# Patient Record
Sex: Female | Born: 1935 | Race: White
Health system: Southern US, Community
[De-identification: ages and names within clinical notes are randomized; demographics above are authoritative.]

## PROBLEM LIST (undated history)

## (undated) DIAGNOSIS — E039 Hypothyroidism, unspecified: Secondary | ICD-10-CM

## (undated) DIAGNOSIS — M1990 Unspecified osteoarthritis, unspecified site: Secondary | ICD-10-CM

## (undated) DIAGNOSIS — M5136 Other intervertebral disc degeneration, lumbar region: Secondary | ICD-10-CM

## (undated) DIAGNOSIS — M19041 Primary osteoarthritis, right hand: Secondary | ICD-10-CM

## (undated) DIAGNOSIS — M19042 Primary osteoarthritis, left hand: Secondary | ICD-10-CM

## (undated) DIAGNOSIS — H409 Unspecified glaucoma: Secondary | ICD-10-CM

## (undated) DIAGNOSIS — Z889 Allergy status to unspecified drugs, medicaments and biological substances status: Secondary | ICD-10-CM

## (undated) DIAGNOSIS — M170 Bilateral primary osteoarthritis of knee: Secondary | ICD-10-CM

## (undated) DIAGNOSIS — M069 Rheumatoid arthritis, unspecified: Secondary | ICD-10-CM

## (undated) DIAGNOSIS — M19072 Primary osteoarthritis, left ankle and foot: Secondary | ICD-10-CM

## (undated) DIAGNOSIS — M353 Polymyalgia rheumatica: Secondary | ICD-10-CM

## (undated) DIAGNOSIS — M19071 Primary osteoarthritis, right ankle and foot: Secondary | ICD-10-CM

## (undated) DIAGNOSIS — I639 Cerebral infarction, unspecified: Secondary | ICD-10-CM

## (undated) DIAGNOSIS — C44301 Unspecified malignant neoplasm of skin of nose: Secondary | ICD-10-CM

## (undated) DIAGNOSIS — D649 Anemia, unspecified: Secondary | ICD-10-CM

## (undated) DIAGNOSIS — E785 Hyperlipidemia, unspecified: Secondary | ICD-10-CM

## (undated) DIAGNOSIS — G459 Transient cerebral ischemic attack, unspecified: Secondary | ICD-10-CM

## (undated) DIAGNOSIS — M797 Fibromyalgia: Secondary | ICD-10-CM

## (undated) DIAGNOSIS — M810 Age-related osteoporosis without current pathological fracture: Secondary | ICD-10-CM

## (undated) DIAGNOSIS — E079 Disorder of thyroid, unspecified: Secondary | ICD-10-CM

## (undated) HISTORY — PX: CHOLECYSTECTOMY: SHX55

## (undated) HISTORY — PX: COLONOSCOPY: SHX174

## (undated) HISTORY — DX: Primary osteoarthritis, right hand: M19.041

## (undated) HISTORY — DX: Primary osteoarthritis, left ankle and foot: M19.072

## (undated) HISTORY — DX: Rheumatoid arthritis, unspecified: M06.9

## (undated) HISTORY — DX: Unspecified osteoarthritis, unspecified site: M19.90

## (undated) HISTORY — PX: APPENDECTOMY: SHX54

## (undated) HISTORY — DX: Bilateral primary osteoarthritis of knee: M17.0

## (undated) HISTORY — DX: Cerebral infarction, unspecified: I63.9

## (undated) HISTORY — DX: Primary osteoarthritis, right ankle and foot: M19.071

## (undated) HISTORY — DX: Other intervertebral disc degeneration, lumbar region: M51.36

## (undated) HISTORY — PX: OTHER SURGICAL HISTORY: SHX169

## (undated) HISTORY — DX: Primary osteoarthritis, left hand: M19.042

## (undated) HISTORY — DX: Unspecified glaucoma: H40.9

---

## 2004-05-03 ENCOUNTER — Ambulatory Visit (HOSPITAL_COMMUNITY): Admission: RE | Admit: 2004-05-03 | Discharge: 2004-05-03 | Payer: Self-pay | Admitting: Rheumatology

## 2004-11-14 ENCOUNTER — Encounter: Admission: RE | Admit: 2004-11-14 | Discharge: 2004-11-14 | Payer: Self-pay | Admitting: Rheumatology

## 2007-02-26 ENCOUNTER — Emergency Department: Admit: 2007-02-26 | Payer: Self-pay | Source: Emergency Department | Admitting: Emergency Medical Services

## 2011-06-18 ENCOUNTER — Other Ambulatory Visit (HOSPITAL_COMMUNITY): Payer: Self-pay | Admitting: *Deleted

## 2011-06-19 ENCOUNTER — Ambulatory Visit (HOSPITAL_COMMUNITY)
Admission: RE | Admit: 2011-06-19 | Discharge: 2011-06-19 | Disposition: A | Discharge status: Home / Self Care | Payer: Medicare Other | Source: Ambulatory Visit | Attending: Rheumatology | Admitting: Rheumatology

## 2011-06-19 DIAGNOSIS — M810 Age-related osteoporosis without current pathological fracture: Secondary | ICD-10-CM | POA: Insufficient documentation

## 2011-06-19 MED ORDER — ZOLEDRONIC ACID 5 MG/100ML IV SOLN
5.0000 mg | Freq: Once | INTRAVENOUS | Status: AC
  Administered 2011-06-19: 5 mg via INTRAVENOUS
  Filled 2011-06-19 (×2): qty 100

## 2012-08-25 ENCOUNTER — Other Ambulatory Visit (HOSPITAL_COMMUNITY): Payer: Self-pay

## 2012-08-26 ENCOUNTER — Encounter (HOSPITAL_COMMUNITY)
Admission: RE | Admit: 2012-08-26 | Discharge: 2012-08-26 | Disposition: A | Discharge status: Home / Self Care | Payer: Medicare Other | Source: Ambulatory Visit | Attending: Rheumatology | Admitting: Rheumatology

## 2012-08-26 DIAGNOSIS — M810 Age-related osteoporosis without current pathological fracture: Secondary | ICD-10-CM | POA: Insufficient documentation

## 2012-08-26 MED ORDER — ZOLEDRONIC ACID 5 MG/100ML IV SOLN
5.0000 mg | Freq: Once | INTRAVENOUS | Status: AC
Start: 2012-08-26 — End: 2012-08-26
  Administered 2012-08-26: 5 mg via INTRAVENOUS

## 2012-08-26 MED ORDER — ZOLEDRONIC ACID 5 MG/100ML IV SOLN
INTRAVENOUS | Status: AC
  Administered 2012-08-26: 5 mg via INTRAVENOUS
  Filled 2012-08-26: qty 100

## 2014-04-15 DIAGNOSIS — Z79899 Other long term (current) drug therapy: Secondary | ICD-10-CM | POA: Diagnosis not present

## 2014-05-18 DIAGNOSIS — M069 Rheumatoid arthritis, unspecified: Secondary | ICD-10-CM | POA: Diagnosis not present

## 2014-05-18 DIAGNOSIS — M1711 Unilateral primary osteoarthritis, right knee: Secondary | ICD-10-CM | POA: Diagnosis not present

## 2014-05-18 DIAGNOSIS — M353 Polymyalgia rheumatica: Secondary | ICD-10-CM | POA: Diagnosis not present

## 2014-05-18 DIAGNOSIS — M7071 Other bursitis of hip, right hip: Secondary | ICD-10-CM | POA: Diagnosis not present

## 2014-05-23 DIAGNOSIS — R002 Palpitations: Secondary | ICD-10-CM | POA: Diagnosis not present

## 2014-06-10 DIAGNOSIS — M069 Rheumatoid arthritis, unspecified: Secondary | ICD-10-CM | POA: Diagnosis not present

## 2014-06-10 DIAGNOSIS — M7071 Other bursitis of hip, right hip: Secondary | ICD-10-CM | POA: Diagnosis not present

## 2014-06-10 DIAGNOSIS — M353 Polymyalgia rheumatica: Secondary | ICD-10-CM | POA: Diagnosis not present

## 2014-06-10 DIAGNOSIS — R2689 Other abnormalities of gait and mobility: Secondary | ICD-10-CM | POA: Diagnosis not present

## 2014-06-10 DIAGNOSIS — M25461 Effusion, right knee: Secondary | ICD-10-CM | POA: Diagnosis not present

## 2014-06-10 DIAGNOSIS — M6281 Muscle weakness (generalized): Secondary | ICD-10-CM | POA: Diagnosis not present

## 2014-06-13 DIAGNOSIS — R2689 Other abnormalities of gait and mobility: Secondary | ICD-10-CM | POA: Diagnosis not present

## 2014-06-13 DIAGNOSIS — M6281 Muscle weakness (generalized): Secondary | ICD-10-CM | POA: Diagnosis not present

## 2014-06-13 DIAGNOSIS — M353 Polymyalgia rheumatica: Secondary | ICD-10-CM | POA: Diagnosis not present

## 2014-06-13 DIAGNOSIS — M25461 Effusion, right knee: Secondary | ICD-10-CM | POA: Diagnosis not present

## 2014-06-13 DIAGNOSIS — M7071 Other bursitis of hip, right hip: Secondary | ICD-10-CM | POA: Diagnosis not present

## 2014-06-13 DIAGNOSIS — M069 Rheumatoid arthritis, unspecified: Secondary | ICD-10-CM | POA: Diagnosis not present

## 2014-06-15 DIAGNOSIS — M6281 Muscle weakness (generalized): Secondary | ICD-10-CM | POA: Diagnosis not present

## 2014-06-15 DIAGNOSIS — M353 Polymyalgia rheumatica: Secondary | ICD-10-CM | POA: Diagnosis not present

## 2014-06-15 DIAGNOSIS — M069 Rheumatoid arthritis, unspecified: Secondary | ICD-10-CM | POA: Diagnosis not present

## 2014-06-15 DIAGNOSIS — M7071 Other bursitis of hip, right hip: Secondary | ICD-10-CM | POA: Diagnosis not present

## 2014-06-15 DIAGNOSIS — R2689 Other abnormalities of gait and mobility: Secondary | ICD-10-CM | POA: Diagnosis not present

## 2014-06-15 DIAGNOSIS — M25461 Effusion, right knee: Secondary | ICD-10-CM | POA: Diagnosis not present

## 2014-06-20 DIAGNOSIS — M6281 Muscle weakness (generalized): Secondary | ICD-10-CM | POA: Diagnosis not present

## 2014-06-20 DIAGNOSIS — R2689 Other abnormalities of gait and mobility: Secondary | ICD-10-CM | POA: Diagnosis not present

## 2014-06-20 DIAGNOSIS — M25461 Effusion, right knee: Secondary | ICD-10-CM | POA: Diagnosis not present

## 2014-06-20 DIAGNOSIS — M353 Polymyalgia rheumatica: Secondary | ICD-10-CM | POA: Diagnosis not present

## 2014-06-20 DIAGNOSIS — M069 Rheumatoid arthritis, unspecified: Secondary | ICD-10-CM | POA: Diagnosis not present

## 2014-06-20 DIAGNOSIS — M7071 Other bursitis of hip, right hip: Secondary | ICD-10-CM | POA: Diagnosis not present

## 2014-06-22 DIAGNOSIS — M353 Polymyalgia rheumatica: Secondary | ICD-10-CM | POA: Diagnosis not present

## 2014-06-22 DIAGNOSIS — R2689 Other abnormalities of gait and mobility: Secondary | ICD-10-CM | POA: Diagnosis not present

## 2014-06-22 DIAGNOSIS — M7071 Other bursitis of hip, right hip: Secondary | ICD-10-CM | POA: Diagnosis not present

## 2014-06-22 DIAGNOSIS — M25461 Effusion, right knee: Secondary | ICD-10-CM | POA: Diagnosis not present

## 2014-06-22 DIAGNOSIS — M069 Rheumatoid arthritis, unspecified: Secondary | ICD-10-CM | POA: Diagnosis not present

## 2014-06-22 DIAGNOSIS — M6281 Muscle weakness (generalized): Secondary | ICD-10-CM | POA: Diagnosis not present

## 2014-06-27 DIAGNOSIS — M6281 Muscle weakness (generalized): Secondary | ICD-10-CM | POA: Diagnosis not present

## 2014-06-27 DIAGNOSIS — M7071 Other bursitis of hip, right hip: Secondary | ICD-10-CM | POA: Diagnosis not present

## 2014-06-27 DIAGNOSIS — M25461 Effusion, right knee: Secondary | ICD-10-CM | POA: Diagnosis not present

## 2014-06-27 DIAGNOSIS — M069 Rheumatoid arthritis, unspecified: Secondary | ICD-10-CM | POA: Diagnosis not present

## 2014-06-27 DIAGNOSIS — M353 Polymyalgia rheumatica: Secondary | ICD-10-CM | POA: Diagnosis not present

## 2014-06-27 DIAGNOSIS — R2689 Other abnormalities of gait and mobility: Secondary | ICD-10-CM | POA: Diagnosis not present

## 2014-06-29 DIAGNOSIS — M069 Rheumatoid arthritis, unspecified: Secondary | ICD-10-CM | POA: Diagnosis not present

## 2014-06-29 DIAGNOSIS — R2689 Other abnormalities of gait and mobility: Secondary | ICD-10-CM | POA: Diagnosis not present

## 2014-06-29 DIAGNOSIS — M6281 Muscle weakness (generalized): Secondary | ICD-10-CM | POA: Diagnosis not present

## 2014-06-29 DIAGNOSIS — M7071 Other bursitis of hip, right hip: Secondary | ICD-10-CM | POA: Diagnosis not present

## 2014-06-29 DIAGNOSIS — M353 Polymyalgia rheumatica: Secondary | ICD-10-CM | POA: Diagnosis not present

## 2014-06-29 DIAGNOSIS — M25461 Effusion, right knee: Secondary | ICD-10-CM | POA: Diagnosis not present

## 2014-07-04 DIAGNOSIS — M069 Rheumatoid arthritis, unspecified: Secondary | ICD-10-CM | POA: Diagnosis not present

## 2014-07-04 DIAGNOSIS — M2550 Pain in unspecified joint: Secondary | ICD-10-CM | POA: Diagnosis not present

## 2014-07-04 DIAGNOSIS — M353 Polymyalgia rheumatica: Secondary | ICD-10-CM | POA: Diagnosis not present

## 2014-07-04 DIAGNOSIS — R2689 Other abnormalities of gait and mobility: Secondary | ICD-10-CM | POA: Diagnosis not present

## 2014-07-04 DIAGNOSIS — Z79899 Other long term (current) drug therapy: Secondary | ICD-10-CM | POA: Diagnosis not present

## 2014-07-04 DIAGNOSIS — M7071 Other bursitis of hip, right hip: Secondary | ICD-10-CM | POA: Diagnosis not present

## 2014-07-04 DIAGNOSIS — M25461 Effusion, right knee: Secondary | ICD-10-CM | POA: Diagnosis not present

## 2014-07-04 DIAGNOSIS — M6281 Muscle weakness (generalized): Secondary | ICD-10-CM | POA: Diagnosis not present

## 2014-07-06 DIAGNOSIS — M353 Polymyalgia rheumatica: Secondary | ICD-10-CM | POA: Diagnosis not present

## 2014-07-06 DIAGNOSIS — M6281 Muscle weakness (generalized): Secondary | ICD-10-CM | POA: Diagnosis not present

## 2014-07-06 DIAGNOSIS — M069 Rheumatoid arthritis, unspecified: Secondary | ICD-10-CM | POA: Diagnosis not present

## 2014-07-06 DIAGNOSIS — M25461 Effusion, right knee: Secondary | ICD-10-CM | POA: Diagnosis not present

## 2014-07-06 DIAGNOSIS — M7071 Other bursitis of hip, right hip: Secondary | ICD-10-CM | POA: Diagnosis not present

## 2014-07-06 DIAGNOSIS — R2689 Other abnormalities of gait and mobility: Secondary | ICD-10-CM | POA: Diagnosis not present

## 2014-07-07 DIAGNOSIS — M069 Rheumatoid arthritis, unspecified: Secondary | ICD-10-CM | POA: Diagnosis not present

## 2014-07-07 DIAGNOSIS — M7071 Other bursitis of hip, right hip: Secondary | ICD-10-CM | POA: Diagnosis not present

## 2014-07-07 DIAGNOSIS — M353 Polymyalgia rheumatica: Secondary | ICD-10-CM | POA: Diagnosis not present

## 2014-07-07 DIAGNOSIS — M6281 Muscle weakness (generalized): Secondary | ICD-10-CM | POA: Diagnosis not present

## 2014-07-07 DIAGNOSIS — R2689 Other abnormalities of gait and mobility: Secondary | ICD-10-CM | POA: Diagnosis not present

## 2014-07-07 DIAGNOSIS — M25461 Effusion, right knee: Secondary | ICD-10-CM | POA: Diagnosis not present

## 2014-07-13 DIAGNOSIS — M069 Rheumatoid arthritis, unspecified: Secondary | ICD-10-CM | POA: Diagnosis not present

## 2014-07-13 DIAGNOSIS — M6281 Muscle weakness (generalized): Secondary | ICD-10-CM | POA: Diagnosis not present

## 2014-07-13 DIAGNOSIS — R2689 Other abnormalities of gait and mobility: Secondary | ICD-10-CM | POA: Diagnosis not present

## 2014-07-13 DIAGNOSIS — M353 Polymyalgia rheumatica: Secondary | ICD-10-CM | POA: Diagnosis not present

## 2014-07-13 DIAGNOSIS — M7071 Other bursitis of hip, right hip: Secondary | ICD-10-CM | POA: Diagnosis not present

## 2014-07-13 DIAGNOSIS — M25461 Effusion, right knee: Secondary | ICD-10-CM | POA: Diagnosis not present

## 2014-07-15 DIAGNOSIS — M069 Rheumatoid arthritis, unspecified: Secondary | ICD-10-CM | POA: Diagnosis not present

## 2014-07-15 DIAGNOSIS — M7071 Other bursitis of hip, right hip: Secondary | ICD-10-CM | POA: Diagnosis not present

## 2014-07-15 DIAGNOSIS — M25461 Effusion, right knee: Secondary | ICD-10-CM | POA: Diagnosis not present

## 2014-07-15 DIAGNOSIS — R2689 Other abnormalities of gait and mobility: Secondary | ICD-10-CM | POA: Diagnosis not present

## 2014-07-15 DIAGNOSIS — M6281 Muscle weakness (generalized): Secondary | ICD-10-CM | POA: Diagnosis not present

## 2014-07-15 DIAGNOSIS — M353 Polymyalgia rheumatica: Secondary | ICD-10-CM | POA: Diagnosis not present

## 2014-07-20 DIAGNOSIS — Z79899 Other long term (current) drug therapy: Secondary | ICD-10-CM | POA: Diagnosis not present

## 2014-07-20 DIAGNOSIS — M069 Rheumatoid arthritis, unspecified: Secondary | ICD-10-CM | POA: Diagnosis not present

## 2014-08-11 ENCOUNTER — Ambulatory Visit: Payer: Medicare Other | Admitting: Podiatrist

## 2014-08-12 ENCOUNTER — Encounter: Payer: Self-pay | Admitting: Podiatrist

## 2014-08-12 ENCOUNTER — Ambulatory Visit (INDEPENDENT_AMBULATORY_CARE_PROVIDER_SITE_OTHER): Payer: Medicare Other | Admitting: Podiatrist

## 2014-08-12 DIAGNOSIS — M216X9 Other acquired deformities of unspecified foot: Secondary | ICD-10-CM

## 2014-08-12 DIAGNOSIS — M2040 Other hammer toe(s) (acquired), unspecified foot: Secondary | ICD-10-CM

## 2014-08-12 DIAGNOSIS — L603 Nail dystrophy: Secondary | ICD-10-CM | POA: Diagnosis not present

## 2014-08-12 NOTE — Patient Instructions (Signed)
Continue to wear a supportive and comfortable shoe with lots of cushioning in the forefoot for the fat pad atrophy that has occurred.  Hammertoes are from a stronger tendon taking over the strength of a weaker tendon and it appears this is happening with your toes.  They can be corrected surgically if they become very uncomfortable.

## 2014-08-12 NOTE — Progress Notes (Signed)
   Subjective:    Patient ID: Beverly Estrada, female    DOB: 09-09-1929, 81 y.o.   MRN: [id number]  HPI I AM HERE TO HAVE MY FEET CHECKED AND I HAVE PRESSURE POINTS AND MY FEET ARE NUMB AND I HAVE A PLACE ON THE BALL OF MY RIGHT FOOT AND THEY ARE SORE AND THERE IS NO SWELLING OR COLDNESS    Review of Systems  Constitutional: Positive for fatigue.  Gastrointestinal:       TROUBLE HOLDING WATER IF I DON'T HURRY  Hematological: Bruises/bleeds easily.  All other systems reviewed and are negative.      Objective:   Physical Exam   Objective:     Vascular status reveals pedal pulses noted at  2 out of 4 dp and pt bilateral .  Neurological sensation is Decreased to [employer] monofilament bilateral at 3/5 sites bilateral.  Dermatological exam reveals  presence of  A very small discreet lesion is present submet 4 of the right foot. No depth to the lesion is seen. It appears to be a superficial callus.  Hammertoe contracture is also present bilateral.  Fat pad atrophy noted bilateral as well.            Assessment & Plan:  Diabetes with neuropathy, fat pad atrophy, hammertoe deformity, hyperkeratotic lesion right foot.   Plan:  Discussed diabetes and foot care.  Recommended more supportive shoes,  Debridement of lesion accomplished.

## 2014-08-24 DIAGNOSIS — M069 Rheumatoid arthritis, unspecified: Secondary | ICD-10-CM | POA: Diagnosis not present

## 2014-08-24 DIAGNOSIS — M353 Polymyalgia rheumatica: Secondary | ICD-10-CM | POA: Diagnosis not present

## 2014-08-24 DIAGNOSIS — Z79899 Other long term (current) drug therapy: Secondary | ICD-10-CM | POA: Diagnosis not present

## 2014-08-24 DIAGNOSIS — R5383 Other fatigue: Secondary | ICD-10-CM | POA: Diagnosis not present

## 2014-09-01 DIAGNOSIS — R5381 Other malaise: Secondary | ICD-10-CM | POA: Diagnosis not present

## 2014-09-01 DIAGNOSIS — Z79899 Other long term (current) drug therapy: Secondary | ICD-10-CM | POA: Diagnosis not present

## 2014-09-01 DIAGNOSIS — M2550 Pain in unspecified joint: Secondary | ICD-10-CM | POA: Diagnosis not present

## 2014-09-01 DIAGNOSIS — E559 Vitamin D deficiency, unspecified: Secondary | ICD-10-CM | POA: Diagnosis not present

## 2014-09-13 DIAGNOSIS — F339 Major depressive disorder, recurrent, unspecified: Secondary | ICD-10-CM | POA: Diagnosis not present

## 2014-09-13 DIAGNOSIS — F5104 Psychophysiologic insomnia: Secondary | ICD-10-CM | POA: Diagnosis not present

## 2014-09-13 DIAGNOSIS — E785 Hyperlipidemia, unspecified: Secondary | ICD-10-CM | POA: Diagnosis not present

## 2014-09-13 DIAGNOSIS — R32 Unspecified urinary incontinence: Secondary | ICD-10-CM | POA: Diagnosis not present

## 2014-09-13 DIAGNOSIS — M0600 Rheumatoid arthritis without rheumatoid factor, unspecified site: Secondary | ICD-10-CM | POA: Diagnosis not present

## 2014-09-13 DIAGNOSIS — K635 Polyp of colon: Secondary | ICD-10-CM | POA: Diagnosis not present

## 2014-09-13 DIAGNOSIS — E119 Type 2 diabetes mellitus without complications: Secondary | ICD-10-CM | POA: Diagnosis not present

## 2014-09-13 DIAGNOSIS — N183 Chronic kidney disease, stage 3 (moderate): Secondary | ICD-10-CM | POA: Diagnosis not present

## 2014-09-13 DIAGNOSIS — Z23 Encounter for immunization: Secondary | ICD-10-CM | POA: Diagnosis not present

## 2014-09-13 DIAGNOSIS — E039 Hypothyroidism, unspecified: Secondary | ICD-10-CM | POA: Diagnosis not present

## 2014-09-13 DIAGNOSIS — M353 Polymyalgia rheumatica: Secondary | ICD-10-CM | POA: Diagnosis not present

## 2014-09-13 DIAGNOSIS — Z79899 Other long term (current) drug therapy: Secondary | ICD-10-CM | POA: Diagnosis not present

## 2014-09-13 DIAGNOSIS — L900 Lichen sclerosus et atrophicus: Secondary | ICD-10-CM | POA: Diagnosis not present

## 2014-10-11 DIAGNOSIS — M2550 Pain in unspecified joint: Secondary | ICD-10-CM | POA: Diagnosis not present

## 2014-10-11 DIAGNOSIS — Z9225 Personal history of immunosupression therapy: Secondary | ICD-10-CM | POA: Diagnosis not present

## 2014-11-23 DIAGNOSIS — M069 Rheumatoid arthritis, unspecified: Secondary | ICD-10-CM | POA: Diagnosis not present

## 2014-11-23 DIAGNOSIS — Z09 Encounter for follow-up examination after completed treatment for conditions other than malignant neoplasm: Secondary | ICD-10-CM | POA: Diagnosis not present

## 2014-11-23 DIAGNOSIS — M25511 Pain in right shoulder: Secondary | ICD-10-CM | POA: Diagnosis not present

## 2014-11-23 DIAGNOSIS — M353 Polymyalgia rheumatica: Secondary | ICD-10-CM | POA: Diagnosis not present

## 2014-11-23 DIAGNOSIS — M1711 Unilateral primary osteoarthritis, right knee: Secondary | ICD-10-CM | POA: Diagnosis not present

## 2014-12-28 DIAGNOSIS — Z79899 Other long term (current) drug therapy: Secondary | ICD-10-CM | POA: Diagnosis not present

## 2015-01-05 DIAGNOSIS — Z1231 Encounter for screening mammogram for malignant neoplasm of breast: Secondary | ICD-10-CM | POA: Diagnosis not present

## 2015-01-18 DIAGNOSIS — Z79899 Other long term (current) drug therapy: Secondary | ICD-10-CM | POA: Diagnosis not present

## 2015-01-18 DIAGNOSIS — M069 Rheumatoid arthritis, unspecified: Secondary | ICD-10-CM | POA: Diagnosis not present

## 2015-02-08 DIAGNOSIS — H903 Sensorineural hearing loss, bilateral: Secondary | ICD-10-CM | POA: Diagnosis not present

## 2015-02-13 DIAGNOSIS — E119 Type 2 diabetes mellitus without complications: Secondary | ICD-10-CM | POA: Diagnosis not present

## 2015-02-13 DIAGNOSIS — N183 Chronic kidney disease, stage 3 (moderate): Secondary | ICD-10-CM | POA: Diagnosis not present

## 2015-02-13 DIAGNOSIS — M353 Polymyalgia rheumatica: Secondary | ICD-10-CM | POA: Diagnosis not present

## 2015-02-13 DIAGNOSIS — F5104 Psychophysiologic insomnia: Secondary | ICD-10-CM | POA: Diagnosis not present

## 2015-02-13 DIAGNOSIS — E785 Hyperlipidemia, unspecified: Secondary | ICD-10-CM | POA: Diagnosis not present

## 2015-02-13 DIAGNOSIS — E039 Hypothyroidism, unspecified: Secondary | ICD-10-CM | POA: Diagnosis not present

## 2015-02-13 DIAGNOSIS — F339 Major depressive disorder, recurrent, unspecified: Secondary | ICD-10-CM | POA: Diagnosis not present

## 2015-02-13 DIAGNOSIS — M0600 Rheumatoid arthritis without rheumatoid factor, unspecified site: Secondary | ICD-10-CM | POA: Diagnosis not present

## 2015-03-12 DIAGNOSIS — R1010 Upper abdominal pain, unspecified: Secondary | ICD-10-CM | POA: Diagnosis not present

## 2015-03-12 DIAGNOSIS — R109 Unspecified abdominal pain: Secondary | ICD-10-CM | POA: Diagnosis not present

## 2015-03-12 DIAGNOSIS — K8041 Calculus of bile duct with cholecystitis, unspecified, with obstruction: Secondary | ICD-10-CM | POA: Diagnosis not present

## 2015-03-12 DIAGNOSIS — R1013 Epigastric pain: Secondary | ICD-10-CM | POA: Diagnosis not present

## 2015-03-13 ENCOUNTER — Encounter (HOSPITAL_COMMUNITY): Payer: Self-pay | Admitting: Internal Medicine

## 2015-03-13 ENCOUNTER — Inpatient Hospital Stay (HOSPITAL_COMMUNITY)
Admission: AD | Admit: 2015-03-13 | Discharge: 2015-03-15 | DRG: 446 | Disposition: A | Discharge status: Home / Self Care | Payer: Medicare Other | Source: Other Acute Inpatient Hospital | Attending: Internal Medicine | Admitting: Internal Medicine

## 2015-03-13 DIAGNOSIS — E039 Hypothyroidism, unspecified: Secondary | ICD-10-CM | POA: Diagnosis present

## 2015-03-13 DIAGNOSIS — R109 Unspecified abdominal pain: Secondary | ICD-10-CM | POA: Diagnosis not present

## 2015-03-13 DIAGNOSIS — Z7952 Long term (current) use of systemic steroids: Secondary | ICD-10-CM

## 2015-03-13 DIAGNOSIS — K8041 Calculus of bile duct with cholecystitis, unspecified, with obstruction: Secondary | ICD-10-CM | POA: Diagnosis not present

## 2015-03-13 DIAGNOSIS — R1011 Right upper quadrant pain: Secondary | ICD-10-CM | POA: Diagnosis not present

## 2015-03-13 DIAGNOSIS — E119 Type 2 diabetes mellitus without complications: Secondary | ICD-10-CM | POA: Diagnosis present

## 2015-03-13 DIAGNOSIS — K8050 Calculus of bile duct without cholangitis or cholecystitis without obstruction: Secondary | ICD-10-CM | POA: Diagnosis not present

## 2015-03-13 DIAGNOSIS — Z86010 Personal history of colonic polyps: Secondary | ICD-10-CM | POA: Diagnosis not present

## 2015-03-13 DIAGNOSIS — R948 Abnormal results of function studies of other organs and systems: Secondary | ICD-10-CM | POA: Diagnosis not present

## 2015-03-13 DIAGNOSIS — Z79899 Other long term (current) drug therapy: Secondary | ICD-10-CM | POA: Diagnosis not present

## 2015-03-13 DIAGNOSIS — M25561 Pain in right knee: Secondary | ICD-10-CM | POA: Diagnosis present

## 2015-03-13 DIAGNOSIS — M25562 Pain in left knee: Secondary | ICD-10-CM | POA: Diagnosis present

## 2015-03-13 DIAGNOSIS — M353 Polymyalgia rheumatica: Secondary | ICD-10-CM | POA: Diagnosis present

## 2015-03-13 DIAGNOSIS — K838 Other specified diseases of biliary tract: Secondary | ICD-10-CM | POA: Diagnosis not present

## 2015-03-13 DIAGNOSIS — Z880 Allergy status to penicillin: Secondary | ICD-10-CM | POA: Diagnosis not present

## 2015-03-13 DIAGNOSIS — Z833 Family history of diabetes mellitus: Secondary | ICD-10-CM

## 2015-03-13 DIAGNOSIS — D649 Anemia, unspecified: Secondary | ICD-10-CM | POA: Diagnosis present

## 2015-03-13 DIAGNOSIS — Z823 Family history of stroke: Secondary | ICD-10-CM

## 2015-03-13 DIAGNOSIS — Z7982 Long term (current) use of aspirin: Secondary | ICD-10-CM | POA: Diagnosis not present

## 2015-03-13 DIAGNOSIS — R932 Abnormal findings on diagnostic imaging of liver and biliary tract: Secondary | ICD-10-CM

## 2015-03-13 DIAGNOSIS — M1990 Unspecified osteoarthritis, unspecified site: Secondary | ICD-10-CM | POA: Diagnosis not present

## 2015-03-13 DIAGNOSIS — Z881 Allergy status to other antibiotic agents status: Secondary | ICD-10-CM | POA: Diagnosis not present

## 2015-03-13 DIAGNOSIS — R1013 Epigastric pain: Secondary | ICD-10-CM | POA: Diagnosis not present

## 2015-03-13 DIAGNOSIS — Z888 Allergy status to other drugs, medicaments and biological substances status: Secondary | ICD-10-CM | POA: Diagnosis not present

## 2015-03-13 HISTORY — DX: Unspecified malignant neoplasm of skin of nose: C44.301

## 2015-03-13 HISTORY — DX: Polymyalgia rheumatica: M35.3

## 2015-03-13 HISTORY — DX: Hypothyroidism, unspecified: E03.9

## 2015-03-13 HISTORY — DX: Allergy status to unspecified drugs, medicaments and biological substances: Z88.9

## 2015-03-13 LAB — BASIC METABOLIC PANEL
Anion gap: 5 (ref 5–15)
BUN: 11 mg/dL (ref 6–20)
CO2: 26 mmol/L (ref 22–32)
Calcium: 8.9 mg/dL (ref 8.9–10.3)
Chloride: 108 mmol/L (ref 101–111)
Creatinine, Ser: 0.85 mg/dL (ref 0.44–1.00)
GFR calc Af Amer: >60 mL/min (ref >60)
GFR calc non Af Amer: >60 mL/min (ref >60)
Glucose, Bld: 99 mg/dL (ref 65–99)
Potassium: 3.7 mmol/L (ref 3.5–5.1)
Sodium: 139 mmol/L (ref 135–145)

## 2015-03-13 LAB — CBC WITH DIFFERENTIAL/PLATELET
Basophils Absolute: 0 10*3/uL (ref 0.0–0.1)
Basophils Relative: 0 %
Eosinophils Absolute: 0 10*3/uL (ref 0.0–0.7)
Eosinophils Relative: 0 %
HCT: 31.1 % — ABNORMAL LOW (ref 36.0–46.0)
Hemoglobin: 10.4 g/dL — ABNORMAL LOW (ref 12.0–15.0)
Lymphocytes Relative: 27 %
Lymphs Abs: 2.2 10*3/uL (ref 0.7–4.0)
MCH: 31.5 pg (ref 26.0–34.0)
MCHC: 33.4 g/dL (ref 30.0–36.0)
MCV: 94.2 fL (ref 78.0–100.0)
Monocytes Absolute: 1.5 10*3/uL — ABNORMAL HIGH (ref 0.1–1.0)
Monocytes Relative: 19 %
Neutro Abs: 4.4 10*3/uL (ref 1.7–7.7)
Neutrophils Relative %: 54 %
Platelets: 158 10*3/uL (ref 150–400)
RBC: 3.3 MIL/uL — ABNORMAL LOW (ref 3.87–5.11)
RDW: 14.8 % (ref 11.5–15.5)
WBC: 8.2 10*3/uL (ref 4.0–10.5)

## 2015-03-13 LAB — TROPONIN I: Troponin I: <0.03 ng/mL (ref <0.031)

## 2015-03-13 LAB — HEPATIC FUNCTION PANEL
ALT: 109 U/L — ABNORMAL HIGH (ref 14–54)
AST: 114 U/L — ABNORMAL HIGH (ref 15–41)
Albumin: 3.3 g/dL — ABNORMAL LOW (ref 3.5–5.0)
Alkaline Phosphatase: 52 U/L (ref 38–126)
Bilirubin, Direct: 0.2 mg/dL (ref 0.1–0.5)
Indirect Bilirubin: 0.9 mg/dL (ref 0.3–0.9)
Total Bilirubin: 1.1 mg/dL (ref 0.3–1.2)
Total Protein: 6 g/dL — ABNORMAL LOW (ref 6.5–8.1)

## 2015-03-13 LAB — LIPASE, BLOOD: Lipase: 26 U/L (ref 11–51)

## 2015-03-13 MED ORDER — ACETAMINOPHEN 650 MG RE SUPP: 650.0000 mg | Freq: Four times a day (QID) | RECTAL | Status: DC | PRN

## 2015-03-13 MED ORDER — ONDANSETRON HCL 4 MG PO TABS: 4.0000 mg | ORAL_TABLET | Freq: Four times a day (QID) | ORAL | Status: DC | PRN

## 2015-03-13 MED ORDER — SODIUM CHLORIDE 0.9 % IV SOLN
500.0000 mg | Freq: Three times a day (TID) | INTRAVENOUS | Status: DC
  Administered 2015-03-13 – 2015-03-14 (×3): 500 mg via INTRAVENOUS
  Filled 2015-03-13 (×5): qty 500

## 2015-03-13 MED ORDER — ACETAMINOPHEN 325 MG PO TABS
650.0000 mg | ORAL_TABLET | Freq: Four times a day (QID) | ORAL | Status: DC | PRN
Start: 2015-03-13 — End: 2015-03-15

## 2015-03-13 MED ORDER — LEVOTHYROXINE SODIUM 100 MCG IV SOLR
66.0000 ug | Freq: Every day | INTRAVENOUS | Status: DC
  Administered 2015-03-13 – 2015-03-15 (×3): 66 ug via INTRAVENOUS
  Filled 2015-03-13 (×3): qty 5

## 2015-03-13 MED ORDER — MORPHINE SULFATE (PF) 2 MG/ML IV SOLN: 1.0000 mg | INTRAVENOUS | Status: DC | PRN

## 2015-03-13 MED ORDER — HYDROCORTISONE NA SUCCINATE PF 100 MG IJ SOLR
50.0000 mg | Freq: Two times a day (BID) | INTRAMUSCULAR | Status: DC
  Administered 2015-03-13 – 2015-03-15 (×4): 50 mg via INTRAVENOUS
  Filled 2015-03-13 (×4): qty 2

## 2015-03-13 MED ORDER — SODIUM CHLORIDE 0.9 % IV SOLN
INTRAVENOUS | Status: AC
  Administered 2015-03-13: 22:00:00 via INTRAVENOUS
  Administered 2015-03-14: 1000 mL via INTRAVENOUS

## 2015-03-13 MED ORDER — ONDANSETRON HCL 4 MG/2ML IJ SOLN
4.0000 mg | Freq: Four times a day (QID) | INTRAMUSCULAR | Status: DC | PRN
Start: 2015-03-13 — End: 2015-03-15

## 2015-03-13 MED ORDER — IMIPENEM-CILASTATIN 500 MG IV SOLR
500.0000 mg | Freq: Once | INTRAVENOUS | Status: DC
  Filled 2015-03-13: qty 500

## 2015-03-13 NOTE — Progress Notes (Signed)
Sign-out  Patient is an 81 y/o female, present to [facility] with complains of abdominal pain. Further work up revealed CBD stone, with labs showing Bili of 1.9. Their GI recommended ERCP which could not be done that that facility for which they have requested transfer to [facility]. They spoke with Dr Carlean Purl of GI who agreed to see patient in consultation. Patient accepted to medsurg.

## 2015-03-13 NOTE — H&P (Addendum)
[facility] History and Physical  Beverly Estrada [id number] DOB: December 28, 1929 DOA: 03/13/2015  Referring physician: Patient was transferred from [facility]. PCP: Beverly Rile, MD  Specialists: None.  Chief Complaint: Abdominal pain.  HPI: Beverly Estrada is a 81 y.o. female with history of hypothyroidism and polymyalgia rheumatica presented to the ER at [facility] because of abdominal pain. Patient presented to the ER yesterday. Patient's pain started 2 days ago. It worsened yesterday. Pain is mostly in the epigastric area and right upper quadrant which became severe and diffuse. Had nausea and fever and chills. CT abdomen and pelvis done in the hospital showed distal common bile duct stone obstructing. LFTs were mildly elevated. CBC showed elevated WBC count. Since patient may require ERCP patient was transferred to [facility] after discussing with gastroenterologist Dr. Carlean Purl. On exam patient states her abdominal pain has largely improved at this time. On exam patient has mild tenderness in the right upper quadrant and epigastric area. Patient has been admitted for further management of choledocholithiasis. CT scan done at [facility] shows 6 mm obstructing stone in the distal CBD with mild pericholecystic fluid. Denies any chest pain or shortness of breath.   Review of Systems: As presented in the history of presenting illness, rest negative.  Past Medical History  Diagnosis Date  . Glaucoma   . Arthritis   . Thyroid disease   . Cancer ([place])   . Polymyalgia rheumatica (HCC)    Past Surgical History  Procedure Laterality Date  . Tonsil surgery    . Appendectomy     Social History:  reports that she has never smoked. She has never used smokeless tobacco. She reports that she does not drink alcohol or use illicit drugs. Where does patient live home. Can patient participate in ADLs? Yes.  Allergies  Allergen Reactions  . Biaxin  [Clarithromycin]   . Clarithromycin Other (See Comments)    Tasted so bad could sleep from swallowing  . Amoxicillin-Pot Clavulanate Rash  . Clindamycin/Lincomycin Rash    Family History:  Family History  Problem Relation Age of Onset  . Diabetes Mellitus II Mother   . Stroke Mother   . Diabetes Mellitus II Brother   . Stroke Brother       Prior to Admission medications   Medication Sig Start Date End Date Taking? Authorizing Provider  BAYER CONTOUR TEST test strip daily. for testing 07/19/14   Historical Provider, MD  hydroxychloroquine (PLAQUENIL) 200 MG tablet  08/01/14   Historical Provider, MD  levothyroxine (SYNTHROID, LEVOTHROID) 112 MCG tablet  08/04/14   Historical Provider, MD  methotrexate (RHEUMATREX) 2.5 MG tablet  07/20/14   Historical Provider, MD  Multiple Vitamin (MULTIVITAMIN) tablet Take 1 tablet by mouth daily.    Historical Provider, MD  predniSONE (DELTASONE) 1 MG tablet  08/01/14   Historical Provider, MD    Physical Exam: Filed Vitals:   03/13/15 1900  BP: 136/48  Pulse: 71  Temp: 99 F (37.2 C)  TempSrc: Oral  Resp: 18  Height: 5\' 5"  (1.651 m)  Weight: 70.398 kg (155 lb 3.2 oz)     General:  Moderately built and nourished.  Eyes: Anicteric no pallor.  ENT: No discharge from ears eyes nose and mouth.  Neck: No mass felt.  Cardiovascular: S1-S2 heard.  Respiratory: No rhonchi or crepitations.  Abdomen: Soft mild tenderness in the right upper quadrant and epigastric area. No guarding or rigidity.  Skin: No rash.  Musculoskeletal: No edema.  Psychiatric: Appears  normal.  Neurologic: Alert awake oriented to time place and person. Moves all extremities.  Labs on Admission:  Basic Metabolic Panel: No results for input(s): NA, K, CL, CO2, GLUCOSE, BUN, CREATININE, CALCIUM, MG, PHOS in the last 168 hours. Liver Function Tests: No results for input(s): AST, ALT, ALKPHOS, BILITOT, PROT, ALBUMIN in the last 168 hours. No results for input(s):  LIPASE, AMYLASE in the last 168 hours. No results for input(s): AMMONIA in the last 168 hours. CBC: No results for input(s): WBC, NEUTROABS, HGB, HCT, MCV, PLT in the last 168 hours. Cardiac Enzymes: No results for input(s): CKTOTAL, CKMB, CKMBINDEX, TROPONINI in the last 168 hours.  BNP (last 3 results) No results for input(s): BNP in the last 8760 hours.  ProBNP (last 3 results) No results for input(s): PROBNP in the last 8760 hours.  CBG: No results for input(s): GLUCAP in the last 168 hours.  Radiological Exams on Admission: No results found.   Assessment/Plan Principal Problem:   Choledocholithiasis Active Problems:   Hypothyroidism   Polymyalgia rheumatica ([place])   1. Choledocholithiasis - CT abdomen and pelvis shows dilated common bile duct at 1.1 cm with 6 mm stone distally at the duodenal ampulla with mild apparent pericholecystic fluid. No definite evidence of acute cholecystitis. At this time since patient has significant leukocytosis but the labs done at [facility] showing WBC of 24,000 and patient stating patient had fever and chills I have placed patient on Primaxin. Dr. Carlean Purl was consulted prior to transfer. Patient will be kept nothing by mouth and IV fluids and pain relieving medications. Further recommendations per gastroenterologist. Since there is pericholecystic fluid I will also order HIDA scan to rule out acute cholecystitis. 2. Polymyalgia rheumatica - since patient has been on long-term steroids have placed patient on IV hydrocortisone for now. 3. Hypothyroidism - IV Synthroid until patient can take orally. 4. History of diabetes mellitus on diet - closely follow CBGs. 5. Normocytic normochromic anemia - follow CBC. 6. Nonspecific scattered hypodensities in the liver which will need further workup as outpatient.  Labs done at [facility] showed WBC of 24.3 hemoglobin of 11.8 platelets of 176 blood chewable 143. Bilirubin 1.3 AST 174 ALT 71  troponin less than 0.01 and alkaline phosphatase 78 creatinine 0.7 urine was negative for nitrites and leukocyte esterase.   DVT Prophylaxis SCDs.  Code Status: Full code.  Family Communication: Discussed with patient.  Disposition Plan: Admit to inpatient.    Kemisha Bonnette N. [facility] Pager [phone].  If 7PM-7AM, please contact night-coverage [email or web address] Password [facility] 03/13/2015, 8:58 PM

## 2015-03-13 NOTE — Progress Notes (Addendum)
ANTIBIOTIC CONSULT NOTE - INITIAL  Pharmacy Consult for imipenem Indication: intra-abdominal infection  Allergies  Allergen Reactions  . Biaxin [Clarithromycin]   . Clarithromycin Other (See Comments)    Tasted Kieran Nachtigal bad could sleep from swallowing  . Amoxicillin-Pot Clavulanate Rash  . Clindamycin/Lincomycin Rash    Patient Measurements: Height: 5\' 5"  (165.1 cm) Weight: 155 lb 3.2 oz (70.398 kg) IBW/kg (Calculated) : 57 Adjusted Body Weight:   Vital Signs: Temp: 99 F (37.2 C) (12/12 1900) Temp Source: Oral (12/12 1900) BP: 136/48 mmHg (12/12 1900) Pulse Rate: 71 (12/12 1900) Intake/Output from previous day:   Intake/Output from this shift:    Labs: No results for input(s): WBC, HGB, PLT, LABCREA, CREATININE in the last 72 hours. CrCl cannot be calculated (Patient has no serum creatinine result on file.). No results for input(s): VANCOTROUGH, VANCOPEAK, VANCORANDOM, GENTTROUGH, GENTPEAK, GENTRANDOM, TOBRATROUGH, TOBRAPEAK, TOBRARND, AMIKACINPEAK, AMIKACINTROU, AMIKACIN in the last 72 hours.   Microbiology: No results found for this or any previous visit (from the past 720 hour(s)).  Medical History: Past Medical History  Diagnosis Date  . Glaucoma   . Arthritis   . Thyroid disease   . Cancer ([place])   . Polymyalgia rheumatica (HCC)     Medications:  Scheduled:  . hydrocortisone sod succinate (SOLU-CORTEF) inj  50 mg Intravenous Q12H  . levothyroxine  66 mcg Intravenous Daily   Infusions:  . sodium chloride     Assessment: 81 yo female with intra-abdominal infection will be started on imipenem.  SCr 0.85 (CrCl ~49)  Goal of Therapy:  Resolution of infection   Plan:  - imipenem 500 mg iv q8h - follow up renal function to decide further dosing  Abbagale Goguen, Tsz-Yin 03/13/2015,9:00 PM

## 2015-03-13 NOTE — Progress Notes (Addendum)
Patient trasfered from [facility] via [place] to room [phone] ; alert and oriented x 4; patient verbalized having sore on right side of abdomen (1/10) ; IV in RAC running fluids at 75cc/hr. Orient patient to room and unit; instructed how to use the call bell and  fall risk precautions. MD notified that patient arrived on the floor. Will continue to monitor the patient.

## 2015-03-14 ENCOUNTER — Inpatient Hospital Stay (HOSPITAL_COMMUNITY): Payer: Medicare Other

## 2015-03-14 ENCOUNTER — Encounter (HOSPITAL_COMMUNITY): Payer: Self-pay | Admitting: Physician Assistant

## 2015-03-14 DIAGNOSIS — K838 Other specified diseases of biliary tract: Secondary | ICD-10-CM

## 2015-03-14 DIAGNOSIS — R932 Abnormal findings on diagnostic imaging of liver and biliary tract: Secondary | ICD-10-CM

## 2015-03-14 LAB — GLUCOSE, CAPILLARY
Glucose-Capillary: 107 mg/dL — ABNORMAL HIGH (ref 65–99)
Glucose-Capillary: 128 mg/dL — ABNORMAL HIGH (ref 65–99)
Glucose-Capillary: 76 mg/dL (ref 65–99)
Glucose-Capillary: 78 mg/dL (ref 65–99)
Glucose-Capillary: 92 mg/dL (ref 65–99)
Glucose-Capillary: 99 mg/dL (ref 65–99)

## 2015-03-14 LAB — HEPATIC FUNCTION PANEL
ALT: 91 U/L — ABNORMAL HIGH (ref 14–54)
AST: 88 U/L — ABNORMAL HIGH (ref 15–41)
Albumin: 3.2 g/dL — ABNORMAL LOW (ref 3.5–5.0)
Alkaline Phosphatase: 49 U/L (ref 38–126)
Bilirubin, Direct: 0.2 mg/dL (ref 0.1–0.5)
Indirect Bilirubin: 0.9 mg/dL (ref 0.3–0.9)
Total Bilirubin: 1.1 mg/dL (ref 0.3–1.2)
Total Protein: 6 g/dL — ABNORMAL LOW (ref 6.5–8.1)

## 2015-03-14 MED ORDER — TECHNETIUM TC 99M MEBROFENIN IV KIT
5.2000 | PACK | Freq: Once | INTRAVENOUS | Status: AC | PRN
  Administered 2015-03-14: 5 via INTRAVENOUS

## 2015-03-14 MED ORDER — GADOBENATE DIMEGLUMINE 529 MG/ML IV SOLN
14.0000 mL | Freq: Once | INTRAVENOUS | Status: AC | PRN
  Administered 2015-03-14: 14 mL via INTRAVENOUS

## 2015-03-14 MED ORDER — ENOXAPARIN SODIUM 40 MG/0.4ML ~~LOC~~ SOLN
40.0000 mg | SUBCUTANEOUS | Status: DC
  Administered 2015-03-14: 40 mg via SUBCUTANEOUS
  Filled 2015-03-14: qty 0.4

## 2015-03-14 NOTE — Progress Notes (Signed)
[facility] PROGRESS NOTE  Beverly Estrada [id number] DOB: [birth year] DOA: 03/13/2015 PCP: Gilford Rile, MD  Length of stay: 1   Assessment/Plan: Principal Problem:   Choledocholithiasis Active Problems:   Hypothyroidism   Polymyalgia rheumatica ([place])    81 y.o. female with history of hypothyroidism and polymyalgia rheumatica presented to the ER at [facility] because of abdominal pain. Patient presented to the ER yesterday. Patient's pain started 2 days ago. It worsened yesterday. Pain is mostly in the epigastric area and right upper quadrant which became severe and diffuse. Had nausea and fever and chills. CT abdomen and pelvis done in the hospital showed distal common bile duct stone obstructing. LFTs were mildly elevated. CBC showed elevated WBC count. Since patient may require ERCP patient was transferred to [facility] after discussing with gastroenterologist Dr. Carlean Purl. On exam patient states her abdominal pain has largely improved at this time. On exam patient has mild tenderness in the right upper quadrant and epigastric area. Patient has been admitted for further management of choledocholithiasis. CT scan done at [facility] shows 6 mm obstructing stone in the distal CBD with mild pericholecystic fluid. Denies any chest pain or shortness of breath.    Assessment and plan  1. Choledocholithiasis - CT abdomen and pelvis shows dilated common bile duct at 1.1 cm with 6 mm stone distally at the duodenal ampulla with mild apparent pericholecystic fluid. No definite evidence of acute cholecystitis. At this time since patient has significant leukocytosis but the labs done at [facility] showing WBC of 24,000 and patient stating patient had fever and chills ,on Primaxin. Dr. Carlean Purl was consulted prior to transfer, they want [facility] to consult . NPO and on  IV fluids and pain relieving medications. Further recommendations per gastroenterologist. Since  there is pericholecystic fluid , now has HIDA scan pending  to rule out acute cholecystitis. Liver function improving 2. Polymyalgia rheumatica - since patient has been on long-term steroids have placed patient on IV hydrocortisone for now. 3. Hypothyroidism - IV Synthroid until patient can take orally. 4. History of diabetes mellitus on diet - closely follow CBGs. 5. Normocytic normochromic anemia - follow CBC. 6. Nonspecific scattered hypodensities in the liver which will need further workup as outpatient.  Labs done at [facility] showed WBC of 24.3 hemoglobin of 11.8 platelets of 176 blood chewable 143. Bilirubin 1.3 AST 174 ALT 71 troponin less than 0.01 and alkaline phosphatase 78 creatinine 0.7 urine was negative for nitrites and leukocyte esterase   DVT prophylaxsis lovenox   Code Status:      Code Status Orders        Start     Ordered   03/13/15 2057  Full code   Continuous     03/13/15 2057      Family Communication: Discussed in detail with the patient, all imaging results, lab results explained to the patient   Disposition Plan:  As above      Consultants:  GI   Procedures:  None   Antibiotics: Anti-infectives    Start     Dose/Rate Route Frequency Ordered Stop   03/13/15 2230  imipenem-cilastatin (PRIMAXIN) 500 mg in sodium chloride 0.9 % 100 mL IVPB     500 mg 200 mL/hr over 30 Minutes Intravenous Every 8 hours 03/13/15 2211     03/13/15 2200  imipenem-cilastatin (PRIMAXIN) 500 mg in sodium chloride 0.9 % 100 mL IVPB  Status:  Discontinued  500 mg 200 mL/hr over 30 Minutes Intravenous  Once 03/13/15 2102 03/13/15 2211         HPI/Subjective: " i feel fine". No AP,N,V  Objective: Filed Vitals:   03/13/15 1900 03/13/15 2218 03/14/15 0426 03/14/15 0516  BP: 136/48 138/41  155/53  Pulse: 71 69  66  Temp: 99 F (37.2 C) 98.2 F (36.8 C)  97.9 F (36.6 C)  TempSrc: Oral Oral  Oral  Resp: 18 18  18   Height: 5\' 5"  (1.651 m)      Weight: 70.398 kg (155 lb 3.2 oz)  68.811 kg (151 lb 11.2 oz)   SpO2:  98%  100%    Intake/Output Summary (Last 24 hours) at 03/14/15 1328 Last data filed at 03/14/15 0900  Gross per 24 hour  Intake 853.75 ml  Output      0 ml  Net 853.75 ml    Exam:  General: No acute respiratory distress Lungs: Clear to auscultation bilaterally without wheezes or crackles Cardiovascular: Regular rate and rhythm without murmur gallop or rub normal S1 and S2 Abdomen: Nontender, nondistended, soft, bowel sounds positive, no rebound, no ascites, no appreciable mass Extremities: No significant cyanosis, clubbing, or edema bilateral lower extremities     Data Review   Micro Results No results found for this or any previous visit (from the past 240 hour(s)).  Radiology Reports No results found.   CBC  Recent Labs Lab 03/13/15 2124  WBC 8.2  HGB 10.4*  HCT 31.1*  PLT 158  MCV 94.2  MCH 31.5  MCHC 33.4  RDW 14.8  LYMPHSABS 2.2  MONOABS 1.5*  EOSABS 0.0  BASOSABS 0.0    Chemistries   Recent Labs Lab 03/13/15 2124 03/14/15 0707  NA 139  --   K 3.7  --   CL 108  --   CO2 26  --   GLUCOSE 99  --   BUN 11  --   CREATININE 0.85  --   CALCIUM 8.9  --   AST 114* 88*  ALT 109* 91*  ALKPHOS 52 49  BILITOT 1.1 1.1   ------------------------------------------------------------------------------------------------------------------ estimated creatinine clearance is 48.8 mL/min (by C-G formula based on Cr of 0.85). ------------------------------------------------------------------------------------------------------------------ No results for input(s): HGBA1C in the last 72 hours. ------------------------------------------------------------------------------------------------------------------ No results for input(s): CHOL, HDL, LDLCALC, TRIG, CHOLHDL, LDLDIRECT in the last 72  hours. ------------------------------------------------------------------------------------------------------------------ No results for input(s): TSH, T4TOTAL, T3FREE, THYROIDAB in the last 72 hours.  Invalid input(s): FREET3 ------------------------------------------------------------------------------------------------------------------ No results for input(s): VITAMINB12, FOLATE, FERRITIN, TIBC, IRON, RETICCTPCT in the last 72 hours.  Coagulation profile No results for input(s): INR, PROTIME in the last 168 hours.  No results for input(s): DDIMER in the last 72 hours.  Cardiac Enzymes  Recent Labs Lab 03/13/15 2124  TROPONINI <0.03   ------------------------------------------------------------------------------------------------------------------ Invalid input(s): POCBNP   CBG:  Recent Labs Lab 03/14/15 0014 03/14/15 0425 03/14/15 0817 03/14/15 1140  GLUCAP 92 128* 99 78       Studies: No results found.    No results found for: HGBA1C Lab Results  Component Value Date   CREATININE 0.85 03/13/2015       Scheduled Meds: . hydrocortisone sod succinate (SOLU-CORTEF) inj  50 mg Intravenous Q12H  . imipenem-cilastatin  500 mg Intravenous Q8H  . levothyroxine  66 mcg Intravenous Daily   Continuous Infusions: . sodium chloride 1,000 mL (03/14/15 1210)    Principal Problem:   Choledocholithiasis Active Problems:   Hypothyroidism   Polymyalgia rheumatica (HCC)  Time spent: 45 minutes   [facility] Pager [phone]. If 7PM-7AM, please contact night-coverage at [email or web address], password [facility] 03/14/2015, 1:28 PM  LOS: 1 day

## 2015-03-14 NOTE — Consult Note (Signed)
[facility] Consult: 3:00 PM 03/14/2015  LOS: 1 day    Referring Provider: Dr Allyson Sabal  Primary Care Physician:  Gilford Rile, MD Primary Gastroenterologist:  Althia Forts.  Dr Odie Sera   Reason for Consultation:  Choledocholithiasis.    HPI: Beverly Estrada is a 81 y.o. female.  Generally healthy and active.   Hypothyroidism.  PMR, on Methotrexate and low dose prednisone.  For bilateral knee pain, she takes either 2 Aleve or 400 mg ibuprofen prn daily. She does not take NSAIDs on a daily basis Previous colonoscopy ~ 2013 by Dr Odie Sera in [place]:  Polyps.  Due to age no plans for re-screening. Previous remote EGD.    Transferred from [facility] overnight.  A few episodes of vague epigastric discomfort in past few weeks, she treated with antacids.  Appetite unphased and good.  Late Sunday PM onset of sudden, acute epigastric and RUQ abd pain. Eventually diffuse pain.  + nausea, fever, chills.  Intense pain lasted ~30 minutes but subsided by the time EMT arrived and transport to [place].  CT showed 1.1 cm CBD, suggestion of obstructing CBD stone, mild pericholecystic fluid but no overt cholcystitis, incidental sigmoid diverticulosis.   T bili 1.3.  Alk phos 78.  AST/ALT 174/71.  Lipase 76.    Pt sat all day at [facility] awaiting transfer to an ERCP capable facility. She felt well during the admission, able to toleerate solid food and sxs did not recur.  Arrived at [facility] last night.  LFTs improved this AM.  Hospitalist ordered HIDA scan which is in progress at this time.    Past Medical History  Diagnosis Date  . Glaucoma   . Arthritis   . Hypothyroidism   . Skin cancer of nose   . Polymyalgia rheumatica ([place])   . Multiple drug allergies     to multiple antibiotics.     Past Surgical History    Procedure Laterality Date  . Tonsil surgery    . Appendectomy    . Colonoscopy  ~2013    pooyps removed, one of them large per pt. Polyp pathology unclear. no rescreening study planned due to age    Prior to Admission medications   Medication Sig Start Date End Date Taking? Authorizing Provider  aspirin 81 MG tablet Take 81 mg by mouth daily.   Yes Historical Provider, MD  BAYER CONTOUR TEST test strip daily. for testing 07/19/14  Yes Historical Provider, MD  calcium-vitamin D 250-100 MG-UNIT tablet Take 1 tablet by mouth 2 (two) times daily.   Yes Historical Provider, MD  folic acid (FOLVITE) 1 MG tablet Take 1 mg by mouth daily.   Yes Historical Provider, MD  levothyroxine (SYNTHROID, LEVOTHROID) 112 MCG tablet Take 112 mcg by mouth daily before breakfast.  08/04/14  Yes Historical Provider, MD  methotrexate (RHEUMATREX) 2.5 MG tablet Take 7.5 mg by mouth once a week. Friday 07/20/14  Yes Historical Provider, MD  Multiple Vitamin (MULTIVITAMIN) tablet Take 1 tablet by mouth daily.   Yes Historical Provider, MD  Omega-3 Fatty Acids (FISH OIL) 1000 MG CAPS  Take 1,000 mg by mouth daily.   Yes Historical Provider, MD  predniSONE (DELTASONE) 1 MG tablet Take 2 mg by mouth daily with breakfast.  08/01/14  Yes Historical Provider, MD  vitamin E 100 UNIT capsule Take 100 Units by mouth daily.   Yes Historical Provider, MD    Scheduled Meds: . enoxaparin (LOVENOX) injection  40 mg Subcutaneous Q24H  . hydrocortisone sod succinate (SOLU-CORTEF) inj  50 mg Intravenous Q12H  . imipenem-cilastatin  500 mg Intravenous Q8H  . levothyroxine  66 mcg Intravenous Daily   Infusions: . sodium chloride 1,000 mL (03/14/15 1210)   PRN Meds: acetaminophen **OR** acetaminophen, morphine injection, ondansetron **OR** ondansetron (ZOFRAN) IV   Allergies as of 03/13/2015 - Review Complete 03/13/2015  Allergen Reaction Noted  . Biaxin [clarithromycin]  08/12/2014  . Clarithromycin Other (See Comments)  06/19/2011  . Amoxicillin-pot clavulanate Rash 06/19/2011  . Clindamycin/lincomycin Rash 06/19/2011    Family History  Problem Relation Age of Onset  . Diabetes Mellitus II Mother   . Stroke Mother   . Diabetes Mellitus II Brother   . Stroke Brother     Social History   Social History  . Marital Status: Widowed    Spouse Name: N/A  . Number of Children: N/A  . Years of Education: N/A   Occupational History  . Not on file.   Social History Main Topics  . Smoking status: Never Smoker   . Smokeless tobacco: Never Used  . Alcohol Use: No  . Drug Use: No  . Sexual Activity: Not on file   Other Topics Concern  . Not on file   Social History Narrative    REVIEW OF SYSTEMS: Constitutional:  Active, still drives long distances (to [place] on occasion).  Cleans her house,  ENT:  No nose bleeds Pulm:  No SOB or cough CV:  No palpitations, no LE edema.  GU:  No hematuria, no frequency.  Urine very yellow but not tea colored.  GI:  Per HPI Heme:  No unusual bleeding or bruising.  She is not aware of any prior history of anemia or of being prescribed iron, B12, folate in the past.  Transfusions:  none Neuro:  No headaches, no peripheral tingling or numbness Derm:  No itching, no rash or sores.  Endocrine:  No sweats or chills.  No polyuria or dysuria Immunization:  Not queried.  Travel:  None beyond local counties in last few months.    PHYSICAL EXAM: Vital signs in last 24 hours: Filed Vitals:   03/13/15 2218 03/14/15 0516  BP: 138/41 155/53  Pulse: 69 66  Temp: 98.2 F (36.8 C) 97.9 F (36.6 C)  Resp: 18 18   Wt Readings from Last 3 Encounters:  03/14/15 68.811 kg (151 lb 11.2 oz)  08/26/12 70.308 kg (155 lb)  06/19/11 77.111 kg (170 lb)   General: Pleasant, somewhat pale WF. She appears younger than her stated age Head:  No facial swelling or asymmetry. No signs of head trauma.  Eyes:  No scleral icterus, no conjunctival pallor. EOMI. Ears:  No  obvious hearing deficit  Nose:  No discharge or congestion Mouth:  Oral mucosa is pink, moist and clear. Dentition in good repair. Neck:  No JVD, no masses, no bruits. No thyromegaly Lungs:  Breathing quietly. No cough. Lungs clear to auscultation bilaterally. Heart: RRR. No MRG. S1/S2 audible. Abdomen:  Soft, not distended.  Mild to moderate tenderness in the epigastric and right upper quadrant. No guarding or rebound.  Active bowel sounds. Rectal: Deferred   Musc/Skeltl: As patient was laying on nuclear medicine gantry, did not perform a full joint survey. Extremities:  No pedal edema. Feet are warm.  Neurologic:  Patient is alert. Oriented 3. No tremor. Did not assess limb movement or strength. No gross deficits. Skin:  Not jaundiced. No obvious sores or rashes on incomplete derm survey Tattoos:  None Nodes:  No cervical adenopathy.   Psych:  Pleasant, calm, cooperative. Affect normal   LAB RESULTS:  Recent Labs  03/13/15 2124  WBC 8.2  HGB 10.4*  HCT 31.1*  PLT 158   BMET Lab Results  Component Value Date   NA 139 03/13/2015   K 3.7 03/13/2015   CL 108 03/13/2015   CO2 26 03/13/2015   GLUCOSE 99 03/13/2015   BUN 11 03/13/2015   CREATININE 0.85 03/13/2015   CALCIUM 8.9 03/13/2015   LFT  Recent Labs  03/13/15 2124 03/14/15 0707  PROT 6.0* 6.0*  ALBUMIN 3.3* 3.2*  AST 114* 88*  ALT 109* 91*  ALKPHOS 52 49  BILITOT 1.1 1.1  BILIDIR 0.2 0.2  IBILI 0.9 0.9       Component Value Date/Time   LIPASE 26 03/13/2015 2124     ENDOSCOPIC STUDIES: Per HPI  IMPRESSION:   *  Dilated CBD with possible distal choledocholithiasis.   Transaminases improved, pain resolved.  HIDA scan in process.  Suspect she may have passed the stone.   *  Normocytic anemia.  Colonoscopy by Dr Odie Sera in recent years: polyps.   *  PMR.  On MTX and low dose prednisone.    PLAN:     *  Per Dr. Carlean Purl. He plans to review images with radiologist.  If needed there is a  disc with imaging from [place] in pt chart.  ? Need of ERCP vs MRCP.      Azucena Freed  03/14/2015, 3:00 PM Pager: [place] Attending   I have taken an interval history, reviewed the chart and examined the patient. I agree with the Advanced Practitioner's note, impression and recommendations.    I looked at images of CT scan and there is an 11 mm CBD with some ? Of distal stone though could be edema in ampulla. i think MRCP is next step and have ordered. Her HIDA is negative so can dc Abx and I will.  Will f/u tomorrow. If MRCP shows a stone in duct(s) will need ERCP  Gatha Mayer, MD, [facility] [phone] (pager) [phone] after 5 PM, weekends and holidays  03/14/2015 3:39 PM

## 2015-03-14 NOTE — Care Management Note (Signed)
Case Management Note  Patient Details  Name: Beverly Estrada MRN: [id number] Date of Birth: 05-Mar-1930  Subjective/Objective:                  Date-03-14-15 Initial Assessment Spoke with patient at the bedside Introduced self as case manager and explained role in discharge planning and how to be reached.  Verified patient lives at home alone in [place]. Has daughter in [place] who is on her way. Verified patient anticipates to go home alone at time of discharge.  Patient has DME cane. Expressed potential need for no other DME.  Patient denied  needing help with their medication.  Patient drives  to MD appointments.  Verified patient has PCP Bea Graff. Patient states they currently receive [place] services through no one.    Plan: CM will continue to follow for discharge planning and [facility] resources.   Carles Collet RN BSN CM [phone]   Action/Plan:  Will continue to monitor for [facility] resources.  Expected Discharge Date:                  Expected Discharge Plan:  Home/Self Care  In-House Referral:     Discharge planning Services  CM Consult  Post Acute Care Choice:    Choice offered to:     DME Arranged:    DME Agency:     HH Arranged:    HH Agency:     Status of Service:  In process, will continue to follow  Medicare Important Message Given:    Date Medicare IM Given:    Medicare IM give by:    Date Additional Medicare IM Given:    Additional Medicare Important Message give by:     If discussed at [place] of Stay Meetings, dates discussed:    Additional Comments:  Carles Collet, RN 03/14/2015, 11:43 AM

## 2015-03-15 DIAGNOSIS — K8050 Calculus of bile duct without cholangitis or cholecystitis without obstruction: Principal | ICD-10-CM

## 2015-03-15 LAB — CBC
HCT: 31.6 % — ABNORMAL LOW (ref 36.0–46.0)
Hemoglobin: 10.2 g/dL — ABNORMAL LOW (ref 12.0–15.0)
MCH: 30.4 pg (ref 26.0–34.0)
MCHC: 32.3 g/dL (ref 30.0–36.0)
MCV: 94.3 fL (ref 78.0–100.0)
Platelets: 159 10*3/uL (ref 150–400)
RBC: 3.35 MIL/uL — ABNORMAL LOW (ref 3.87–5.11)
RDW: 14.3 % (ref 11.5–15.5)
WBC: 5.9 10*3/uL (ref 4.0–10.5)

## 2015-03-15 LAB — COMPREHENSIVE METABOLIC PANEL
ALT: 63 U/L — ABNORMAL HIGH (ref 14–54)
AST: 46 U/L — ABNORMAL HIGH (ref 15–41)
Albumin: 3.1 g/dL — ABNORMAL LOW (ref 3.5–5.0)
Alkaline Phosphatase: 44 U/L (ref 38–126)
Anion gap: 9 (ref 5–15)
BUN: 15 mg/dL (ref 6–20)
CO2: 23 mmol/L (ref 22–32)
Calcium: 8.6 mg/dL — ABNORMAL LOW (ref 8.9–10.3)
Chloride: 110 mmol/L (ref 101–111)
Creatinine, Ser: 0.67 mg/dL (ref 0.44–1.00)
GFR calc Af Amer: >60 mL/min (ref >60)
GFR calc non Af Amer: >60 mL/min (ref >60)
Glucose, Bld: 140 mg/dL — ABNORMAL HIGH (ref 65–99)
Potassium: 3.3 mmol/L — ABNORMAL LOW (ref 3.5–5.1)
Sodium: 142 mmol/L (ref 135–145)
Total Bilirubin: 0.8 mg/dL (ref 0.3–1.2)
Total Protein: 6 g/dL — ABNORMAL LOW (ref 6.5–8.1)

## 2015-03-15 LAB — GLUCOSE, CAPILLARY
Glucose-Capillary: 106 mg/dL — ABNORMAL HIGH (ref 65–99)
Glucose-Capillary: 115 mg/dL — ABNORMAL HIGH (ref 65–99)
Glucose-Capillary: 119 mg/dL — ABNORMAL HIGH (ref 65–99)
Glucose-Capillary: 94 mg/dL (ref 65–99)

## 2015-03-15 MED ORDER — LEVOTHYROXINE SODIUM 112 MCG PO TABS: 112.0000 ug | ORAL_TABLET | Freq: Every day | ORAL | Status: DC

## 2015-03-15 NOTE — Progress Notes (Addendum)
   Patient Name: Beverly Estrada Date of Encounter: 03/15/2015, 10:04 AM    Subjective  Feels well, no pain. Hungry   Objective  BP 145/53 mmHg  Pulse 67  Temp(Src) 98.1 F (36.7 C) (Oral)  Resp 18  Ht 5\' 5"  (1.651 m)  Wt 151 lb 0.2 oz (68.5 kg)  BMI 25.13 kg/m2  SpO2 100%  Abd NT   MRCP no gallstones or CBD stones and CBD less dilated  Lab Results  Component Value Date   ALT 63* 03/15/2015   AST 46* 03/15/2015   ALKPHOS 44 03/15/2015   BILITOT 0.8 03/15/2015        Assessment and Plan   Presumed passed CBD stone Doing well Feed and dc today is my recommendation No gallstones so no need for cholecystectomy   PCP can f/u LFT's next month  Thanks    Gatha Mayer, MD, [facility] [phone] (pager) [phone] after 5 PM, weekends and holidays  03/15/2015 10:04 AM

## 2015-03-15 NOTE — Progress Notes (Signed)
Nsg Discharge Note  Admit Date:  03/13/2015 Discharge date: 03/15/2015   Beverly Estrada to be D/C'd Home per MD order.  AVS completed.  Copy for chart, and copy for patient signed, and dated. Patient/caregiver able to verbalize understanding.  Discharge Medication:   Medication List    TAKE these medications        aspirin 81 MG tablet  Take 81 mg by mouth daily.     BAYER CONTOUR TEST test strip  Generic drug:  glucose blood  daily. for testing     calcium-vitamin D 250-100 MG-UNIT tablet  Take 1 tablet by mouth 2 (two) times daily.     Fish Oil 1000 MG Caps  Take 1,000 mg by mouth daily.     folic acid 1 MG tablet  Commonly known as:  FOLVITE  Take 1 mg by mouth daily.     levothyroxine 112 MCG tablet  Commonly known as:  SYNTHROID, LEVOTHROID  Take 112 mcg by mouth daily before breakfast.     methotrexate 2.5 MG tablet  Commonly known as:  RHEUMATREX  Take 7.5 mg by mouth once a week. Friday     multivitamin tablet  Take 1 tablet by mouth daily.     predniSONE 1 MG tablet  Commonly known as:  DELTASONE  Take 2 mg by mouth daily with breakfast.     vitamin E 100 UNIT capsule  Take 100 Units by mouth daily.        Discharge Assessment: Filed Vitals:   03/15/15 0618 03/15/15 1421  BP: 145/53 189/58  Pulse: 67 73  Temp: 98.1 F (36.7 C) 97.7 F (36.5 C)  Resp: 18 20   Skin clean, dry and intact without evidence of skin break down, no evidence of skin tears noted. IV catheter discontinued intact. Site without signs and symptoms of complications - no redness or edema noted at insertion site, patient denies c/o pain - only slight tenderness at site.  Dressing with slight pressure applied.  D/c Instructions-Education: Discharge instructions given to patient/family with verbalized understanding. D/c education completed with patient/family including follow up instructions, medication list, d/c activities limitations if indicated, with other d/c  instructions as indicated by MD - patient able to verbalize understanding, all questions fully answered. Patient instructed to return to ED, call 911, or call MD for any changes in condition.  Patient escorted via [place], and D/C home via private auto.  Dayle Points, RN 03/15/2015 3:24 PM

## 2015-03-15 NOTE — Discharge Summary (Signed)
Physician Discharge Summary  Beverly Estrada MRN: [id number] DOB/AGE: 04/10/1929 81 y.o.  PCP: Gilford Rile, MD   Admit date: 03/13/2015 Discharge date: 03/15/2015  Discharge Diagnoses:     Principal Problem:   Choledocholithiasis Active Problems:   Hypothyroidism   Polymyalgia rheumatica ([place])    Follow-up recommendations Follow-up with PCP in 3-5 days , including all  additional recommended appointments as below Follow-up CBC,cmp in 7-10 days       Medication List    TAKE these medications        aspirin 81 MG tablet  Take 81 mg by mouth daily.     BAYER CONTOUR TEST test strip  Generic drug:  glucose blood  daily. for testing     calcium-vitamin D 250-100 MG-UNIT tablet  Take 1 tablet by mouth 2 (two) times daily.     Fish Oil 1000 MG Caps  Take 1,000 mg by mouth daily.     folic acid 1 MG tablet  Commonly known as:  FOLVITE  Take 1 mg by mouth daily.     levothyroxine 112 MCG tablet  Commonly known as:  SYNTHROID, LEVOTHROID  Take 112 mcg by mouth daily before breakfast.     methotrexate 2.5 MG tablet  Commonly known as:  RHEUMATREX  Take 7.5 mg by mouth once a week. Friday     multivitamin tablet  Take 1 tablet by mouth daily.     predniSONE 1 MG tablet  Commonly known as:  DELTASONE  Take 2 mg by mouth daily with breakfast.     vitamin E 100 UNIT capsule  Take 100 Units by mouth daily.         Discharge Condition: *  Discharge Instructions       Discharge Instructions    Diet - low sodium heart healthy    Complete by:  As directed      Increase activity slowly    Complete by:  As directed            Allergies  Allergen Reactions  . Biaxin [Clarithromycin]   . Clarithromycin Other (See Comments)    Tasted so bad could sleep from swallowing  . Amoxicillin-Pot Clavulanate Rash  . Clindamycin/Lincomycin Rash      Disposition: Final discharge disposition not confirmed   Consults:  Gatha Mayer, MD, [facility]  Gastroenterology    Significant Diagnostic Studies:  Nm Hepatobiliary Liver Func  03/14/2015  CLINICAL DATA:  Pericholecystic fluid, possible cholecystitis EXAM: NUCLEAR MEDICINE HEPATOBILIARY IMAGING TECHNIQUE: Sequential images of the abdomen were obtained out to 60 minutes following intravenous administration of radiopharmaceutical. RADIOPHARMACEUTICALS:  5 mCi Tc-69m Choletec IV COMPARISON:  03/12/2015 FINDINGS: There is adequate uptake of radioactive tracer throughout the liver immediately following injection. Visualization the biliary tree is noted at 15 minutes with visualization of the gallbladder at 25 minutes. Progressive filling of the gallbladder is seen. Small bowel activity is noted at 35 minutes. IMPRESSION: Normal uptake and excretion of biliary tracer. No evidence of cystic or common bile duct obstruction Electronically Signed   By: MInez CatalinaM.D.   On: 03/14/2015 14:59   Mr 3d Recon At Scanner  03/14/2015  CLINICAL DATA:  Abdominal/right upper quadrant pain. Nausea with fever and chills. CT demonstrating probable distal bile duct obstructing stone. EXAM: MRI ABDOMEN WITHOUT AND WITH CONTRAST (INCLUDING MRCP) TECHNIQUE: Multiplanar multisequence MR imaging of the abdomen was performed both before and after the administration of intravenous contrast. Heavily T2-weighted images of the biliary and pancreatic  ducts were obtained, and three-dimensional MRCP images were rendered by post processing. CONTRAST:  20m MULTIHANCE GADOBENATE DIMEGLUMINE 529 MG/ML IV SOLN COMPARISON:  CT of 03/12/2015 and nuclear medicine hepatobiliary study of earlier today. FINDINGS: Lower chest: Mild cardiomegaly, without pericardial or pleural effusion. Hepatobiliary: Scattered hepatic cysts. No intrahepatic ductal dilatation. Normal appearance of the gallbladder. Common duct dilatation is decreased. For example, measures maximally 10 mm just above the pancreatic head on coronal image 20/series 3. Compare 11  mm at the same level on the prior CT. Tapers to 5 mm in the region of the pancreatic head on image 20/series 3. Compare 9 mm at the same level on the prior. No evidence of obstructive choledocholithiasis. No definite correlate for the CT abnormality. There is a periampullary duodenal diverticulum which may have distorted anatomy in this region. Pancreas: No dominant pancreatic mass or pancreatic duct dilatation. There is tiny focus of side branch duct ectasia within the body on image 73/series 6, of doubtful clinical significance. No acute pancreatitis. Spleen: Normal in size, without focal abnormality. Adrenals/Urinary Tract: Normal adrenal glands. Normal kidneys, without hydronephrosis. Stomach/Bowel: Proximal gastric underdistention. Otherwise normal stomach and abdominal bowel loops. Vascular/Lymphatic: Advanced aortic atherosclerosis. Prominent gonadal veins, especially on the left. No retroperitoneal or retrocrural adenopathy. Other: No ascites. Musculoskeletal: Mild S-shaped lumbar spine curvature. IMPRESSION: 1. Decrease in mild common duct dilatation for age. No evidence of obstructive stone or mass. The questioned stone in the region of the ampulla on CT may have been artifactual, secondary to ampullary enhancement, or could have represented a stone which has passed in the interval. 2. No evidence of cholecystitis or other acute process in the abdomen. 3. Prominent gonadal veins, as can be seen with pelvic congestion syndrome. Electronically Signed   By: KAbigail MiyamotoM.D.   On: 03/14/2015 20:49   Mr Abd W/wo Cm/mrcp  03/14/2015  CLINICAL DATA:  Abdominal/right upper quadrant pain. Nausea with fever and chills. CT demonstrating probable distal bile duct obstructing stone. EXAM: MRI ABDOMEN WITHOUT AND WITH CONTRAST (INCLUDING MRCP) TECHNIQUE: Multiplanar multisequence MR imaging of the abdomen was performed both before and after the administration of intravenous contrast. Heavily T2-weighted images of  the biliary and pancreatic ducts were obtained, and three-dimensional MRCP images were rendered by post processing. CONTRAST:  154mMULTIHANCE GADOBENATE DIMEGLUMINE 529 MG/ML IV SOLN COMPARISON:  CT of 03/12/2015 and nuclear medicine hepatobiliary study of earlier today. FINDINGS: Lower chest: Mild cardiomegaly, without pericardial or pleural effusion. Hepatobiliary: Scattered hepatic cysts. No intrahepatic ductal dilatation. Normal appearance of the gallbladder. Common duct dilatation is decreased. For example, measures maximally 10 mm just above the pancreatic head on coronal image 20/series 3. Compare 11 mm at the same level on the prior CT. Tapers to 5 mm in the region of the pancreatic head on image 20/series 3. Compare 9 mm at the same level on the prior. No evidence of obstructive choledocholithiasis. No definite correlate for the CT abnormality. There is a periampullary duodenal diverticulum which may have distorted anatomy in this region. Pancreas: No dominant pancreatic mass or pancreatic duct dilatation. There is tiny focus of side branch duct ectasia within the body on image 73/series 6, of doubtful clinical significance. No acute pancreatitis. Spleen: Normal in size, without focal abnormality. Adrenals/Urinary Tract: Normal adrenal glands. Normal kidneys, without hydronephrosis. Stomach/Bowel: Proximal gastric underdistention. Otherwise normal stomach and abdominal bowel loops. Vascular/Lymphatic: Advanced aortic atherosclerosis. Prominent gonadal veins, especially on the left. No retroperitoneal or retrocrural adenopathy. Other: No ascites. Musculoskeletal: Mild S-shaped  lumbar spine curvature. IMPRESSION: 1. Decrease in mild common duct dilatation for age. No evidence of obstructive stone or mass. The questioned stone in the region of the ampulla on CT may have been artifactual, secondary to ampullary enhancement, or could have represented a stone which has passed in the interval. 2. No evidence of  cholecystitis or other acute process in the abdomen. 3. Prominent gonadal veins, as can be seen with pelvic congestion syndrome. Electronically Signed   By: Abigail Miyamoto M.D.   On: 03/14/2015 20:49        Filed Weights   03/13/15 1900 03/14/15 0426 03/15/15 0618  Weight: 70.398 kg (155 lb 3.2 oz) 68.811 kg (151 lb 11.2 oz) 68.5 kg (151 lb 0.2 oz)     Microbiology: No results found for this or any previous visit (from the past 240 hour(s)).     Blood Culture No results found for: SDES, [place], CULT, REPTSTATUS    Labs: Results for orders placed or performed during the hospital encounter of 03/13/15 (from the past 48 hour(s))  Hepatic function panel     Status: Abnormal   Collection Time: 03/13/15  9:24 PM  Result Value Ref Range   Total Protein 6.0 (L) 6.5 - 8.1 g/dL   Albumin 3.3 (L) 3.5 - 5.0 g/dL   AST 114 (H) 15 - 41 U/L   ALT 109 (H) 14 - 54 U/L   Alkaline Phosphatase 52 38 - 126 U/L   Total Bilirubin 1.1 0.3 - 1.2 mg/dL   Bilirubin, Direct 0.2 0.1 - 0.5 mg/dL   Indirect Bilirubin 0.9 0.3 - 0.9 mg/dL  Lipase, blood     Status: None   Collection Time: 03/13/15  9:24 PM  Result Value Ref Range   Lipase 26 11 - 51 U/L  Troponin I     Status: None   Collection Time: 03/13/15  9:24 PM  Result Value Ref Range   Troponin I <0.03 <0.031 ng/mL    Comment:        NO INDICATION OF MYOCARDIAL INJURY.   Basic metabolic panel     Status: None   Collection Time: 03/13/15  9:24 PM  Result Value Ref Range   Sodium 139 135 - 145 mmol/L   Potassium 3.7 3.5 - 5.1 mmol/L   Chloride 108 101 - 111 mmol/L   CO2 26 22 - 32 mmol/L   Glucose, Bld 99 65 - 99 mg/dL   BUN 11 6 - 20 mg/dL   Creatinine, Ser 0.85 0.44 - 1.00 mg/dL   Calcium 8.9 8.9 - 10.3 mg/dL   GFR calc non Af Amer >60 >60 mL/min   GFR calc Af Amer >60 >60 mL/min    Comment: (NOTE) The eGFR has been calculated using the CKD EPI equation. This calculation has not been validated in all clinical  situations. eGFR's persistently <60 mL/min signify possible Chronic Kidney Disease.    Anion gap 5 5 - 15  CBC with Differential/Platelet     Status: Abnormal   Collection Time: 03/13/15  9:24 PM  Result Value Ref Range   WBC 8.2 4.0 - 10.5 K/uL   RBC 3.30 (L) 3.87 - 5.11 MIL/uL   Hemoglobin 10.4 (L) 12.0 - 15.0 g/dL   HCT 31.1 (L) 36.0 - 46.0 %   MCV 94.2 78.0 - 100.0 fL   MCH 31.5 26.0 - 34.0 pg   MCHC 33.4 30.0 - 36.0 g/dL   RDW 14.8 11.5 - 15.5 %   Platelets 158 150 -  400 K/uL   Neutrophils Relative % 54 %   Neutro Abs 4.4 1.7 - 7.7 K/uL   Lymphocytes Relative 27 %   Lymphs Abs 2.2 0.7 - 4.0 K/uL   Monocytes Relative 19 %   Monocytes Absolute 1.5 (H) 0.1 - 1.0 K/uL   Eosinophils Relative 0 %   Eosinophils Absolute 0.0 0.0 - 0.7 K/uL   Basophils Relative 0 %   Basophils Absolute 0.0 0.0 - 0.1 K/uL  Glucose, capillary     Status: None   Collection Time: 03/14/15 12:14 AM  Result Value Ref Range   Glucose-Capillary 92 65 - 99 mg/dL   Comment 1 Notify RN    Comment 2 Document in Chart   Glucose, capillary     Status: Abnormal   Collection Time: 03/14/15  4:25 AM  Result Value Ref Range   Glucose-Capillary 128 (H) 65 - 99 mg/dL   Comment 1 Notify RN    Comment 2 Document in Chart   Hepatic function panel     Status: Abnormal   Collection Time: 03/14/15  7:07 AM  Result Value Ref Range   Total Protein 6.0 (L) 6.5 - 8.1 g/dL   Albumin 3.2 (L) 3.5 - 5.0 g/dL   AST 88 (H) 15 - 41 U/L   ALT 91 (H) 14 - 54 U/L   Alkaline Phosphatase 49 38 - 126 U/L   Total Bilirubin 1.1 0.3 - 1.2 mg/dL   Bilirubin, Direct 0.2 0.1 - 0.5 mg/dL   Indirect Bilirubin 0.9 0.3 - 0.9 mg/dL  Glucose, capillary     Status: None   Collection Time: 03/14/15  8:17 AM  Result Value Ref Range   Glucose-Capillary 99 65 - 99 mg/dL  Glucose, capillary     Status: None   Collection Time: 03/14/15 11:40 AM  Result Value Ref Range   Glucose-Capillary 78 65 - 99 mg/dL  Glucose, capillary     Status:  Abnormal   Collection Time: 03/14/15  5:11 PM  Result Value Ref Range   Glucose-Capillary 107 (H) 65 - 99 mg/dL  Glucose, capillary     Status: None   Collection Time: 03/14/15  9:21 PM  Result Value Ref Range   Glucose-Capillary 76 65 - 99 mg/dL   Comment 1 Notify RN    Comment 2 Document in Chart   Glucose, capillary     Status: None   Collection Time: 03/15/15 12:03 AM  Result Value Ref Range   Glucose-Capillary 94 65 - 99 mg/dL   Comment 1 Notify RN    Comment 2 Document in Chart   Glucose, capillary     Status: Abnormal   Collection Time: 03/15/15  3:56 AM  Result Value Ref Range   Glucose-Capillary 119 (H) 65 - 99 mg/dL   Comment 1 Notify RN    Comment 2 Document in Chart   CBC     Status: Abnormal   Collection Time: 03/15/15  5:47 AM  Result Value Ref Range   WBC 5.9 4.0 - 10.5 K/uL   RBC 3.35 (L) 3.87 - 5.11 MIL/uL   Hemoglobin 10.2 (L) 12.0 - 15.0 g/dL   HCT 31.6 (L) 36.0 - 46.0 %   MCV 94.3 78.0 - 100.0 fL   MCH 30.4 26.0 - 34.0 pg   MCHC 32.3 30.0 - 36.0 g/dL   RDW 14.3 11.5 - 15.5 %   Platelets 159 150 - 400 K/uL  Comprehensive metabolic panel     Status: Abnormal   Collection  Time: 03/15/15  5:47 AM  Result Value Ref Range   Sodium 142 135 - 145 mmol/L   Potassium 3.3 (L) 3.5 - 5.1 mmol/L   Chloride 110 101 - 111 mmol/L   CO2 23 22 - 32 mmol/L   Glucose, Bld 140 (H) 65 - 99 mg/dL   BUN 15 6 - 20 mg/dL   Creatinine, Ser 0.67 0.44 - 1.00 mg/dL   Calcium 8.6 (L) 8.9 - 10.3 mg/dL   Total Protein 6.0 (L) 6.5 - 8.1 g/dL   Albumin 3.1 (L) 3.5 - 5.0 g/dL   AST 46 (H) 15 - 41 U/L   ALT 63 (H) 14 - 54 U/L   Alkaline Phosphatase 44 38 - 126 U/L   Total Bilirubin 0.8 0.3 - 1.2 mg/dL   GFR calc non Af Amer >60 >60 mL/min   GFR calc Af Amer >60 >60 mL/min    Comment: (NOTE) The eGFR has been calculated using the CKD EPI equation. This calculation has not been validated in all clinical situations. eGFR's persistently <60 mL/min signify possible Chronic  Kidney Disease.    Anion gap 9 5 - 15  Glucose, capillary     Status: Abnormal   Collection Time: 03/15/15  8:05 AM  Result Value Ref Range   Glucose-Capillary 115 (H) 65 - 99 mg/dL  Glucose, capillary     Status: Abnormal   Collection Time: 03/15/15 11:51 AM  Result Value Ref Range   Glucose-Capillary 106 (H) 65 - 99 mg/dL     Lipid Panel  No results found for: CHOL, TRIG, HDL, CHOLHDL, VLDL, LDLCALC, LDLDIRECT   No results found for: HGBA1C   Lab Results  Component Value Date   CREATININE 0.67 03/15/2015     81 y.o. female. Generally healthy and active. Hypothyroidism. PMR, on Methotrexate and low dose prednisone. For bilateral knee pain, she takes either 2 Aleve or 400 mg ibuprofen prn daily. She does not take NSAIDs on a daily basis.Previous colonoscopy ~ 2013 by Dr Odie Sera in [place]: Polyps. Due to age no plans for re-screening. Previous remote EGD. Transferred from [place] overnight. A few episodes of vague epigastric discomfort in past few weeks, she treated with antacids. Appetite unphased and good.  Late Sunday PM onset of sudden, acute epigastric and RUQ abd pain. Eventually diffuse pain. + nausea, fever, chills. Intense pain lasted ~30 minutes but subsided by the time EMT arrived and transport to [place]. CT showed 1.1 cm CBD, suggestion of obstructing CBD stone, mild pericholecystic fluid but no overt cholcystitis, incidental sigmoid diverticulosis.  T bili 1.3. Alk phos 78. AST/ALT 174/71. Lipase 76.   Pt sat all day at [facility] awaiting transfer to an ERCP capable facility. She felt well during the admission, able to toleerate solid food and sxs did not recur. Arrived at [facility] last night. LFTs improved this AM.  ordered HIDA scan which was wnl    DisAssessment and plan  1. probale Choledocholithiasis - CT abdomen and pelvis shows dilated common bile duct at 1.1 cm with 6 mm stone distally at the duodenal ampulla with mild apparent  pericholecystic fluid. No definite evidence of acute cholecystitis.   patient had significant leukocytosis  at [facility] showing WBC of 24,000 and patient   had fever and chills ,started empirically on Primaxin. Dr. Carlean Purl was consulted prior to transfer,started on  IV fluids and pain relieving medications. Further recommendations per gastroenterologist. Gi ordered HIDA scan and MRCP results as above, may have passed a stone, diet advance ,  ok to dc from GI standpoint without intervention, repeat LFT's outpt  2. Polymyalgia rheumatica - since patient has been on long-term steroids she was  placed patient on IV hydrocortisone for now. Resume prednisone at DC  3. Hypothyroidism -  Cont Synthroid  . 4. History of diabetes mellitus on diet -  Stable CBGs. 5. Normocytic normochromic anemia -stable  CBC.    Blood pressure 145/53, pulse 67, temperature 98.1 F (36.7 C), temperature source Oral, resp. rate 18, height '5\' 5"'$  (1.651 m), weight 68.5 kg (151 lb 0.2 oz), SpO2 100 %.   General: Pleasant, somewhat pale WF. She appears younger than her stated age Head: No facial swelling or asymmetry. No signs of head trauma.  Eyes: No scleral icterus, no conjunctival pallor. EOMI. Ears: No obvious hearing deficit  Nose: No discharge or congestion Mouth: Oral mucosa is pink, moist and clear. Dentition in good repair. Neck: No JVD, no masses, no bruits. No thyromegaly Lungs: Breathing quietly. No cough. Lungs clear to auscultation bilaterally. Heart: RRR. No MRG. S1/S2 audible. Abdomen: Soft, not distended. Mild to moderate tenderness in the epigastric and right upper quadrant. No guarding or rebound. Active bowel sounds. Rectal: Deferred  Musc/Skeltl: As patient was laying on nuclear medicine gantry, did not perform a full joint survey. Extremities: No pedal edema. Feet are warm.  Neurologic: Patient is alert. Oriented 3. No tremor. Did not assess limb movement or strength. No  gross deficits.   Follow-up Information    Follow up with Gilford Rile, MD. Schedule an appointment as soon as possible for a visit in 3 days.   Specialty:  Internal Medicine   Contact information:   [place] [address] [phone]       Signed: Reyne Dumas 03/15/2015, 1:10 PM        Time spent >45 mins

## 2015-03-20 DIAGNOSIS — N183 Chronic kidney disease, stage 3 (moderate): Secondary | ICD-10-CM | POA: Diagnosis not present

## 2015-03-20 DIAGNOSIS — Z79899 Other long term (current) drug therapy: Secondary | ICD-10-CM | POA: Diagnosis not present

## 2015-03-20 DIAGNOSIS — E119 Type 2 diabetes mellitus without complications: Secondary | ICD-10-CM | POA: Diagnosis not present

## 2015-03-20 DIAGNOSIS — K8020 Calculus of gallbladder without cholecystitis without obstruction: Secondary | ICD-10-CM | POA: Diagnosis not present

## 2015-03-20 DIAGNOSIS — M353 Polymyalgia rheumatica: Secondary | ICD-10-CM | POA: Diagnosis not present

## 2015-04-04 DIAGNOSIS — Z79899 Other long term (current) drug therapy: Secondary | ICD-10-CM | POA: Diagnosis not present

## 2015-04-05 DIAGNOSIS — M25561 Pain in right knee: Secondary | ICD-10-CM | POA: Diagnosis not present

## 2015-04-05 DIAGNOSIS — M79671 Pain in right foot: Secondary | ICD-10-CM | POA: Diagnosis not present

## 2015-04-05 DIAGNOSIS — M0579 Rheumatoid arthritis with rheumatoid factor of multiple sites without organ or systems involvement: Secondary | ICD-10-CM | POA: Diagnosis not present

## 2015-04-05 DIAGNOSIS — Z79899 Other long term (current) drug therapy: Secondary | ICD-10-CM | POA: Diagnosis not present

## 2015-04-05 DIAGNOSIS — M79641 Pain in right hand: Secondary | ICD-10-CM | POA: Diagnosis not present

## 2015-04-05 DIAGNOSIS — M79672 Pain in left foot: Secondary | ICD-10-CM | POA: Diagnosis not present

## 2015-04-05 DIAGNOSIS — M25562 Pain in left knee: Secondary | ICD-10-CM | POA: Diagnosis not present

## 2015-04-05 DIAGNOSIS — M353 Polymyalgia rheumatica: Secondary | ICD-10-CM | POA: Diagnosis not present

## 2015-04-05 DIAGNOSIS — M545 Low back pain: Secondary | ICD-10-CM | POA: Diagnosis not present

## 2015-04-05 DIAGNOSIS — M79642 Pain in left hand: Secondary | ICD-10-CM | POA: Diagnosis not present

## 2015-05-30 DIAGNOSIS — K8020 Calculus of gallbladder without cholecystitis without obstruction: Secondary | ICD-10-CM | POA: Diagnosis not present

## 2015-05-30 DIAGNOSIS — R1011 Right upper quadrant pain: Secondary | ICD-10-CM | POA: Diagnosis not present

## 2015-05-30 DIAGNOSIS — Z79899 Other long term (current) drug therapy: Secondary | ICD-10-CM | POA: Diagnosis not present

## 2015-08-16 DIAGNOSIS — H26493 Other secondary cataract, bilateral: Secondary | ICD-10-CM | POA: Diagnosis not present

## 2015-08-16 DIAGNOSIS — H04123 Dry eye syndrome of bilateral lacrimal glands: Secondary | ICD-10-CM | POA: Diagnosis not present

## 2015-08-16 DIAGNOSIS — H35372 Puckering of macula, left eye: Secondary | ICD-10-CM | POA: Diagnosis not present

## 2015-08-29 DIAGNOSIS — Z79899 Other long term (current) drug therapy: Secondary | ICD-10-CM | POA: Diagnosis not present

## 2015-09-02 DIAGNOSIS — R112 Nausea with vomiting, unspecified: Secondary | ICD-10-CM | POA: Diagnosis not present

## 2015-09-02 DIAGNOSIS — R1011 Right upper quadrant pain: Secondary | ICD-10-CM | POA: Diagnosis not present

## 2015-09-02 DIAGNOSIS — R740 Nonspecific elevation of levels of transaminase and lactic acid dehydrogenase [LDH]: Secondary | ICD-10-CM | POA: Diagnosis not present

## 2015-09-03 DIAGNOSIS — K810 Acute cholecystitis: Secondary | ICD-10-CM | POA: Diagnosis not present

## 2015-09-03 DIAGNOSIS — E039 Hypothyroidism, unspecified: Secondary | ICD-10-CM | POA: Diagnosis not present

## 2015-09-03 DIAGNOSIS — Z9849 Cataract extraction status, unspecified eye: Secondary | ICD-10-CM | POA: Diagnosis not present

## 2015-09-03 DIAGNOSIS — G8929 Other chronic pain: Secondary | ICD-10-CM | POA: Diagnosis present

## 2015-09-03 DIAGNOSIS — K7589 Other specified inflammatory liver diseases: Secondary | ICD-10-CM | POA: Diagnosis not present

## 2015-09-03 DIAGNOSIS — R109 Unspecified abdominal pain: Secondary | ICD-10-CM | POA: Diagnosis not present

## 2015-09-03 DIAGNOSIS — K812 Acute cholecystitis with chronic cholecystitis: Secondary | ICD-10-CM | POA: Diagnosis not present

## 2015-09-03 DIAGNOSIS — K861 Other chronic pancreatitis: Secondary | ICD-10-CM | POA: Diagnosis not present

## 2015-09-03 DIAGNOSIS — Z9089 Acquired absence of other organs: Secondary | ICD-10-CM | POA: Diagnosis not present

## 2015-09-03 DIAGNOSIS — J918 Pleural effusion in other conditions classified elsewhere: Secondary | ICD-10-CM | POA: Diagnosis not present

## 2015-09-03 DIAGNOSIS — Z880 Allergy status to penicillin: Secondary | ICD-10-CM | POA: Diagnosis not present

## 2015-09-03 DIAGNOSIS — M069 Rheumatoid arthritis, unspecified: Secondary | ICD-10-CM | POA: Diagnosis not present

## 2015-09-03 DIAGNOSIS — E119 Type 2 diabetes mellitus without complications: Secondary | ICD-10-CM | POA: Diagnosis not present

## 2015-09-03 DIAGNOSIS — R17 Unspecified jaundice: Secondary | ICD-10-CM | POA: Diagnosis not present

## 2015-09-03 DIAGNOSIS — R945 Abnormal results of liver function studies: Secondary | ICD-10-CM | POA: Diagnosis present

## 2015-09-03 DIAGNOSIS — Z833 Family history of diabetes mellitus: Secondary | ICD-10-CM | POA: Diagnosis not present

## 2015-09-03 DIAGNOSIS — K8020 Calculus of gallbladder without cholecystitis without obstruction: Secondary | ICD-10-CM | POA: Diagnosis not present

## 2015-09-03 DIAGNOSIS — K5900 Constipation, unspecified: Secondary | ICD-10-CM | POA: Diagnosis present

## 2015-09-03 DIAGNOSIS — H9193 Unspecified hearing loss, bilateral: Secondary | ICD-10-CM | POA: Diagnosis present

## 2015-09-03 DIAGNOSIS — D1779 Benign lipomatous neoplasm of other sites: Secondary | ICD-10-CM | POA: Diagnosis not present

## 2015-09-03 DIAGNOSIS — D175 Benign lipomatous neoplasm of intra-abdominal organs: Secondary | ICD-10-CM | POA: Diagnosis not present

## 2015-09-03 DIAGNOSIS — Z881 Allergy status to other antibiotic agents status: Secondary | ICD-10-CM | POA: Diagnosis not present

## 2015-09-03 DIAGNOSIS — Z87891 Personal history of nicotine dependence: Secondary | ICD-10-CM | POA: Diagnosis not present

## 2015-09-03 DIAGNOSIS — R7989 Other specified abnormal findings of blood chemistry: Secondary | ICD-10-CM | POA: Diagnosis not present

## 2015-09-03 DIAGNOSIS — Z85820 Personal history of malignant melanoma of skin: Secondary | ICD-10-CM | POA: Diagnosis not present

## 2015-09-03 DIAGNOSIS — R112 Nausea with vomiting, unspecified: Secondary | ICD-10-CM | POA: Diagnosis not present

## 2015-09-03 DIAGNOSIS — R1084 Generalized abdominal pain: Secondary | ICD-10-CM | POA: Diagnosis not present

## 2015-09-03 DIAGNOSIS — E876 Hypokalemia: Secondary | ICD-10-CM | POA: Diagnosis not present

## 2015-09-03 DIAGNOSIS — R110 Nausea: Secondary | ICD-10-CM | POA: Diagnosis not present

## 2015-09-03 DIAGNOSIS — K8689 Other specified diseases of pancreas: Secondary | ICD-10-CM | POA: Diagnosis not present

## 2015-09-03 DIAGNOSIS — Z9889 Other specified postprocedural states: Secondary | ICD-10-CM | POA: Diagnosis not present

## 2015-09-03 DIAGNOSIS — R740 Nonspecific elevation of levels of transaminase and lactic acid dehydrogenase [LDH]: Secondary | ICD-10-CM | POA: Diagnosis not present

## 2015-09-03 DIAGNOSIS — K5730 Diverticulosis of large intestine without perforation or abscess without bleeding: Secondary | ICD-10-CM | POA: Diagnosis not present

## 2015-09-03 DIAGNOSIS — M353 Polymyalgia rheumatica: Secondary | ICD-10-CM | POA: Diagnosis not present

## 2015-09-03 DIAGNOSIS — R1011 Right upper quadrant pain: Secondary | ICD-10-CM | POA: Diagnosis not present

## 2015-09-14 DIAGNOSIS — R1011 Right upper quadrant pain: Secondary | ICD-10-CM | POA: Diagnosis not present

## 2015-09-14 DIAGNOSIS — Z4802 Encounter for removal of sutures: Secondary | ICD-10-CM | POA: Diagnosis not present

## 2015-09-14 DIAGNOSIS — K811 Chronic cholecystitis: Secondary | ICD-10-CM | POA: Diagnosis not present

## 2015-09-18 DIAGNOSIS — R5381 Other malaise: Secondary | ICD-10-CM | POA: Diagnosis not present

## 2015-09-18 DIAGNOSIS — F330 Major depressive disorder, recurrent, mild: Secondary | ICD-10-CM | POA: Diagnosis not present

## 2015-09-18 DIAGNOSIS — E1122 Type 2 diabetes mellitus with diabetic chronic kidney disease: Secondary | ICD-10-CM | POA: Diagnosis not present

## 2015-09-18 DIAGNOSIS — F5104 Psychophysiologic insomnia: Secondary | ICD-10-CM | POA: Diagnosis not present

## 2015-09-18 DIAGNOSIS — E039 Hypothyroidism, unspecified: Secondary | ICD-10-CM | POA: Diagnosis not present

## 2015-09-18 DIAGNOSIS — N183 Chronic kidney disease, stage 3 (moderate): Secondary | ICD-10-CM | POA: Diagnosis not present

## 2015-09-18 DIAGNOSIS — M0600 Rheumatoid arthritis without rheumatoid factor, unspecified site: Secondary | ICD-10-CM | POA: Diagnosis not present

## 2015-09-18 DIAGNOSIS — N189 Chronic kidney disease, unspecified: Secondary | ICD-10-CM | POA: Diagnosis not present

## 2015-09-18 DIAGNOSIS — Z79899 Other long term (current) drug therapy: Secondary | ICD-10-CM | POA: Diagnosis not present

## 2015-09-18 DIAGNOSIS — R5383 Other fatigue: Secondary | ICD-10-CM | POA: Diagnosis not present

## 2015-09-19 DIAGNOSIS — Z09 Encounter for follow-up examination after completed treatment for conditions other than malignant neoplasm: Secondary | ICD-10-CM | POA: Diagnosis not present

## 2015-09-19 DIAGNOSIS — M174 Other bilateral secondary osteoarthritis of knee: Secondary | ICD-10-CM | POA: Diagnosis not present

## 2015-09-19 DIAGNOSIS — M0579 Rheumatoid arthritis with rheumatoid factor of multiple sites without organ or systems involvement: Secondary | ICD-10-CM | POA: Diagnosis not present

## 2015-09-19 DIAGNOSIS — M25561 Pain in right knee: Secondary | ICD-10-CM | POA: Diagnosis not present

## 2015-09-28 DIAGNOSIS — K644 Residual hemorrhoidal skin tags: Secondary | ICD-10-CM | POA: Diagnosis not present

## 2015-09-28 DIAGNOSIS — K5730 Diverticulosis of large intestine without perforation or abscess without bleeding: Secondary | ICD-10-CM | POA: Diagnosis not present

## 2015-09-28 DIAGNOSIS — K5900 Constipation, unspecified: Secondary | ICD-10-CM | POA: Diagnosis not present

## 2015-11-23 DIAGNOSIS — K5900 Constipation, unspecified: Secondary | ICD-10-CM | POA: Diagnosis not present

## 2015-11-23 DIAGNOSIS — K5730 Diverticulosis of large intestine without perforation or abscess without bleeding: Secondary | ICD-10-CM | POA: Diagnosis not present

## 2015-11-23 DIAGNOSIS — K644 Residual hemorrhoidal skin tags: Secondary | ICD-10-CM | POA: Diagnosis not present

## 2015-11-29 DIAGNOSIS — H3581 Retinal edema: Secondary | ICD-10-CM | POA: Diagnosis not present

## 2015-11-29 DIAGNOSIS — H348192 Central retinal vein occlusion, unspecified eye, stable: Secondary | ICD-10-CM | POA: Diagnosis not present

## 2015-11-30 DIAGNOSIS — H34811 Central retinal vein occlusion, right eye, with macular edema: Secondary | ICD-10-CM | POA: Diagnosis not present

## 2015-12-01 DIAGNOSIS — M25462 Effusion, left knee: Secondary | ICD-10-CM | POA: Diagnosis not present

## 2015-12-01 DIAGNOSIS — R2242 Localized swelling, mass and lump, left lower limb: Secondary | ICD-10-CM | POA: Diagnosis not present

## 2015-12-07 DIAGNOSIS — H35033 Hypertensive retinopathy, bilateral: Secondary | ICD-10-CM | POA: Diagnosis not present

## 2015-12-07 DIAGNOSIS — H35372 Puckering of macula, left eye: Secondary | ICD-10-CM | POA: Diagnosis not present

## 2015-12-07 DIAGNOSIS — H34811 Central retinal vein occlusion, right eye, with macular edema: Secondary | ICD-10-CM | POA: Diagnosis not present

## 2015-12-14 DIAGNOSIS — H34811 Central retinal vein occlusion, right eye, with macular edema: Secondary | ICD-10-CM | POA: Diagnosis not present

## 2015-12-26 DIAGNOSIS — M79662 Pain in left lower leg: Secondary | ICD-10-CM | POA: Diagnosis not present

## 2015-12-26 DIAGNOSIS — R2242 Localized swelling, mass and lump, left lower limb: Secondary | ICD-10-CM | POA: Diagnosis not present

## 2015-12-26 LAB — BASIC METABOLIC PANEL
BUN: 16 mg/dL (ref 4–21)
Creatinine: 0.7 mg/dL (ref 0.5–1.1)
Glucose: 108 mg/dL
Potassium: 4.7 mmol/L (ref 3.4–5.3)
Sodium: 141 mmol/L (ref 137–147)

## 2015-12-26 LAB — CBC AND DIFFERENTIAL
HCT: 37 % (ref 36–46)
Hemoglobin: 12.8 g/dL (ref 12.0–16.0)
Platelets: 204 10*3/uL (ref 150–399)
WBC: 5.4 10^3/mL

## 2015-12-26 LAB — HEPATIC FUNCTION PANEL
ALT: 29 U/L (ref 7–35)
AST: 32 U/L (ref 13–35)
Alkaline Phosphatase: 64 U/L (ref 25–125)
Bilirubin, Total: 0.8 mg/dL

## 2016-01-11 DIAGNOSIS — E1122 Type 2 diabetes mellitus with diabetic chronic kidney disease: Secondary | ICD-10-CM | POA: Diagnosis not present

## 2016-01-11 DIAGNOSIS — H348192 Central retinal vein occlusion, unspecified eye, stable: Secondary | ICD-10-CM | POA: Diagnosis not present

## 2016-01-11 DIAGNOSIS — M353 Polymyalgia rheumatica: Secondary | ICD-10-CM | POA: Diagnosis not present

## 2016-01-11 DIAGNOSIS — F330 Major depressive disorder, recurrent, mild: Secondary | ICD-10-CM | POA: Diagnosis not present

## 2016-01-11 DIAGNOSIS — N183 Chronic kidney disease, stage 3 (moderate): Secondary | ICD-10-CM | POA: Diagnosis not present

## 2016-01-11 DIAGNOSIS — Z79899 Other long term (current) drug therapy: Secondary | ICD-10-CM | POA: Diagnosis not present

## 2016-01-17 DIAGNOSIS — Z1231 Encounter for screening mammogram for malignant neoplasm of breast: Secondary | ICD-10-CM | POA: Diagnosis not present

## 2016-01-24 ENCOUNTER — Ambulatory Visit: Payer: Medicare Other | Admitting: Rheumatology

## 2016-01-25 DIAGNOSIS — H34811 Central retinal vein occlusion, right eye, with macular edema: Secondary | ICD-10-CM | POA: Diagnosis not present

## 2016-01-30 ENCOUNTER — Encounter: Payer: Self-pay | Admitting: *Deleted

## 2016-01-30 DIAGNOSIS — M810 Age-related osteoporosis without current pathological fracture: Secondary | ICD-10-CM | POA: Insufficient documentation

## 2016-01-30 DIAGNOSIS — F32A Depression, unspecified: Secondary | ICD-10-CM | POA: Insufficient documentation

## 2016-01-30 DIAGNOSIS — M51369 Other intervertebral disc degeneration, lumbar region without mention of lumbar back pain or lower extremity pain: Secondary | ICD-10-CM

## 2016-01-30 DIAGNOSIS — M5136 Other intervertebral disc degeneration, lumbar region: Secondary | ICD-10-CM

## 2016-01-30 DIAGNOSIS — M19041 Primary osteoarthritis, right hand: Secondary | ICD-10-CM

## 2016-01-30 DIAGNOSIS — M19071 Primary osteoarthritis, right ankle and foot: Secondary | ICD-10-CM

## 2016-01-30 DIAGNOSIS — M069 Rheumatoid arthritis, unspecified: Secondary | ICD-10-CM

## 2016-01-30 DIAGNOSIS — F329 Major depressive disorder, single episode, unspecified: Secondary | ICD-10-CM | POA: Insufficient documentation

## 2016-01-30 DIAGNOSIS — M19072 Primary osteoarthritis, left ankle and foot: Secondary | ICD-10-CM

## 2016-01-30 DIAGNOSIS — M170 Bilateral primary osteoarthritis of knee: Secondary | ICD-10-CM

## 2016-01-30 DIAGNOSIS — M1611 Unilateral primary osteoarthritis, right hip: Secondary | ICD-10-CM | POA: Insufficient documentation

## 2016-01-30 HISTORY — DX: Other intervertebral disc degeneration, lumbar region: M51.36

## 2016-01-30 HISTORY — DX: Primary osteoarthritis, right hand: M19.041

## 2016-01-30 HISTORY — DX: Other intervertebral disc degeneration, lumbar region without mention of lumbar back pain or lower extremity pain: M51.369

## 2016-01-30 HISTORY — DX: Rheumatoid arthritis, unspecified: M06.9

## 2016-01-30 HISTORY — DX: Primary osteoarthritis, left ankle and foot: M19.071

## 2016-01-30 HISTORY — DX: Bilateral primary osteoarthritis of knee: M17.0

## 2016-01-30 NOTE — Progress Notes (Signed)
*IMAGE* Office Visit Note  Patient: Beverly Estrada             Date of Birth: December 09, 1929           MRN: [id number]             PCP: Gilford Rile, MD Referring: Raina Mina., MD Visit Date: 02/01/2016 Occupation: Retired  Subjective:  Lower back pain.   History of Present Illness: Beverly Estrada is a 82 y.o. female with history of seronegative rheumatoid arthritis and polymyalgia rheumatica she states that she never did stop methotrexate after the last visit she continues to take methotrexate him 3 tablets per week which she is tolerating well along with folic acid. She's on prednisone 2 mg a day which she is tolerating well she denies any muscle weakness or tenderness she does have some discomfort in her right knee joint off and on her main concern is lower back pain currently she states she has some discomfort in the lower back and right SI joint area. She also complains of bilateral trochanteric bursa pain. She has some stiffness in her hands feet and knee joints from osteoarthritis but no joint swelling.  Activities of Daily Living:  Patient reports morning stiffness for minutes.   Patient Reports nocturnal pain.  Difficulty dressing/grooming: Reports Difficulty climbing stairs: Reports Difficulty getting out of chair: Reports Difficulty using hands for taps, buttons, cutlery, and/or writing: Denies   Review of Systems  Constitutional: Positive for fatigue and weakness. Negative for night sweats, weight gain and weight loss.  HENT: Positive for mouth sores. Negative for trouble swallowing, trouble swallowing, mouth dryness and nose dryness.   Eyes: Negative for pain, redness, visual disturbance and dryness.  Respiratory: Negative for cough, shortness of breath and difficulty breathing.   Cardiovascular: Positive for hypertension. Negative for chest pain, palpitations, irregular heartbeat and swelling in legs/feet.  Gastrointestinal: Positive for constipation. Negative for  blood in stool and diarrhea.  Endocrine: Negative for increased urination.  Genitourinary: Negative for vaginal dryness.  Musculoskeletal: Positive for arthralgias, joint pain and morning stiffness. Negative for joint swelling, myalgias, muscle weakness, muscle tenderness and myalgias.  Skin: Negative for color change, rash, hair loss, skin tightness, ulcers and sensitivity to sunlight.  Allergic/Immunologic: Negative for susceptible to infections.  Neurological: Positive for memory loss. Negative for dizziness and night sweats.  Hematological: Negative for swollen glands.  Psychiatric/Behavioral: Positive for sleep disturbance. Negative for depressed mood. The patient is not nervous/anxious.   All other systems reviewed and are negative.   PMFS History:  Patient Active Problem List   Diagnosis Date Noted  . Osteopenia 02/01/2016  . RA (rheumatoid arthritis) ([place]) 01/30/2016  . Osteoarthritis of both knees 01/30/2016  . Osteoarthritis of both feet 01/30/2016  . DDD (degenerative disc disease), lumbar 01/30/2016  . Primary osteoarthritis of right hip 01/30/2016  . Depression 01/30/2016  . Choledocholithiasis 03/13/2015  . Hypothyroidism 03/13/2015  . Polymyalgia rheumatica ([place]) 03/13/2015    Past Medical History:  Diagnosis Date  . Arthritis   . DDD (degenerative disc disease), lumbar 01/30/2016  . Glaucoma   . Hypothyroidism   . Multiple drug allergies    to multiple antibiotics.   . Osteoarthritis of both feet 01/30/2016  . Osteoarthritis of both hands 01/30/2016  . Osteoarthritis of both knees 01/30/2016  . Osteoarthritis of right hand 01/30/2016   moderate  . Polymyalgia rheumatica ([place])   . RA (rheumatoid arthritis) ([place]) 01/30/2016   Sero Negative  . Skin cancer of nose   .  Stroke [facility])     Family History  Problem Relation Age of Onset  . Diabetes Mellitus II Mother   . Stroke Mother   . Diabetes Mellitus II Brother   . Stroke Brother   . Diabetes Sister     Past Surgical History:  Procedure Laterality Date  . APPENDECTOMY    . CHOLECYSTECTOMY    . COLONOSCOPY  ~2013   pooyps removed, one of them large per pt. Polyp pathology unclear. no rescreening study planned due to age  . TONSIL SURGERY     Social History   Social History Narrative  . No narrative on file     Objective: Vital Signs: BP (!) 142/54 (BP Location: Left Arm, Patient Position: Sitting, Cuff Size: Large)   Pulse 67   Resp 14   Ht 5' 5.5" (1.664 m)   Wt 146 lb (66.2 kg)   BMI 23.93 kg/m    Physical Exam  Constitutional: She is oriented to person, place, and time. She appears well-developed and well-nourished.  HENT:  Head: Normocephalic and atraumatic.  Eyes: Conjunctivae and EOM are normal.  Neck: Normal range of motion.  Cardiovascular: Normal rate, regular rhythm, normal heart sounds and intact distal pulses.   Pulmonary/Chest: Effort normal and breath sounds normal.  Abdominal: Soft. Bowel sounds are normal.  Musculoskeletal: Normal range of motion.  Neurological: She is alert and oriented to person, place, and time.  Skin: Skin is warm and dry. Capillary refill takes 2 to 3 seconds.  Psychiatric: She has a normal mood and affect. Her behavior is normal.  Nursing note and vitals reviewed.    Musculoskeletal Exam: C-spine good range of motion she has some thoracic kyphosis, she is some discomfort with range of motion of her lumbar spine. She is some tenderness over right SI joint. And tenderness over bilateral trochanteric bursa. Shoulder joints, elbow joints, wrist joints are good range of motion. She is no MCP joint swelling or tenderness. She has thickening of bilateral PIP/DIP joints. She has good range of motion of bilateral hip joints knee joints, ankle joints, MTPs PIPs DIPs with no synovitis.  CDAI Exam: No CDAI exam completed.    Investigation: Findings:  12/26/2015 CBC normal CMP normal, February 2015 TB: Negative, hepatitis negative, DEXA  November 2016 T score -2.5    Imaging: No results found.  Speciality Comments: No specialty comments available.    Procedures:  No procedures performed Allergies: Biaxin [clarithromycin]; Clarithromycin; Augmentin [amoxicillin-pot clavulanate]; Clindamycin; and Clindamycin/lincomycin   Assessment / Plan: Visit Diagnoses: Rheumatoid arthritis of multiple sites with negative rheumatoid factor She has no synovitis on examination. She will continue methotrexate. High risk medication: Her labs are stable and she will continue to get labs every 3 months and [place] area   Polymyalgia rheumatica : She is on prednisone 2 mg a day. Janeal Holmes advised her to taper to 1 mg if tolerated.  Primary osteoarthritis of both knees: Some discomfort  Primary osteoarthritis of both feet: Proper fitting shoes were discussed. Bilateral trochanteric bursitis: Will refer her to physical therapy  DDD (degenerative disc disease), lumbar: Chronic pain she's been having increased pain recently. I will refer to physical therapy.  Osteopenia: She has history of osteopenia. She is also on long-term prednisone use. Advised her to get bone density with her PCP and treatment can be advised accordingly.  Patient reports recent right eye vision loss due to retinal artery occlusion.      Face-to-face time spent with patient was 30 minutes. 50% of  time was spent in counseling and coordination of care.  Follow-Up Instructions: Return in about 5 months (around 07/01/2016) for Rheumatoid arthritis.     Bo Merino, MD

## 2016-02-01 ENCOUNTER — Encounter: Payer: Self-pay | Admitting: Rheumatology

## 2016-02-01 ENCOUNTER — Ambulatory Visit (INDEPENDENT_AMBULATORY_CARE_PROVIDER_SITE_OTHER): Payer: Medicare Other | Admitting: Rheumatology

## 2016-02-01 VITALS — BP 142/54 | HR 67 | Resp 14 | Ht 65.5 in | Wt 146.0 lb

## 2016-02-01 DIAGNOSIS — Z5181 Encounter for therapeutic drug level monitoring: Secondary | ICD-10-CM

## 2016-02-01 DIAGNOSIS — M19072 Primary osteoarthritis, left ankle and foot: Secondary | ICD-10-CM

## 2016-02-01 DIAGNOSIS — M0609 Rheumatoid arthritis without rheumatoid factor, multiple sites: Secondary | ICD-10-CM | POA: Diagnosis not present

## 2016-02-01 DIAGNOSIS — M810 Age-related osteoporosis without current pathological fracture: Secondary | ICD-10-CM

## 2016-02-01 DIAGNOSIS — M8580 Other specified disorders of bone density and structure, unspecified site: Secondary | ICD-10-CM | POA: Insufficient documentation

## 2016-02-01 DIAGNOSIS — M170 Bilateral primary osteoarthritis of knee: Secondary | ICD-10-CM

## 2016-02-01 DIAGNOSIS — M19071 Primary osteoarthritis, right ankle and foot: Secondary | ICD-10-CM

## 2016-02-01 DIAGNOSIS — F329 Major depressive disorder, single episode, unspecified: Secondary | ICD-10-CM

## 2016-02-01 DIAGNOSIS — M353 Polymyalgia rheumatica: Secondary | ICD-10-CM

## 2016-02-01 DIAGNOSIS — M5136 Other intervertebral disc degeneration, lumbar region: Secondary | ICD-10-CM | POA: Diagnosis not present

## 2016-02-01 DIAGNOSIS — F32A Depression, unspecified: Secondary | ICD-10-CM

## 2016-02-01 DIAGNOSIS — M1611 Unilateral primary osteoarthritis, right hip: Secondary | ICD-10-CM | POA: Diagnosis not present

## 2016-02-01 NOTE — Patient Instructions (Signed)
Standing Labs We placed an order today for your standing lab work.    Please come back and get your standing labs in December and every 3 months  We have open lab Monday through Friday from 8:30-11:30 AM and 1-4 PM at the office of Dr. Tresa Moore, PA.   The office is located at [address] [place], [place], [place] [address] No appointment is necessary.   Labs are drawn by [facility].  You may receive a bill from [facility] for your lab work.     I advised patient to get bone density with her PCP. She will go to physical therapy and [place] no order was given.

## 2016-02-06 ENCOUNTER — Telehealth: Payer: Self-pay | Admitting: Rheumatology

## 2016-02-06 NOTE — Telephone Encounter (Signed)
Beverly Estrada needs a signed physical therapy order for the patient. The patient is being seen tomorrow. They have an order but it is not signed. Please advise.  Fax#[phone]

## 2016-02-06 NOTE — Telephone Encounter (Signed)
It was an Brewing technologist, have faxed with hardcopy signature, thanks

## 2016-02-07 DIAGNOSIS — M545 Low back pain: Secondary | ICD-10-CM | POA: Diagnosis not present

## 2016-02-07 DIAGNOSIS — M25551 Pain in right hip: Secondary | ICD-10-CM | POA: Diagnosis not present

## 2016-02-07 DIAGNOSIS — M25552 Pain in left hip: Secondary | ICD-10-CM | POA: Diagnosis not present

## 2016-02-09 DIAGNOSIS — G453 Amaurosis fugax: Secondary | ICD-10-CM | POA: Diagnosis not present

## 2016-02-12 DIAGNOSIS — M25552 Pain in left hip: Secondary | ICD-10-CM | POA: Diagnosis not present

## 2016-02-12 DIAGNOSIS — M25551 Pain in right hip: Secondary | ICD-10-CM | POA: Diagnosis not present

## 2016-02-12 DIAGNOSIS — M545 Low back pain: Secondary | ICD-10-CM | POA: Diagnosis not present

## 2016-02-15 DIAGNOSIS — M25551 Pain in right hip: Secondary | ICD-10-CM | POA: Diagnosis not present

## 2016-02-15 DIAGNOSIS — M545 Low back pain: Secondary | ICD-10-CM | POA: Diagnosis not present

## 2016-02-15 DIAGNOSIS — M25552 Pain in left hip: Secondary | ICD-10-CM | POA: Diagnosis not present

## 2016-02-19 DIAGNOSIS — E1122 Type 2 diabetes mellitus with diabetic chronic kidney disease: Secondary | ICD-10-CM | POA: Diagnosis not present

## 2016-02-19 DIAGNOSIS — Z79899 Other long term (current) drug therapy: Secondary | ICD-10-CM | POA: Diagnosis not present

## 2016-02-19 DIAGNOSIS — E039 Hypothyroidism, unspecified: Secondary | ICD-10-CM | POA: Diagnosis not present

## 2016-02-19 DIAGNOSIS — M353 Polymyalgia rheumatica: Secondary | ICD-10-CM | POA: Diagnosis not present

## 2016-02-19 DIAGNOSIS — G453 Amaurosis fugax: Secondary | ICD-10-CM | POA: Diagnosis not present

## 2016-02-19 DIAGNOSIS — N183 Chronic kidney disease, stage 3 (moderate): Secondary | ICD-10-CM | POA: Diagnosis not present

## 2016-02-19 DIAGNOSIS — Z780 Asymptomatic menopausal state: Secondary | ICD-10-CM | POA: Diagnosis not present

## 2016-02-21 DIAGNOSIS — G453 Amaurosis fugax: Secondary | ICD-10-CM | POA: Diagnosis not present

## 2016-02-21 DIAGNOSIS — H353132 Nonexudative age-related macular degeneration, bilateral, intermediate dry stage: Secondary | ICD-10-CM | POA: Diagnosis not present

## 2016-02-23 DIAGNOSIS — M545 Low back pain: Secondary | ICD-10-CM | POA: Diagnosis not present

## 2016-02-23 DIAGNOSIS — M25551 Pain in right hip: Secondary | ICD-10-CM | POA: Diagnosis not present

## 2016-02-23 DIAGNOSIS — M25552 Pain in left hip: Secondary | ICD-10-CM | POA: Diagnosis not present

## 2016-02-28 DIAGNOSIS — M25551 Pain in right hip: Secondary | ICD-10-CM | POA: Diagnosis not present

## 2016-02-28 DIAGNOSIS — M25552 Pain in left hip: Secondary | ICD-10-CM | POA: Diagnosis not present

## 2016-02-28 DIAGNOSIS — M545 Low back pain: Secondary | ICD-10-CM | POA: Diagnosis not present

## 2016-02-29 DIAGNOSIS — H34811 Central retinal vein occlusion, right eye, with macular edema: Secondary | ICD-10-CM | POA: Diagnosis not present

## 2016-03-01 DIAGNOSIS — M25552 Pain in left hip: Secondary | ICD-10-CM | POA: Diagnosis not present

## 2016-03-01 DIAGNOSIS — M25551 Pain in right hip: Secondary | ICD-10-CM | POA: Diagnosis not present

## 2016-03-01 DIAGNOSIS — M545 Low back pain: Secondary | ICD-10-CM | POA: Diagnosis not present

## 2016-03-05 DIAGNOSIS — M25551 Pain in right hip: Secondary | ICD-10-CM | POA: Diagnosis not present

## 2016-03-05 DIAGNOSIS — M25552 Pain in left hip: Secondary | ICD-10-CM | POA: Diagnosis not present

## 2016-03-05 DIAGNOSIS — M545 Low back pain: Secondary | ICD-10-CM | POA: Diagnosis not present

## 2016-03-08 DIAGNOSIS — M545 Low back pain: Secondary | ICD-10-CM | POA: Diagnosis not present

## 2016-03-08 DIAGNOSIS — M25551 Pain in right hip: Secondary | ICD-10-CM | POA: Diagnosis not present

## 2016-03-08 DIAGNOSIS — M25552 Pain in left hip: Secondary | ICD-10-CM | POA: Diagnosis not present

## 2016-03-13 DIAGNOSIS — G453 Amaurosis fugax: Secondary | ICD-10-CM | POA: Diagnosis not present

## 2016-03-13 DIAGNOSIS — R609 Edema, unspecified: Secondary | ICD-10-CM | POA: Diagnosis not present

## 2016-03-13 DIAGNOSIS — R5383 Other fatigue: Secondary | ICD-10-CM | POA: Diagnosis not present

## 2016-03-14 DIAGNOSIS — M25552 Pain in left hip: Secondary | ICD-10-CM | POA: Diagnosis not present

## 2016-03-14 DIAGNOSIS — M25551 Pain in right hip: Secondary | ICD-10-CM | POA: Diagnosis not present

## 2016-03-14 DIAGNOSIS — M545 Low back pain: Secondary | ICD-10-CM | POA: Diagnosis not present

## 2016-03-19 DIAGNOSIS — E1122 Type 2 diabetes mellitus with diabetic chronic kidney disease: Secondary | ICD-10-CM | POA: Diagnosis not present

## 2016-03-19 DIAGNOSIS — M353 Polymyalgia rheumatica: Secondary | ICD-10-CM | POA: Diagnosis not present

## 2016-03-19 DIAGNOSIS — Z79899 Other long term (current) drug therapy: Secondary | ICD-10-CM | POA: Diagnosis not present

## 2016-03-19 DIAGNOSIS — E039 Hypothyroidism, unspecified: Secondary | ICD-10-CM | POA: Diagnosis not present

## 2016-03-19 DIAGNOSIS — F330 Major depressive disorder, recurrent, mild: Secondary | ICD-10-CM | POA: Diagnosis not present

## 2016-03-19 DIAGNOSIS — N183 Chronic kidney disease, stage 3 (moderate): Secondary | ICD-10-CM | POA: Diagnosis not present

## 2016-03-19 DIAGNOSIS — E782 Mixed hyperlipidemia: Secondary | ICD-10-CM | POA: Diagnosis not present

## 2016-03-19 DIAGNOSIS — N750 Cyst of Bartholin's gland: Secondary | ICD-10-CM | POA: Diagnosis not present

## 2016-03-20 DIAGNOSIS — M25552 Pain in left hip: Secondary | ICD-10-CM | POA: Diagnosis not present

## 2016-03-20 DIAGNOSIS — M545 Low back pain: Secondary | ICD-10-CM | POA: Diagnosis not present

## 2016-03-20 DIAGNOSIS — M25551 Pain in right hip: Secondary | ICD-10-CM | POA: Diagnosis not present

## 2016-03-27 ENCOUNTER — Other Ambulatory Visit: Payer: Self-pay | Admitting: Rheumatology

## 2016-03-28 ENCOUNTER — Other Ambulatory Visit: Payer: Self-pay | Admitting: *Deleted

## 2016-03-28 DIAGNOSIS — Z79899 Other long term (current) drug therapy: Secondary | ICD-10-CM

## 2016-03-28 NOTE — Telephone Encounter (Signed)
Last Visit: 02/01/16 Next Visit: 07/04/16 Labs: 12/26/15 WNL Patient updating the labs today.  Okay to refill MTX?

## 2016-04-11 ENCOUNTER — Telehealth (INDEPENDENT_AMBULATORY_CARE_PROVIDER_SITE_OTHER): Payer: Self-pay | Admitting: Rheumatology

## 2016-04-11 NOTE — Telephone Encounter (Signed)
Patient is requesting to speak with Beverly Estrada to verify her blood work was okay from her office visit 2 weeks ago.  Cb#: [phone]

## 2016-04-12 DIAGNOSIS — H34811 Central retinal vein occlusion, right eye, with macular edema: Secondary | ICD-10-CM | POA: Diagnosis not present

## 2016-04-12 NOTE — Telephone Encounter (Signed)
Left message for patient to return call to the office. Advised we have not received lab results.

## 2016-04-19 ENCOUNTER — Encounter: Payer: Self-pay | Admitting: Rheumatology

## 2016-04-19 DIAGNOSIS — Z79899 Other long term (current) drug therapy: Secondary | ICD-10-CM | POA: Diagnosis not present

## 2016-04-22 ENCOUNTER — Telehealth: Payer: Self-pay | Admitting: Radiology

## 2016-04-22 ENCOUNTER — Telehealth: Payer: Self-pay | Admitting: Rheumatology

## 2016-04-22 NOTE — Telephone Encounter (Signed)
Patient would like to know lab results from last week.

## 2016-04-22 NOTE — Telephone Encounter (Signed)
I have received the information and called patient to advise labs WNL except for elevated Glucose. She voiced understanding. Labs were sent for scanning

## 2016-04-22 NOTE — Telephone Encounter (Signed)
They may be in a fax pile there are several  Have called patient to ask her to have them resent to my attention. I have asked her to resend to my attention.

## 2016-04-22 NOTE — Telephone Encounter (Signed)
I have finally received the labs/ from [facility] I have advised patient CBC from Dec normal and CMP from Jan was normal except for elevated Glucose. She is very upset she did not get a call sooner. I have apologized, will you call her also ?

## 2016-04-25 ENCOUNTER — Other Ambulatory Visit: Payer: Self-pay | Admitting: Rheumatology

## 2016-04-25 NOTE — Telephone Encounter (Signed)
ok 

## 2016-04-25 NOTE — Telephone Encounter (Signed)
Last Visit: 02/01/16 Next Visit: 07/04/16  Okay to refill prednisone?

## 2016-05-29 DIAGNOSIS — L820 Inflamed seborrheic keratosis: Secondary | ICD-10-CM | POA: Diagnosis not present

## 2016-05-29 DIAGNOSIS — L601 Onycholysis: Secondary | ICD-10-CM | POA: Diagnosis not present

## 2016-05-30 DIAGNOSIS — H34811 Central retinal vein occlusion, right eye, with macular edema: Secondary | ICD-10-CM | POA: Diagnosis not present

## 2016-06-04 ENCOUNTER — Other Ambulatory Visit (INDEPENDENT_AMBULATORY_CARE_PROVIDER_SITE_OTHER): Payer: Self-pay | Admitting: Rheumatology

## 2016-06-04 MED ORDER — FOLIC ACID 1 MG PO TABS: 1.0000 mg | ORAL_TABLET | Freq: Every day | ORAL | 4 refills | Status: DC

## 2016-06-04 NOTE — Telephone Encounter (Signed)
Last Visit: 02/01/16 Next Visit: 07/04/16  Okay to refill Folic Acid?

## 2016-06-04 NOTE — Telephone Encounter (Signed)
[facility] [address] in [place] Requesting refill on folic acid. Cb# [phone]

## 2016-06-13 DIAGNOSIS — E039 Hypothyroidism, unspecified: Secondary | ICD-10-CM | POA: Diagnosis not present

## 2016-06-13 DIAGNOSIS — Z7689 Persons encountering health services in other specified circumstances: Secondary | ICD-10-CM | POA: Diagnosis not present

## 2016-06-20 DIAGNOSIS — H9193 Unspecified hearing loss, bilateral: Secondary | ICD-10-CM | POA: Diagnosis not present

## 2016-06-20 DIAGNOSIS — Z974 Presence of external hearing-aid: Secondary | ICD-10-CM | POA: Diagnosis not present

## 2016-07-01 ENCOUNTER — Other Ambulatory Visit: Payer: Self-pay | Admitting: Rheumatology

## 2016-07-01 DIAGNOSIS — R410 Disorientation, unspecified: Secondary | ICD-10-CM | POA: Diagnosis not present

## 2016-07-01 DIAGNOSIS — N3000 Acute cystitis without hematuria: Secondary | ICD-10-CM | POA: Diagnosis not present

## 2016-07-02 ENCOUNTER — Other Ambulatory Visit: Payer: Self-pay | Admitting: Rheumatology

## 2016-07-02 DIAGNOSIS — Z79899 Other long term (current) drug therapy: Secondary | ICD-10-CM | POA: Insufficient documentation

## 2016-07-02 DIAGNOSIS — Z5181 Encounter for therapeutic drug level monitoring: Secondary | ICD-10-CM | POA: Insufficient documentation

## 2016-07-02 DIAGNOSIS — M7062 Trochanteric bursitis, left hip: Secondary | ICD-10-CM

## 2016-07-02 DIAGNOSIS — M7061 Trochanteric bursitis, right hip: Secondary | ICD-10-CM | POA: Insufficient documentation

## 2016-07-02 MED ORDER — METHOTREXATE 2.5 MG PO TABS: ORAL_TABLET | ORAL | 0 refills | Status: DC

## 2016-07-02 NOTE — Telephone Encounter (Signed)
Labs due now. Ok to give 30 d

## 2016-07-02 NOTE — Telephone Encounter (Signed)
Patient advised we would fill a 30 day supply okay per Dr. Estanislado Pandy. Patient is due for labs. Patient advised and states she will go today to update labs.

## 2016-07-02 NOTE — Progress Notes (Signed)
Office Visit Note  Patient: Beverly Estrada             Date of Birth: 1929-09-07           MRN: [id number]             PCP: Gilford Rile, MD Referring: Raina Mina., MD Visit Date: 07/04/2016 Occupation: @GUAROCC @    Subjective:  Follow-up (states not feeling well today )   History of Present Illness: Beverly Estrada is a 83 y.o. female  Last seen 02/01/2016 for rheumatoid arthritis & PMR   on MTX 3/week ; folic acid 1mg  qd ; prednisone 1 mg daily. (Was on 2 mg and was able to taper downward)  Recently was diagnosed with a UTI. Patient did not have any UTI symptoms but did have fogginess.  Beverly Estrada, FRIEND, ACCOMPANYING PATIENT. Patient states that her daughter asked Altha Harm to calm and accompany her today.      Activities of Daily Living:  Patient reports morning stiffness for 15 minute.   Patient Denies nocturnal pain.  Difficulty dressing/grooming: Denies Difficulty climbing stairs: Denies Difficulty getting out of chair: Denies Difficulty using hands for taps, buttons, cutlery, and/or writing: Denies   Review of Systems  Constitutional: Negative for fatigue.  HENT: Negative for mouth sores and mouth dryness.   Eyes: Negative for dryness.  Respiratory: Negative for shortness of breath.   Gastrointestinal: Negative for constipation and diarrhea.  Musculoskeletal: Negative for myalgias and myalgias.  Skin: Negative for sensitivity to sunlight.  Psychiatric/Behavioral: Negative for decreased concentration and sleep disturbance.    PMFS History:  Patient Active Problem List   Diagnosis Date Noted  . Medication monitoring encounter 07/02/2016  . Trochanteric bursitis of both hips 07/02/2016  . High risk medication use 07/02/2016  . Osteopenia 02/01/2016  . RA (rheumatoid arthritis) ([place]) 01/30/2016  . Osteoarthritis of both knees 01/30/2016  . Osteoarthritis of both feet 01/30/2016  . DDD (degenerative disc disease), lumbar 01/30/2016  . Primary  osteoarthritis of right hip 01/30/2016  . Depression 01/30/2016  . Choledocholithiasis 03/13/2015  . Hypothyroidism 03/13/2015  . Polymyalgia rheumatica ([place]) 03/13/2015    Past Medical History:  Diagnosis Date  . Arthritis   . DDD (degenerative disc disease), lumbar 01/30/2016  . Glaucoma   . Hypothyroidism   . Multiple drug allergies    to multiple antibiotics.   . Osteoarthritis of both feet 01/30/2016  . Osteoarthritis of both hands 01/30/2016  . Osteoarthritis of both knees 01/30/2016  . Osteoarthritis of right hand 01/30/2016   moderate  . Polymyalgia rheumatica ([place])   . RA (rheumatoid arthritis) ([place]) 01/30/2016   Sero Negative  . Skin cancer of nose   . Stroke [facility])     Family History  Problem Relation Age of Onset  . Diabetes Mellitus II Mother   . Stroke Mother   . Diabetes Mellitus II Brother   . Stroke Brother   . Diabetes Sister    Past Surgical History:  Procedure Laterality Date  . APPENDECTOMY    . CHOLECYSTECTOMY    . COLONOSCOPY  ~2013   pooyps removed, one of them large per pt. Polyp pathology unclear. no rescreening study planned due to age  . TONSIL SURGERY     Social History   Social History Narrative  . No narrative on file     Objective: Vital Signs: BP (!) 146/70   Pulse 78   Resp 14   Wt 148 lb (67.1 kg)   BMI 24.25 kg/m  Physical Exam  Constitutional: She is oriented to person, place, and time. She appears well-developed and well-nourished.  HENT:  Head: Normocephalic and atraumatic.  Eyes: EOM are normal. Pupils are equal, round, and reactive to light.  Cardiovascular: Normal rate, regular rhythm and normal heart sounds.  Exam reveals no gallop and no friction rub.   No murmur heard. Pulmonary/Chest: Effort normal and breath sounds normal. She has no wheezes. She has no rales.  Abdominal: Soft. Bowel sounds are normal. She exhibits no distension. There is no tenderness. There is no guarding. No hernia.  Musculoskeletal:  Normal range of motion. She exhibits no edema, tenderness or deformity.  Lymphadenopathy:    She has no cervical adenopathy.  Neurological: She is alert and oriented to person, place, and time. Coordination normal.  Skin: Skin is warm and dry. Capillary refill takes less than 2 seconds. No rash noted.  Psychiatric: She has a normal mood and affect. Her behavior is normal.  Nursing note and vitals reviewed.    Musculoskeletal Exam:  Full range of motion of all joints Grip strength is equal strong bilaterally For myalgia tender points are all absent  CDAI Exam: CDAI Homunculus Exam:   Tenderness:  Right hand: 2nd MCP and 3rd MCP  Swelling:  Right hand: 2nd MCP and 3rd MCP  Joint Counts:  CDAI Tender Joint count: 2 CDAI Swollen Joint count: 2  Global Assessments:  Patient Global Assessment: 1 Provider Global Assessment: 1  CDAI Calculated Score: 6 Right second and third MCP joint with mild synovial thickening with mild tenderness to palpation. Patient rates her discomfort as a 1 on a scale of 0-10. I would like to follow with patient in 3 months to verify that this all resolves   Investigation: Findings:  12/26/2015 CBC normal CMP normal, February 2015 TB: Negative, hepatitis negative, DEXA November 2016 T score -2.5  Advised her to get bone density with her PCP and treatment can be advised accordingly.  Patient reports recent right eye vision loss due to retinal artery occlusion.  Abstract on 01/30/2016  Component Date Value Ref Range Status  . Hemoglobin 12/26/2015 12.8  12.0 - 16.0 g/dL Final  . HCT 12/26/2015 37  36 - 46 % Final  . Platelets 12/26/2015 204  150 - 399 K/L Final  . WBC 12/26/2015 5.4  10^3/mL Final  . Glucose 12/26/2015 108  mg/dL Final  . BUN 12/26/2015 16  4 - 21 mg/dL Final  . Creatinine 12/26/2015 0.7  0.5 - 1.1 mg/dL Final  . Potassium 12/26/2015 4.7  3.4 - 5.3 mmol/L Final  . Sodium 12/26/2015 141  137 - 147 mmol/L Final  . Alkaline  Phosphatase 12/26/2015 64  25 - 125 U/L Final  . ALT 12/26/2015 29  7 - 35 U/L Final  . AST 12/26/2015 32  13 - 35 U/L Final  . Bilirubin, Total 12/26/2015 0.8  mg/dL Final      Imaging: No results found.  Speciality Comments: No specialty comments available.    Procedures:  No procedures performed Allergies: Biaxin [clarithromycin]; Clarithromycin; Augmentin [amoxicillin-pot clavulanate]; Clindamycin; and Clindamycin/lincomycin   Assessment / Plan:     Visit Diagnoses: Rheumatoid arthritis of multiple sites with negative rheumatoid factor (HCC)  High risk medication use - MTX- 2.5mg  tablet  TAKE 3 TABLETS BY MOUTH EVERY WEEKprednisone 2 mg a day - Plan: CBC with Differential/Platelet, COMPLETE METABOLIC PANEL WITH GFR, Urinalysis, Routine w reflex microscopic, Urine Culture  Polymyalgia rheumatica (HCC)  Primary osteoarthritis of both knees  Primary osteoarthritis of both feet  Primary osteoarthritis of right hip  DDD (degenerative disc disease), lumbar  Other depression  Age related osteoporosis, unspecified pathological fracture presence  Medication monitoring encounter  Osteopenia of multiple sites  Trochanteric bursitis of both hips  Acute cystitis without hematuria - Plan: Urinalysis, Routine w reflex microscopic, Urine Culture    Plan: #1: Rheumatoid arthritis. Doing well currently. Taking methotrexate 3 pills per week and folic acid 1 mg daily. Patient is ON prednisone but has tapered down to 1 mg and is doing well.  Currently on examination her right second and third MCP joint are tender with mild synovitis. We will monitor this. Patient rates her discomfort as a 1 on a scale of 0-10 and we do not need to increase her prednisone or her methotrexate at this time.  #2: High risk prescription. Methotrexate 3 per week and folic acid 1 per day and prednisone 1 mg daily.  #3: CBC with differential and CMP with GFR were done 04/19/2016 and we do have  scanned lab report from [facility]. There were within normal limits.  #4: Patient is due for labs once again and due to her current UTI symptoms, I will do a CBC with differential and CMP with GFR for our office and we will do a urinary analysis with culture and sensitivity since patient just recently finished treatment for UTI. She did not have any urinary tract infection symptoms other than fogginess. We want to make sure the patient recovers from this without any consequences.  #5: Refill methotrexate 3 per week ninety-day supply with no refills; refill folic acid 1 mg daily 90 day supply with 4 refills; refill prednisone (patient is on a new dose and has tapered down from 2 mg daily to 1 mg daily; 90 pills of 1 mg prednisone with 1 refill)  #6: Return to clinic in 3 months  Orders: Orders Placed This Encounter  Procedures  . Urine Culture  . CBC with Differential/Platelet  . COMPLETE METABOLIC PANEL WITH GFR  . Urinalysis, Routine w reflex microscopic   Meds ordered this encounter  Medications  . methotrexate (RHEUMATREX) 2.5 MG tablet    Sig: TAKE 3 TABLETS BY MOUTH EVERY WEEK    Dispense:  12 tablet    Refill:  0    Order Specific Question:   Supervising Provider    Answer:   Bo Merino [[id number]]  . folic acid (FOLVITE) 1 MG tablet    Sig: Take 1 tablet (1 mg total) by mouth daily.    Dispense:  90 tablet    Refill:  4    Order Specific Question:   Supervising Provider    Answer:   Bo Merino [[id number]]  . predniSONE (DELTASONE) 1 MG tablet    Sig: Take 1 tablet (1 mg total) by mouth daily with breakfast.    Dispense:  90 tablet    Refill:  1    Order Specific Question:   Supervising Provider    Answer:   Lyda Perone    Face-to-face time spent with patient was 30 minutes. 50% of time was spent in counseling and coordination of care.  Follow-Up Instructions: Return in about 3 months (around 10/03/2016) for RA,pmr, MTX 3/wk, folic 1/d, prednisone  1mg  qAM // recent UTI - almost resolved.Eliezer Lofts, PA-C  Note - This record has been created using Editor, commissioning.  Chart creation errors have been sought, but may not always  have been located. Such  creation errors do not reflect on  the standard of medical care.

## 2016-07-02 NOTE — Telephone Encounter (Signed)
Last Visit: 02/01/16 Next Visit: 07/04/16 Labs: 04/19/16 WNL  Okay to refill MTX?

## 2016-07-02 NOTE — Telephone Encounter (Signed)
Patient's daughter called inquiring about her mother's MTX.  She wants to know if it has been refilled.  CB#[phone].  [facility] [place]

## 2016-07-03 ENCOUNTER — Other Ambulatory Visit: Payer: Self-pay | Admitting: Rheumatology

## 2016-07-03 ENCOUNTER — Other Ambulatory Visit: Payer: Self-pay | Admitting: *Deleted

## 2016-07-03 MED ORDER — METHOTREXATE 2.5 MG PO TABS: ORAL_TABLET | ORAL | 0 refills | Status: DC

## 2016-07-03 NOTE — Telephone Encounter (Signed)
[facility] requesting refills on the patient's MTX 2.5mg  prescription.  CB#[phone].  Thank you.

## 2016-07-03 NOTE — Telephone Encounter (Signed)
Prescription resent to the correct pharmacy.  

## 2016-07-03 NOTE — Telephone Encounter (Signed)
Please res[facility] [address] [facility] [place]. It was sent to [facility]

## 2016-07-03 NOTE — Telephone Encounter (Signed)
Prescription sent to the pharmacy on 07/02/16

## 2016-07-04 ENCOUNTER — Telehealth: Payer: Self-pay | Admitting: Rheumatology

## 2016-07-04 ENCOUNTER — Encounter: Payer: Self-pay | Admitting: Rheumatology

## 2016-07-04 ENCOUNTER — Ambulatory Visit (INDEPENDENT_AMBULATORY_CARE_PROVIDER_SITE_OTHER): Payer: Medicare Other | Admitting: Rheumatology

## 2016-07-04 VITALS — BP 146/70 | HR 78 | Resp 14 | Wt 148.0 lb

## 2016-07-04 DIAGNOSIS — N3000 Acute cystitis without hematuria: Secondary | ICD-10-CM

## 2016-07-04 DIAGNOSIS — Z5181 Encounter for therapeutic drug level monitoring: Secondary | ICD-10-CM

## 2016-07-04 DIAGNOSIS — M810 Age-related osteoporosis without current pathological fracture: Secondary | ICD-10-CM | POA: Diagnosis not present

## 2016-07-04 DIAGNOSIS — M1611 Unilateral primary osteoarthritis, right hip: Secondary | ICD-10-CM

## 2016-07-04 DIAGNOSIS — M8589 Other specified disorders of bone density and structure, multiple sites: Secondary | ICD-10-CM | POA: Diagnosis not present

## 2016-07-04 DIAGNOSIS — M7061 Trochanteric bursitis, right hip: Secondary | ICD-10-CM | POA: Diagnosis not present

## 2016-07-04 DIAGNOSIS — M5136 Other intervertebral disc degeneration, lumbar region: Secondary | ICD-10-CM

## 2016-07-04 DIAGNOSIS — F3289 Other specified depressive episodes: Secondary | ICD-10-CM | POA: Diagnosis not present

## 2016-07-04 DIAGNOSIS — M19072 Primary osteoarthritis, left ankle and foot: Secondary | ICD-10-CM

## 2016-07-04 DIAGNOSIS — M170 Bilateral primary osteoarthritis of knee: Secondary | ICD-10-CM | POA: Diagnosis not present

## 2016-07-04 DIAGNOSIS — Z79899 Other long term (current) drug therapy: Secondary | ICD-10-CM | POA: Diagnosis not present

## 2016-07-04 DIAGNOSIS — M7062 Trochanteric bursitis, left hip: Secondary | ICD-10-CM

## 2016-07-04 DIAGNOSIS — M19071 Primary osteoarthritis, right ankle and foot: Secondary | ICD-10-CM

## 2016-07-04 DIAGNOSIS — M353 Polymyalgia rheumatica: Secondary | ICD-10-CM | POA: Diagnosis not present

## 2016-07-04 DIAGNOSIS — M0609 Rheumatoid arthritis without rheumatoid factor, multiple sites: Secondary | ICD-10-CM

## 2016-07-04 LAB — COMPLETE METABOLIC PANEL WITH GFR
ALT: 8 U/L (ref 6–29)
AST: 18 U/L (ref 10–35)
Albumin: 4.6 g/dL (ref 3.6–5.1)
Alkaline Phosphatase: 60 U/L (ref 33–130)
BUN: 15 mg/dL (ref 7–25)
CO2: 26 mmol/L (ref 20–31)
Calcium: 10.1 mg/dL (ref 8.6–10.4)
Chloride: 102 mmol/L (ref 98–110)
Creat: 1.02 mg/dL — ABNORMAL HIGH (ref 0.60–0.88)
GFR, Est African American: 58 mL/min — ABNORMAL LOW (ref >=60)
GFR, Est Non African American: 50 mL/min — ABNORMAL LOW (ref >=60)
Glucose, Bld: 100 mg/dL — ABNORMAL HIGH (ref 65–99)
Potassium: 4.7 mmol/L (ref 3.5–5.3)
Sodium: 139 mmol/L (ref 135–146)
Total Bilirubin: 0.6 mg/dL (ref 0.2–1.2)
Total Protein: 7.7 g/dL (ref 6.1–8.1)

## 2016-07-04 LAB — CBC WITH DIFFERENTIAL/PLATELET
Basophils Absolute: 0 cells/uL (ref 0–200)
Basophils Relative: 0 %
Eosinophils Absolute: 0 cells/uL — ABNORMAL LOW (ref 15–500)
Eosinophils Relative: 0 %
HCT: 39.2 % (ref 35.0–45.0)
Hemoglobin: 13.1 g/dL (ref 11.7–15.5)
Lymphocytes Relative: 21 %
Lymphs Abs: 1344 cells/uL (ref 850–3900)
MCH: 30.7 pg (ref 27.0–33.0)
MCHC: 33.4 g/dL (ref 32.0–36.0)
MCV: 91.8 fL (ref 80.0–100.0)
MPV: 10 fL (ref 7.5–12.5)
Monocytes Absolute: 1152 cells/uL — ABNORMAL HIGH (ref 200–950)
Monocytes Relative: 18 %
Neutro Abs: 3904 cells/uL (ref 1500–7800)
Neutrophils Relative %: 61 %
Platelets: 288 10*3/uL (ref 140–400)
RBC: 4.27 MIL/uL (ref 3.80–5.10)
RDW: 14.6 % (ref 11.0–15.0)
WBC: 6.4 10*3/uL (ref 3.8–10.8)

## 2016-07-04 MED ORDER — PREDNISONE 1 MG PO TABS: 1.0000 mg | ORAL_TABLET | Freq: Every day | ORAL | 1 refills | Status: AC

## 2016-07-04 MED ORDER — FOLIC ACID 1 MG PO TABS: 1.0000 mg | ORAL_TABLET | Freq: Every day | ORAL | 4 refills | Status: DC

## 2016-07-04 MED ORDER — METHOTREXATE 2.5 MG PO TABS: ORAL_TABLET | ORAL | 0 refills | Status: DC

## 2016-07-04 NOTE — Telephone Encounter (Signed)
Patient's daughter called and wanted to know how soon the results would be on her mother's urine culture.  Please call her daughter at [phone]

## 2016-07-04 NOTE — Telephone Encounter (Signed)
Patient's daughter advised that the urine culture takes 48 hours to result. patient advised the blood work and the UA that was ordered should results tomorrow. Patient daughter request we call her with lab results and she would relay results to her mother.

## 2016-07-05 ENCOUNTER — Telehealth (INDEPENDENT_AMBULATORY_CARE_PROVIDER_SITE_OTHER): Payer: Self-pay | Admitting: Rheumatology

## 2016-07-05 LAB — URINALYSIS, ROUTINE W REFLEX MICROSCOPIC
Bilirubin Urine: NEGATIVE
Glucose, UA: NEGATIVE
Hgb urine dipstick: NEGATIVE
Ketones, ur: NEGATIVE
Leukocytes, UA: NEGATIVE
Nitrite: NEGATIVE
Protein, ur: NEGATIVE
Specific Gravity, Urine: 1.01 (ref 1.001–1.035)
pH: 7 (ref 5.0–8.0)

## 2016-07-05 LAB — URINE CULTURE: Organism ID, Bacteria: NO GROWTH

## 2016-07-05 NOTE — Telephone Encounter (Signed)
Pt called wanting to verify rx before she starts taking tonight. Also stated she only got 12 pills not typical 3 months worth.

## 2016-07-05 NOTE — Telephone Encounter (Signed)
Spoke with patient's daughter Jackelyn Poling. Advised her that the prescription was sent in for a one month supply due to the patient needing labs, which were updated 07/04/16. Michaell Cowing of patient's lab results and she will discuss with her mother. Patient is having some confusion and doesn't feel as if she can be alone at home. Debbie asked what she should do about this. I advised Debbie to have patient evaluated in the emergency room.

## 2016-07-06 DIAGNOSIS — R4182 Altered mental status, unspecified: Secondary | ICD-10-CM | POA: Diagnosis not present

## 2016-07-06 DIAGNOSIS — R410 Disorientation, unspecified: Secondary | ICD-10-CM | POA: Diagnosis not present

## 2016-07-06 DIAGNOSIS — R413 Other amnesia: Secondary | ICD-10-CM | POA: Diagnosis not present

## 2016-07-06 DIAGNOSIS — A419 Sepsis, unspecified organism: Secondary | ICD-10-CM | POA: Diagnosis not present

## 2016-07-09 ENCOUNTER — Telehealth: Payer: Self-pay | Admitting: Rheumatology

## 2016-07-09 NOTE — Telephone Encounter (Signed)
Noted. Since urine is normal and culture is negative, the confusion may be from another source best addressed by pcp and her other medical team.

## 2016-07-09 NOTE — Telephone Encounter (Signed)
Patient's daughter advised urine culture is negative. Patient was seen in emergency on Saturday for the confusion she was having and states that they did not find anything wrong with her and she has a follow up with her PCP.

## 2016-07-09 NOTE — Telephone Encounter (Signed)
Patient's daughter called wanting to get the results of her mother's urine culture.  CB#[phone].  Thank you.

## 2016-07-11 DIAGNOSIS — R410 Disorientation, unspecified: Secondary | ICD-10-CM | POA: Diagnosis not present

## 2016-07-11 DIAGNOSIS — R413 Other amnesia: Secondary | ICD-10-CM | POA: Diagnosis not present

## 2016-07-11 DIAGNOSIS — E039 Hypothyroidism, unspecified: Secondary | ICD-10-CM | POA: Diagnosis not present

## 2016-07-11 DIAGNOSIS — R2681 Unsteadiness on feet: Secondary | ICD-10-CM | POA: Diagnosis not present

## 2016-07-11 DIAGNOSIS — M353 Polymyalgia rheumatica: Secondary | ICD-10-CM | POA: Diagnosis not present

## 2016-07-11 DIAGNOSIS — R5383 Other fatigue: Secondary | ICD-10-CM | POA: Diagnosis not present

## 2016-07-16 DIAGNOSIS — R413 Other amnesia: Secondary | ICD-10-CM | POA: Diagnosis not present

## 2016-07-16 DIAGNOSIS — G319 Degenerative disease of nervous system, unspecified: Secondary | ICD-10-CM | POA: Diagnosis not present

## 2016-07-19 DIAGNOSIS — R413 Other amnesia: Secondary | ICD-10-CM | POA: Diagnosis not present

## 2016-07-19 DIAGNOSIS — Z7982 Long term (current) use of aspirin: Secondary | ICD-10-CM | POA: Diagnosis not present

## 2016-07-19 DIAGNOSIS — Z7902 Long term (current) use of antithrombotics/antiplatelets: Secondary | ICD-10-CM | POA: Diagnosis not present

## 2016-07-19 DIAGNOSIS — M353 Polymyalgia rheumatica: Secondary | ICD-10-CM | POA: Diagnosis not present

## 2016-07-19 DIAGNOSIS — Z87891 Personal history of nicotine dependence: Secondary | ICD-10-CM | POA: Diagnosis not present

## 2016-07-19 DIAGNOSIS — Z7952 Long term (current) use of systemic steroids: Secondary | ICD-10-CM | POA: Diagnosis not present

## 2016-07-19 DIAGNOSIS — Z85828 Personal history of other malignant neoplasm of skin: Secondary | ICD-10-CM | POA: Diagnosis not present

## 2016-07-19 DIAGNOSIS — R2681 Unsteadiness on feet: Secondary | ICD-10-CM | POA: Diagnosis not present

## 2016-07-19 DIAGNOSIS — R5383 Other fatigue: Secondary | ICD-10-CM | POA: Diagnosis not present

## 2016-07-19 DIAGNOSIS — Z9181 History of falling: Secondary | ICD-10-CM | POA: Diagnosis not present

## 2016-07-20 DIAGNOSIS — T161XXA Foreign body in right ear, initial encounter: Secondary | ICD-10-CM | POA: Diagnosis not present

## 2016-07-23 DIAGNOSIS — Z7902 Long term (current) use of antithrombotics/antiplatelets: Secondary | ICD-10-CM | POA: Diagnosis not present

## 2016-07-23 DIAGNOSIS — M353 Polymyalgia rheumatica: Secondary | ICD-10-CM | POA: Diagnosis not present

## 2016-07-23 DIAGNOSIS — R5383 Other fatigue: Secondary | ICD-10-CM | POA: Diagnosis not present

## 2016-07-23 DIAGNOSIS — R2681 Unsteadiness on feet: Secondary | ICD-10-CM | POA: Diagnosis not present

## 2016-07-23 DIAGNOSIS — Z9181 History of falling: Secondary | ICD-10-CM | POA: Diagnosis not present

## 2016-07-23 DIAGNOSIS — R413 Other amnesia: Secondary | ICD-10-CM | POA: Diagnosis not present

## 2016-07-24 ENCOUNTER — Telehealth: Payer: Self-pay | Admitting: Radiology

## 2016-07-24 DIAGNOSIS — I517 Cardiomegaly: Secondary | ICD-10-CM | POA: Diagnosis not present

## 2016-07-24 DIAGNOSIS — I639 Cerebral infarction, unspecified: Secondary | ICD-10-CM | POA: Diagnosis not present

## 2016-07-24 DIAGNOSIS — I63233 Cerebral infarction due to unspecified occlusion or stenosis of bilateral carotid arteries: Secondary | ICD-10-CM | POA: Diagnosis not present

## 2016-07-24 NOTE — Telephone Encounter (Signed)
I have called patient to advise. She is very confused regarding this. I have explained to her she is to use 1 mg prednisone, she is very confused about her meds  To you FYI

## 2016-07-24 NOTE — Telephone Encounter (Signed)
[facility], she should have plenty of 1mg  prednisone They want to make sure she is aware she is only now using 1mg  per day, I told them I will call her

## 2016-07-25 ENCOUNTER — Ambulatory Visit (INDEPENDENT_AMBULATORY_CARE_PROVIDER_SITE_OTHER): Payer: Medicare Other | Admitting: Neurology

## 2016-07-25 ENCOUNTER — Encounter: Payer: Self-pay | Admitting: Neurology

## 2016-07-25 ENCOUNTER — Telehealth: Payer: Self-pay

## 2016-07-25 VITALS — BP 131/63 | HR 69 | Ht 65.5 in | Wt 146.7 lb

## 2016-07-25 DIAGNOSIS — R413 Other amnesia: Secondary | ICD-10-CM | POA: Diagnosis not present

## 2016-07-25 DIAGNOSIS — I639 Cerebral infarction, unspecified: Secondary | ICD-10-CM | POA: Diagnosis not present

## 2016-07-25 NOTE — Telephone Encounter (Signed)
RN call Dr. Marylin Crosby with [facility] at [phone]. Rn stated Dr.Sethi wanted eye exam from office. Rn stated pt sign release from at [facility]. Fax number of [phone]. Release of information fax.

## 2016-07-25 NOTE — Telephone Encounter (Signed)
RN call patients daughter Sharee Pimple about providing eye doctor phone number to get eye exam report. Pts mom sign a release form at visit today. Rn will fax to get report.

## 2016-07-25 NOTE — Telephone Encounter (Signed)
Daughter Sharee Pimple called to give information for  eye Dr.  Joylene Igo number Dr. Marylin Crosby ([place]) phone number [phone]

## 2016-07-25 NOTE — Progress Notes (Signed)
[facility] [address] [place]. [place] [address] [phone]       OFFICE CONSULT NOTE  Ms. ARIELLE EBER Date of Birth:  04-Mar-1930 Medical Record Number:  [id number]   Referring MD:  Ernestene Kiel  Reason for Referral:  strokes  HPI: Ms Vankirk is a 83 year pleasant Caucasian lady seen today for initial consultation visit. She is accompanied by her daughter and niece. History is up 10 from the patient, family and review of medical records provided by primary physician. I have personally reviewed her MRI images. She states she noticed sudden onset of memory difficulties 3 weeks ago. She has to stop in mid sentence and search for words. She's also had trouble remembering recent information having to write things down and she's always had a great memory. She denied any headache, slurred speech, gait or balance difficulties. She has had diminished vision in the right eye following what she calls a stroke affecting her INR year ago. She was seen by ophthalmologist at that time but did not have the records. She does not remember having a stroke workup at that time. She saw her primary physician ordered an MRI scan of the brain which have personally reviewed done on 07/16/16 shows a tiny diffusion positive infarct in the left midbrain centrally 12 peduncle as well as a subacute infarct in the left lentiform nucleus which shows postcontrast enhancement as well. Is mild age-related generalized cerebral atrophy and changes of small vessel disease. Patient states that her memory loss has persisted. Primary care physician also ordered a carotid ultrasound which I have reviewed which does not show significant extracranial stenosis. No vascular imaging study of the brain was obtained. Transthoracic echo has been ordered but not yet been completed. The patient denies any history suggestive of atrial fibrillation, palpitations, fainting episodes of syncope. She has no known cardiac  problems. She was previously on aspirin and Plavix has been added. She is tolerating these with minor bruising but no bleeding. She denies known vascular risk factors in the form of hypertension, hyperlipidemia or diabetes and did not have any recent lab work to see if lipid profile and hemoglobin A1c have been checked recently.  ROS:   14 system review of systems is positive for hearing loss, blurred vision, loss of vision, runny nose, memory loss, confusion, disinterest in activities and all other systems negative  PMH:  Past Medical History:  Diagnosis Date  . Arthritis   . DDD (degenerative disc disease), lumbar 01/30/2016  . Glaucoma   . Hypothyroidism   . Multiple drug allergies    to multiple antibiotics.   . Osteoarthritis of both feet 01/30/2016  . Osteoarthritis of both hands 01/30/2016  . Osteoarthritis of both knees 01/30/2016  . Osteoarthritis of right hand 01/30/2016   moderate  . Polymyalgia rheumatica ([place])   . RA (rheumatoid arthritis) ([place]) 01/30/2016   Sero Negative  . Skin cancer of nose   . Stroke [facility])     Social History:  Social History   Social History  . Marital status: Widowed    Spouse name: N/A  . Number of children: N/A  . Years of education: N/A   Occupational History  . Not on file.   Social History Main Topics  . Smoking status: Former Smoker    Years: 10.00    Types: Cigarettes    Quit date: 01/31/1970  . Smokeless tobacco: Never Used  . Alcohol use No  . Drug use: No  . Sexual  activity: Not on file   Other Topics Concern  . Not on file   Social History Narrative  . No narrative on file    Medications:   Current Outpatient Prescriptions on File Prior to Visit  Medication Sig Dispense Refill  . BAYER CONTOUR TEST test strip daily. for testing  5  . calcium-vitamin D 250-100 MG-UNIT tablet Take 1 tablet by mouth 2 (two) times daily.    . cholecalciferol (VITAMIN D) 1000 units tablet Take 1,000 Units by mouth daily.    . folic  acid (FOLVITE) 1 MG tablet Take 1 tablet (1 mg total) by mouth daily. 90 tablet 4  . levothyroxine (SYNTHROID, LEVOTHROID) 112 MCG tablet Take 88 mcg by mouth daily before breakfast.   0  . methotrexate (RHEUMATREX) 2.5 MG tablet TAKE 3 TABLETS BY MOUTH EVERY WEEK 12 tablet 0  . Multiple Vitamin (MULTIVITAMIN) tablet Take 1 tablet by mouth daily.    . Omega-3 Fatty Acids (FISH OIL) 1000 MG CAPS Take 1,000 mg by mouth 2 (two) times daily.     . predniSONE (DELTASONE) 1 MG tablet Take 1 tablet (1 mg total) by mouth daily with breakfast. 90 tablet 1  . Tetrahydrozoline HCl (EYE DROPS OP) Apply to eye.     No current facility-administered medications on file prior to visit.     Allergies:   Allergies  Allergen Reactions  . Biaxin [Clarithromycin] Other (See Comments)    insomnia  . Clarithromycin Other (See Comments)    Tasted so bad could sleep from swallowing  . Augmentin [Amoxicillin-Pot Clavulanate] Rash  . Clindamycin Rash  . Clindamycin/Lincomycin Rash   Blood pressure 131/63, pulse 69, height 5' 5.5" (1.664 m), weight 146 lb 11.2 oz (66.5 kg).  Physical Exam General: well developed, well nourished, seated, in no evident distress Head: head normocephalic and atraumatic.   Neck: supple with no carotid or supraclavicular bruits Cardiovascular: regular rate and rhythm, no murmurs Musculoskeletal: no deformity Skin:  no rash/petichiae Vascular:  Normal pulses all extremities  Neurologic Exam Mental Status: Awake and fully alert. Oriented to place and time. Recent and remote memory intact.Diminished recall 0/3. Able to name only 7 animals having 4 legs. Attention span, concentration and fund of knowledge appropriate. Mood and affect appropriate.  Cranial Nerves: Fundoscopic exam reveals sharp disc margins. Pupils equal, briskly reactive to light. Extraocular movements full without nystagmus. Visual fields full to confrontation. Diminished visual acuity in the right eye. Hearing  diminished bilaterally. Facial sensation intact. Face, tongue, palate moves normally and symmetrically.  Motor: Normal bulk and tone. Normal strength in all tested extremity muscles. Sensory.: intact to touch , pinprick , position and vibratory sensation.  Coordination: Rapid alternating movements normal in all extremities. Finger-to-nose and heel-to-shin performed accurately bilaterally. Gait and Station: Arises from chair without difficulty. Stance is normal. Gait demonstrates normal stride length and balance . Not able to heel, toe and tandem walk without difficulty.  Reflexes: 1+ and symmetric. Toes downgoing.   NIHSS  1 Modified Rankin 2   ASSESSMENT: 83 year old lady with subacute memory loss possibly related to recent basal ganglia infarct as well as an acute clinically silent midbrain infarct as well. Remote history of right eye vision loss due to suspected retinal  artery occlusion. Absence of traditional risk factors apart from her age would likely favor cryptogenic etiology requiring further workup    PLAN: I had a long d/w patient, daughter and neice about her recent strokes, memory loss, risk for recurrent stroke/TIAs, personally independently  reviewed imaging studies and stroke evaluation results and answered questions.Continue aspirin and Plavix for 3 months followed by Plavix alone for secondary stroke prevention and maintain strict control of hypertension with blood pressure goal below 130/90, diabetes with hemoglobin A1c goal below 6.5% and lipids with LDL cholesterol goal below 70 mg/dL. I also advised the patient to eat a healthy diet with plenty of whole grains, cereals, fruits and vegetables, exercise regularly and maintain ideal body weight. Check CT angiogram of the brain and neck, lipid profile, HBA1c  followed by transesophageal echocardiogram and then loop recorder insertion for paroxysmal A. fib. We also discussed memory compensation strategies. Greater than 50% time  during this 45 minute consultation visit was spent on counseling and coordination of care about her strokes, discussion of evaluation and treatment plan and answering questions Followup in the future with me in 2 months or call earlier if necessary Antony Contras, MD  [facility] [address] [place], [place] [address]  Phone [phone] Fax [phone] Note: This document was prepared with digital dictation and possible smart phrase technology. Any transcriptional errors that result from this process are unintentional.

## 2016-07-25 NOTE — Patient Instructions (Signed)
I had a long d/w patient, daughter and neice about her recent strokes, memory loss, risk for recurrent stroke/TIAs, personally independently reviewed imaging studies and stroke evaluation results and answered questions.Continue aspirin and Plavix for 3 months followed by Plavix alone for secondary stroke prevention and maintain strict control of hypertension with blood pressure goal below 130/90, diabetes with hemoglobin A1c goal below 6.5% and lipids with LDL cholesterol goal below 70 mg/dL. I also advised the patient to eat a healthy diet with plenty of whole grains, cereals, fruits and vegetables, exercise regularly and maintain ideal body weight. Check CT angiogram of the brain and neck followed by transesophageal echocardiogram and then loop recorder insertion for paroxysmal A. fib. We also discussed memory compensation strategies. Followup in the future with me in 2 months or call earlier if necessary Stroke Prevention Some medical conditions and behaviors are associated with an increased chance of having a stroke. You may prevent a stroke by making healthy choices and managing medical conditions. How can I reduce my risk of having a stroke?  Stay physically active. Get at least 30 minutes of activity on most or all days.  Do not smoke. It may also be helpful to avoid exposure to secondhand smoke.  Limit alcohol use. Moderate alcohol use is considered to be:  No more than 2 drinks per day for men.  No more than 1 drink per day for nonpregnant women.  Eat healthy foods. This involves:  Eating 5 or more servings of fruits and vegetables a day.  Making dietary changes that address high blood pressure (hypertension), high cholesterol, diabetes, or obesity.  Manage your cholesterol levels.  Making food choices that are high in fiber and low in saturated fat, trans fat, and cholesterol may control cholesterol levels.  Take any prescribed medicines to control cholesterol as directed by your  health care provider.  Manage your diabetes.  Controlling your carbohydrate and sugar intake is recommended to manage diabetes.  Take any prescribed medicines to control diabetes as directed by your health care provider.  Control your hypertension.  Making food choices that are low in salt (sodium), saturated fat, trans fat, and cholesterol is recommended to manage hypertension.  Ask your health care provider if you need treatment to lower your blood pressure. Take any prescribed medicines to control hypertension as directed by your health care provider.  If you are 24-48 years of age, have your blood pressure checked every 3-5 years. If you are 63 years of age or older, have your blood pressure checked every year.  Maintain a healthy weight.  Reducing calorie intake and making food choices that are low in sodium, saturated fat, trans fat, and cholesterol are recommended to manage weight.  Stop drug abuse.  Avoid taking birth control pills.  Talk to your health care provider about the risks of taking birth control pills if you are over 44 years old, smoke, get migraines, or have ever had a blood clot.  Get evaluated for sleep disorders (sleep apnea).  Talk to your health care provider about getting a sleep evaluation if you snore a lot or have excessive sleepiness.  Take medicines only as directed by your health care provider.  For some people, aspirin or blood thinners (anticoagulants) are helpful in reducing the risk of forming abnormal blood clots that can lead to stroke. If you have the irregular heart rhythm of atrial fibrillation, you should be on a blood thinner unless there is a good reason you cannot take them.  Understand all your medicine instructions.  Make sure that other conditions (such as anemia or atherosclerosis) are addressed. Get help right away if:  You have sudden weakness or numbness of the face, arm, or leg, especially on one side of the body.  Your  face or eyelid droops to one side.  You have sudden confusion.  You have trouble speaking (aphasia) or understanding.  You have sudden trouble seeing in one or both eyes.  You have sudden trouble walking.  You have dizziness.  You have a loss of balance or coordination.  You have a sudden, severe headache with no known cause.  You have new chest pain or an irregular heartbeat. Any of these symptoms may represent a serious problem that is an emergency. Do not wait to see if the symptoms will go away. Get medical help at once. Call your local emergency services (911 in [place] Do not drive yourself to the hospital. This information is not intended to replace advice given to you by your health care provider. Make sure you discuss any questions you have with your health care provider. Document Released: 04/25/2004 Document Revised: 08/24/2015 Document Reviewed: 09/18/2012 Elsevier Interactive Patient Education  2017 [place] Compensation Strategies  1. Use "WARM" strategy.  W= write it down  A= associate it  R= repeat it  M= make a mental note  2.   You can keep a Social worker.  Use a 3-ring notebook with sections for the following: calendar, important names and phone numbers,  medications, doctors' names/phone numbers, lists/reminders, and a section to journal what you did  each day.   3.    Use a calendar to write appointments down.  4.    Write yourself a schedule for the day.  This can be placed on the calendar or in a separate section of the Memory Notebook.  Keeping a  regular schedule can help memory.  5.    Use medication organizer with sections for each day or morning/evening pills.  You may need help loading it  6.    Keep a basket, or pegboard by the door.  Place items that you need to take out with you in the basket or on the pegboard.  You may also want to  include a message board for reminders.  7.    Use sticky notes.  Place sticky notes with  reminders in a place where the task is performed.  For example: " turn off the  stove" placed by the stove, "lock the door" placed on the door at eye level, " take your medications" on  the bathroom mirror or by the place where you normally take your medications.  8.    Use alarms/timers.  Use while cooking to remind yourself to check on food or as a reminder to take your medicine, or as a  reminder to make a call, or as a reminder to perform another task, etc.

## 2016-07-26 NOTE — Telephone Encounter (Signed)
Rn call Dr. Gardiner Ramus that the eye report for patient was not receive. The staff stated they fax the eye report to [phone]. Rn stated the fax should be sent to [phone]. They stated it will be fax again today.

## 2016-07-30 ENCOUNTER — Ambulatory Visit
Admission: RE | Admit: 2016-07-30 | Discharge: 2016-07-30 | Disposition: A | Discharge status: Home / Self Care | Payer: Medicare Other | Source: Ambulatory Visit | Attending: Neurology | Admitting: Neurology

## 2016-07-30 DIAGNOSIS — I639 Cerebral infarction, unspecified: Secondary | ICD-10-CM

## 2016-07-30 DIAGNOSIS — R413 Other amnesia: Secondary | ICD-10-CM

## 2016-07-30 MED ORDER — IOPAMIDOL (ISOVUE-370) INJECTION 76%
100.0000 mL | Freq: Once | INTRAVENOUS | Status: AC | PRN
Start: 2016-07-30 — End: 2016-07-30
  Administered 2016-07-30: 100 mL via INTRAVENOUS

## 2016-08-01 ENCOUNTER — Other Ambulatory Visit: Payer: Self-pay | Admitting: Rheumatology

## 2016-08-01 DIAGNOSIS — Z9181 History of falling: Secondary | ICD-10-CM | POA: Diagnosis not present

## 2016-08-01 DIAGNOSIS — M353 Polymyalgia rheumatica: Secondary | ICD-10-CM | POA: Diagnosis not present

## 2016-08-01 DIAGNOSIS — R2681 Unsteadiness on feet: Secondary | ICD-10-CM | POA: Diagnosis not present

## 2016-08-01 DIAGNOSIS — Z7902 Long term (current) use of antithrombotics/antiplatelets: Secondary | ICD-10-CM | POA: Diagnosis not present

## 2016-08-01 DIAGNOSIS — R413 Other amnesia: Secondary | ICD-10-CM | POA: Diagnosis not present

## 2016-08-01 DIAGNOSIS — R5383 Other fatigue: Secondary | ICD-10-CM | POA: Diagnosis not present

## 2016-08-01 NOTE — Telephone Encounter (Signed)
Last Visit: 07/04/16 Next Visit: 10/08/16 Labs: 07/04/16 CMP with GFR is within normal limits except for mild elevation of creatinine 1.02 and mild decrease in GFR at 50.  Okay to refill MTX?

## 2016-08-05 ENCOUNTER — Encounter: Payer: Self-pay | Admitting: *Deleted

## 2016-08-06 DIAGNOSIS — R413 Other amnesia: Secondary | ICD-10-CM | POA: Diagnosis not present

## 2016-08-06 DIAGNOSIS — R5383 Other fatigue: Secondary | ICD-10-CM | POA: Diagnosis not present

## 2016-08-06 DIAGNOSIS — M353 Polymyalgia rheumatica: Secondary | ICD-10-CM | POA: Diagnosis not present

## 2016-08-06 DIAGNOSIS — R2681 Unsteadiness on feet: Secondary | ICD-10-CM | POA: Diagnosis not present

## 2016-08-06 DIAGNOSIS — Z9181 History of falling: Secondary | ICD-10-CM | POA: Diagnosis not present

## 2016-08-06 DIAGNOSIS — Z7902 Long term (current) use of antithrombotics/antiplatelets: Secondary | ICD-10-CM | POA: Diagnosis not present

## 2016-08-08 DIAGNOSIS — R5383 Other fatigue: Secondary | ICD-10-CM | POA: Diagnosis not present

## 2016-08-08 DIAGNOSIS — M353 Polymyalgia rheumatica: Secondary | ICD-10-CM | POA: Diagnosis not present

## 2016-08-08 DIAGNOSIS — R2681 Unsteadiness on feet: Secondary | ICD-10-CM | POA: Diagnosis not present

## 2016-08-08 DIAGNOSIS — Z7902 Long term (current) use of antithrombotics/antiplatelets: Secondary | ICD-10-CM | POA: Diagnosis not present

## 2016-08-08 DIAGNOSIS — R413 Other amnesia: Secondary | ICD-10-CM | POA: Diagnosis not present

## 2016-08-08 DIAGNOSIS — Z9181 History of falling: Secondary | ICD-10-CM | POA: Diagnosis not present

## 2016-08-13 ENCOUNTER — Telehealth: Payer: Self-pay

## 2016-08-13 DIAGNOSIS — R413 Other amnesia: Secondary | ICD-10-CM | POA: Diagnosis not present

## 2016-08-13 DIAGNOSIS — M353 Polymyalgia rheumatica: Secondary | ICD-10-CM | POA: Diagnosis not present

## 2016-08-13 DIAGNOSIS — Z9181 History of falling: Secondary | ICD-10-CM | POA: Diagnosis not present

## 2016-08-13 DIAGNOSIS — Z7902 Long term (current) use of antithrombotics/antiplatelets: Secondary | ICD-10-CM | POA: Diagnosis not present

## 2016-08-13 DIAGNOSIS — R5383 Other fatigue: Secondary | ICD-10-CM | POA: Diagnosis not present

## 2016-08-13 DIAGNOSIS — R2681 Unsteadiness on feet: Secondary | ICD-10-CM | POA: Diagnosis not present

## 2016-08-13 NOTE — Telephone Encounter (Signed)
-----   Message from Garvin Fila, MD sent at 08/12/2016  6:40 PM EDT ----- Mitchell Heir inform the patient adds CT angiogram of the brain showed only minor hardening of the arteries in the neck and in the brain on the right side in the back with no major or worrisome findings

## 2016-08-13 NOTE — Telephone Encounter (Signed)
RN call Sharee Pimple pts daughter on dpr. Rn stated the ct angiogram showed only minor hardening of the arteries in the neck and in the brain, on right side in the back, no major or worrisome findings. PTs daughter verbalized understanding.

## 2016-08-15 DIAGNOSIS — I083 Combined rheumatic disorders of mitral, aortic and tricuspid valves: Secondary | ICD-10-CM | POA: Diagnosis not present

## 2016-08-15 DIAGNOSIS — I517 Cardiomegaly: Secondary | ICD-10-CM | POA: Diagnosis not present

## 2016-08-16 DIAGNOSIS — M353 Polymyalgia rheumatica: Secondary | ICD-10-CM | POA: Diagnosis not present

## 2016-08-16 DIAGNOSIS — R2681 Unsteadiness on feet: Secondary | ICD-10-CM | POA: Diagnosis not present

## 2016-08-16 DIAGNOSIS — Z7902 Long term (current) use of antithrombotics/antiplatelets: Secondary | ICD-10-CM | POA: Diagnosis not present

## 2016-08-16 DIAGNOSIS — R413 Other amnesia: Secondary | ICD-10-CM | POA: Diagnosis not present

## 2016-08-16 DIAGNOSIS — Z9181 History of falling: Secondary | ICD-10-CM | POA: Diagnosis not present

## 2016-08-16 DIAGNOSIS — R5383 Other fatigue: Secondary | ICD-10-CM | POA: Diagnosis not present

## 2016-08-21 ENCOUNTER — Encounter: Payer: Self-pay | Admitting: Internal Medicine

## 2016-08-21 ENCOUNTER — Ambulatory Visit (INDEPENDENT_AMBULATORY_CARE_PROVIDER_SITE_OTHER): Payer: Medicare Other | Admitting: Internal Medicine

## 2016-08-21 VITALS — BP 130/50 | HR 70 | Ht 64.5 in | Wt 143.4 lb

## 2016-08-21 DIAGNOSIS — I639 Cerebral infarction, unspecified: Secondary | ICD-10-CM

## 2016-08-21 DIAGNOSIS — I4891 Unspecified atrial fibrillation: Secondary | ICD-10-CM

## 2016-08-21 NOTE — Progress Notes (Addendum)
Electrophysiology Office Note  Date:  08/21/2016   ID:  Beverly Estrada, DOB 05/07/1929, MRN [id number]  PCP:  Ernestene Kiel, MD   Chief Complaint  Patient presents with  . New Patient (Initial Visit)    Cryptogenic stroke     History of Present Illness: Beverly Estrada is a 83 y.o. female who presents today for electrophysiology evaluation.   She is referred by Dr Leonie Man for further cardiology assessment s/p prior stroke.  She has recently been evaluated by neurology and felt to have a recent basal ganglia infarct as well as an acute clinically silent midbrain infarct.  There is also a remote history of retinal artery occlusion.  Given concerns for cardiac source, she has been referred to cardiology. She remains active. Today, she denies symptoms of palpitations, chest pain, shortness of breath, orthopnea, PND, lower extremity edema, claudication, dizziness, presyncope, syncope, bleeding, or neurologic sequela. The patient is tolerating medications without difficulties and is otherwise without complaint today.    Past Medical History:  Diagnosis Date  . Arthritis   . DDD (degenerative disc disease), lumbar 01/30/2016  . Glaucoma   . Hypothyroidism   . Multiple drug allergies    to multiple antibiotics.   . Osteoarthritis of both feet 01/30/2016  . Osteoarthritis of both hands 01/30/2016  . Osteoarthritis of both knees 01/30/2016  . Osteoarthritis of right hand 01/30/2016   moderate  . Polymyalgia rheumatica ([place])   . RA (rheumatoid arthritis) ([place]) 01/30/2016   Sero Negative  . Skin cancer of nose   . Stroke [facility])    Past Surgical History:  Procedure Laterality Date  . APPENDECTOMY    . CHOLECYSTECTOMY    . COLONOSCOPY  ~2013   pooyps removed, one of them large per pt. Polyp pathology unclear. no rescreening study planned due to age  . TONSIL SURGERY       Current Outpatient Prescriptions  Medication Sig Dispense Refill  . atorvastatin (LIPITOR) 40 MG  tablet Take 40 mg by mouth daily.  5  . BAYER CONTOUR TEST test strip daily. for testing  5  . calcium-vitamin D 250-100 MG-UNIT tablet Take 1 tablet by mouth 2 (two) times daily.    . cholecalciferol (VITAMIN D) 1000 units tablet Take 1,000 Units by mouth daily.    . clopidogrel (PLAVIX) 75 MG tablet Take 75 mg by mouth daily.  5  . [employer] ASPIRIN EC 325 MG EC tablet Take 325 mg by mouth daily.  0  . folic acid (FOLVITE) 1 MG tablet Take 1 tablet (1 mg total) by mouth daily. 90 tablet 4  . levothyroxine (SYNTHROID, LEVOTHROID) 88 MCG tablet TAKE 1 TABLET BY MOUTH ON AN EMPTY STOMACH IN THE MORNING  2  . methotrexate (RHEUMATREX) 2.5 MG tablet TAKE 3 TABLETS BY MOUTH EVERY WEEK 12 tablet 0  . Multiple Vitamin (MULTIVITAMIN) tablet Take 1 tablet by mouth daily.    . Omega-3 Fatty Acids (FISH OIL) 1000 MG CAPS Take 1,000 mg by mouth 2 (two) times daily.     . predniSONE (DELTASONE) 1 MG tablet Take 1 tablet (1 mg total) by mouth daily with breakfast. 90 tablet 1  . Tetrahydrozoline HCl (EYE DROPS OP) Apply to eye as directed.      No current facility-administered medications for this visit.     Allergies:   Biaxin [clarithromycin]; Clarithromycin; Augmentin [amoxicillin-pot clavulanate]; Clindamycin; and Clindamycin/lincomycin   Social History:  The patient  reports that she quit smoking about 46 years ago.  Her smoking use included Cigarettes. She quit after 10.00 years of use. She has never used smokeless tobacco. She reports that she does not drink alcohol or use drugs.   Family History:  The patient's family history includes Diabetes in her sister; Diabetes Mellitus II in her brother and mother; Stroke in her brother and mother.    ROS:  Please see the history of present illness.   All other systems are personally reviewed and negative.    PHYSICAL EXAM: VS:  BP (!) 130/50   Pulse 70   Ht 5' 4.5" (1.638 m)   Wt 143 lb 6.4 oz (65 kg)   SpO2 96%   BMI 24.23 kg/m  , BMI Body mass index  is 24.23 kg/m. GEN: Well nourished, well developed, in no acute distress  HEENT: normal  Neck: no JVD, carotid bruits, or masses Cardiac: RRR; no murmurs, rubs, or gallops,no edema  Respiratory:  clear to auscultation bilaterally, normal work of breathing GI: soft, nontender, nondistended, + BS MS: no deformity or atrophy  Skin: warm and dry  Neuro:  Strength and sensation are intact Psych: euthymic mood, full affect  EKG:  EKG is ordered today. The ekg ordered today is personally reviewed and shows sinus rhythm 70 bpm, nonspecific St/T changes   Recent Labs: 07/04/2016: ALT 8; BUN 15; Creat 1.02; Hemoglobin 13.1; Platelets 288; Potassium 4.7; Sodium 139  personally reviewed   Lipid Panel  No results found for: CHOL, TRIG, HDL, CHOLHDL, VLDL, LDLCALC, LDLDIRECT personally reviewed   Wt Readings from Last 3 Encounters:  08/21/16 143 lb 6.4 oz (65 kg)  07/25/16 146 lb 11.2 oz (66.5 kg)  07/04/16 148 lb (67.1 kg)      Other studies personally reviewed: Additional studies/ records that were reviewed today include: Dr Clydene Fake notes  Review of the above records today demonstrates: as above   ASSESSMENT AND PLAN:  1.  Cryptogenic stroke The patient has had several embolic events previously.  There is concern for cardiac source.  She states that she has previously worn a monitor "over the weekend" which did not reveal any arrhythmias.  She reports recent echo in [place] (results currently not available). I have discussed cardiac evaluation including TEE and ILR as options according to our cryptogenic stroke protocol.  Risks and benefits of both TEE and ILR were discussed at length with patient and daughter.  They understand risks and wish to proceed.  We will schedule the procedures at the next available time.   Current medicines are reviewed at length with the patient today.   The patient does not have concerns regarding her medicines.  The following changes were made today:   none  Signed, Thompson Grayer, MD  08/21/2016 11:43 AM     [facility] [address] [place] [address] [phone] (office) [phone] (fax)   Addendum: Echo received from [facility] from 08/15/16 EF 60-65%, mild AI, mild MR, mild TR, normal LA size Scanned into media in [place] MD, [facility] 08/21/2016 5:44 PM

## 2016-08-21 NOTE — Patient Instructions (Addendum)
   Medication Instructions:  Your physician recommends that you continue on your current medications as directed. Please refer to the Current Medication list given to you today.   Labwork: None ordered   Testing/Procedures: Your physician has requested that you have a TEE. During a TEE, sound waves are used to create images of your heart. It provides your doctor with information about the size and shape of your heart and how well your heart's chambers and valves are working. In this test, a transducer is attached to the end of a flexible tube that's guided down your throat and into your esophagus (the tube leading from you mouth to your stomach) to get a more detailed image of your heart. You are not awake for the procedure. Please see the instruction sheet given to you today. For further information please [email or web address]  08/23/16   Please arrive at [facility] at 7:30am Do not eat or drink after midnight the night prior to the procedure Do not take any medications the morning of the test Will need someone to drive you home at discharge    LINQ implant for Fri 08/23/16 after the TEE  Follow-Up:  Your physician recommends that you schedule a follow-up appointment in: 10-14 days in device clinic for wound check   Any Other Special Instructions Will Be Listed Below (If Applicable).     If you need a refill on your cardiac medications before your next appointment, please call your pharmacy.

## 2016-08-22 DIAGNOSIS — E119 Type 2 diabetes mellitus without complications: Secondary | ICD-10-CM | POA: Diagnosis not present

## 2016-08-22 DIAGNOSIS — E039 Hypothyroidism, unspecified: Secondary | ICD-10-CM | POA: Diagnosis not present

## 2016-08-22 DIAGNOSIS — M353 Polymyalgia rheumatica: Secondary | ICD-10-CM | POA: Diagnosis not present

## 2016-08-22 DIAGNOSIS — I639 Cerebral infarction, unspecified: Secondary | ICD-10-CM | POA: Diagnosis not present

## 2016-08-23 ENCOUNTER — Ambulatory Visit (HOSPITAL_COMMUNITY)
Admission: RE | Admit: 2016-08-23 | Discharge: 2016-08-23 | Disposition: A | Discharge status: Home / Self Care | Payer: Medicare Other | Source: Ambulatory Visit | Attending: Cardiology | Admitting: Cardiology

## 2016-08-23 ENCOUNTER — Encounter (HOSPITAL_COMMUNITY): Payer: Self-pay | Admitting: Internal Medicine

## 2016-08-23 ENCOUNTER — Ambulatory Visit (HOSPITAL_COMMUNITY): Payer: Medicare Other

## 2016-08-23 ENCOUNTER — Encounter (HOSPITAL_COMMUNITY)
Admission: RE | Disposition: A | Discharge status: Home / Self Care | Payer: Self-pay | Source: Ambulatory Visit | Attending: Cardiology

## 2016-08-23 DIAGNOSIS — H409 Unspecified glaucoma: Secondary | ICD-10-CM | POA: Insufficient documentation

## 2016-08-23 DIAGNOSIS — Z87891 Personal history of nicotine dependence: Secondary | ICD-10-CM | POA: Diagnosis not present

## 2016-08-23 DIAGNOSIS — M353 Polymyalgia rheumatica: Secondary | ICD-10-CM | POA: Diagnosis not present

## 2016-08-23 DIAGNOSIS — Z880 Allergy status to penicillin: Secondary | ICD-10-CM | POA: Insufficient documentation

## 2016-08-23 DIAGNOSIS — I639 Cerebral infarction, unspecified: Secondary | ICD-10-CM | POA: Diagnosis not present

## 2016-08-23 DIAGNOSIS — E039 Hypothyroidism, unspecified: Secondary | ICD-10-CM | POA: Insufficient documentation

## 2016-08-23 DIAGNOSIS — Z7902 Long term (current) use of antithrombotics/antiplatelets: Secondary | ICD-10-CM | POA: Diagnosis not present

## 2016-08-23 DIAGNOSIS — I4891 Unspecified atrial fibrillation: Secondary | ICD-10-CM | POA: Diagnosis not present

## 2016-08-23 DIAGNOSIS — M19041 Primary osteoarthritis, right hand: Secondary | ICD-10-CM | POA: Diagnosis not present

## 2016-08-23 DIAGNOSIS — M19072 Primary osteoarthritis, left ankle and foot: Secondary | ICD-10-CM | POA: Diagnosis not present

## 2016-08-23 DIAGNOSIS — M19042 Primary osteoarthritis, left hand: Secondary | ICD-10-CM | POA: Insufficient documentation

## 2016-08-23 DIAGNOSIS — M170 Bilateral primary osteoarthritis of knee: Secondary | ICD-10-CM | POA: Insufficient documentation

## 2016-08-23 DIAGNOSIS — M069 Rheumatoid arthritis, unspecified: Secondary | ICD-10-CM | POA: Diagnosis not present

## 2016-08-23 DIAGNOSIS — Z7952 Long term (current) use of systemic steroids: Secondary | ICD-10-CM | POA: Diagnosis not present

## 2016-08-23 DIAGNOSIS — M5136 Other intervertebral disc degeneration, lumbar region: Secondary | ICD-10-CM | POA: Diagnosis not present

## 2016-08-23 DIAGNOSIS — Z7982 Long term (current) use of aspirin: Secondary | ICD-10-CM | POA: Diagnosis not present

## 2016-08-23 DIAGNOSIS — M19071 Primary osteoarthritis, right ankle and foot: Secondary | ICD-10-CM | POA: Insufficient documentation

## 2016-08-23 HISTORY — PX: TEE WITHOUT CARDIOVERSION: SHX5443

## 2016-08-23 HISTORY — PX: LOOP RECORDER INSERTION: EP1214

## 2016-08-23 SURGERY — ECHOCARDIOGRAM, TRANSESOPHAGEAL
Anesthesia: Moderate Sedation

## 2016-08-23 SURGERY — LOOP RECORDER INSERTION

## 2016-08-23 MED ORDER — LIDOCAINE-EPINEPHRINE 1 %-1:100000 IJ SOLN
INTRAMUSCULAR | Status: AC
  Filled 2016-08-23: qty 1

## 2016-08-23 MED ORDER — BUTAMBEN-TETRACAINE-BENZOCAINE 2-2-14 % EX AERO
INHALATION_SPRAY | CUTANEOUS | Status: DC | PRN
  Administered 2016-08-23: 2 via TOPICAL

## 2016-08-23 MED ORDER — LIDOCAINE-EPINEPHRINE 1 %-1:100000 IJ SOLN
INTRAMUSCULAR | Status: DC | PRN
  Administered 2016-08-23: 20 mL

## 2016-08-23 MED ORDER — SODIUM CHLORIDE 0.9 % IV SOLN
INTRAVENOUS | Status: DC
  Administered 2016-08-23: 09:00:00 via INTRAVENOUS

## 2016-08-23 MED ORDER — MIDAZOLAM HCL 5 MG/ML IJ SOLN
INTRAMUSCULAR | Status: AC
  Filled 2016-08-23: qty 2

## 2016-08-23 MED ORDER — FENTANYL CITRATE (PF) 100 MCG/2ML IJ SOLN
INTRAMUSCULAR | Status: DC | PRN
  Administered 2016-08-23: 25 ug via INTRAVENOUS

## 2016-08-23 MED ORDER — MIDAZOLAM HCL 10 MG/2ML IJ SOLN
INTRAMUSCULAR | Status: DC | PRN
Start: 2016-08-23 — End: 2016-08-23
  Administered 2016-08-23: 1 mg via INTRAVENOUS

## 2016-08-23 MED ORDER — FENTANYL CITRATE (PF) 100 MCG/2ML IJ SOLN
INTRAMUSCULAR | Status: AC
  Filled 2016-08-23: qty 2

## 2016-08-23 SURGICAL SUPPLY — 2 items
LOOP REVEAL LINQSYS (Prosthesis & Implant Heart) ×2 IMPLANT
PACK LOOP INSERTION (CUSTOM PROCEDURE TRAY) ×2 IMPLANT

## 2016-08-23 NOTE — Interval H&P Note (Signed)
History and Physical Interval Note:  08/23/2016 10:31 AM  Beverly Estrada  has presented today for surgery, with the diagnosis of afib  The various methods of treatment have been discussed with the patient and family. After consideration of risks, benefits and other options for treatment, the patient has consented to  Procedure(s): Loop Recorder Insertion (N/A) as a surgical intervention .  The patient's history has been reviewed, patient examined, no change in status, stable for surgery.  I have reviewed the patient's chart and labs.  Questions were answered to the patient's satisfaction.     Thompson Grayer

## 2016-08-23 NOTE — Progress Notes (Signed)
  Echocardiogram Echocardiogram Transesophageal has been performed.  Beverly Estrada 08/23/2016, 9:45 AM

## 2016-08-23 NOTE — Discharge Instructions (Signed)

## 2016-08-23 NOTE — CV Procedure (Signed)
Procedure: TEE  Indication: CVA   Sedation: Versed 1 mg IV, Fentanyl 25 mcg IV  Findings: Please see echo section for full report.  Normal LV size and systolic function, EF 61-44%.  Normal wall thickness and wall motion.  Normal right ventricular size and systolic function.  Moderate left atrial enlargement.  No LA appendage thrombus.  Normal RA size.  No significant TR.  Trivial PI.  Mild to moderate MR.  Trileaflet aortic valve with no stenosis, mild regurgitation.   No PFO or ASD noted (negative bubble study).  The thoracic aorta was normal caliber.  There was grade IV plaque that appeared to be in the upper descending thoracic aorta.   No definite source of embolus.   Beverly Estrada 08/23/2016 9:40 AM

## 2016-08-23 NOTE — Interval H&P Note (Signed)
History and Physical Interval Note:  08/23/2016 9:24 AM  Beverly Estrada  has presented today for surgery, with the diagnosis of afib  The various methods of treatment have been discussed with the patient and family. After consideration of risks, benefits and other options for treatment, the patient has consented to  Procedure(s): TRANSESOPHAGEAL ECHOCARDIOGRAM (TEE) (N/A) as a surgical intervention .  The patient's history has been reviewed, patient examined, no change in status, stable for surgery.  I have reviewed the patient's chart and labs.  Questions were answered to the patient's satisfaction.     Beverly Estrada [employer]

## 2016-08-23 NOTE — H&P (View-Only) (Signed)
Electrophysiology Office Note  Date:  08/21/2016   ID:  TASHEENA WAMBOLT, DOB 06-02-1929, MRN [id number]  PCP:  Ernestene Kiel, MD   Chief Complaint  Patient presents with  . New Patient (Initial Visit)    Cryptogenic stroke     History of Present Illness: Beverly Estrada is a 83 y.o. female who presents today for electrophysiology evaluation.   She is referred by Dr Leonie Man for further cardiology assessment s/p prior stroke.  She has recently been evaluated by neurology and felt to have a recent basal ganglia infarct as well as an acute clinically silent midbrain infarct.  There is also a remote history of retinal artery occlusion.  Given concerns for cardiac source, she has been referred to cardiology. She remains active. Today, she denies symptoms of palpitations, chest pain, shortness of breath, orthopnea, PND, lower extremity edema, claudication, dizziness, presyncope, syncope, bleeding, or neurologic sequela. The patient is tolerating medications without difficulties and is otherwise without complaint today.    Past Medical History:  Diagnosis Date  . Arthritis   . DDD (degenerative disc disease), lumbar 01/30/2016  . Glaucoma   . Hypothyroidism   . Multiple drug allergies    to multiple antibiotics.   . Osteoarthritis of both feet 01/30/2016  . Osteoarthritis of both hands 01/30/2016  . Osteoarthritis of both knees 01/30/2016  . Osteoarthritis of right hand 01/30/2016   moderate  . Polymyalgia rheumatica ([place])   . RA (rheumatoid arthritis) ([place]) 01/30/2016   Sero Negative  . Skin cancer of nose   . Stroke [facility])    Past Surgical History:  Procedure Laterality Date  . APPENDECTOMY    . CHOLECYSTECTOMY    . COLONOSCOPY  ~2013   pooyps removed, one of them large per pt. Polyp pathology unclear. no rescreening study planned due to age  . TONSIL SURGERY       Current Outpatient Prescriptions  Medication Sig Dispense Refill  . atorvastatin (LIPITOR) 40 MG  tablet Take 40 mg by mouth daily.  5  . BAYER CONTOUR TEST test strip daily. for testing  5  . calcium-vitamin D 250-100 MG-UNIT tablet Take 1 tablet by mouth 2 (two) times daily.    . cholecalciferol (VITAMIN D) 1000 units tablet Take 1,000 Units by mouth daily.    . clopidogrel (PLAVIX) 75 MG tablet Take 75 mg by mouth daily.  5  . CVS ASPIRIN EC 325 MG EC tablet Take 325 mg by mouth daily.  0  . folic acid (FOLVITE) 1 MG tablet Take 1 tablet (1 mg total) by mouth daily. 90 tablet 4  . levothyroxine (SYNTHROID, LEVOTHROID) 88 MCG tablet TAKE 1 TABLET BY MOUTH ON AN EMPTY STOMACH IN THE MORNING  2  . methotrexate (RHEUMATREX) 2.5 MG tablet TAKE 3 TABLETS BY MOUTH EVERY WEEK 12 tablet 0  . Multiple Vitamin (MULTIVITAMIN) tablet Take 1 tablet by mouth daily.    . Omega-3 Fatty Acids (FISH OIL) 1000 MG CAPS Take 1,000 mg by mouth 2 (two) times daily.     . predniSONE (DELTASONE) 1 MG tablet Take 1 tablet (1 mg total) by mouth daily with breakfast. 90 tablet 1  . Tetrahydrozoline HCl (EYE DROPS OP) Apply to eye as directed.      No current facility-administered medications for this visit.     Allergies:   Biaxin [clarithromycin]; Clarithromycin; Augmentin [amoxicillin-pot clavulanate]; Clindamycin; and Clindamycin/lincomycin   Social History:  The patient  reports that she quit smoking about 46 years ago.  Her smoking use included Cigarettes. She quit after 10.00 years of use. She has never used smokeless tobacco. She reports that she does not drink alcohol or use drugs.   Family History:  The patient's family history includes Diabetes in her sister; Diabetes Mellitus II in her brother and mother; Stroke in her brother and mother.    ROS:  Please see the history of present illness.   All other systems are personally reviewed and negative.    PHYSICAL EXAM: VS:  BP (!) 130/50   Pulse 70   Ht 5' 4.5" (1.638 m)   Wt 143 lb 6.4 oz (65 kg)   SpO2 96%   BMI 24.23 kg/m  , BMI Body mass index  is 24.23 kg/m. GEN: Well nourished, well developed, in no acute distress  HEENT: normal  Neck: no JVD, carotid bruits, or masses Cardiac: RRR; no murmurs, rubs, or gallops,no edema  Respiratory:  clear to auscultation bilaterally, normal work of breathing GI: soft, nontender, nondistended, + BS MS: no deformity or atrophy  Skin: warm and dry  Neuro:  Strength and sensation are intact Psych: euthymic mood, full affect  EKG:  EKG is ordered today. The ekg ordered today is personally reviewed and shows sinus rhythm 70 bpm, nonspecific St/T changes   Recent Labs: 07/04/2016: ALT 8; BUN 15; Creat 1.02; Hemoglobin 13.1; Platelets 288; Potassium 4.7; Sodium 139  personally reviewed   Lipid Panel  No results found for: CHOL, TRIG, HDL, CHOLHDL, VLDL, LDLCALC, LDLDIRECT personally reviewed   Wt Readings from Last 3 Encounters:  08/21/16 143 lb 6.4 oz (65 kg)  07/25/16 146 lb 11.2 oz (66.5 kg)  07/04/16 148 lb (67.1 kg)      Other studies personally reviewed: Additional studies/ records that were reviewed today include: Dr Clydene Fake notes  Review of the above records today demonstrates: as above   ASSESSMENT AND PLAN:  1.  Cryptogenic stroke The patient has had several embolic events previously.  There is concern for cardiac source.  She states that she has previously worn a monitor "over the weekend" which did not reveal any arrhythmias.  She reports recent echo in [place] (results currently not available). I have discussed cardiac evaluation including TEE and ILR as options according to our cryptogenic stroke protocol.  Risks and benefits of both TEE and ILR were discussed at length with patient and daughter.  They understand risks and wish to proceed.  We will schedule the procedures at the next available time.   Current medicines are reviewed at length with the patient today.   The patient does not have concerns regarding her medicines.  The following changes were made today:   none  Signed, Thompson Grayer, MD  08/21/2016 11:43 AM     [facility] [address] [place] [address] [phone] (office) [phone] (fax)   Addendum: Echo received from [facility] from 08/15/16 EF 60-65%, mild AI, mild MR, mild TR, normal LA size Scanned into media in [place] MD, [facility] 08/21/2016 5:44 PM

## 2016-08-24 ENCOUNTER — Encounter (HOSPITAL_COMMUNITY): Payer: Self-pay | Admitting: Cardiology

## 2016-08-28 ENCOUNTER — Telehealth: Payer: Self-pay | Admitting: Internal Medicine

## 2016-08-28 NOTE — Telephone Encounter (Signed)
New Message   pt daughter Jackelyn Poling verbalized that she is calling for rn   She wants a print out regularly of her mothers device checks

## 2016-08-29 DIAGNOSIS — H34811 Central retinal vein occlusion, right eye, with macular edema: Secondary | ICD-10-CM | POA: Diagnosis not present

## 2016-08-29 NOTE — Telephone Encounter (Signed)
LVM with Beverly Estrada stating that she would need to go through medical records and fill out a release of information to receive copies of her mothers device checks. Direct number to device clinic given for any questions.

## 2016-09-05 ENCOUNTER — Ambulatory Visit (INDEPENDENT_AMBULATORY_CARE_PROVIDER_SITE_OTHER): Payer: Self-pay | Admitting: *Deleted

## 2016-09-05 DIAGNOSIS — I639 Cerebral infarction, unspecified: Secondary | ICD-10-CM

## 2016-09-05 LAB — CUP PACEART INCLINIC DEVICE CHECK
Date Time Interrogation Session: 20180607172503
Implantable Pulse Generator Implant Date: 20180525

## 2016-09-05 NOTE — Patient Instructions (Addendum)
MyChart activated 09/05/16  MyChart ID [facility] Temporary password [id number]  Call [phone] if need assistance with MyChart

## 2016-09-05 NOTE — Progress Notes (Signed)
Wound check appointment. Steri-strips removed. Wound without redness or edema. Incision edges approximated, wound well healed. Battery status: good. R-waves 0.30mV. 0 symptom episodes, 0 tachy episodes, 0 pause episodes, 0 brady episodes. 0 AF episodes (0% burden). Patient educated about wound care, including signs and symptoms of infection. Monthly summary reports and ROV with JA PRN.

## 2016-09-23 ENCOUNTER — Ambulatory Visit (INDEPENDENT_AMBULATORY_CARE_PROVIDER_SITE_OTHER): Payer: Medicare Other | Admitting: *Deleted

## 2016-09-23 DIAGNOSIS — I639 Cerebral infarction, unspecified: Secondary | ICD-10-CM

## 2016-09-23 NOTE — Progress Notes (Signed)
Carelink Summary Report / Loop Recorder 

## 2016-09-26 DIAGNOSIS — H348112 Central retinal vein occlusion, right eye, stable: Secondary | ICD-10-CM | POA: Diagnosis not present

## 2016-09-29 LAB — CUP PACEART REMOTE DEVICE CHECK
Date Time Interrogation Session: 20180624174432
Implantable Pulse Generator Implant Date: 20180525

## 2016-10-03 ENCOUNTER — Encounter: Payer: Self-pay | Admitting: Neurology

## 2016-10-03 ENCOUNTER — Ambulatory Visit (INDEPENDENT_AMBULATORY_CARE_PROVIDER_SITE_OTHER): Payer: Medicare Other | Admitting: Neurology

## 2016-10-03 VITALS — BP 136/66 | HR 61 | Wt 147.6 lb

## 2016-10-03 DIAGNOSIS — I639 Cerebral infarction, unspecified: Secondary | ICD-10-CM

## 2016-10-03 NOTE — Patient Instructions (Signed)
I had a long d/w patient about her recent cryptogenic stroke, risk for recurrent stroke/TIAs, personally independently reviewed imaging studies and stroke evaluation results and answered questions.Continue Plavix 75 mg daily   for secondary stroke prevention but discontinue aspirin and maintain strict control of hypertension with blood pressure goal below 130/90, diabetes with hemoglobin A1c goal below 6.5% and lipids with LDL cholesterol goal below 70 mg/dL. I also advised the patient to eat a healthy diet with plenty of whole grains, cereals, fruits and vegetables, exercise regularly and maintain ideal body weight I advised the patient to continue fish oil and add Epogen for her mild memory loss and increase participation in cognitively challenging activities like solving crossword puzzles, sudoku and playing bridge. We also discussed memory compensation strategies. Followup in the future with my nurse practitioner In 6 months or call earlier if necessary. Memory Compensation Strategies  1. Use "WARM" strategy.  W= write it down  A= associate it  R= repeat it  M= make a mental note  2.   You can keep a Social worker.  Use a 3-ring notebook with sections for the following: calendar, important names and phone numbers,  medications, doctors' names/phone numbers, lists/reminders, and a section to journal what you did  each day.   3.    Use a calendar to write appointments down.  4.    Write yourself a schedule for the day.  This can be placed on the calendar or in a separate section of the Memory Notebook.  Keeping a  regular schedule can help memory.  5.    Use medication organizer with sections for each day or morning/evening pills.  You may need help loading it  6.    Keep a basket, or pegboard by the door.  Place items that you need to take out with you in the basket or on the pegboard.  You may also want to  include a message board for reminders.  7.    Use sticky notes.  Place sticky  notes with reminders in a place where the task is performed.  For example: " turn off the  stove" placed by the stove, "lock the door" placed on the door at eye level, " take your medications" on  the bathroom mirror or by the place where you normally take your medications.  8.    Use alarms/timers.  Use while cooking to remind yourself to check on food or as a reminder to take your medicine, or as a  reminder to make a call, or as a reminder to perform another task, etc.

## 2016-10-03 NOTE — Progress Notes (Signed)
Office Visit Note  Patient: Beverly Estrada             Date of Birth: 1929/11/05           MRN: [id number]             PCP: Ernestene Kiel, MD Referring: Raina Mina., MD Visit Date: 10/08/2016 Occupation: @GUAROCC @    Subjective:  Right knee pain   History of Present Illness: Beverly Estrada is a 83 y.o. female with history of rheumatoid arthritis and polymyalgia rheumatica. She states that she's been having pain and discomfort in her right knee joint. She believes her right knee joint swelling. Her trochanteric bursitis is improved. She is trying to taper her prednisone and is down to 1 mg by mouth daily now.  Activities of Daily Living:  Patient reports morning stiffness for 5  minutes.   Patient Reports nocturnal pain.  Difficulty dressing/grooming: Denies Difficulty climbing stairs: Reports Difficulty getting out of chair: Reports Difficulty using hands for taps, buttons, cutlery, and/or writing: Reports   Review of Systems  Constitutional: Negative.  Negative for fatigue.  HENT: Positive for mouth dryness. Negative for mouth sores.   Eyes: Positive for dryness.  Respiratory: Negative.  Negative for cough, shortness of breath and difficulty breathing.   Cardiovascular: Negative.  Negative for palpitations, hypertension and irregular heartbeat.  Gastrointestinal: Positive for constipation. Negative for blood in stool and diarrhea.  Endocrine: Negative.   Genitourinary: Negative.  Negative for nocturia.  Musculoskeletal: Positive for arthralgias, joint pain, joint swelling, myalgias and myalgias. Negative for muscle weakness and morning stiffness.  Skin: Positive for hair loss. Negative for color change, rash, ulcers and sensitivity to sunlight.  Neurological: Negative.  Negative for headaches.  Hematological: Negative.  Negative for swollen glands.  Psychiatric/Behavioral: Negative.  Negative for depressed mood and sleep disturbance. The patient is not  nervous/anxious.     PMFS History:  Patient Active Problem List   Diagnosis Date Noted  . Vision loss of right eye 10/07/2016  . Medication monitoring encounter 07/02/2016  . Trochanteric bursitis of both hips 07/02/2016  . High risk medication use 07/02/2016  . Osteopenia 02/01/2016  . RA (rheumatoid arthritis) ([place]) 01/30/2016  . Osteoarthritis of both knees 01/30/2016  . Osteoarthritis of both feet 01/30/2016  . DDD (degenerative disc disease), lumbar 01/30/2016  . Primary osteoarthritis of right hip 01/30/2016  . Depression 01/30/2016  . Choledocholithiasis 03/13/2015  . Hypothyroidism 03/13/2015  . Polymyalgia rheumatica ([place]) 03/13/2015    Past Medical History:  Diagnosis Date  . Arthritis   . DDD (degenerative disc disease), lumbar 01/30/2016  . Glaucoma   . Hypothyroidism   . Multiple drug allergies    to multiple antibiotics.   . Osteoarthritis of both feet 01/30/2016  . Osteoarthritis of both hands 01/30/2016  . Osteoarthritis of both knees 01/30/2016  . Osteoarthritis of right hand 01/30/2016   moderate  . Polymyalgia rheumatica ([place])   . RA (rheumatoid arthritis) ([place]) 01/30/2016   Sero Negative  . Skin cancer of nose   . Stroke [facility])     Family History  Problem Relation Age of Onset  . Diabetes Mellitus II Mother   . Stroke Mother   . Diabetes Mellitus II Brother   . Stroke Brother   . Diabetes Sister    Past Surgical History:  Procedure Laterality Date  . APPENDECTOMY    . CHOLECYSTECTOMY    . COLONOSCOPY  ~2013   pooyps removed, one of them large  per pt. Polyp pathology unclear. no rescreening study planned due to age  . LOOP RECORDER INSERTION N/A 08/23/2016   Procedure: Loop Recorder Insertion;  Surgeon: Thompson Grayer, MD;  Location: [facility];  Service: Cardiovascular;  Laterality: N/A;  . TEE WITHOUT CARDIOVERSION N/A 08/23/2016   Procedure: TRANSESOPHAGEAL ECHOCARDIOGRAM (TEE);  Surgeon: Larey Dresser, MD;  Location: [facility] ENDOSCOPY;   Service: Cardiovascular;  Laterality: N/A;  . TONSIL SURGERY     Social History   Social History Narrative  . No narrative on file     Objective: Vital Signs: BP 126/71   Pulse 66   Resp 14   Ht 5\' 4"  (1.626 m)   Wt 148 lb (67.1 kg)   BMI 25.40 kg/m    Physical Exam  Constitutional: She is oriented to person, place, and time. She appears well-developed and well-nourished.  HENT:  Head: Normocephalic and atraumatic.  Eyes: Conjunctivae and EOM are normal.  Neck: Normal range of motion.  Cardiovascular: Normal rate, regular rhythm, normal heart sounds and intact distal pulses.   Pulmonary/Chest: Effort normal and breath sounds normal.  Abdominal: Soft. Bowel sounds are normal.  Lymphadenopathy:    She has no cervical adenopathy.  Neurological: She is alert and oriented to person, place, and time.  Skin: Skin is warm and dry. Capillary refill takes less than 2 seconds.  Psychiatric: She has a normal mood and affect. Her behavior is normal.  Nursing note and vitals reviewed.    Musculoskeletal Exam: C-spine and thoracic lumbar spine good range of motion. Shoulder joints elbow joints wrist joint MCPs PIPs DIPs with good range of motion with no synovitis. Hip joints knee joints ankles MTPs PIPs DIPs are good range of motion. She is some discomfort range of motion of her right knee joint.  CDAI Exam: CDAI Homunculus Exam:   Tenderness:  RLE: tibiofemoral  Joint Counts:  CDAI Tender Joint count: 1 CDAI Swollen Joint count: 0  Global Assessments:  Patient Global Assessment: 2 Provider Global Assessment: 2  CDAI Calculated Score: 5    Investigation: No additional findings. CBC Latest Ref Rng & Units 07/04/2016 12/26/2015 03/15/2015  WBC 3.8 - 10.8 K/uL 6.4 5.4 5.9  Hemoglobin 11.7 - 15.5 g/dL 13.1 12.8 10.2(L)  Hematocrit 35.0 - 45.0 % 39.2 37 31.6(L)  Platelets 140 - 400 K/uL 288 204 159   CMP Latest Ref Rng & Units 07/04/2016 12/26/2015 03/15/2015  Glucose 65 - 99  mg/dL 100(H) - 140(H)  BUN 7 - 25 mg/dL 15 16 15   Creatinine 0.60 - 0.88 mg/dL 1.02(H) 0.7 0.67  Sodium 135 - 146 mmol/L 139 141 142  Potassium 3.5 - 5.3 mmol/L 4.7 4.7 3.3(L)  Chloride 98 - 110 mmol/L 102 - 110  CO2 20 - 31 mmol/L 26 - 23  Calcium 8.6 - 10.4 mg/dL 10.1 - 8.6(L)  Total Protein 6.1 - 8.1 g/dL 7.7 - 6.0(L)  Total Bilirubin 0.2 - 1.2 mg/dL 0.6 - 0.8  Alkaline Phos 33 - 130 U/L 60 64 44  AST 10 - 35 U/L 18 32 46(H)  ALT 6 - 29 U/L 8 29 63(H)    Imaging: No results found.  Speciality Comments: No specialty comments available.    Procedures:  No procedures performed Allergies: Biaxin [clarithromycin]; Augmentin [amoxicillin-pot clavulanate]; and Clindamycin/lincomycin   Assessment / Plan:     Visit Diagnoses: Rheumatoid arthritis of multiple sites with negative rheumatoid factor [facility]): Patient has no synovitis on examination. She's been tolerating methotrexate well.  High  risk medication use - Methotrexate 3 tablets by mouth every week, folic acid 1 mg by mouth daily, long term prednisone. Labs are due, we will check CBC and CMP today.  Polymyalgia rheumatica ([place]): She is supposed to taper off prednisone over the next couple of months.  Primary osteoarthritis of right hip: Minimal discomfort  Primary osteoarthritis of both knees: She has ongoing pain and discomfort in her bilateral knee joints  Primary osteoarthritis of both feet: Proper fitting shoes were discussed.  Osteopenia of multiple sites - DXA is  done by her PCP.  Vision loss of right eye - History of retinal artery occlusion  History of stroke - With subacute memory loss followed up by Dr. Leonie Man.  Medication monitoring encounter - Plan: CBC with Differential/Platelet, COMPLETE METABOLIC PANEL WITH GFR    Orders: No orders of the defined types were placed in this encounter.  No orders of the defined types were placed in this encounter.   Face-to-face time spent with patient was 30 minutes.  50% of time was spent in counseling and coordination of care.  Follow-Up Instructions: Return in about 5 months (around 03/10/2017) for Rheumatoid arthritis PMR.   Bo Merino, MD  Note - This record has been created using Editor, commissioning.  Chart creation errors have been sought, but may not always  have been located. Such creation errors do not reflect on  the standard of medical care.

## 2016-10-03 NOTE — Progress Notes (Signed)
[facility] [address] [place]. [place] [address] [phone]       OFFICE FOLLOW UP VISIT NOTE  Beverly. Beverly Estrada Date of Birth:  04/06/1929 Medical Record Number:  [id number]   Referring MD:  Ernestene Kiel  Reason for Referral:  strokes  HPI: Initial Consult  07/25/16 ; Beverly Estrada is an 83 year pleasant Caucasian lady seen today for initial consultation visit. She is accompanied by her daughter and niece. History is up 10 from the patient, family and review of medical records provided by primary physician. I have personally reviewed her MRI images. She states she noticed sudden onset of memory difficulties 3 weeks ago. She has to stop in mid sentence and search for words. She's also had trouble remembering recent information having to write things down and she's always had a great memory. She denied any headache, slurred speech, gait or balance difficulties. She has had diminished vision in the right eye following what she calls a stroke affecting her INR year ago. She was seen by ophthalmologist at that time but did not have the records. She does not remember having a stroke workup at that time. She saw her primary physician ordered an MRI scan of the brain which have personally reviewed done on 07/16/16 shows a tiny diffusion positive infarct in the left midbrain centrally 12 peduncle as well as a subacute infarct in the left lentiform nucleus which shows postcontrast enhancement as well. Is mild 83-related generalized cerebral atrophy and changes of small vessel disease. Patient states that her memory loss has persisted. Primary care physician also ordered a carotid ultrasound which I have reviewed which does not show significant extracranial stenosis. No vascular imaging study of the brain was obtained. Transthoracic echo has been ordered but not yet been completed. The patient denies any history suggestive of atrial fibrillation, palpitations, fainting episodes of  syncope. She has no known cardiac problems. She was previously on aspirin and Plavix has been added. She is tolerating these with minor bruising but no bleeding. She denies known vascular risk factors in the form of hypertension, hyperlipidemia or diabetes and did not have any recent lab work to see if lipid profile and hemoglobin A1c have been checked recently. Update 10/03/16 ; She returns for follow-up after initial consultation 2 months ago. She is accompanied by her daughter. Patient is starting aspirin and Plavix but does complain of easy bruising. She had cineangiogram of the brain and neck done which I personally reviewed showed only mild at this cord changes without significant stenosis. She did undergo loop recorder insertion by Dr. Rayann Heman on 08/23/16  and so for A. fib has not been found. She remains on Lipitor which is tolerating well without muscle aches and pains. She convinced her mild short-term memory difficulties which appear to be unchanged. She is not participating in any regular    cognitively challenging activities. She does take fish oil every day. She has no new complaints today. ROS:   14 system review of systems is positive for hearing loss, blurred vision, loss of vision, runny nose, memory loss, confusion, disinterest in activities and all other systems negative  PMH:  Past Medical History:  Diagnosis Date  . Arthritis   . DDD (degenerative disc disease), lumbar 01/30/2016  . Glaucoma   . Hypothyroidism   . Multiple drug allergies    to multiple antibiotics.   . Osteoarthritis of both feet 01/30/2016  . Osteoarthritis of both hands 01/30/2016  . Osteoarthritis of both  knees 01/30/2016  . Osteoarthritis of right hand 01/30/2016   moderate  . Polymyalgia rheumatica ([place])   . RA (rheumatoid arthritis) ([place]) 01/30/2016   Sero Negative  . Skin cancer of nose   . Stroke [facility])     Social History:  Social History   Social History  . Marital status: Widowed    Spouse  name: N/A  . Number of children: N/A  . Years of education: N/A   Occupational History  . Not on file.   Social History Main Topics  . Smoking status: Former Smoker    Years: 10.00    Types: Cigarettes    Quit date: 01/31/1970  . Smokeless tobacco: Never Used  . Alcohol use No  . Drug use: No  . Sexual activity: Not on file   Other Topics Concern  . Not on file   Social History Narrative  . No narrative on file    Medications:   Current Outpatient Prescriptions on File Prior to Visit  Medication Sig Dispense Refill  . aspirin EC 325 MG tablet Take 325 mg by mouth daily.    Marland Kitchen atorvastatin (LIPITOR) 40 MG tablet Take 40 mg by mouth daily.  5  . calcium-vitamin D 250-100 MG-UNIT tablet Take 1 tablet by mouth daily.     . clopidogrel (PLAVIX) 75 MG tablet Take 75 mg by mouth daily.  5  . folic acid (FOLVITE) 1 MG tablet Take 1 tablet (1 mg total) by mouth daily. 90 tablet 4  . levothyroxine (SYNTHROID, LEVOTHROID) 88 MCG tablet TAKE 1 TABLET BY MOUTH ON AN EMPTY STOMACH IN THE MORNING  2  . methotrexate (RHEUMATREX) 2.5 MG tablet TAKE 3 TABLETS BY MOUTH EVERY WEEK 12 tablet 0  . Multiple Vitamin (MULTIVITAMIN) tablet Take 1 tablet by mouth daily.    . Omega-3 Fatty Acids (FISH OIL) 1000 MG CAPS Take 1,000 mg by mouth daily.     . predniSONE (DELTASONE) 1 MG tablet Take 1 tablet (1 mg total) by mouth daily with breakfast. 90 tablet 1  . Tetrahydrozoline HCl (EYE DROPS OP) Place 1 drop into both eyes 3 (three) times daily as needed (dry eyes).      No current facility-administered medications on file prior to visit.     Allergies:   Allergies  Allergen Reactions  . Biaxin [Clarithromycin] Other (See Comments)    Insomnia, couldn't sleep because med tasted so bad   . Augmentin [Amoxicillin-Pot Clavulanate] Rash  . Clindamycin/Lincomycin Rash   Blood pressure 131/63, pulse 69, height 5' 5.5" (1.664 m), weight 146 lb 11.2 oz (66.5 kg).  Physical Exam General: well  developed, well nourished elderly Caucasian lady, seated, in no evident distress Head: head normocephalic and atraumatic.   Neck: supple with no carotid or supraclavicular bruits Cardiovascular: regular rate and rhythm, no murmurs Musculoskeletal: no deformity mild kyphoscloliosis Skin:  no rash/petichiae Vascular:  Normal pulses all extremities  Neurologic Exam Mental Status: Awake and fully alert. Oriented to place and time. Recent and remote memory intact.Diminished recall 0/3. Able to name only 7 animals having 4 legs. Attention span, concentration and fund of knowledge appropriate. Mood and affect appropriate.  Cranial Nerves: Fundoscopic exam not done. Pupils equal, briskly reactive to light. Extraocular movements full without nystagmus. Visual fields full to confrontation. Diminished visual acuity in the right eye. Hearing diminished bilaterally. Facial sensation intact. Face, tongue, palate moves normally and symmetrically.  Motor: Normal bulk and tone. Normal strength in all tested extremity muscles. Sensory.: intact to  touch , pinprick , position and vibratory sensation.  Coordination: Rapid alternating movements normal in all extremities. Finger-to-nose and heel-to-shin performed accurately bilaterally. Gait and Station: Arises from chair without difficulty. Stance is normal. Gait demonstrates normal stride length and balance . Not able to heel, toe and tandem walk without difficulty.  Reflexes: 1+ and symmetric. Toes downgoing.       ASSESSMENT: 83 year old lady with subacute memory loss possibly related to recent basal ganglia infarct as well as an acute clinically silent midbrain infarct as well. Remote history of right eye vision loss due to suspected retinal  artery occlusion.     PLAN: I had a long d/w patient and her daughter about her recent cryptogenic stroke, risk for recurrent stroke/TIAs, personally independently reviewed imaging studies and stroke evaluation results  and answered questions.Continue Plavix 75 mg daily   for secondary stroke prevention but discontinue aspirin and maintain strict control of hypertension with blood pressure goal below 130/90, diabetes with hemoglobin A1c goal below 6.5% and lipids with LDL cholesterol goal below 70 mg/dL. I also advised the patient to eat a healthy diet with plenty of whole grains, cereals, fruits and vegetables, exercise regularly and maintain ideal body weight I advised the patient to continue fish oil and add Epogen for her mild memory loss and increase participation in cognitively challenging activities like solving crossword puzzles, sudoku and playing bridge. We also discussed memory compensation strategies. Followup in the future with my nurse practitioner In 6 months or call earlier if necessary. Greater than 50% time during this 30 minute  visit was spent on counseling and coordination of care about her strokes, discussion of evaluation and treatment plan and answering questions   [facility] [address] [place], [place] [address]  Phone [phone] Fax [phone] Note: This document was prepared with digital dictation and possible smart phrase technology. Any transcriptional errors that result from this process are unintentional.

## 2016-10-07 DIAGNOSIS — E2839 Other primary ovarian failure: Secondary | ICD-10-CM | POA: Diagnosis not present

## 2016-10-07 DIAGNOSIS — I639 Cerebral infarction, unspecified: Secondary | ICD-10-CM | POA: Diagnosis not present

## 2016-10-07 DIAGNOSIS — M353 Polymyalgia rheumatica: Secondary | ICD-10-CM | POA: Diagnosis not present

## 2016-10-07 DIAGNOSIS — L989 Disorder of the skin and subcutaneous tissue, unspecified: Secondary | ICD-10-CM | POA: Diagnosis not present

## 2016-10-07 DIAGNOSIS — H5461 Unqualified visual loss, right eye, normal vision left eye: Secondary | ICD-10-CM | POA: Insufficient documentation

## 2016-10-07 DIAGNOSIS — E039 Hypothyroidism, unspecified: Secondary | ICD-10-CM | POA: Diagnosis not present

## 2016-10-08 ENCOUNTER — Ambulatory Visit (INDEPENDENT_AMBULATORY_CARE_PROVIDER_SITE_OTHER): Payer: Medicare Other | Admitting: Rheumatology

## 2016-10-08 ENCOUNTER — Other Ambulatory Visit: Payer: Self-pay | Admitting: *Deleted

## 2016-10-08 ENCOUNTER — Encounter: Payer: Self-pay | Admitting: Rheumatology

## 2016-10-08 VITALS — BP 126/71 | HR 66 | Resp 14 | Ht 64.0 in | Wt 148.0 lb

## 2016-10-08 DIAGNOSIS — H5461 Unqualified visual loss, right eye, normal vision left eye: Secondary | ICD-10-CM | POA: Diagnosis not present

## 2016-10-08 DIAGNOSIS — M19071 Primary osteoarthritis, right ankle and foot: Secondary | ICD-10-CM | POA: Diagnosis not present

## 2016-10-08 DIAGNOSIS — Z5181 Encounter for therapeutic drug level monitoring: Secondary | ICD-10-CM | POA: Diagnosis not present

## 2016-10-08 DIAGNOSIS — Z79899 Other long term (current) drug therapy: Secondary | ICD-10-CM

## 2016-10-08 DIAGNOSIS — M1611 Unilateral primary osteoarthritis, right hip: Secondary | ICD-10-CM

## 2016-10-08 DIAGNOSIS — I639 Cerebral infarction, unspecified: Secondary | ICD-10-CM

## 2016-10-08 DIAGNOSIS — Z8673 Personal history of transient ischemic attack (TIA), and cerebral infarction without residual deficits: Secondary | ICD-10-CM | POA: Diagnosis not present

## 2016-10-08 DIAGNOSIS — M170 Bilateral primary osteoarthritis of knee: Secondary | ICD-10-CM | POA: Diagnosis not present

## 2016-10-08 DIAGNOSIS — M8589 Other specified disorders of bone density and structure, multiple sites: Secondary | ICD-10-CM | POA: Diagnosis not present

## 2016-10-08 DIAGNOSIS — M19072 Primary osteoarthritis, left ankle and foot: Secondary | ICD-10-CM | POA: Diagnosis not present

## 2016-10-08 DIAGNOSIS — M0609 Rheumatoid arthritis without rheumatoid factor, multiple sites: Secondary | ICD-10-CM

## 2016-10-08 DIAGNOSIS — M353 Polymyalgia rheumatica: Secondary | ICD-10-CM | POA: Diagnosis not present

## 2016-10-08 NOTE — Patient Instructions (Signed)
Standing Labs We placed an order today for your standing lab work.    Please come back and get your standing labs in October and every 3 months  We have open lab Monday through Friday from 8:30-11:30 AM and 1:30-4 PM at the office of Dr. Coalton Arch.   The office is located at [address], [address], [place], [place] [address] No appointment is necessary.   Labs are drawn by [facility].  You may receive a bill from [facility] for your lab work. If you have any questions regarding directions or hours of operation,  please call [phone].    

## 2016-10-08 NOTE — Addendum Note (Signed)
Addended by: Carole Binning on: 10/08/2016 03:29 PM   Modules accepted: Orders

## 2016-10-09 ENCOUNTER — Ambulatory Visit: Payer: Medicare Other | Admitting: Rheumatology

## 2016-10-09 LAB — COMPLETE METABOLIC PANEL WITH GFR
ALT: 13 U/L (ref 6–29)
AST: 23 U/L (ref 10–35)
Albumin: 4.2 g/dL (ref 3.6–5.1)
Alkaline Phosphatase: 56 U/L (ref 33–130)
BUN: 16 mg/dL (ref 7–25)
CO2: 22 mmol/L (ref 20–31)
Calcium: 9.4 mg/dL (ref 8.6–10.4)
Chloride: 105 mmol/L (ref 98–110)
Creat: 0.64 mg/dL (ref 0.60–0.88)
GFR, Est African American: >89 mL/min (ref >=60)
GFR, Est Non African American: 82 mL/min (ref >=60)
Glucose, Bld: 139 mg/dL — ABNORMAL HIGH (ref 65–99)
Potassium: 4.4 mmol/L (ref 3.5–5.3)
Sodium: 139 mmol/L (ref 135–146)
Total Bilirubin: 0.8 mg/dL (ref 0.2–1.2)
Total Protein: 6.9 g/dL (ref 6.1–8.1)

## 2016-10-09 LAB — CBC WITH DIFFERENTIAL/PLATELET
Basophils Absolute: 0 cells/uL (ref 0–200)
Basophils Relative: 0 %
Eosinophils Absolute: 0 cells/uL — ABNORMAL LOW (ref 15–500)
Eosinophils Relative: 0 %
HCT: 36.6 % (ref 35.0–45.0)
Hemoglobin: 11.7 g/dL (ref 11.7–15.5)
Lymphocytes Relative: 16 %
Lymphs Abs: 1120 cells/uL (ref 850–3900)
MCH: 29.9 pg (ref 27.0–33.0)
MCHC: 32 g/dL (ref 32.0–36.0)
MCV: 93.6 fL (ref 80.0–100.0)
MPV: 9.3 fL (ref 7.5–12.5)
Monocytes Absolute: 1960 cells/uL — ABNORMAL HIGH (ref 200–950)
Monocytes Relative: 28 %
Neutro Abs: 3920 cells/uL (ref 1500–7800)
Neutrophils Relative %: 56 %
Platelets: 200 10*3/uL (ref 140–400)
RBC: 3.91 MIL/uL (ref 3.80–5.10)
RDW: 14.7 % (ref 11.0–15.0)
WBC: 7 10*3/uL (ref 3.8–10.8)

## 2016-10-09 NOTE — Progress Notes (Signed)
Mild increase in glucose. Probably not fasting

## 2016-10-10 DIAGNOSIS — L720 Epidermal cyst: Secondary | ICD-10-CM | POA: Diagnosis not present

## 2016-10-22 ENCOUNTER — Ambulatory Visit (INDEPENDENT_AMBULATORY_CARE_PROVIDER_SITE_OTHER): Payer: Medicare Other | Admitting: *Deleted

## 2016-10-22 DIAGNOSIS — I639 Cerebral infarction, unspecified: Secondary | ICD-10-CM | POA: Diagnosis not present

## 2016-10-23 NOTE — Progress Notes (Signed)
Carelink Summary Report / Loop Recorder 

## 2016-10-31 DIAGNOSIS — H348112 Central retinal vein occlusion, right eye, stable: Secondary | ICD-10-CM | POA: Diagnosis not present

## 2016-11-07 LAB — CUP PACEART REMOTE DEVICE CHECK
Date Time Interrogation Session: 20180724200808
Implantable Pulse Generator Implant Date: 20180525

## 2016-11-15 ENCOUNTER — Other Ambulatory Visit: Payer: Self-pay | Admitting: Rheumatology

## 2016-11-18 NOTE — Telephone Encounter (Signed)
10/08/16 last visit  03/10/17 next visit   CBC Latest Ref Rng & Units 10/08/2016 07/04/2016 12/26/2015  WBC 3.8 - 10.8 K/uL 7.0 6.4 5.4  Hemoglobin 11.7 - 15.5 g/dL 11.7 13.1 12.8  Hematocrit 35.0 - 45.0 % 36.6 39.2 37  Platelets 140 - 400 K/uL 200 288 204   CMP Latest Ref Rng & Units 10/08/2016 07/04/2016 12/26/2015  Glucose 65 - 99 mg/dL 139(H) 100(H) -  BUN 7 - 25 mg/dL 16 15 16   Creatinine 0.60 - 0.88 mg/dL 0.64 1.02(H) 0.7  Sodium 135 - 146 mmol/L 139 139 141  Potassium 3.5 - 5.3 mmol/L 4.4 4.7 4.7  Chloride 98 - 110 mmol/L 105 102 -  CO2 20 - 31 mmol/L 22 26 -  Calcium 8.6 - 10.4 mg/dL 9.4 10.1 -  Total Protein 6.1 - 8.1 g/dL 6.9 7.7 -  Total Bilirubin 0.2 - 1.2 mg/dL 0.8 0.6 -  Alkaline Phos 33 - 130 U/L 56 60 64  AST 10 - 35 U/L 23 18 32  ALT 6 - 29 U/L 13 8 29

## 2016-11-21 ENCOUNTER — Ambulatory Visit (INDEPENDENT_AMBULATORY_CARE_PROVIDER_SITE_OTHER): Payer: Medicare Other | Admitting: *Deleted

## 2016-11-21 DIAGNOSIS — I639 Cerebral infarction, unspecified: Secondary | ICD-10-CM | POA: Diagnosis not present

## 2016-11-22 NOTE — Progress Notes (Signed)
Carelink Summary Report / Loop Recorder 

## 2016-11-26 LAB — CUP PACEART REMOTE DEVICE CHECK
Date Time Interrogation Session: 20180823200833
Implantable Pulse Generator Implant Date: 20180525

## 2016-11-26 NOTE — Progress Notes (Signed)
Carelink summary report received. Battery status OK. Normal device function. No new symptom episodes, tachy episodes, brady, or pause episodes. No new AF episodes. Monthly summary reports and ROV/PRN 

## 2016-12-23 ENCOUNTER — Ambulatory Visit (INDEPENDENT_AMBULATORY_CARE_PROVIDER_SITE_OTHER): Payer: Medicare Other | Admitting: *Deleted

## 2016-12-23 DIAGNOSIS — I639 Cerebral infarction, unspecified: Secondary | ICD-10-CM

## 2016-12-23 NOTE — Progress Notes (Signed)
Carelink Summary Report / Loop Recorder 

## 2016-12-24 LAB — CUP PACEART REMOTE DEVICE CHECK
Date Time Interrogation Session: 20180922204337
Implantable Pulse Generator Implant Date: 20180525

## 2016-12-27 ENCOUNTER — Telehealth: Payer: Self-pay | Admitting: Rheumatology

## 2016-12-27 DIAGNOSIS — Z79899 Other long term (current) drug therapy: Secondary | ICD-10-CM

## 2016-12-27 NOTE — Telephone Encounter (Signed)
Patient going to lab at [facility] on October 10th for labs. Please send order for patient.

## 2016-12-30 NOTE — Telephone Encounter (Signed)
Lab Order faxed [phone]

## 2017-01-07 DIAGNOSIS — E039 Hypothyroidism, unspecified: Secondary | ICD-10-CM | POA: Diagnosis not present

## 2017-01-07 DIAGNOSIS — Z79899 Other long term (current) drug therapy: Secondary | ICD-10-CM | POA: Diagnosis not present

## 2017-01-07 DIAGNOSIS — K5900 Constipation, unspecified: Secondary | ICD-10-CM | POA: Diagnosis not present

## 2017-01-07 DIAGNOSIS — E119 Type 2 diabetes mellitus without complications: Secondary | ICD-10-CM | POA: Diagnosis not present

## 2017-01-07 DIAGNOSIS — R413 Other amnesia: Secondary | ICD-10-CM | POA: Diagnosis not present

## 2017-01-07 DIAGNOSIS — I639 Cerebral infarction, unspecified: Secondary | ICD-10-CM | POA: Diagnosis not present

## 2017-01-08 ENCOUNTER — Telehealth: Payer: Self-pay | Admitting: Rheumatology

## 2017-01-08 DIAGNOSIS — Z79899 Other long term (current) drug therapy: Secondary | ICD-10-CM | POA: Diagnosis not present

## 2017-01-08 NOTE — Telephone Encounter (Signed)
Patient there now, please fax orders to outpt center fax# [phone], or [phone]

## 2017-01-08 NOTE — Telephone Encounter (Signed)
Lab Orders refaxed

## 2017-01-16 DIAGNOSIS — E2839 Other primary ovarian failure: Secondary | ICD-10-CM | POA: Diagnosis not present

## 2017-01-16 DIAGNOSIS — M810 Age-related osteoporosis without current pathological fracture: Secondary | ICD-10-CM | POA: Diagnosis not present

## 2017-01-20 ENCOUNTER — Ambulatory Visit (INDEPENDENT_AMBULATORY_CARE_PROVIDER_SITE_OTHER): Payer: Medicare Other | Admitting: *Deleted

## 2017-01-20 DIAGNOSIS — I639 Cerebral infarction, unspecified: Secondary | ICD-10-CM

## 2017-01-21 DIAGNOSIS — Z1231 Encounter for screening mammogram for malignant neoplasm of breast: Secondary | ICD-10-CM | POA: Diagnosis not present

## 2017-01-22 LAB — CUP PACEART REMOTE DEVICE CHECK
Date Time Interrogation Session: 20181022213952
Implantable Pulse Generator Implant Date: 20180525

## 2017-01-22 NOTE — Progress Notes (Signed)
Carelink Summary Report / Loop Recorder 

## 2017-01-23 DIAGNOSIS — Z23 Encounter for immunization: Secondary | ICD-10-CM | POA: Diagnosis not present

## 2017-02-03 ENCOUNTER — Other Ambulatory Visit: Payer: Self-pay | Admitting: Rheumatology

## 2017-02-03 NOTE — Telephone Encounter (Signed)
10/08/16 last visit  03/10/17 next visit Labs: 01/08/17 WNL  Okay to refill per Dr. Estanislado Pandy

## 2017-02-13 DIAGNOSIS — H348112 Central retinal vein occlusion, right eye, stable: Secondary | ICD-10-CM | POA: Diagnosis not present

## 2017-02-18 DIAGNOSIS — M810 Age-related osteoporosis without current pathological fracture: Secondary | ICD-10-CM | POA: Diagnosis not present

## 2017-02-19 ENCOUNTER — Ambulatory Visit (INDEPENDENT_AMBULATORY_CARE_PROVIDER_SITE_OTHER): Payer: Medicare Other | Admitting: *Deleted

## 2017-02-19 DIAGNOSIS — I639 Cerebral infarction, unspecified: Secondary | ICD-10-CM | POA: Diagnosis not present

## 2017-02-24 NOTE — Progress Notes (Signed)
Carelink Summary Report / Loop Recorder 

## 2017-02-25 NOTE — Progress Notes (Deleted)
Office Visit Note  Patient: Beverly Estrada             Date of Birth: [birth year]           MRN: [id number]             PCP: Ernestene Kiel, MD Referring: Ernestene Kiel, MD Visit Date: 03/10/2017 Occupation: @GUAROCC @    Subjective:  No chief complaint on file.   History of Present Illness: Beverly Estrada is a 83 y.o. female ***   Activities of Daily Living:  Patient reports morning stiffness for *** {minute/hour:19697}.   Patient {ACTIONS;DENIES/REPORTS:21021675::"Denies"} nocturnal pain.  Difficulty dressing/grooming: {ACTIONS;DENIES/REPORTS:21021675::"Denies"} Difficulty climbing stairs: {ACTIONS;DENIES/REPORTS:21021675::"Denies"} Difficulty getting out of chair: {ACTIONS;DENIES/REPORTS:21021675::"Denies"} Difficulty using hands for taps, buttons, cutlery, and/or writing: {ACTIONS;DENIES/REPORTS:21021675::"Denies"}   No Rheumatology ROS completed.   PMFS History:  Patient Active Problem List   Diagnosis Date Noted  . Vision loss of right eye 10/07/2016  . Medication monitoring encounter 07/02/2016  . Trochanteric bursitis of both hips 07/02/2016  . High risk medication use 07/02/2016  . Osteopenia 02/01/2016  . RA (rheumatoid arthritis) ([place]) 01/30/2016  . Osteoarthritis of both knees 01/30/2016  . Osteoarthritis of both feet 01/30/2016  . DDD (degenerative disc disease), lumbar 01/30/2016  . Primary osteoarthritis of right hip 01/30/2016  . Depression 01/30/2016  . Choledocholithiasis 03/13/2015  . Hypothyroidism 03/13/2015  . Polymyalgia rheumatica ([place]) 03/13/2015    Past Medical History:  Diagnosis Date  . Arthritis   . DDD (degenerative disc disease), lumbar 01/30/2016  . Glaucoma   . Hypothyroidism   . Multiple drug allergies    to multiple antibiotics.   . Osteoarthritis of both feet 01/30/2016  . Osteoarthritis of both hands 01/30/2016  . Osteoarthritis of both knees 01/30/2016  . Osteoarthritis of right hand 01/30/2016   moderate    . Polymyalgia rheumatica ([place])   . RA (rheumatoid arthritis) ([place]) 01/30/2016   Sero Negative  . Skin cancer of nose   . Stroke [facility])     Family History  Problem Relation Age of Onset  . Diabetes Mellitus II Mother   . Stroke Mother   . Diabetes Mellitus II Brother   . Stroke Brother   . Diabetes Sister    Past Surgical History:  Procedure Laterality Date  . APPENDECTOMY    . CHOLECYSTECTOMY    . COLONOSCOPY  ~2013   pooyps removed, one of them large per pt. Polyp pathology unclear. no rescreening study planned due to age  . LOOP RECORDER INSERTION N/A 08/23/2016   Procedure: Loop Recorder Insertion;  Surgeon: Thompson Grayer, MD;  Location: [facility] LAB;  Service: Cardiovascular;  Laterality: N/A;  . TEE WITHOUT CARDIOVERSION N/A 08/23/2016   Procedure: TRANSESOPHAGEAL ECHOCARDIOGRAM (TEE);  Surgeon: Larey Dresser, MD;  Location: [facility] ENDOSCOPY;  Service: Cardiovascular;  Laterality: N/A;  . TONSIL SURGERY     Social History   Social History Narrative  . Not on file     Objective: Vital Signs: There were no vitals taken for this visit.   Physical Exam   Musculoskeletal Exam: ***  CDAI Exam: No CDAI exam completed.    Investigation: No additional findings. CBC Latest Ref Rng & Units 10/08/2016 07/04/2016 12/26/2015  WBC 3.8 - 10.8 K/uL 7.0 6.4 5.4  Hemoglobin 11.7 - 15.5 g/dL 11.7 13.1 12.8  Hematocrit 35.0 - 45.0 % 36.6 39.2 37  Platelets 140 - 400 K/uL 200 288 204   CMP Latest Ref Rng & Units 10/08/2016 07/04/2016 12/26/2015  Glucose 65 - 99 mg/dL 139(H) 100(H) -  BUN 7 - 25 mg/dL 16 15 16   Creatinine 0.60 - 0.88 mg/dL 0.64 1.02(H) 0.7  Sodium 135 - 146 mmol/L 139 139 141  Potassium 3.5 - 5.3 mmol/L 4.4 4.7 4.7  Chloride 98 - 110 mmol/L 105 102 -  CO2 20 - 31 mmol/L 22 26 -  Calcium 8.6 - 10.4 mg/dL 9.4 10.1 -  Total Protein 6.1 - 8.1 g/dL 6.9 7.7 -  Total Bilirubin 0.2 - 1.2 mg/dL 0.8 0.6 -  Alkaline Phos 33 - 130 U/L 56 60 64  AST 10 - 35 U/L 23 18 32   ALT 6 - 29 U/L 13 8 29     Imaging: No results found.  Speciality Comments: No specialty comments available.    Procedures:  No procedures performed Allergies: Biaxin [clarithromycin]; Augmentin [amoxicillin-pot clavulanate]; and Clindamycin/lincomycin   Assessment / Plan:     Visit Diagnoses: No diagnosis found.    Orders: No orders of the defined types were placed in this encounter.  No orders of the defined types were placed in this encounter.   Face-to-face time spent with patient was *** minutes. 50% of time was spent in counseling and coordination of care.  Follow-Up Instructions: No Follow-up on file.   Earnestine Mealing, CMA  Note - This record has been created using Editor, commissioning.  Chart creation errors have been sought, but may not always  have been located. Such creation errors do not reflect on  the standard of medical care.

## 2017-03-05 LAB — CUP PACEART REMOTE DEVICE CHECK
Date Time Interrogation Session: 20181121213840
Implantable Pulse Generator Implant Date: 20180525

## 2017-03-10 ENCOUNTER — Ambulatory Visit: Payer: Medicare Other | Admitting: Rheumatology

## 2017-03-20 NOTE — Progress Notes (Signed)
Office Visit Note  Patient: Beverly Estrada             Date of Birth: 08/07/1929           MRN: [id number]             PCP: Ernestene Kiel, MD Referring: Ernestene Kiel, MD Visit Date: 04/03/2017 Occupation: @GUAROCC @    Subjective:  Lower back pain   History of Present Illness: Beverly Estrada is a 83 y.o. female with history of seronegative arthritis, PMR, DDD of lumbar, and osteoarthritis.  Patient states her MTX seems to be controlling her rheumatoid arthritis.  She continues to take MTX 3 tablets weekly and folic acid 1 mg daily.  She states she has occasional hand swelling and pain in her toes.  She states her right knee is painful and has minimal swelling occasionally.  She states her trochanteric bursitis has improved significantly.   She states her main concern is her lower back pain.  She denies nocturnal pain or radiation of pain.  She states her lower back is very stiff.  She denies using heat or ice therapy.  She was walking 3 times weekly a few weeks ago but has been unable to due to lower back pain.     Activities of Daily Living:  Patient reports morning stiffness for 1 hour.   Patient Denies nocturnal pain.  Difficulty dressing/grooming: Denies Difficulty climbing stairs: Denies Difficulty getting out of chair: Reports Difficulty using hands for taps, buttons, cutlery, and/or writing: Reports   Review of Systems  Constitutional: Positive for fatigue. Negative for weakness.  HENT: Positive for mouth dryness. Negative for mouth sores and nose dryness.   Eyes: Positive for dryness. Negative for redness.  Respiratory: Negative for cough, hemoptysis, shortness of breath and difficulty breathing.   Cardiovascular: Negative for chest pain, palpitations, hypertension, irregular heartbeat and swelling in legs/feet.  Gastrointestinal: Positive for constipation. Negative for blood in stool and diarrhea.  Endocrine: Negative for increased urination.    Genitourinary: Negative for painful urination.  Musculoskeletal: Positive for arthralgias, joint pain and morning stiffness. Negative for joint swelling, myalgias, muscle weakness, muscle tenderness and myalgias.  Skin: Negative for color change, pallor, rash, hair loss, nodules/bumps, redness, skin tightness, ulcers and sensitivity to sunlight.  Neurological: Negative for dizziness, numbness and headaches.  Hematological: Negative for swollen glands.  Psychiatric/Behavioral: Positive for sleep disturbance. Negative for depressed mood. The patient is not nervous/anxious.     PMFS History:  Patient Active Problem List   Diagnosis Date Noted  . Vision loss of right eye 10/07/2016  . Medication monitoring encounter 07/02/2016  . Trochanteric bursitis of both hips 07/02/2016  . High risk medication use 07/02/2016  . Osteopenia 02/01/2016  . RA (rheumatoid arthritis) ([place]) 01/30/2016  . Osteoarthritis of both knees 01/30/2016  . Osteoarthritis of both feet 01/30/2016  . DDD (degenerative disc disease), lumbar 01/30/2016  . Primary osteoarthritis of right hip 01/30/2016  . Depression 01/30/2016  . Choledocholithiasis 03/13/2015  . Hypothyroidism 03/13/2015  . Polymyalgia rheumatica ([place]) 03/13/2015    Past Medical History:  Diagnosis Date  . Arthritis   . DDD (degenerative disc disease), lumbar 01/30/2016  . Glaucoma   . Hypothyroidism   . Multiple drug allergies    to multiple antibiotics.   . Osteoarthritis of both feet 01/30/2016  . Osteoarthritis of both hands 01/30/2016  . Osteoarthritis of both knees 01/30/2016  . Osteoarthritis of right hand 01/30/2016   moderate  . Polymyalgia rheumatica ([place])   .  RA (rheumatoid arthritis) ([place]) 01/30/2016   Sero Negative  . Skin cancer of nose   . Stroke [facility])    right eye     Family History  Problem Relation Age of Onset  . Diabetes Mellitus II Mother   . Stroke Mother   . Diabetes Mellitus II Brother   . Stroke Brother   .  Diabetes Brother   . Diabetes Sister   . Diabetes Sister   . Diabetes Sister   . Diabetes Sister   . Diabetes Brother   . Diabetes Brother   . Diabetes Brother   . Diabetes Brother   . Diabetes Brother    Past Surgical History:  Procedure Laterality Date  . APPENDECTOMY    . CHOLECYSTECTOMY    . COLONOSCOPY  ~2013   pooyps removed, one of them large per pt. Polyp pathology unclear. no rescreening study planned due to age  . LOOP RECORDER INSERTION N/A 08/23/2016   Procedure: Loop Recorder Insertion;  Surgeon: Thompson Grayer, MD;  Location: [facility];  Service: Cardiovascular;  Laterality: N/A;  . TEE WITHOUT CARDIOVERSION N/A 08/23/2016   Procedure: TRANSESOPHAGEAL ECHOCARDIOGRAM (TEE);  Surgeon: Larey Dresser, MD;  Location: [facility] ENDOSCOPY;  Service: Cardiovascular;  Laterality: N/A;  . TONSIL SURGERY     Social History   Social History Narrative  . Not on file     Objective: Vital Signs: BP (!) 102/58 (BP Location: Left Arm, Patient Position: Sitting, Cuff Size: Normal)   Pulse 61   Resp 17   Ht 5\' 4"  (1.626 m)   Wt 150 lb (68 kg)   BMI 25.75 kg/m    Physical Exam   Musculoskeletal Exam: C-spine good ROM.  Thoracic kyphosis noted.  Lumbar limited ROM with discomfort. No midline spinal tenderness, no SI joint tenderness. Shoulder joints, elbow joints, wrists good ROM.  Bilateral 2nd MCP limited ROM.  All other MCPs, PIPs, and DIPs good ROM with no synovitis.  Hip joints very limited external rotation with discomfort. No tenderness of trochanteric bursa.  Right knee discomfort with ROM.  Knee crepitus bilaterally.  Ankles good ROM with no synovitis.  DIP and PIP discomfort.  MTPs rubbing on top of shoes.      CDAI Exam: CDAI Homunculus Exam:   Joint Counts:  CDAI Tender Joint count: 0 CDAI Swollen Joint count: 0  Global Assessments:  Patient Global Assessment: 4 Provider Global Assessment: 3  CDAI Calculated Score: 7    Investigation: No additional  findings. CBC Latest Ref Rng & Units 10/08/2016 07/04/2016 12/26/2015  WBC 3.8 - 10.8 K/uL 7.0 6.4 5.4  Hemoglobin 11.7 - 15.5 g/dL 11.7 13.1 12.8  Hematocrit 35.0 - 45.0 % 36.6 39.2 37  Platelets 140 - 400 K/uL 200 288 204   CMP Latest Ref Rng & Units 10/08/2016 07/04/2016 12/26/2015  Glucose 65 - 99 mg/dL 139(H) 100(H) -  BUN 7 - 25 mg/dL 16 15 16   Creatinine 0.60 - 0.88 mg/dL 0.64 1.02(H) 0.7  Sodium 135 - 146 mmol/L 139 139 141  Potassium 3.5 - 5.3 mmol/L 4.4 4.7 4.7  Chloride 98 - 110 mmol/L 105 102 -  CO2 20 - 31 mmol/L 22 26 -  Calcium 8.6 - 10.4 mg/dL 9.4 10.1 -  Total Protein 6.1 - 8.1 g/dL 6.9 7.7 -  Total Bilirubin 0.2 - 1.2 mg/dL 0.8 0.6 -  Alkaline Phos 33 - 130 U/L 56 60 64  AST 10 - 35 U/L 23 18 32  ALT 6 -  29 U/L 13 8 29     Imaging: Xr Foot 2 Views Left  Result Date: 04/03/2017 First MTP narrowing and subluxation was noted. All PIP/DIP narrowing was noted. All MTP joint space narrowing was noted. Cystic changes were noted in all metatarsal joints. No intertarsal joint space narrowing was noted. A small calcaneal spur was noted. Juxta articular osteopenia was noted. Impression: These signs and consistent with osteoarthritis and rheumatoid arthritis overlap.  Xr Foot 2 Views Right  Result Date: 04/03/2017 First MTP PIP, all DIP narrowing was noted. Second, third and fourth MTP narrowing was noted. Some cystic changes were noted in the MTP joints. Juxta articular osteopenia was noted. Calcaneal spur was noted. Impression: These findings are consistent with osteoarthritis and rheumatoid arthritis overlap.  Xr Hand 2 View Left  Result Date: 04/03/2017 Right first second and third MCP narrowing. All PIP/DIP narrowing was noted. No significant intercarpal joint space narrowing or radial carpal joint space narrowing was noted. No erosive changes were noted. Juxta articular osteopenia was noted. Impression: These findings are consistent with osteoarthritis and rheumatoid arthritis  overlap.  Xr Hand 2 View Right  Result Date: 04/03/2017 Right first second and third MCP narrowing. All PIP/DIP narrowing was noted. No significant intercarpal joint space narrowing or radial carpal joint space narrowing was noted. No erosive changes were noted. Juxta articular osteopenia was noted. Impression: These findings are consistent with osteoarthritis and rheumatoid arthritis overlap.   Speciality Comments: No specialty comments available.    Procedures:  No procedures performed Allergies: Biaxin [clarithromycin]; Augmentin [amoxicillin-pot clavulanate]; and Clindamycin/lincomycin   Assessment / Plan:     Visit Diagnoses: Rheumatoid arthritis of multiple sites with negative rheumatoid factor ([place]) - She has no synovitis on exam.  She has no hand pain.  She has some discomfort in her bilateral feet. She will continue on MTX 3 tablets weekly and folic acid 1 mg daily.  X-rays of her hands and feet were obtained today to monitor progression of her RA. Plan: XR Hand 2 View Right, XR Hand 2 View Left. X-rays today did not show any progression of the disease processes. She does have a lot of osteoarthritis which is causing discomfort as well.  High risk medication use - Methotrexate 3 tablets by mouth every week, folic acid 1 mg by mouth daily -CBC and CMP were drawn today.  Standing orders were placed.  We printed out her lab orders that she can take with her.   Plan: CBC with Differential/Platelet, COMPLETE METABOLIC PANEL WITH GFR, CBC with Differential/Platelet, COMPLETE METABOLIC PANEL WITH GFR, COMPLETE METABOLIC PANEL WITH GFR, CBC with Differential/Platelet  Polymyalgia rheumatica ([place]): She tapered off her Prednisone.  She does not recall when her last dose was but she believes it was a few months ago.  She has not had any flares of PMR.    Primary osteoarthritis of right hip: She continues to have discomfort and stiffness.  She has very limited ROM on exam.  She has seen Dr. Ernestina Patches  in the past for injections of her left hip.  A handout of hip exercises was provided to the patient.    Primary osteoarthritis of both knees: Her right knee causes her occasional discomfort.  Knee crepitus bilaterally.    Primary osteoarthritis of both feet: She is having discomfort in her toes.  Her MTPs are rubbing on her shoes.  We discussed trying metatarsal pads or going to [facility] to be fitted for orthotics.    Pain in both feet - X-rays  of her feet were obtained due to her discomfort.  Plan: XR Foot 2 Views Left, XR Foot 2 Views Right   Trochanteric bursitis of both hips: Improved.  No tenderness on exam.    DDD (degenerative disc disease), lumbar: She is having significant discomfort in her lumbar spine.  She has seen Dr. Ernestina Patches in the past for injections.      Osteopenia of multiple sites - DXA is  done by her PCP.  Other medical conditions are listed as follows:   Vision loss of right eye -  History of retinal artery occlusion  History of stroke - With subacute memory loss followed up by Dr. Leonie Man.  History of depression  History of hypothyroidism     Orders: Orders Placed This Encounter  Procedures  . XR Hand 2 View Right  . XR Hand 2 View Left  . XR Foot 2 Views Left  . XR Foot 2 Views Right  . CBC with Differential/Platelet  . COMPLETE METABOLIC PANEL WITH GFR  . CBC with Differential/Platelet  . COMPLETE METABOLIC PANEL WITH GFR   No orders of the defined types were placed in this encounter.   Face-to-face time spent with patient was 30 minutes. Greater than 50% of time was spent in counseling and coordination of care.  Follow-Up Instructions: Return in about 5 months (around 09/01/2017) for Rheumatoid arthritis. Bo Merino, MD  Note - This record has been created using Editor, commissioning.  Chart creation errors have been sought, but may not always  have been located. Such creation errors do not reflect on  the standard of medical care.

## 2017-03-21 ENCOUNTER — Ambulatory Visit (INDEPENDENT_AMBULATORY_CARE_PROVIDER_SITE_OTHER): Payer: Medicare Other | Admitting: *Deleted

## 2017-03-21 DIAGNOSIS — I639 Cerebral infarction, unspecified: Secondary | ICD-10-CM | POA: Diagnosis not present

## 2017-03-26 NOTE — Progress Notes (Signed)
Carelink Summary Report / Loop Recorder 

## 2017-04-03 ENCOUNTER — Ambulatory Visit (INDEPENDENT_AMBULATORY_CARE_PROVIDER_SITE_OTHER): Payer: Self-pay

## 2017-04-03 ENCOUNTER — Encounter: Payer: Self-pay | Admitting: Rheumatology

## 2017-04-03 ENCOUNTER — Ambulatory Visit (INDEPENDENT_AMBULATORY_CARE_PROVIDER_SITE_OTHER): Payer: Medicare Other | Admitting: Rheumatology

## 2017-04-03 VITALS — BP 102/58 | HR 61 | Resp 17 | Ht 64.0 in | Wt 150.0 lb

## 2017-04-03 DIAGNOSIS — M5136 Other intervertebral disc degeneration, lumbar region: Secondary | ICD-10-CM | POA: Diagnosis not present

## 2017-04-03 DIAGNOSIS — M8589 Other specified disorders of bone density and structure, multiple sites: Secondary | ICD-10-CM

## 2017-04-03 DIAGNOSIS — Z79899 Other long term (current) drug therapy: Secondary | ICD-10-CM | POA: Diagnosis not present

## 2017-04-03 DIAGNOSIS — M0609 Rheumatoid arthritis without rheumatoid factor, multiple sites: Secondary | ICD-10-CM | POA: Diagnosis not present

## 2017-04-03 DIAGNOSIS — M7062 Trochanteric bursitis, left hip: Secondary | ICD-10-CM

## 2017-04-03 DIAGNOSIS — M19072 Primary osteoarthritis, left ankle and foot: Secondary | ICD-10-CM

## 2017-04-03 DIAGNOSIS — M170 Bilateral primary osteoarthritis of knee: Secondary | ICD-10-CM

## 2017-04-03 DIAGNOSIS — M19071 Primary osteoarthritis, right ankle and foot: Secondary | ICD-10-CM

## 2017-04-03 DIAGNOSIS — Z8639 Personal history of other endocrine, nutritional and metabolic disease: Secondary | ICD-10-CM

## 2017-04-03 DIAGNOSIS — M1611 Unilateral primary osteoarthritis, right hip: Secondary | ICD-10-CM

## 2017-04-03 DIAGNOSIS — Z8673 Personal history of transient ischemic attack (TIA), and cerebral infarction without residual deficits: Secondary | ICD-10-CM | POA: Diagnosis not present

## 2017-04-03 DIAGNOSIS — Z5181 Encounter for therapeutic drug level monitoring: Secondary | ICD-10-CM | POA: Diagnosis not present

## 2017-04-03 DIAGNOSIS — M79672 Pain in left foot: Secondary | ICD-10-CM

## 2017-04-03 DIAGNOSIS — M79671 Pain in right foot: Secondary | ICD-10-CM | POA: Diagnosis not present

## 2017-04-03 DIAGNOSIS — M353 Polymyalgia rheumatica: Secondary | ICD-10-CM

## 2017-04-03 DIAGNOSIS — H5461 Unqualified visual loss, right eye, normal vision left eye: Secondary | ICD-10-CM | POA: Diagnosis not present

## 2017-04-03 DIAGNOSIS — M7061 Trochanteric bursitis, right hip: Secondary | ICD-10-CM

## 2017-04-03 DIAGNOSIS — Z8659 Personal history of other mental and behavioral disorders: Secondary | ICD-10-CM

## 2017-04-03 LAB — CUP PACEART REMOTE DEVICE CHECK
Date Time Interrogation Session: 20181221213817
Implantable Pulse Generator Implant Date: 20180525

## 2017-04-03 NOTE — Patient Instructions (Addendum)
Metatarsal pads in shoes  Another option is going to [facility] and get fitted for orthotics, which can be filled through insurance and medicare will cover these.   Hip Exercises Ask your health care provider which exercises are safe for you. Do exercises exactly as told by your health care provider and adjust them as directed. It is normal to feel mild stretching, pulling, tightness, or discomfort as you do these exercises, but you should stop right away if you feel sudden pain or your pain gets worse.Do not begin these exercises until told by your health care provider. STRETCHING AND RANGE OF MOTION EXERCISES These exercises warm up your muscles and joints and improve the movement and flexibility of your hip. These exercises also help to relieve pain, numbness, and tingling. Exercise A: Hamstrings, Supine  1. Lie on your back. 2. Loop a belt or towel over the ball of your left / rightfoot. The ball of your foot is on the walking surface, right under your toes. 3. Straighten your left / rightknee and slowly pull on the belt to raise your leg. ? Do not let your left / right knee bend while you do this. ? Keep your other leg flat on the floor. ? Raise the left / right leg until you feel a gentle stretch behind your left / right knee or thigh. 4. Hold this position for __________ seconds. 5. Slowly return your leg to the starting position. Repeat __________ times. Complete this stretch __________ times a day. Exercise B: Hip Rotators  1. Lie on your back on a firm surface. 2. Hold your left / right knee with your left / right hand. Hold your ankle with your other hand. 3. Gently pull your left / right knee and rotate your lower leg toward your other shoulder. ? Pull until you feel a stretch in your buttocks. ? Keep your hips and shoulders firmly planted while you do this stretch. 4. Hold this position for __________ seconds. Repeat __________ times. Complete this stretch __________ times a  day. Exercise C: V-Sit (Hamstrings and Adductors)  1. Sit on the floor with your legs extended in a large "V" shape. Keep your knees straight during this exercise. 2. Start with your head and chest upright, then bend at your waist to reach for your left foot (position A). You should feel a stretch in your right inner thigh. 3. Hold this position for __________ seconds. Then slowly return to the upright position. 4. Bend at your waist to reach forward (position B). You should feel a stretch behind both of your thighs and knees. 5. Hold this position for __________ seconds. Then slowly return to the upright position. 6. Bend at your waist to reach for your right foot (position C). You should feel a stretch in your left inner thigh. 7. Hold this position for __________ seconds. Then slowly return to the upright position. Repeat __________ times. Complete this stretch __________ times a day. Exercise D: Lunge (Hip Flexors)  1. Place your left / right knee on the floor and bend your other knee so that is directly over your ankle. You should be half-kneeling. 2. Keep good posture with your head over your shoulders. 3. Tighten your buttocks to point your tailbone downward. This helps your back to keep from arching too much. 4. You should feel a gentle stretch in the front of your left / right thigh and hip. If you do not feel any resistance, slightly slide your other foot forward and then slowly  lunge forward so your knee once again lines up over your ankle. 5. Make sure your tailbone continues to point downward. 6. Hold this position for __________ seconds. Repeat __________ times. Complete this stretch __________ times a day. STRENGTHENING EXERCISES These exercises build strength and endurance in your hip. Endurance is the ability to use your muscles for a long time, even after they get tired. Exercise E: Bridge (Hip Extensors)  1. Lie on your back on a firm surface with your knees bent and your  feet flat on the floor. 2. Tighten your buttocks muscles and lift your bottom off the floor until the trunk of your body is level with your thighs. ? Do not arch your back. ? You should feel the muscles working in your buttocks and the back of your thighs. If you do not feel these muscles, slide your feet 1-2 inches (2.5-5 cm) farther away from your buttocks. 3. Hold this position for __________ seconds. 4. Slowly lower your hips to the starting position. 5. Let your muscles relax completely between repetitions. 6. If this exercise is too easy, try doing it with your arms crossed over your chest. Repeat __________ times. Complete this exercise __________ times a day. Exercise F: Straight Leg Raises - Hip Abductors  1. Lie on your side with your left / right leg in the top position. Lie so your head, shoulder, knee, and hip line up with each other. You may bend your bottom knee to help you balance. 2. Roll your hips slightly forward, so your hips are stacked directly over each other and your left / right knee is facing forward. 3. Leading with your heel, lift your top leg 4-6 inches (10-15 cm). You should feel the muscles in your outer hip lifting. ? Do not let your foot drift forward. ? Do not let your knee roll toward the ceiling. 4. Hold this position for __________ seconds. 5. Slowly return to the starting position. 6. Let your muscles relax completely between repetitions. Repeat __________ times. Complete this exercise __________ times a day. Exercise G: Straight Leg Raises - Hip Adductors  1. Lie on your side with your left / right leg in the bottom position. Lie so your head, shoulder, knee, and hip line up. You may place your upper foot in front to help you balance. 2. Roll your hips slightly forward, so your hips are stacked directly over each other and your left / right knee is facing forward. 3. Tense the muscles in your inner thigh and lift your bottom leg 4-6 inches (10-15  cm). 4. Hold this position for __________ seconds. 5. Slowly return to the starting position. 6. Let your muscles relax completely between repetitions. Repeat __________ times. Complete this exercise __________ times a day. Exercise H: Straight Leg Raises - Quadriceps  1. Lie on your back with your left / right leg extended and your other knee bent. 2. Tense the muscles in the front of your left / right thigh. When you do this, you should see your kneecap slide up or see increased dimpling just above your knee. 3. Tighten these muscles even more and raise your leg 4-6 inches (10-15 cm) off the floor. 4. Hold this position for __________ seconds. 5. Keep these muscles tense as you lower your leg. 6. Relax the muscles slowly and completely between repetitions. Repeat __________ times. Complete this exercise __________ times a day. Exercise I: Hip Abductors, Standing 1. Tie one end of a rubber exercise band or tubing to a  secure surface, such as a table or pole. 2. Loop the other end of the band or tubing around your left / right ankle. 3. Keeping your ankle with the band or tubing directly opposite of the secured end, step away until there is tension in the tubing or band. Hold onto a chair as needed for balance. 4. Lift your left / right leg out to your side. While you do this: ? Keep your back upright. ? Keep your shoulders over your hips. ? Keep your toes pointing forward. ? Make sure to use your hip muscles to lift your leg. Do not "throw" your leg or tip your body to lift your leg. 5. Hold this position for __________ seconds. 6. Slowly return to the starting position. Repeat __________ times. Complete this exercise __________ times a day. Exercise J: Squats (Quadriceps) 1. Stand in a door frame so your feet and knees are in line with the frame. You may place your hands on the frame for balance. 2. Slowly bend your knees and lower your hips like you are going to sit in a chair. ? Keep  your lower legs in a straight-up-and-down position. ? Do not let your hips go lower than your knees. ? Do not bend your knees lower than told by your health care provider. ? If your hip pain increases, do not bend as low. 3. Hold this position for ___________ seconds. 4. Slowly push with your legs to return to standing. Do not use your hands to pull yourself to standing. Repeat __________ times. Complete this exercise __________ times a day. This information is not intended to replace advice given to you by your health care provider. Make sure you discuss any questions you have with your health care provider. Document Released: 04/05/2005 Document Revised: 12/11/2015 Document Reviewed: 03/13/2015 Elsevier Interactive Patient Education  [employer].

## 2017-04-04 LAB — CBC WITH DIFFERENTIAL/PLATELET
Basophils Absolute: 39 cells/uL (ref 0–200)
Basophils Relative: 0.5 %
Eosinophils Absolute: 23 cells/uL (ref 15–500)
Eosinophils Relative: 0.3 %
HCT: 35 % (ref 35.0–45.0)
Hemoglobin: 11.5 g/dL — ABNORMAL LOW (ref 11.7–15.5)
Lymphs Abs: 1279 cells/uL (ref 850–3900)
MCH: 30.3 pg (ref 27.0–33.0)
MCHC: 32.9 g/dL (ref 32.0–36.0)
MCV: 92.3 fL (ref 80.0–100.0)
MPV: 10.3 fL (ref 7.5–12.5)
Monocytes Relative: 24.5 %
Neutro Abs: 4547 cells/uL (ref 1500–7800)
Neutrophils Relative %: 58.3 %
Platelets: 235 10*3/uL (ref 140–400)
RBC: 3.79 10*6/uL — ABNORMAL LOW (ref 3.80–5.10)
RDW: 14 % (ref 11.0–15.0)
Total Lymphocyte: 16.4 %
WBC mixed population: 1911 cells/uL — ABNORMAL HIGH (ref 200–950)
WBC: 7.8 10*3/uL (ref 3.8–10.8)

## 2017-04-04 LAB — COMPLETE METABOLIC PANEL WITH GFR
AG Ratio: 1.5 (calc) (ref 1.0–2.5)
ALT: 10 U/L (ref 6–29)
AST: 17 U/L (ref 10–35)
Albumin: 4.2 g/dL (ref 3.6–5.1)
Alkaline phosphatase (APISO): 58 U/L (ref 33–130)
BUN: 13 mg/dL (ref 7–25)
CO2: 30 mmol/L (ref 20–32)
Calcium: 9.8 mg/dL (ref 8.6–10.4)
Chloride: 106 mmol/L (ref 98–110)
Creat: 0.74 mg/dL (ref 0.60–0.88)
GFR, Est African American: 86 mL/min/{1.73_m2} (ref >=60)
GFR, Est Non African American: 74 mL/min/{1.73_m2} (ref >=60)
Globulin: 2.8 g/dL (calc) (ref 1.9–3.7)
Glucose, Bld: 250 mg/dL — ABNORMAL HIGH (ref 65–99)
Potassium: 5.3 mmol/L (ref 3.5–5.3)
Sodium: 142 mmol/L (ref 135–146)
Total Bilirubin: 0.8 mg/dL (ref 0.2–1.2)
Total Protein: 7 g/dL (ref 6.1–8.1)

## 2017-04-07 ENCOUNTER — Telehealth: Payer: Self-pay | Admitting: Physician Assistant

## 2017-04-07 NOTE — Telephone Encounter (Signed)
I tried to call the patient to notify her of her lab results.  There was no answer.  I forwarded her lab results to her PCP.  We will try calling her again tomorrow.

## 2017-04-07 NOTE — Progress Notes (Signed)
She has mild anemia.  We will continue to monitor. Her glucose is very elevated.  She does not have a history of diabetes.  Please advise her to follow up with her PCP.

## 2017-04-21 ENCOUNTER — Ambulatory Visit (INDEPENDENT_AMBULATORY_CARE_PROVIDER_SITE_OTHER): Payer: Medicare Other | Admitting: *Deleted

## 2017-04-21 DIAGNOSIS — I639 Cerebral infarction, unspecified: Secondary | ICD-10-CM

## 2017-04-21 NOTE — Progress Notes (Signed)
Carelink Summary Report / Loop Recorder 

## 2017-04-25 DIAGNOSIS — E039 Hypothyroidism, unspecified: Secondary | ICD-10-CM | POA: Diagnosis not present

## 2017-04-25 DIAGNOSIS — Z79899 Other long term (current) drug therapy: Secondary | ICD-10-CM | POA: Diagnosis not present

## 2017-04-25 DIAGNOSIS — E119 Type 2 diabetes mellitus without complications: Secondary | ICD-10-CM | POA: Diagnosis not present

## 2017-04-28 ENCOUNTER — Telehealth: Payer: Self-pay | Admitting: Neurology

## 2017-04-28 LAB — CUP PACEART REMOTE DEVICE CHECK
Date Time Interrogation Session: 20190120223821
Implantable Pulse Generator Implant Date: 20180525

## 2017-04-28 NOTE — Telephone Encounter (Signed)
Message sent to Dr. Sethi. 

## 2017-04-28 NOTE — Telephone Encounter (Signed)
Ok .whom is she seeing that day let them know to get me

## 2017-04-28 NOTE — Telephone Encounter (Addendum)
Pt's daughter called pt has appt on 1/31, it is the pt's birthday and she is wanting to know if Dr Leonie Man would come in and speak with her for just a moment. She said the pt really likes Dr Leonie Man and if he could just "pop in" for a moment she would really appreciate it. She is aware Dr Leonie Man has pt's that day also

## 2017-04-29 NOTE — Telephone Encounter (Signed)
Noted  

## 2017-04-30 NOTE — Progress Notes (Signed)
[facility]  PATIENT: Beverly Estrada DOB: [birth year]   REASON FOR VISIT: Follow-up for stroke HISTORY FROM: Patient and daughter    HISTORY OF PRESENT ILLNESS:Initial Consult  4/26/18PS ; Beverly Estrada is a 84 year pleasant Caucasian lady seen today for initial consultation visit. She is accompanied by her daughter and niece. History is up 10 from the patient, family and review of medical records provided by primary physician. I have personally reviewed her MRI images. She states she noticed sudden onset of memory difficulties 3 weeks ago. She has to stop in mid sentence and search for words. She's also had trouble remembering recent information having to write things down and she's always had a great memory. She denied any headache, slurred speech, gait or balance difficulties. She has had diminished vision in the right eye following what she calls a stroke affecting her INR year ago. She was seen by ophthalmologist at that time but did not have the records. She does not remember having a stroke workup at that time. She saw her primary physician ordered an MRI scan of the brain which have personally reviewed done on 07/16/16 shows a tiny diffusion positive infarct in the left midbrain centrally 12 peduncle as well as a subacute infarct in the left lentiform nucleus which shows postcontrast enhancement as well. Is mild age-related generalized cerebral atrophy and changes of small vessel disease. Patient states that her memory loss has persisted. Primary care physician also ordered a carotid ultrasound which I have reviewed which does not show significant extracranial stenosis. No vascular imaging study of the brain was obtained. Transthoracic echo has been ordered but not yet been completed. The patient denies any history suggestive of atrial fibrillation, palpitations, fainting episodes of syncope. She has no known cardiac problems. She was previously on aspirin and Plavix has been  added. She is tolerating these with minor bruising but no bleeding. She denies known vascular risk factors in the form of hypertension, hyperlipidemia or diabetes and did not have any recent lab work to see if lipid profile and hemoglobin A1c have been checked recently. Update 7/5/18PS ; She returns for follow-up after initial consultation 2 months ago. She is accompanied by her daughter. Patient is starting aspirin and Plavix but does complain of easy bruising. She had CTangiogram of the brain and neck done which I personally reviewed showed only mild at this cord changes without significant stenosis. She did undergo loop recorder insertion by Dr. Rayann Heman on 08/23/16  and so for A. fib has not been found. She remains on Lipitor which is tolerating well without muscle aches and pains. She convinced her mild short-term memory difficulties which appear to be unchanged. She is not participating in any regular    cognitively challenging activities. She does take fish oil every day. She has no new complaints today. UPDATE 1/31/2019CM Beverly Estrada, 84 year old female returns for follow-up with history of tiny diffusion positive infarct in the left midbrain centrally 12 peduncle as well as a subacute infarct in the left lentiform nucleus which shows postcontrast enhancement as well.  In April 2018 .  She is currently on Plavix for secondary stroke prevention without further stroke or TIA symptoms.  She has minimal bruising and no bleeding loop recorder was placed and so far no episodes of atrial fib.  She remains on Lipitor without myalgias She continues to complain of short-term memory problems, due to her retinal hemorrhage on the right and cataract on the left she has trouble with  cognitively challenging activities.  She says she continues to drive.  She continues to live alone but has friends to check on her frequently.  She returns for reevaluation without new complaints REVIEW OF SYSTEMS: Full 14 system review of  systems performed and notable only for those listed, all others are neg:  Constitutional: neg  Cardiovascular: neg Ear/Nose/Throat: neg  Skin: neg Eyes: Blurred vision retinal occlusion on the right Respiratory: neg Gastroitestinal: neg  Hematology/Lymphatic: Easy bruising Endocrine: neg Musculoskeletal: Joint pain Allergy/Immunology: neg Neurological: neg Psychiatric: Decreased concentration Sleep : neg   ALLERGIES: Allergies  Allergen Reactions  . Biaxin [Clarithromycin] Other (See Comments)    Insomnia, couldn't sleep because med tasted so bad   . Augmentin [Amoxicillin-Pot Clavulanate] Rash  . Clindamycin/Lincomycin Rash    HOME MEDICATIONS: Outpatient Medications Prior to Visit  Medication Sig Dispense Refill  . atorvastatin (LIPITOR) 40 MG tablet Take 40 mg by mouth daily.  5  . calcium-vitamin D 250-100 MG-UNIT tablet Take 1 tablet by mouth daily.     . clopidogrel (PLAVIX) 75 MG tablet Take 75 mg by mouth daily.  5  . folic acid (FOLVITE) 1 MG tablet Take 1 tablet (1 mg total) by mouth daily. 90 tablet 4  . levothyroxine (SYNTHROID, LEVOTHROID) 75 MCG tablet daily.  2  . methotrexate (RHEUMATREX) 2.5 MG tablet TAKE 3 TABLETS BY MOUTH EVERY WEEK 12 tablet 0  . Multiple Vitamin (MULTIVITAMIN) tablet Take 1 tablet by mouth daily.    . Omega-3 Fatty Acids (FISH OIL) 1000 MG CAPS Take 1,000 mg by mouth daily.     Marland Kitchen aspirin EC 325 MG tablet Take 325 mg by mouth daily.    Marland Kitchen levothyroxine (SYNTHROID, LEVOTHROID) 88 MCG tablet TAKE 1 TABLET BY MOUTH ON AN EMPTY STOMACH IN THE MORNING  2  . methotrexate (RHEUMATREX) 2.5 MG tablet TAKE 3 TABLETS BY MOUTH EVERY WEEK 36 tablet 0  . Tetrahydrozoline HCl (EYE DROPS OP) Place 1 drop into both eyes 3 (three) times daily as needed (dry eyes).      No facility-administered medications prior to visit.     PAST MEDICAL HISTORY: Past Medical History:  Diagnosis Date  . Arthritis   . DDD (degenerative disc disease), lumbar  01/30/2016  . Glaucoma   . Hypothyroidism   . Multiple drug allergies    to multiple antibiotics.   . Osteoarthritis of both feet 01/30/2016  . Osteoarthritis of both hands 01/30/2016  . Osteoarthritis of both knees 01/30/2016  . Osteoarthritis of right hand 01/30/2016   moderate  . Polymyalgia rheumatica ([place])   . RA (rheumatoid arthritis) ([place]) 01/30/2016   Sero Negative  . Skin cancer of nose   . Stroke [facility])    right eye     PAST SURGICAL HISTORY: Past Surgical History:  Procedure Laterality Date  . APPENDECTOMY    . CHOLECYSTECTOMY    . COLONOSCOPY  ~2013   pooyps removed, one of them large per pt. Polyp pathology unclear. no rescreening study planned due to age  . LOOP RECORDER INSERTION N/A 08/23/2016   Procedure: Loop Recorder Insertion;  Surgeon: Thompson Grayer, MD;  Location: [facility];  Service: Cardiovascular;  Laterality: N/A;  . TEE WITHOUT CARDIOVERSION N/A 08/23/2016   Procedure: TRANSESOPHAGEAL ECHOCARDIOGRAM (TEE);  Surgeon: Larey Dresser, MD;  Location: [facility]COPY;  Service: Cardiovascular;  Laterality: N/A;  . TONSIL SURGERY      FAMILY HISTORY: Family History  Problem Relation Age of Onset  . Diabetes Mellitus  II Mother   . Stroke Mother   . Diabetes Mellitus II Brother   . Stroke Brother   . Diabetes Brother   . Diabetes Sister   . Diabetes Sister   . Diabetes Sister   . Diabetes Sister   . Diabetes Brother   . Diabetes Brother   . Diabetes Brother   . Diabetes Brother   . Diabetes Brother     SOCIAL HISTORY: Social History   Socioeconomic History  . Marital status: Widowed    Spouse name: Not on file  . Number of children: Not on file  . Years of education: Not on file  . Highest education level: Not on file  Social Needs  . Financial resource strain: Not on file  . Food insecurity - worry: Not on file  . Food insecurity - inability: Not on file  . Transportation needs - medical: Not on file  . Transportation needs -  non-medical: Not on file  Occupational History  . Not on file  Tobacco Use  . Smoking status: Former Smoker    Years: 10.00    Types: Cigarettes    Last attempt to quit: 01/31/1970    Years since quitting: 47.2  . Smokeless tobacco: Never Used  Substance and Sexual Activity  . Alcohol use: No    Alcohol/week: 0.0 oz  . Drug use: No  . Sexual activity: Not on file  Other Topics Concern  . Not on file  Social History Narrative  . Not on file     PHYSICAL EXAM  Vitals:   05/01/17 1117  BP: 116/66  Pulse: 67  Weight: 148 lb 6.4 oz (67.3 kg)  Height: 5' (1.524 m)   Body mass index is 28.98 kg/m.  Generalized: Well developed, in no acute distress  Head: normocephalic and atraumatic,. Oropharynx benign  Neck: Supple, no carotid bruits  Cardiac: Regular rate rhythm, no murmur  Musculoskeletal: No deformity , mild kyphoscoliosis  Neurological examination   Mentation: Alert Clock drawing 4/4. AFT 5 MMSE - Mini Mental State Exam 05/01/2017  Orientation to time 4  Orientation to Place 3  Registration 3  Attention/ Calculation 5  Recall 1  Language- name 2 objects 2  Language- repeat 1  Language- follow 3 step command 3  Language- read & follow direction 1  Write a sentence 1  Copy design 1  Total score 25  .  Follows all commands speech and language fluent.   Cranial nerve II-XII: Pupils were equal round reactive to light extraocular movements were full, visual field were full on confrontational test.  Diminished visual acuity in the right eye . Facial sensation and strength were normal. Hard of hearing Uvula tongue midline. head turning and shoulder shrug were normal and symmetric.Tongue protrusion into cheek strength was normal. Motor: normal bulk and tone, full strength in the BUE, BLE, fine finger movements normal, no pronator drift. No focal weakness Sensory: normal and symmetric to light touch, pinprick, and  Vibration, in the upper and lower  extremities Coordination: finger-nose-finger, heel-to-shin bilaterally, no dysmetria, no tremor Reflexes: 1+ upper lower and symmetric plantar responses were flexor bilaterally. Gait and Station: Rising up from seated position without assistance, normal stance,  moderate stride, good arm swing, smooth turning, Unable to perform tiptoe, and heel walking without difficulty or tandem gait .  No assistive device  DIAGNOSTIC DATA (LABS, IMAGING, TESTING) - I reviewed patient records, labs, notes, testing and imaging myself where available.  Lab Results  Component Value Date  WBC 7.8 04/03/2017   HGB 11.5 (L) 04/03/2017   HCT 35.0 04/03/2017   MCV 92.3 04/03/2017   PLT 235 04/03/2017      Component Value Date/Time   NA 142 04/03/2017 1534   NA 141 12/26/2015   K 5.3 04/03/2017 1534   CL 106 04/03/2017 1534   CO2 30 04/03/2017 1534   GLUCOSE 250 (H) 04/03/2017 1534   BUN 13 04/03/2017 1534   BUN 16 12/26/2015   CREATININE 0.74 04/03/2017 1534   CALCIUM 9.8 04/03/2017 1534   PROT 7.0 04/03/2017 1534   ALBUMIN 4.2 10/08/2016 1537   AST 17 04/03/2017 1534   ALT 10 04/03/2017 1534   ALKPHOS 56 10/08/2016 1537   BILITOT 0.8 04/03/2017 1534   GFRNONAA 74 04/03/2017 1534   GFRAA 86 04/03/2017 1534     ASSESSMENT AND PLAN 84 year old lady with subacute memory loss possibly related to recent basal ganglia infarct as well as an acute clinically silent midbrain infarct as well. Remote history of right eye vision loss due to suspected retinal  artery occlusion.     PLAN:Stressed the importance of management of risk factors to prevent further stroke Continue Plavix for secondary stroke prevention Maintain strict control of hypertension with blood pressure goal below 130/90, today's reading 116/66 Control of diabetes with hemoglobin A1c below 6.5 followed by primary care  Cholesterol with LDL cholesterol less than 70, followed by primary care, continue Lipitor Exercise by walking,  ,  eat healthy diet with whole grains,  fresh fruits and vegetables Take fish oil for memory loss MMSE 25/30 She is unable to participate in cognitively challenging activities like solving crossword puzzles, sudoku and playing bridge due to her decreased vision Recommend due to visual symptoms she not drive until cleared by ophthalmologist Follow-up in 6 months and if remains stable discharge .I spent 25 minutes in total face to face time with the patient more than 50% of which was spent counseling and coordination of care, reviewing test results reviewing medications and discussing and reviewing the diagnosis of stroke and management of risk factors.  Patient was strongly encouraged not to drive due to her visual problems Dennie Bible, [facility], [facility], APRN  [facility] [address] [place], [place] [address] [phone]

## 2017-05-01 ENCOUNTER — Encounter: Payer: Self-pay | Admitting: Nurse Practitioner

## 2017-05-01 ENCOUNTER — Ambulatory Visit (INDEPENDENT_AMBULATORY_CARE_PROVIDER_SITE_OTHER): Payer: Medicare Other | Admitting: Nurse Practitioner

## 2017-05-01 ENCOUNTER — Other Ambulatory Visit: Payer: Self-pay | Admitting: *Deleted

## 2017-05-01 VITALS — BP 116/66 | HR 67 | Ht 60.0 in | Wt 148.4 lb

## 2017-05-01 DIAGNOSIS — Z8673 Personal history of transient ischemic attack (TIA), and cerebral infarction without residual deficits: Secondary | ICD-10-CM

## 2017-05-01 DIAGNOSIS — H5461 Unqualified visual loss, right eye, normal vision left eye: Secondary | ICD-10-CM

## 2017-05-01 DIAGNOSIS — E785 Hyperlipidemia, unspecified: Secondary | ICD-10-CM | POA: Diagnosis not present

## 2017-05-01 DIAGNOSIS — I639 Cerebral infarction, unspecified: Secondary | ICD-10-CM | POA: Diagnosis not present

## 2017-05-01 DIAGNOSIS — Z79899 Other long term (current) drug therapy: Secondary | ICD-10-CM

## 2017-05-01 MED ORDER — METHOTREXATE 2.5 MG PO TABS: ORAL_TABLET | ORAL | 0 refills | Status: DC

## 2017-05-01 NOTE — Patient Instructions (Addendum)
Stressed the importance of management of risk factors to prevent further stroke Continue Plavix for secondary stroke prevention Maintain strict control of hypertension with blood pressure goal below 130/90, today's reading 116/66 Control of diabetes with hemoglobin A1c below 6.5 followed by primary care  Cholesterol with LDL cholesterol less than 70, followed by primary care, continue Lipitor Exercise by walking,  , eat healthy diet with whole grains,  fresh fruits and vegetables Take fish oil for memory loss MMSE 25/30 Recommend due to visual symptoms she not drive until cleared by ophthalmologist Follow-up in 6 months and if remains stable discharge

## 2017-05-01 NOTE — Progress Notes (Signed)
I agree with the above plan 

## 2017-05-01 NOTE — Telephone Encounter (Signed)
Refill request received via fax  Last Visit: 04/03/17 Next visit: 09/09/17 Labs: 04/03/17 She has mild anemia. We will continue to monitor. Her glucose is very elevated  Okay to refill per Dr. Estanislado Pandy

## 2017-05-19 DIAGNOSIS — H34811 Central retinal vein occlusion, right eye, with macular edema: Secondary | ICD-10-CM | POA: Diagnosis not present

## 2017-05-19 DIAGNOSIS — H353132 Nonexudative age-related macular degeneration, bilateral, intermediate dry stage: Secondary | ICD-10-CM | POA: Diagnosis not present

## 2017-05-20 ENCOUNTER — Ambulatory Visit (INDEPENDENT_AMBULATORY_CARE_PROVIDER_SITE_OTHER): Payer: Medicare Other | Admitting: *Deleted

## 2017-05-20 DIAGNOSIS — I639 Cerebral infarction, unspecified: Secondary | ICD-10-CM

## 2017-05-22 NOTE — Progress Notes (Signed)
Carelink Summary Report / Loop Recorder 

## 2017-05-23 ENCOUNTER — Telehealth: Payer: Self-pay | Admitting: Rheumatology

## 2017-05-23 ENCOUNTER — Other Ambulatory Visit: Payer: Self-pay | Admitting: Rheumatology

## 2017-05-23 MED ORDER — METHOTREXATE 2.5 MG PO TABS: ORAL_TABLET | ORAL | 0 refills | Status: DC

## 2017-05-23 NOTE — Telephone Encounter (Signed)
Last Visit: 04/03/17 Next Visit: 09/09/17 Labs: 04/03/17 She has mild anemia. We will continue to monitor. Her glucose is very elevated  Okay to refill per Dr. Estanislado Pandy

## 2017-05-23 NOTE — Telephone Encounter (Signed)
Patient called requesting prescription refill of Methotrexate.  Patient states that she is out of pills.  [facility]'s pharmacy is [facility] [address] in [place].

## 2017-06-23 ENCOUNTER — Ambulatory Visit (INDEPENDENT_AMBULATORY_CARE_PROVIDER_SITE_OTHER): Payer: Medicare Other | Admitting: *Deleted

## 2017-06-23 DIAGNOSIS — I639 Cerebral infarction, unspecified: Secondary | ICD-10-CM | POA: Diagnosis not present

## 2017-06-23 LAB — CUP PACEART REMOTE DEVICE CHECK
Date Time Interrogation Session: 20190220234111
Implantable Pulse Generator Implant Date: 20180525

## 2017-06-24 NOTE — Progress Notes (Signed)
Carelink Summary Report / Loop Recorder 

## 2017-07-28 ENCOUNTER — Ambulatory Visit (INDEPENDENT_AMBULATORY_CARE_PROVIDER_SITE_OTHER): Payer: Medicare Other | Admitting: *Deleted

## 2017-07-28 DIAGNOSIS — I639 Cerebral infarction, unspecified: Secondary | ICD-10-CM | POA: Diagnosis not present

## 2017-07-28 NOTE — Progress Notes (Signed)
Carelink Summary Report / Loop Recorder 

## 2017-08-02 LAB — CUP PACEART REMOTE DEVICE CHECK
Date Time Interrogation Session: 20190326000647
Implantable Pulse Generator Implant Date: 20180525

## 2017-08-07 ENCOUNTER — Other Ambulatory Visit: Payer: Self-pay | Admitting: *Deleted

## 2017-08-07 MED ORDER — FOLIC ACID 1 MG PO TABS: 1.0000 mg | ORAL_TABLET | Freq: Every day | ORAL | 4 refills | Status: DC

## 2017-08-07 NOTE — Telephone Encounter (Signed)
Refill request received via fax  Last Visit: 04/03/17 Next visit: 09/09/17  Okay to refill per Dr. Estanislado Pandy

## 2017-08-10 ENCOUNTER — Other Ambulatory Visit: Payer: Self-pay | Admitting: Rheumatology

## 2017-08-11 ENCOUNTER — Telehealth: Payer: Self-pay | Admitting: Rheumatology

## 2017-08-11 DIAGNOSIS — Z79899 Other long term (current) drug therapy: Secondary | ICD-10-CM

## 2017-08-11 NOTE — Telephone Encounter (Addendum)
Last Visit: 04/03/17 Next visit: 09/09/17 Labs: 04/03/17 mild anemia glucose is very elevated  Patient's daughter advised patient is due for labs. Patient's daughter will have her update this week.   Okay to refill 30 day supply this week.

## 2017-08-11 NOTE — Telephone Encounter (Signed)
Lab orders faxed.

## 2017-08-11 NOTE — Telephone Encounter (Signed)
Patient wants to go to [place] office. Fax# [phone]. Please fax over lab orders today. Patient going to have labs done tomorrow, 08/12/17.

## 2017-08-12 DIAGNOSIS — M353 Polymyalgia rheumatica: Secondary | ICD-10-CM | POA: Diagnosis not present

## 2017-08-12 DIAGNOSIS — F321 Major depressive disorder, single episode, moderate: Secondary | ICD-10-CM | POA: Diagnosis not present

## 2017-08-12 DIAGNOSIS — E039 Hypothyroidism, unspecified: Secondary | ICD-10-CM | POA: Diagnosis not present

## 2017-08-12 DIAGNOSIS — E119 Type 2 diabetes mellitus without complications: Secondary | ICD-10-CM | POA: Diagnosis not present

## 2017-08-12 NOTE — Telephone Encounter (Signed)
Please refax lab orders to GP office, and add note patient coming for lab work to your office. Patient went, but did not know why she was there for appt. Patient was confused, and did not know what to tell them . Daughter very frustrated with GP office right now.

## 2017-08-13 NOTE — Telephone Encounter (Signed)
Labs orders re-faxed

## 2017-08-14 DIAGNOSIS — Z79899 Other long term (current) drug therapy: Secondary | ICD-10-CM | POA: Diagnosis not present

## 2017-08-18 DIAGNOSIS — H353132 Nonexudative age-related macular degeneration, bilateral, intermediate dry stage: Secondary | ICD-10-CM | POA: Diagnosis not present

## 2017-08-20 LAB — CUP PACEART REMOTE DEVICE CHECK
Date Time Interrogation Session: 20190428001150
Implantable Pulse Generator Implant Date: 20180525

## 2017-08-28 ENCOUNTER — Ambulatory Visit (INDEPENDENT_AMBULATORY_CARE_PROVIDER_SITE_OTHER): Payer: Medicare Other | Admitting: *Deleted

## 2017-08-28 DIAGNOSIS — I639 Cerebral infarction, unspecified: Secondary | ICD-10-CM | POA: Diagnosis not present

## 2017-08-28 NOTE — Progress Notes (Signed)
Office Visit Note  Patient: Beverly Estrada             Date of Birth: 1930-02-17           MRN: [id number]             PCP: Ernestene Kiel, MD Referring: Ernestene Kiel, MD Visit Date: 09/09/2017 Occupation: @GUAROCC @    Subjective:  Lower back pain.   History of Present Illness: Beverly Estrada is a 84 y.o. female with history of rheumatoid arthritis, osteoarthritis, DDD and PMR.  According to patient she is not having any joint swelling.  She has been tolerating methotrexate well.  There has been no relapse of PMR.  She has been having lower back discomfort for several months.  She states the pain does not radiate anywhere and is localized to her lower back.  She has difficulty standing.  On sitting her symptoms do get better.  Activities of Daily Living:  Patient reports morning stiffness for 10 minutes.   Patient Denies nocturnal pain.  Difficulty dressing/grooming: Denies Difficulty climbing stairs: Reports Difficulty getting out of chair: Reports Difficulty using hands for taps, buttons, cutlery, and/or writing: Denies   Review of Systems  Constitutional: Positive for fatigue. Negative for night sweats, weight gain and weight loss.  HENT: Negative for mouth sores, trouble swallowing, trouble swallowing, mouth dryness and nose dryness.   Eyes: Positive for dryness. Negative for pain, redness and visual disturbance.  Respiratory: Negative for cough, shortness of breath and difficulty breathing.   Cardiovascular: Negative for chest pain, palpitations, hypertension, irregular heartbeat and swelling in legs/feet.  Gastrointestinal: Negative for blood in stool, constipation and diarrhea.  Endocrine: Negative for increased urination.  Genitourinary: Negative for vaginal dryness.  Musculoskeletal: Positive for arthralgias, joint pain and morning stiffness. Negative for joint swelling, myalgias, muscle weakness, muscle tenderness and myalgias.  Skin: Negative for color  change, rash, hair loss, skin tightness, ulcers and sensitivity to sunlight.  Allergic/Immunologic: Negative for susceptible to infections.  Neurological: Negative for dizziness, memory loss, night sweats and weakness.  Hematological: Negative for swollen glands.  Psychiatric/Behavioral: Negative for depressed mood and sleep disturbance. The patient is not nervous/anxious.     PMFS History:  Patient Active Problem List   Diagnosis Date Noted  . History of stroke 05/01/2017  . Hyperlipidemia 05/01/2017  . Vision loss of right eye 10/07/2016  . Medication monitoring encounter 07/02/2016  . Trochanteric bursitis of both hips 07/02/2016  . High risk medication use 07/02/2016  . Osteopenia 02/01/2016  . RA (rheumatoid arthritis) ([place]) 01/30/2016  . Osteoarthritis of both knees 01/30/2016  . Osteoarthritis of both feet 01/30/2016  . DDD (degenerative disc disease), lumbar 01/30/2016  . Primary osteoarthritis of right hip 01/30/2016  . Depression 01/30/2016  . Choledocholithiasis 03/13/2015  . Hypothyroidism 03/13/2015  . Polymyalgia rheumatica ([place]) 03/13/2015    Past Medical History:  Diagnosis Date  . Arthritis   . DDD (degenerative disc disease), lumbar 01/30/2016  . Glaucoma   . Hypothyroidism   . Multiple drug allergies    to multiple antibiotics.   . Osteoarthritis of both feet 01/30/2016  . Osteoarthritis of both hands 01/30/2016  . Osteoarthritis of both knees 01/30/2016  . Osteoarthritis of right hand 01/30/2016   moderate  . Polymyalgia rheumatica ([place])   . RA (rheumatoid arthritis) ([place]) 01/30/2016   Sero Negative  . Skin cancer of nose   . Stroke [facility])    right eye     Family History  Problem  Relation Age of Onset  . Diabetes Mellitus II Mother   . Stroke Mother   . Diabetes Mellitus II Brother   . Stroke Brother   . Diabetes Brother   . Diabetes Sister   . Diabetes Sister   . Diabetes Sister   . Diabetes Sister   . Diabetes Brother   . Diabetes  Brother   . Diabetes Brother   . Diabetes Brother   . Diabetes Brother    Past Surgical History:  Procedure Laterality Date  . APPENDECTOMY    . CHOLECYSTECTOMY    . COLONOSCOPY  ~2013   pooyps removed, one of them large per pt. Polyp pathology unclear. no rescreening study planned due to age  . LOOP RECORDER INSERTION N/A 08/23/2016   Procedure: Loop Recorder Insertion;  Surgeon: Thompson Grayer, MD;  Location: [place] LAB;  Service: Cardiovascular;  Laterality: N/A;  . TEE WITHOUT CARDIOVERSION N/A 08/23/2016   Procedure: TRANSESOPHAGEAL ECHOCARDIOGRAM (TEE);  Surgeon: Larey Dresser, MD;  Location: [facility] ENDOSCOPY;  Service: Cardiovascular;  Laterality: N/A;  . TONSIL SURGERY     Social History   Social History Narrative  . Not on file     Objective: Vital Signs: BP 123/66 (BP Location: Left Arm, Patient Position: Sitting, Cuff Size: Normal)   Pulse (!) 56   Resp 15   Ht 5\' 5"  (1.651 m)   Wt 148 lb (67.1 kg)   BMI 24.63 kg/m    Physical Exam  Constitutional: She is oriented to person, place, and time. She appears well-developed and well-nourished.  HENT:  Head: Normocephalic and atraumatic.  Eyes: Conjunctivae and EOM are normal.  Neck: Normal range of motion.  Cardiovascular: Normal rate, regular rhythm, normal heart sounds and intact distal pulses.  Pulmonary/Chest: Effort normal and breath sounds normal.  Abdominal: Soft. Bowel sounds are normal.  Lymphadenopathy:    She has no cervical adenopathy.  Neurological: She is alert and oriented to person, place, and time.  Skin: Skin is warm and dry. Capillary refill takes less than 2 seconds.  Psychiatric: She has a normal mood and affect. Her behavior is normal.  Nursing note and vitals reviewed.    Musculoskeletal Exam: Spine good range of motion.  She has thoracic kyphosis.  She has limited range of motion with discomfort in her lower back.  Shoulder joints and elbow joints are good range of motion.  She has  DIP PIP thickening in her hands.  No synovitis was noted over MCP joints.  Hip joints and limited range of motion.  Knee joints were in good range of motion with some crepitus.  She has some osteoarthritic changes in her feet without synovitis.  CDAI Exam: CDAI Homunculus Exam:   Joint Counts:  CDAI Tender Joint count: 0 CDAI Swollen Joint count: 0  Global Assessments:  Patient Global Assessment: 2 Provider Global Assessment: 2  CDAI Calculated Score: 4    Investigation: No additional findings. CBC Latest Ref Rng & Units 04/03/2017 10/08/2016 07/04/2016  WBC 3.8 - 10.8 Thousand/uL 7.8 7.0 6.4  Hemoglobin 11.7 - 15.5 g/dL 11.5(L) 11.7 13.1  Hematocrit 35.0 - 45.0 % 35.0 36.6 39.2  Platelets 140 - 400 Thousand/uL 235 200 288   CMP Latest Ref Rng & Units 04/03/2017 10/08/2016 07/04/2016  Glucose 65 - 99 mg/dL 250(H) 139(H) 100(H)  BUN 7 - 25 mg/dL 13 16 15   Creatinine 0.60 - 0.88 mg/dL 0.74 0.64 1.02(H)  Sodium 135 - 146 mmol/L 142 139 139  Potassium 3.5 -  5.3 mmol/L 5.3 4.4 4.7  Chloride 98 - 110 mmol/L 106 105 102  CO2 20 - 32 mmol/L 30 22 26   Calcium 8.6 - 10.4 mg/dL 9.8 9.4 10.1  Total Protein 6.1 - 8.1 g/dL 7.0 6.9 7.7  Total Bilirubin 0.2 - 1.2 mg/dL 0.8 0.8 0.6  Alkaline Phos 33 - 130 U/L - 56 60  AST 10 - 35 U/L 17 23 18   ALT 6 - 29 U/L 10 13 8     Imaging: No results found.  Speciality Comments: No specialty comments available.    Procedures:  No procedures performed Allergies: Biaxin [clarithromycin]; Augmentin [amoxicillin-pot clavulanate]; and Clindamycin/lincomycin   Assessment / Plan:     Visit Diagnoses: Rheumatoid arthritis of multiple sites with negative rheumatoid factor (HCC)-patient has no synovitis on examination she appears to be doing well on low-dose methotrexate.  Her labs are up-to-date.  High risk medication use - Methotrexate 3 tablets by mouth every week, folic acid 1 mg by mouth daily.  We will continue to refill her medications and check her  labs every 3 months.  Chronic midline low back pain without sciatica -she is been experiencing a lot of pain and discomfort in her lower back.  She states that standing is difficult for her.  Pain is better when she is sitting.  Plan: XR Lumbar Spine 2-3 Views.  The lumbar spine x-rays show scoliosis and multilevel spondylosis.  She is having severe pain and discomfort in the lower back.  She has seen Dr. Ernestina Patches in the past.  I will refer her to Dr. Ernestina Patches again.  Trigger index finger of left hand-she has left second trigger finger.  I will refer her to orthopedics for evaluation and treatment.  Polymyalgia rheumatica (HCC)-in remission.  Primary osteoarthritis of right hip-she has limited range of motion.  Primary osteoarthritis of both knees-she is crepitus in her bilateral knee joints without any warmth swelling or effusion.  Primary osteoarthritis of both feet  DDD (degenerative disc disease), lumbar - She has seen Dr. Ernestina Patches in the past for injections.  Osteopenia of multiple sites - DXA is  done by her PCP.  History of hypothyroidism  History of stroke - With subacute memory loss followed up by Dr. Leonie Man.  Vision loss of right eye  History of depression    Orders: Orders Placed This Encounter  Procedures  . XR Lumbar Spine 2-3 Views   No orders of the defined types were placed in this encounter.   Face-to-face time spent with patient was 30 minutes. >50% of time was spent in counseling and coordination of care.  Follow-Up Instructions: Return in about 5 months (around 02/09/2018) for Rheumatoid arthritis, Osteoarthritis,DDD.   Bo Merino, MD  Note - This record has been created using Editor, commissioning.  Chart creation errors have been sought, but may not always  have been located. Such creation errors do not reflect on  the standard of medical care.

## 2017-08-29 NOTE — Progress Notes (Signed)
Carelink Summary Report / Loop Recorder 

## 2017-09-03 ENCOUNTER — Other Ambulatory Visit: Payer: Self-pay | Admitting: Rheumatology

## 2017-09-07 ENCOUNTER — Other Ambulatory Visit: Payer: Self-pay | Admitting: Rheumatology

## 2017-09-08 NOTE — Telephone Encounter (Addendum)
Last Visit: 04/03/17 Next visit: 09/09/17 Labs: 08/14/17 Elevated glucose   Okay to refill per Dr. Estanislado Pandy

## 2017-09-09 ENCOUNTER — Ambulatory Visit (INDEPENDENT_AMBULATORY_CARE_PROVIDER_SITE_OTHER): Payer: Medicare Other | Admitting: Rheumatology

## 2017-09-09 ENCOUNTER — Ambulatory Visit (INDEPENDENT_AMBULATORY_CARE_PROVIDER_SITE_OTHER): Payer: Medicare Other

## 2017-09-09 ENCOUNTER — Encounter: Payer: Self-pay | Admitting: Rheumatology

## 2017-09-09 VITALS — BP 123/66 | HR 56 | Resp 15 | Ht 65.0 in | Wt 148.0 lb

## 2017-09-09 DIAGNOSIS — H5461 Unqualified visual loss, right eye, normal vision left eye: Secondary | ICD-10-CM

## 2017-09-09 DIAGNOSIS — M19071 Primary osteoarthritis, right ankle and foot: Secondary | ICD-10-CM | POA: Diagnosis not present

## 2017-09-09 DIAGNOSIS — I639 Cerebral infarction, unspecified: Secondary | ICD-10-CM | POA: Diagnosis not present

## 2017-09-09 DIAGNOSIS — M353 Polymyalgia rheumatica: Secondary | ICD-10-CM

## 2017-09-09 DIAGNOSIS — M0609 Rheumatoid arthritis without rheumatoid factor, multiple sites: Secondary | ICD-10-CM

## 2017-09-09 DIAGNOSIS — Z8673 Personal history of transient ischemic attack (TIA), and cerebral infarction without residual deficits: Secondary | ICD-10-CM | POA: Diagnosis not present

## 2017-09-09 DIAGNOSIS — M545 Low back pain, unspecified: Secondary | ICD-10-CM

## 2017-09-09 DIAGNOSIS — M5136 Other intervertebral disc degeneration, lumbar region: Secondary | ICD-10-CM

## 2017-09-09 DIAGNOSIS — Z8659 Personal history of other mental and behavioral disorders: Secondary | ICD-10-CM

## 2017-09-09 DIAGNOSIS — Z79899 Other long term (current) drug therapy: Secondary | ICD-10-CM

## 2017-09-09 DIAGNOSIS — Z8639 Personal history of other endocrine, nutritional and metabolic disease: Secondary | ICD-10-CM

## 2017-09-09 DIAGNOSIS — M170 Bilateral primary osteoarthritis of knee: Secondary | ICD-10-CM | POA: Diagnosis not present

## 2017-09-09 DIAGNOSIS — G8929 Other chronic pain: Secondary | ICD-10-CM

## 2017-09-09 DIAGNOSIS — M65322 Trigger finger, left index finger: Secondary | ICD-10-CM

## 2017-09-09 DIAGNOSIS — M19072 Primary osteoarthritis, left ankle and foot: Secondary | ICD-10-CM

## 2017-09-09 DIAGNOSIS — M8589 Other specified disorders of bone density and structure, multiple sites: Secondary | ICD-10-CM

## 2017-09-09 DIAGNOSIS — M1611 Unilateral primary osteoarthritis, right hip: Secondary | ICD-10-CM

## 2017-09-09 NOTE — Patient Instructions (Addendum)
Please call Dr. Romona Curls office to schedule an appointment for your back. [phone]  Standing Labs We placed an order today for your standing lab work.    Please come back and get your standing labs in August and every 3 months  We have open lab Monday through Friday from 8:30-11:30 AM and 1:30-4:00 PM  at the office of Dr. Bo Merino.   You may experience shorter wait times on Monday and Friday afternoons. The office is located at [address] [place], [place], [place] [address] No appointment is necessary.   Labs are drawn by [facility].  You may receive a bill from [facility] for your lab work. If you have any questions regarding directions or hours of operation,  please call [phone].

## 2017-09-11 ENCOUNTER — Telehealth (INDEPENDENT_AMBULATORY_CARE_PROVIDER_SITE_OTHER): Payer: Self-pay | Admitting: Physical Medicine and Rehabilitation

## 2017-09-11 NOTE — Telephone Encounter (Signed)
If right hip and groin then ok for IA hip injection, if just back pain and no leg pain ok for facet block (L4-5) if back,hip and leg etc then OV

## 2017-09-12 NOTE — Telephone Encounter (Signed)
Left messages on mobile (daughter Kasandra Knudsen who is listed on patient's DPR) and on home phone for patient to call back to discuss/ schedule.

## 2017-09-12 NOTE — Telephone Encounter (Signed)
Scheduled for 10/06/17 at 1545 with driver.

## 2017-09-16 ENCOUNTER — Ambulatory Visit (INDEPENDENT_AMBULATORY_CARE_PROVIDER_SITE_OTHER): Payer: Medicare Other | Admitting: Orthopaedic Surgery

## 2017-09-16 DIAGNOSIS — M65322 Trigger finger, left index finger: Secondary | ICD-10-CM | POA: Diagnosis not present

## 2017-09-16 NOTE — Progress Notes (Signed)
Office Visit Note   Patient: Beverly Estrada           Date of Birth: Sep 20, 1929           MRN: [id number] Visit Date: 09/16/2017              Requested by: Ernestene Kiel, MD [address] [place], [place] [address] PCP: Ernestene Kiel, MD   Assessment & Plan: Visit Diagnoses:  1. Trigger index finger of left hand     Plan: Impression is left trigger index finger.  We will inject this with cortisone today.  She will follow-up with [place] as needed.  Call with concerns or questions in the meantime.  Follow-Up Instructions: Return if symptoms worsen or fail to improve.   Orders:  Orders Placed This Encounter  Procedures  . Hand/UE Inj   No orders of the defined types were placed in this encounter.     Procedures: Hand/UE Inj: L index A1 for trigger finger on 09/16/2017 2:07 PM Indications: pain Details: 25 G needle Medications: 1 mL lidocaine 1 %; 0.33 mL bupivacaine 0.25 %; 13.33 mg methylPREDNISolone acetate 40 MG/ML      Clinical Data: No additional findings.   Subjective: Chief Complaint  Patient presents with  . Left Index Finger - Pain    Finger gets stuck in flexed position    HPI patient is a pleasant 84 year old female who presents to our clinic today with triggering to the left index finger.  She noticed this a few weeks ago.  It has not worsened but she has not noticed any improvement of symptoms.  Pain and triggering are worse in the morning.  No previous history of trigger finger, although she does have a history of rheumatoid arthritis and is seen by Dr. Estanislado Pandy.  Review of Systems as detailed in HPI.  All others are negative.   Objective: Vital Signs: There were no vitals taken for this visit.  Physical Exam well-developed well-nourished female in no acute distress.  Alert and oriented x3.  Ortho Exam examination of her left index finger shows noticeable triggering.  She does have a tender nodule over the A1 pulley.  She is neurovascularly  intact distally.  Specialty Comments:  No specialty comments available.  Imaging: No new imaging today   PMFS History: Patient Active Problem List   Diagnosis Date Noted  . Trigger index finger of left hand 09/16/2017  . History of stroke 05/01/2017  . Hyperlipidemia 05/01/2017  . Vision loss of right eye 10/07/2016  . Medication monitoring encounter 07/02/2016  . Trochanteric bursitis of both hips 07/02/2016  . High risk medication use 07/02/2016  . Osteopenia 02/01/2016  . RA (rheumatoid arthritis) ([place]) 01/30/2016  . Osteoarthritis of both knees 01/30/2016  . Osteoarthritis of both feet 01/30/2016  . DDD (degenerative disc disease), lumbar 01/30/2016  . Primary osteoarthritis of right hip 01/30/2016  . Depression 01/30/2016  . Choledocholithiasis 03/13/2015  . Hypothyroidism 03/13/2015  . Polymyalgia rheumatica ([place]) 03/13/2015   Past Medical History:  Diagnosis Date  . Arthritis   . DDD (degenerative disc disease), lumbar 01/30/2016  . Glaucoma   . Hypothyroidism   . Multiple drug allergies    to multiple antibiotics.   . Osteoarthritis of both feet 01/30/2016  . Osteoarthritis of both hands 01/30/2016  . Osteoarthritis of both knees 01/30/2016  . Osteoarthritis of right hand 01/30/2016   moderate  . Polymyalgia rheumatica ([place])   . RA (rheumatoid arthritis) ([place]) 01/30/2016   Sero  Negative  . Skin cancer of nose   . Stroke [facility])    right eye     Family History  Problem Relation Age of Onset  . Diabetes Mellitus II Mother   . Stroke Mother   . Diabetes Mellitus II Brother   . Stroke Brother   . Diabetes Brother   . Diabetes Sister   . Diabetes Sister   . Diabetes Sister   . Diabetes Sister   . Diabetes Brother   . Diabetes Brother   . Diabetes Brother   . Diabetes Brother   . Diabetes Brother     Past Surgical History:  Procedure Laterality Date  . APPENDECTOMY    . CHOLECYSTECTOMY    . COLONOSCOPY  ~2013   pooyps removed, one of them large  per pt. Polyp pathology unclear. no rescreening study planned due to age  . LOOP RECORDER INSERTION N/A 08/23/2016   Procedure: Loop Recorder Insertion;  Surgeon: Thompson Grayer, MD;  Location: [facility] CV LAB;  Service: Cardiovascular;  Laterality: N/A;  . TEE WITHOUT CARDIOVERSION N/A 08/23/2016   Procedure: TRANSESOPHAGEAL ECHOCARDIOGRAM (TEE);  Surgeon: Larey Dresser, MD;  Location: [facility] ENDOSCOPY;  Service: Cardiovascular;  Laterality: N/A;  . TONSIL SURGERY     Social History   Occupational History  . Not on file  Tobacco Use  . Smoking status: Former Smoker    Years: 10.00    Types: Cigarettes    Last attempt to quit: 01/31/1970    Years since quitting: 47.6  . Smokeless tobacco: Never Used  Substance and Sexual Activity  . Alcohol use: No    Alcohol/week: 0.0 oz  . Drug use: Never  . Sexual activity: Not on file

## 2017-09-17 MED ORDER — LIDOCAINE HCL 1 % IJ SOLN
1.0000 mL | INTRAMUSCULAR | Status: AC | PRN
  Administered 2017-09-16: 1 mL

## 2017-09-17 MED ORDER — BUPIVACAINE HCL 0.25 % IJ SOLN
0.3300 mL | INTRAMUSCULAR | Status: AC | PRN
  Administered 2017-09-16: .33 mL

## 2017-09-17 MED ORDER — METHYLPREDNISOLONE ACETATE 40 MG/ML IJ SUSP
13.3300 mg | INTRAMUSCULAR | Status: AC | PRN
  Administered 2017-09-16: 13.33 mg

## 2017-09-22 LAB — CUP PACEART REMOTE DEVICE CHECK
Date Time Interrogation Session: 20190531003843
Implantable Pulse Generator Implant Date: 20180525

## 2017-09-30 ENCOUNTER — Ambulatory Visit (INDEPENDENT_AMBULATORY_CARE_PROVIDER_SITE_OTHER): Payer: Medicare Other | Admitting: *Deleted

## 2017-09-30 DIAGNOSIS — I639 Cerebral infarction, unspecified: Secondary | ICD-10-CM

## 2017-10-01 NOTE — Progress Notes (Signed)
Carelink Summary Report / Loop Recorder 

## 2017-10-06 ENCOUNTER — Ambulatory Visit (INDEPENDENT_AMBULATORY_CARE_PROVIDER_SITE_OTHER): Payer: Self-pay

## 2017-10-06 ENCOUNTER — Ambulatory Visit (INDEPENDENT_AMBULATORY_CARE_PROVIDER_SITE_OTHER): Payer: Medicare Other | Admitting: Physical Medicine and Rehabilitation

## 2017-10-06 ENCOUNTER — Encounter (INDEPENDENT_AMBULATORY_CARE_PROVIDER_SITE_OTHER): Payer: Self-pay | Admitting: Physical Medicine and Rehabilitation

## 2017-10-06 VITALS — BP 125/54 | HR 66

## 2017-10-06 DIAGNOSIS — M0609 Rheumatoid arthritis without rheumatoid factor, multiple sites: Secondary | ICD-10-CM | POA: Diagnosis not present

## 2017-10-06 DIAGNOSIS — M47816 Spondylosis without myelopathy or radiculopathy, lumbar region: Secondary | ICD-10-CM | POA: Diagnosis not present

## 2017-10-06 DIAGNOSIS — G894 Chronic pain syndrome: Secondary | ICD-10-CM | POA: Diagnosis not present

## 2017-10-06 DIAGNOSIS — M419 Scoliosis, unspecified: Secondary | ICD-10-CM

## 2017-10-06 DIAGNOSIS — I639 Cerebral infarction, unspecified: Secondary | ICD-10-CM

## 2017-10-06 MED ORDER — METHYLPREDNISOLONE ACETATE 80 MG/ML IJ SUSP: 80.0000 mg | Freq: Once | INTRAMUSCULAR | Status: DC

## 2017-10-06 NOTE — Patient Instructions (Signed)

## 2017-10-06 NOTE — Progress Notes (Signed)
 .  Numeric Pain Rating Scale and Functional Assessment Average Pain 7   In the last MONTH (on 0-10 scale) has pain interfered with the following?  1. General activity like being  able to carry out your everyday physical activities such as walking, climbing stairs, carrying groceries, or moving a chair?  Rating(5)   +Driver, -BT, -Dye Allergies.  

## 2017-10-16 NOTE — Procedures (Signed)
Lumbar Facet Joint Intra-Articular Injection(s) with Fluoroscopic Guidance  Patient: Beverly Estrada      Date of Birth: Oct 03, 1929 MRN: [id number] PCP: Ernestene Kiel, MD      Visit Date: 10/06/2017   Universal Protocol:    Date/Time: 10/06/2017  Consent Given By: the patient  Position: PRONE   Additional Comments: Vital signs were monitored before and after the procedure. Patient was prepped and draped in the usual sterile fashion. The correct patient, procedure, and site was verified.   Injection Procedure Details:  Procedure Site One Meds Administered:  Meds ordered this encounter  Medications  . methylPREDNISolone acetate (DEPO-MEDROL) injection 80 mg     Laterality: Bilateral  Location/Site:  L4-L5  Needle size: 22 guage  Needle type: Spinal  Needle Placement: Articular  Findings:  -Comments: Excellent flow of contrast producing a partial arthrogram.  Procedure Details: The fluoroscope beam is vertically oriented in AP, and the inferior recess is visualized beneath the lower pole of the inferior apophyseal process, which represents the target point for needle insertion. When direct visualization is difficult the target point is located at the medial projection of the vertebral pedicle. The region overlying each aforementioned target is locally anesthetized with a 1 to 2 ml. volume of 1% Lidocaine without Epinephrine.   The spinal needle was inserted into each of the above mentioned facet joints using biplanar fluoroscopic guidance. A 0.25 to 0.5 ml. volume of Isovue-250 was injected and a partial facet joint arthrogram was obtained. A single spot film was obtained of the resulting arthrogram.    One to 1.25 ml of the steroid/anesthetic solution was then injected into each of the facet joints noted above.   Additional Comments:  The patient tolerated the procedure well Dressing: Band-Aid    Post-procedure details: Patient was observed during the  procedure. Post-procedure instructions were reviewed.  Patient left the clinic in stable condition.

## 2017-10-16 NOTE — Progress Notes (Signed)
Beverly Estrada - 83 y.o. female MRN [id number]  Date of birth: [birth year]  Office Visit Note: Visit Date: 10/06/2017 PCP: Ernestene Kiel, MD Referred by: Ernestene Kiel, MD  Subjective: Chief Complaint  Patient presents with  . Lower Back - Pain   HPI: Beverly Estrada is an 84 year old female who I last saw in 2015 and completed a right intra-articular hip injection with good relief of right hip and groin pain at the time.  We had seen her for a couple of years for chronic back pain with radicular pain sometimes left and sometimes right.  MRI is reviewed again below from 2014.  She has had x-rays by Dr. Estanislado Pandy who follows her for a history of rheumatoid arthritis and polymyalgia rheumatica.  These x-rays were from 08/2017 and show again scoliosis and multilevel facet arthropathy and degenerative disc height loss.  Today she was reporting many months of chronic worsening axial back pain without any radicular complaints and no groin pain.  She says is worse first thing in the morning getting out of bed and standing and walking.  She reports nothing really helps her pain much at all.  She is somewhat of a poor historian at times in terms of her pain she has not had multiple issues with arthritis.  Medication management through Dr. Estanislado Pandy has not really helped very much.  She has had no focal weakness or trauma.  She has had no bowel or bladder issues.  She has not really had any red flag complaints.  She has multiple joint pain in multiple areas of pain in general.   Review of Systems  Constitutional: Positive for malaise/fatigue. Negative for chills, fever and weight loss.  HENT: Negative for hearing loss and sinus pain.   Eyes: Negative for blurred vision, double vision and photophobia.  Respiratory: Negative for cough and shortness of breath.   Cardiovascular: Negative for chest pain, palpitations and leg swelling.  Gastrointestinal: Negative for abdominal pain, nausea and  vomiting.  Genitourinary: Negative for flank pain.  Musculoskeletal: Positive for back pain and joint pain. Negative for myalgias.  Skin: Negative for itching and rash.  Neurological: Negative for tingling, tremors, focal weakness and weakness.  Endo/Heme/Allergies: Negative.   Psychiatric/Behavioral: Negative for depression.  All other systems reviewed and are negative.  Otherwise per HPI.  Assessment & Plan: Visit Diagnoses:  1. Spondylosis without myelopathy or radiculopathy, lumbar region   2. Rheumatoid arthritis of multiple sites with negative rheumatoid factor (HCC)   3. Scoliosis of lumbosacral spine, unspecified scoliosis type   4. Chronic pain syndrome     Plan: Findings:  Chronic worsening severe at times axial low back pain worse in the morning and standing.  Exam is consistent with facet mediated low back pain.  She does have significant scoliosis and does have some myofascial pain and pain with palpation along the musculature.  She has no hip pain but does have stiffness with rotation of the right hip.  I think her pain is mainly facet mediated pain.  It seems to be over the L4-5 region.  I think the best approach because of the fact that she just really has not gotten much relief with medicines or time or activity reduction, is to complete diagnostic facet joint blocks at L4-5.  Depending on relief could ultimately look at radiofrequency ablation.  If she does not get much relief then regrouping with a physical therapist for a short course of possibly manual treatment and may be even dry needling.  Not sure she would tolerate the dry needling but at least its an option.  If she gets no relief in the symptoms are still ongoing I would update her MRI at that time.    Meds & Orders:  Meds ordered this encounter  Medications  . methylPREDNISolone acetate (DEPO-MEDROL) injection 80 mg    Orders Placed This Encounter  Procedures  . Facet Injection  . XR C-ARM NO REPORT      Follow-up: Return if symptoms worsen or fail to improve.   Procedures: No procedures performed  Lumbar Facet Joint Intra-Articular Injection(s) with Fluoroscopic Guidance  Patient: Beverly Estrada      Date of Birth: 05/07/1929 MRN: [id number] PCP: Ernestene Kiel, MD      Visit Date: 10/06/2017   Universal Protocol:    Date/Time: 10/06/2017  Consent Given By: the patient  Position: PRONE   Additional Comments: Vital signs were monitored before and after the procedure. Patient was prepped and draped in the usual sterile fashion. The correct patient, procedure, and site was verified.   Injection Procedure Details:  Procedure Site One Meds Administered:  Meds ordered this encounter  Medications  . methylPREDNISolone acetate (DEPO-MEDROL) injection 80 mg     Laterality: Bilateral  Location/Site:  L4-L5  Needle size: 22 guage  Needle type: Spinal  Needle Placement: Articular  Findings:  -Comments: Excellent flow of contrast producing a partial arthrogram.  Procedure Details: The fluoroscope beam is vertically oriented in AP, and the inferior recess is visualized beneath the lower pole of the inferior apophyseal process, which represents the target point for needle insertion. When direct visualization is difficult the target point is located at the medial projection of the vertebral pedicle. The region overlying each aforementioned target is locally anesthetized with a 1 to 2 ml. volume of 1% Lidocaine without Epinephrine.   The spinal needle was inserted into each of the above mentioned facet joints using biplanar fluoroscopic guidance. A 0.25 to 0.5 ml. volume of Isovue-250 was injected and a partial facet joint arthrogram was obtained. A single spot film was obtained of the resulting arthrogram.    One to 1.25 ml of the steroid/anesthetic solution was then injected into each of the facet joints noted above.   Additional Comments:  The patient tolerated the  procedure well Dressing: Band-Aid    Post-procedure details: Patient was observed during the procedure. Post-procedure instructions were reviewed.  Patient left the clinic in stable condition.    Clinical History: Lumbar MRI dated 09/11/2012  This shows mild scoliosis with very minimal 2 to 3 mm listhesis of L2 on 3 and L5 on S1.  She has multilevel facet arthropathy and some lateral recess narrowing but no focal central stenosis or focal nerve compression.  There was a left foraminal protrusion at L2 and a right foraminal protrusion at L5.   She reports that she quit smoking about 47 years ago. Her smoking use included cigarettes. She quit after 10.00 years of use. She has never used smokeless tobacco. No results for input(s): HGBA1C, LABURIC in the last 8760 hours.  Objective:  VS:  HT:    WT:   BMI:     BP:(!) 125/54  HR:66bpm  TEMP: ( )  RESP:  Physical Exam  Constitutional: She is oriented to person, place, and time. She appears well-developed and well-nourished.  Eyes: Pupils are equal, round, and reactive to light. Conjunctivae and EOM are normal.  Cardiovascular: Normal rate and intact distal pulses.  Pulmonary/Chest: Effort normal.  Musculoskeletal:  Patient is slow to rise from a seated position and does have pain concordant to her symptoms when you extend and rotate and facet joint loading her back.  She is somewhat stiff.  There is no rigidity or cogwheeling.  She has good distal strength without clonus.  Right hip is stiff with rotation but does not reproduce her pain.  No pain over the greater trochanters.  Neurological: She is alert and oriented to person, place, and time. She exhibits normal muscle tone. Coordination normal.  Skin: Skin is warm and dry. No rash noted. No erythema.  Psychiatric: She has a normal mood and affect. Her behavior is normal.  Nursing note and vitals reviewed.   Ortho Exam Imaging: No results found.  Past Medical/Family/Surgical/Social  History: Medications & Allergies reviewed per EMR, new medications updated. Patient Active Problem List   Diagnosis Date Noted  . Trigger index finger of left hand 09/16/2017  . History of stroke 05/01/2017  . Hyperlipidemia 05/01/2017  . Vision loss of right eye 10/07/2016  . Medication monitoring encounter 07/02/2016  . Trochanteric bursitis of both hips 07/02/2016  . High risk medication use 07/02/2016  . Osteopenia 02/01/2016  . RA (rheumatoid arthritis) ([place]) 01/30/2016  . Osteoarthritis of both knees 01/30/2016  . Osteoarthritis of both feet 01/30/2016  . DDD (degenerative disc disease), lumbar 01/30/2016  . Primary osteoarthritis of right hip 01/30/2016  . Depression 01/30/2016  . Choledocholithiasis 03/13/2015  . Hypothyroidism 03/13/2015  . Polymyalgia rheumatica ([place]) 03/13/2015   Past Medical History:  Diagnosis Date  . Arthritis   . DDD (degenerative disc disease), lumbar 01/30/2016  . Glaucoma   . Hypothyroidism   . Multiple drug allergies    to multiple antibiotics.   . Osteoarthritis of both feet 01/30/2016  . Osteoarthritis of both hands 01/30/2016  . Osteoarthritis of both knees 01/30/2016  . Osteoarthritis of right hand 01/30/2016   moderate  . Polymyalgia rheumatica ([place])   . RA (rheumatoid arthritis) ([place]) 01/30/2016   Sero Negative  . Skin cancer of nose   . Stroke [facility])    right eye    Family History  Problem Relation Age of Onset  . Diabetes Mellitus II Mother   . Stroke Mother   . Diabetes Mellitus II Brother   . Stroke Brother   . Diabetes Brother   . Diabetes Sister   . Diabetes Sister   . Diabetes Sister   . Diabetes Sister   . Diabetes Brother   . Diabetes Brother   . Diabetes Brother   . Diabetes Brother   . Diabetes Brother    Past Surgical History:  Procedure Laterality Date  . APPENDECTOMY    . CHOLECYSTECTOMY    . COLONOSCOPY  ~2013   pooyps removed, one of them large per pt. Polyp pathology unclear. no rescreening study  planned due to age  . LOOP RECORDER INSERTION N/A 08/23/2016   Procedure: Loop Recorder Insertion;  Surgeon: Thompson Grayer, MD;  Location: [facility];  Service: Cardiovascular;  Laterality: N/A;  . TEE WITHOUT CARDIOVERSION N/A 08/23/2016   Procedure: TRANSESOPHAGEAL ECHOCARDIOGRAM (TEE);  Surgeon: Larey Dresser, MD;  Location: [facility] END[facility];  Service: Cardiovascular;  Laterality: N/A;  . TONSIL SURGERY     Social History   Occupational History  . Not on file  Tobacco Use  . Smoking status: Former Smoker    Years: 10.00    Types: Cigarettes    Last attempt to quit: 01/31/1970  Years since quitting: 47.7  . Smokeless tobacco: Never Used  Substance and Sexual Activity  . Alcohol use: No    Alcohol/week: 0.0 oz  . Drug use: Never  . Sexual activity: Not on file

## 2017-10-23 ENCOUNTER — Telehealth (INDEPENDENT_AMBULATORY_CARE_PROVIDER_SITE_OTHER): Payer: Self-pay | Admitting: *Deleted

## 2017-10-23 DIAGNOSIS — R32 Unspecified urinary incontinence: Secondary | ICD-10-CM | POA: Diagnosis not present

## 2017-10-23 DIAGNOSIS — F321 Major depressive disorder, single episode, moderate: Secondary | ICD-10-CM | POA: Diagnosis not present

## 2017-10-23 DIAGNOSIS — E039 Hypothyroidism, unspecified: Secondary | ICD-10-CM | POA: Diagnosis not present

## 2017-10-23 DIAGNOSIS — Z79899 Other long term (current) drug therapy: Secondary | ICD-10-CM | POA: Diagnosis not present

## 2017-10-23 DIAGNOSIS — E611 Iron deficiency: Secondary | ICD-10-CM | POA: Diagnosis not present

## 2017-10-23 DIAGNOSIS — E1169 Type 2 diabetes mellitus with other specified complication: Secondary | ICD-10-CM | POA: Diagnosis not present

## 2017-10-24 NOTE — Telephone Encounter (Signed)
Please let her know that the injections are a test to confirm the facet joint as a source of pain. If the injection helps more than 50% but does not last long then the patient is still candidate for Ablation.  IF the injection offered no benefit at all  then the patient is not a candidate.  IF no benefit al all, I would recommend lumbar MRI vs physical therapy vs follow up with me.

## 2017-10-27 NOTE — Telephone Encounter (Signed)
Called pt and left vm  

## 2017-10-29 NOTE — Progress Notes (Addendum)
[facility]  PATIENT: Beverly Estrada DOB: 1929-08-12   REASON FOR VISIT: Follow-up for stroke April 2018 HISTORY FROM: Patient and daughter    HISTORY OF PRESENT ILLNESS:Initial Consult  4/26/18PS ; Beverly Estrada is a 85 year pleasant Caucasian lady seen today for initial consultation visit. She is accompanied by her daughter and niece. History is up 10 from the patient, family and review of medical records provided by primary physician. I have personally reviewed her MRI images. She states she noticed sudden onset of memory difficulties 3 weeks ago. She has to stop in mid sentence and search for words. She's also had trouble remembering recent information having to write things down and she's always had a great memory. She denied any headache, slurred speech, gait or balance difficulties. She has had diminished vision in the right eye following what she calls a stroke affecting her INR year ago. She was seen by ophthalmologist at that time but did not have the records. She does not remember having a stroke workup at that time. She saw her primary physician ordered an MRI scan of the brain which have personally reviewed done on 07/16/16 shows a tiny diffusion positive infarct in the left midbrain centrally 12 peduncle as well as a subacute infarct in the left lentiform nucleus which shows postcontrast enhancement as well. Is mild age-related generalized cerebral atrophy and changes of small vessel disease. Patient states that her memory loss has persisted. Primary care physician also ordered a carotid ultrasound which I have reviewed which does not show significant extracranial stenosis. No vascular imaging study of the brain was obtained. Transthoracic echo has been ordered but not yet been completed. The patient denies any history suggestive of atrial fibrillation, palpitations, fainting episodes of syncope. She has no known cardiac problems. She was previously on aspirin and Plavix  has been added. She is tolerating these with minor bruising but no bleeding. She denies known vascular risk factors in the form of hypertension, hyperlipidemia or diabetes and did not have any recent lab work to see if lipid profile and hemoglobin A1c have been checked recently. Update 7/5/18PS ; She returns for follow-up after initial consultation 2 months ago. She is accompanied by her daughter. Patient is starting aspirin and Plavix but does complain of easy bruising. She had CTangiogram of the brain and neck done which I personally reviewed showed only mild at this cord changes without significant stenosis. She did undergo loop recorder insertion by Dr. Rayann Heman on 08/23/16  and so for A. fib has not been found. She remains on Lipitor which is tolerating well without muscle aches and pains. She convinced her mild short-term memory difficulties which appear to be unchanged. She is not participating in any regular    cognitively challenging activities. She does take fish oil every day. She has no new complaints today. UPDATE 1/31/2019CM Beverly Estrada, 84 year old female returns for follow-up with history of tiny diffusion positive infarct in the left midbrain centrally 12 peduncle as well as a subacute infarct in the left lentiform nucleus which shows postcontrast enhancement as well.  In April 2018 .  She is currently on Plavix for secondary stroke prevention without further stroke or TIA symptoms.  She has minimal bruising and no bleeding loop recorder was placed and so far no episodes of atrial fib.  She remains on Lipitor without myalgias She continues to complain of short-term memory problems, due to her retinal hemorrhage on the right and cataract on the left she has  trouble with cognitively challenging activities.  She says she continues to drive.  She continues to live alone but has friends to check on her frequently.  She returns for reevaluation without new complaints UPDATE 8/1/2019CM Beverly Estrada,  84 year old female returns for follow-up with her daughter with history of stroke event in April 2018.  She has done well since that time she is currently on Plavix for secondary stroke prevention without recurrent stroke or TIA symptoms.  She has minimal bruising and no bleeding.  Blood pressure in the office today 126/63.  She remains on Lipitor without myalgias.  MMSE today 26 out of 30 she continues to drive without difficulty.IADLs 7/8.  Independent in ADLs.  She continues to live alone and daughter checks on her frequently.  She had recent injections to her back chronic pain she denies any falls.  She returns for reevaluation REVIEW OF SYSTEMS: Full 14 system review of systems performed and notable only for those listed, all others are neg:  Constitutional: neg  Cardiovascular: neg Ear/Nose/Throat: neg  Skin: neg Eyes: Blurred vision  Respiratory: neg Gastroitestinal: neg  Hematology/Lymphatic: Easy bruising Endocrine: neg Musculoskeletal: Joint pain Allergy/Immunology: neg Neurological: neg Psychiatric: Decreased concentration Sleep : neg   ALLERGIES: Allergies  Allergen Reactions  . Biaxin [Clarithromycin] Other (See Comments)    Insomnia, couldn't sleep because med tasted so bad   . Augmentin [Amoxicillin-Pot Clavulanate] Rash  . Clindamycin/Lincomycin Rash    HOME MEDICATIONS: Outpatient Medications Prior to Visit  Medication Sig Dispense Refill  . atorvastatin (LIPITOR) 40 MG tablet Take 40 mg by mouth daily.  5  . calcium-vitamin D 250-100 MG-UNIT tablet Take 1 tablet by mouth daily.     . clopidogrel (PLAVIX) 75 MG tablet Take 75 mg by mouth daily.  5  . folic acid (FOLVITE) 1 MG tablet Take 1 tablet (1 mg total) by mouth daily. 90 tablet 4  . IRON PO Take 65 mg by mouth daily.    Marland Kitchen levothyroxine (SYNTHROID, LEVOTHROID) 75 MCG tablet daily.  2  . methotrexate (RHEUMATREX) 2.5 MG tablet TAKE 3 TABLETS BY MOUTH EVERY WEEK 36 tablet 0  . Multiple Vitamin (MULTIVITAMIN)  tablet Take 1 tablet by mouth daily.    . Omega-3 Fatty Acids (FISH OIL) 1000 MG CAPS Take 1,000 mg by mouth daily.     . sertraline (ZOLOFT) 50 MG tablet Take 50 mg by mouth daily.  4  . sertraline (ZOLOFT) 25 MG tablet Take 50 mg by mouth daily.    . sertraline (ZOLOFT) 25 MG tablet TAKE 1/2 TABLET BY MOUTH EVERY DAY FOR 6 DAYS THEN 1 TABLET EVERY DAY  2   Facility-Administered Medications Prior to Visit  Medication Dose Route Frequency Provider Last Rate Last Dose  . methylPREDNISolone acetate (DEPO-MEDROL) injection 80 mg  80 mg Other Once Magnus Sinning, MD        PAST MEDICAL HISTORY: Past Medical History:  Diagnosis Date  . Arthritis   . DDD (degenerative disc disease), lumbar 01/30/2016  . Glaucoma   . Hypothyroidism   . Multiple drug allergies    to multiple antibiotics.   . Osteoarthritis of both feet 01/30/2016  . Osteoarthritis of both hands 01/30/2016  . Osteoarthritis of both knees 01/30/2016  . Osteoarthritis of right hand 01/30/2016   moderate  . Polymyalgia rheumatica ([place])   . RA (rheumatoid arthritis) ([place]) 01/30/2016   Sero Negative  . Skin cancer of nose   . Stroke [facility])    right eye  PAST SURGICAL HISTORY: Past Surgical History:  Procedure Laterality Date  . APPENDECTOMY    . CHOLECYSTECTOMY    . COLONOSCOPY  ~2013   pooyps removed, one of them large per pt. Polyp pathology unclear. no rescreening study planned due to age  . LOOP RECORDER INSERTION N/A 08/23/2016   Procedure: Loop Recorder Insertion;  Surgeon: Thompson Grayer, MD;  Location: [facility];  Service: Cardiovascular;  Laterality: N/A;  . TEE WITHOUT CARDIOVERSION N/A 08/23/2016   Procedure: TRANSESOPHAGEAL ECHOCARDIOGRAM (TEE);  Surgeon: Larey Dresser, MD;  Location: [facility];  Service: Cardiovascular;  Laterality: N/A;  . TONSIL SURGERY      FAMILY HISTORY: Family History  Problem Relation Age of Onset  . Diabetes Mellitus II Mother   . Stroke Mother   . Diabetes  Mellitus II Brother   . Stroke Brother   . Diabetes Brother   . Diabetes Sister   . Diabetes Sister   . Diabetes Sister   . Diabetes Sister   . Diabetes Brother   . Diabetes Brother   . Diabetes Brother   . Diabetes Brother   . Diabetes Brother     SOCIAL HISTORY: Social History   Socioeconomic History  . Marital status: Widowed    Spouse name: Not on file  . Number of children: Not on file  . Years of education: Not on file  . Highest education level: Not on file  Occupational History  . Not on file  Social Needs  . Financial resource strain: Not on file  . Food insecurity:    Worry: Not on file    Inability: Not on file  . Transportation needs:    Medical: Not on file    Non-medical: Not on file  Tobacco Use  . Smoking status: Former Smoker    Years: 10.00    Types: Cigarettes    Last attempt to quit: 01/31/1970    Years since quitting: 47.7  . Smokeless tobacco: Never Used  Substance and Sexual Activity  . Alcohol use: No    Alcohol/week: 0.0 oz  . Drug use: Never  . Sexual activity: Not on file  Lifestyle  . Physical activity:    Days per week: Not on file    Minutes per session: Not on file  . Stress: Not on file  Relationships  . Social connections:    Talks on phone: Not on file    Gets together: Not on file    Attends religious service: Not on file    Active member of club or organization: Not on file    Attends meetings of clubs or organizations: Not on file    Relationship status: Not on file  . Intimate partner violence:    Fear of current or ex partner: Not on file    Emotionally abused: Not on file    Physically abused: Not on file    Forced sexual activity: Not on file  Other Topics Concern  . Not on file  Social History Narrative  . Not on file     PHYSICAL EXAM  Vitals:   10/30/17 1037  BP: 126/63  Pulse: 61  Weight: 147 lb (66.7 kg)   Body mass index is 24.46 kg/m.  Generalized: Well developed, in no acute distress    Head: normocephalic and atraumatic,. Oropharynx benign  Neck: Supple, no carotid bruits  Cardiac: Regular rate rhythm, no murmur  Musculoskeletal: No deformity , mild kyphoscoliosis  Neurological examination   Mentation: Alert MMSE  26/30. Last 25/30, recent and remote memory appear intact Follows all commands speech and language fluent.   Cranial nerve II-XII: Pupils were equal round reactive to light extraocular movements were full, visual field were full on confrontational test.  Diminished visual acuity in the right eye . Facial sensation and strength were normal. Hard of hearing Uvula tongue midline. head turning and shoulder shrug were normal and symmetric.Tongue protrusion into cheek strength was normal. Motor: normal bulk and tone, full strength in the BUE, BLE,  Sensory: normal and symmetric to light touch, pinprick, and  Vibration, in the upper and lower extremities Coordination: finger-nose-finger, heel-to-shin bilaterally, no dysmetria, no tremor Reflexes: 1+ upper lower and symmetric plantar responses were flexor bilaterally. Gait and Station: Rising up from seated position without assistance, stooped posture   moderate stride, good arm swing, smooth turning, Unable to perform tiptoe, and heel walking without difficulty or tandem gait .  No assistive device  DIAGNOSTIC DATA (LABS, IMAGING, TESTING) - I reviewed patient records, labs, notes, testing and imaging myself where available.  Lab Results  Component Value Date   WBC 7.8 04/03/2017   HGB 11.5 (L) 04/03/2017   HCT 35.0 04/03/2017   MCV 92.3 04/03/2017   PLT 235 04/03/2017      Component Value Date/Time   NA 142 04/03/2017 1534   NA 141 12/26/2015   K 5.3 04/03/2017 1534   CL 106 04/03/2017 1534   CO2 30 04/03/2017 1534   GLUCOSE 250 (H) 04/03/2017 1534   BUN 13 04/03/2017 1534   BUN 16 12/26/2015   CREATININE 0.74 04/03/2017 1534   CALCIUM 9.8 04/03/2017 1534   PROT 7.0 04/03/2017 1534   ALBUMIN 4.2  10/08/2016 1537   AST 17 04/03/2017 1534   ALT 10 04/03/2017 1534   ALKPHOS 56 10/08/2016 1537   BILITOT 0.8 04/03/2017 1534   GFRNONAA 74 04/03/2017 1534   GFRAA 86 04/03/2017 1534     ASSESSMENT AND PLAN 84 year old lady with subacute memory loss possibly related to recent basal ganglia infarct as well as an acute clinically silent midbrain infarct as well. Remote history of right eye vision loss due to suspected retinal  artery occlusion.  The patient is a current patient of Dr. Leonie Man who is out of the office today . This note is sent to the work in doctor.      PLAN:Stressed the importance of management of risk factors to prevent further stroke Continue Plavix for secondary stroke prevention No further stroke or TIA symptoms since April 2018 Maintain strict control of hypertension with blood pressure goal below 130/90, today's reading 126/63 Control of diabetes with hemoglobin A1c below 6.5 followed by primary care  Cholesterol with LDL cholesterol less than 70, followed by primary care, continue Lipitor Exercise by walking, advise to use a cane , eat healthy diet with whole grains,  fresh fruits and vegetables Take fish oil for memory loss MMSE 26/30  Which is stable Discharge from stroke clinic Follow-up with primary care for stroke risk factors maintain strict control of hypertension with blood pressure goal below 130/90, diabetes with hemoglobin A1c goal below 6.5% and lipids with LDL cholesterol goal below 70 mg/dL I spent 25 minutes in total face to face time with the patient more than 50% of which was spent counseling and coordination of care, reviewing test results reviewing medications and discussing and reviewing the diagnosis of stroke and management of risk factors.  Patient was strongly encouraged to be careful with driving  due to  her visual problems.She was advised  to participate in cognitively challenging activities like solving crossword puzzles, sudoku and playing  bridge. Dennie Bible, [facility], [facility], APRN  [facility] [address] [place], [place] [address] [phone]  I reviewed the above note and documentation by the Nurse Practitioner and agree with the history, physical exam, assessment and plan as outlined above. I was immediately available for face-to-face consultation. Star Age, MD, PhD [facility])

## 2017-10-29 NOTE — Telephone Encounter (Signed)
Patient's daughter states that her mother did not get any relief at all from the injections. She said that she would talk to her again to make sure and will call us back.

## 2017-10-30 ENCOUNTER — Ambulatory Visit (INDEPENDENT_AMBULATORY_CARE_PROVIDER_SITE_OTHER): Payer: Medicare Other | Admitting: Nurse Practitioner

## 2017-10-30 ENCOUNTER — Encounter: Payer: Self-pay | Admitting: Nurse Practitioner

## 2017-10-30 VITALS — BP 126/63 | HR 61 | Wt 147.0 lb

## 2017-10-30 DIAGNOSIS — Z8673 Personal history of transient ischemic attack (TIA), and cerebral infarction without residual deficits: Secondary | ICD-10-CM

## 2017-10-30 DIAGNOSIS — I639 Cerebral infarction, unspecified: Secondary | ICD-10-CM | POA: Diagnosis not present

## 2017-10-30 DIAGNOSIS — E785 Hyperlipidemia, unspecified: Secondary | ICD-10-CM

## 2017-10-30 NOTE — Patient Instructions (Addendum)
Stressed the importance of management of risk factors to prevent further stroke Continue Plavix for secondary stroke prevention Maintain strict control of hypertension with blood pressure goal below 130/90, today's reading 126/63 Control of diabetes with hemoglobin A1c below 6.5 followed by primary care  Cholesterol with LDL cholesterol less than 70, followed by primary care, continue Lipitor Exercise by walking, advise to use a cane , eat healthy diet with whole grains,  fresh fruits and vegetables Take fish oil for memory loss MMSE 2630  Which is stable Discharge from stroke clinic  Stroke Prevention Some health problems and behaviors may make it more likely for you to have a stroke. Below are ways to lessen your risk of having a stroke.  Be active for at least 30 minutes on most or all days.  Do not smoke. Try not to be around others who smoke.  Do not drink too much alcohol. ? Do not have more than 2 drinks a day if you are a man. ? Do not have more than 1 drink a day if you are a woman and are not pregnant.  Eat healthy foods, such as fruits and vegetables. If you were put on a specific diet, follow the diet as told.  Keep your cholesterol levels under control through diet and medicines. Look for foods that are low in saturated fat, trans fat, cholesterol, and are high in fiber.  If you have diabetes, follow all diet plans and take your medicine as told.  Ask your doctor if you need treatment to lower your blood pressure. If you have high blood pressure (hypertension), follow all diet plans and take your medicine as told by your doctor.  If you are 40-80 years old, have your blood pressure checked every 3-5 years. If you are age 63 or older, have your blood pressure checked every year.  Keep a healthy weight. Eat foods that are low in calories, salt, saturated fat, trans fat, and cholesterol.  Do not take drugs.  Avoid birth control pills, if this applies. Talk to your doctor  about the risks of taking birth control pills.  Talk to your doctor if you have sleep problems (sleep apnea).  Take all medicine as told by your doctor. ? You may be told to take aspirin or blood thinner medicine. Take this medicine as told by your doctor. ? Understand your medicine instructions.  Make sure any other conditions you have are being taken care of.  Get help right away if:  You suddenly lose feeling (you feel numb) or have weakness in your face, arm, or leg.  Your face or eyelid hangs down to one side.  You suddenly feel confused.  You have trouble talking (aphasia) or understanding what people are saying.  You suddenly have trouble seeing in one or both eyes.  You suddenly have trouble walking.  You are dizzy.  You lose your balance or your movements are clumsy (uncoordinated).  You suddenly have a very bad headache and you do not know the cause.  You have new chest pain.  Your heart feels like it is fluttering or skipping a beat (irregular heartbeat). Do not wait to see if the symptoms above go away. Get help right away. Call your local emergency services (911 in [place] Do not drive yourself to the hospital. This information is not intended to replace advice given to you by your health care provider. Make sure you discuss any questions you have with your health care provider. Document Released: 09/17/2011  Document Revised: 08/24/2015 Document Reviewed: 09/18/2012 Elsevier Interactive Patient Education  [employer].

## 2017-11-03 ENCOUNTER — Ambulatory Visit (INDEPENDENT_AMBULATORY_CARE_PROVIDER_SITE_OTHER): Payer: Medicare Other | Admitting: *Deleted

## 2017-11-03 DIAGNOSIS — I639 Cerebral infarction, unspecified: Secondary | ICD-10-CM | POA: Diagnosis not present

## 2017-11-04 NOTE — Progress Notes (Signed)
Carelink Summary Report / Loop Recorder 

## 2017-11-05 LAB — CUP PACEART REMOTE DEVICE CHECK
Date Time Interrogation Session: 20190703004016
Implantable Pulse Generator Implant Date: 20180525

## 2017-11-07 ENCOUNTER — Telehealth: Payer: Self-pay | Admitting: Rheumatology

## 2017-11-07 NOTE — Telephone Encounter (Signed)
Returned patient's call and advised patient that we do not prescribe those for her. Patient verbalized understanding and stated she was confused and should have called her PCP to refill them.

## 2017-11-07 NOTE — Telephone Encounter (Signed)
Patient called requesting prescription refill of her "contour test strip the needle part" to be called into [facility] on [address] in [place].

## 2017-11-09 IMAGING — CT CT ANGIO NECK
1 of 5 series · 3 of 16 positions shown · IV contrast (APPLIED)
Comparison: MRI head 07/16/2016

CLINICAL DATA: Memory loss.  Cryptogenic stroke

EXAM:
CT ANGIOGRAPHY HEAD AND NECK
TECHNIQUE: Multidetector CT imaging of the head and neck was performed using
the standard protocol during bolus administration of intravenous
contrast. Multiplanar CT image reconstructions and MIPs were
obtained to evaluate the vascular anatomy. Carotid stenosis
measurements (when applicable) are obtained utilizing NASCET
criteria, using the distal internal carotid diameter as the
denominator.
CONTRAST:  100 mL Isovue 370 IV

[Series 5: head/neck angio · axial · 0.45mm/px · z∈[+648,+1032]mm · 3 of 129 slices shown]
[im 1/129  soft-tissue]
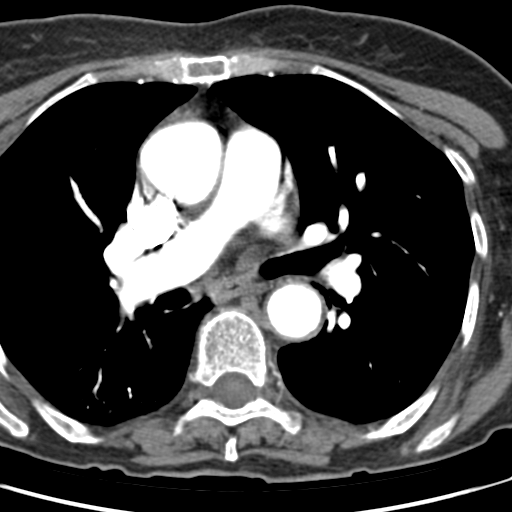
[im 65/129  bone]
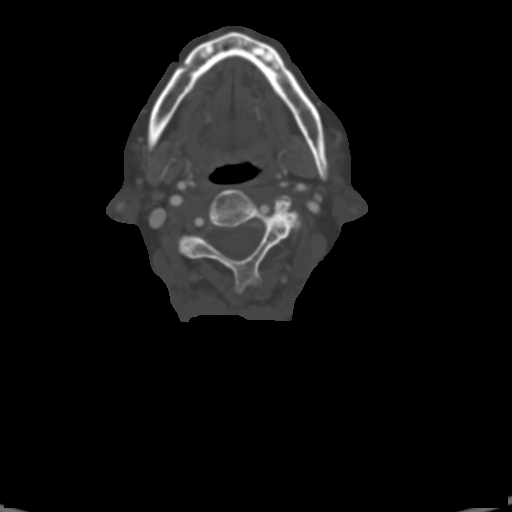
[im 129/129  soft-tissue]
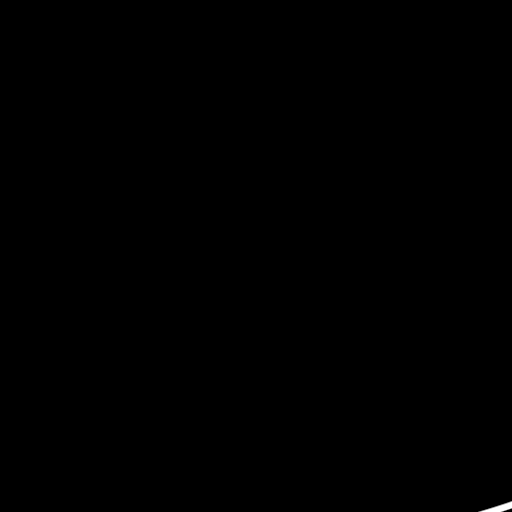

[3 of 16 positions shown; findings below may reference images not displayed]

FINDINGS: CT HEAD FINDINGS

Brain: Generalized atrophy with chronic microvascular ischemic
change in the white matter. Chronic lacunar infarction in the left
internal capsule posteriorly. Negative for acute infarct. Negative
for hemorrhage or mass.

Vascular: Negative for hyperdense vessel. Atherosclerotic
calcification.

Skull: Negative

Sinuses: Mild mucosal edema paranasal sinuses.

Orbits: Bilateral cataract removal.  No orbital mass.

Review of the MIP images confirms the above findings

CTA NECK FINDINGS

Aortic arch: Mild atherosclerotic disease in the aortic arch.

Right carotid system: Right carotid widely patent without stenosis
or significant disease

Left carotid system: Left carotid widely patent without stenosis or
significant disease

Vertebral arteries: Widely patent vertebral artery bilaterally with
mild atherosclerotic calcification distal right vertebral artery.

Skeleton: Mild disc and facet degeneration in the cervical spine. No
acute skeletal abnormality.

Other neck: Negative

Upper chest: Apical scarring bilaterally.  No lung lesion.

Review of the MIP images confirms the above findings

CTA HEAD FINDINGS

Anterior circulation: Atherosclerotic calcification in the cavernous
carotid bilaterally without significant stenosis. Anterior and
middle cerebral arteries widely patent

Posterior circulation: Mild atherosclerotic disease in the distal
right vertebral artery without stenosis. Basilar widely patent. PICA
patent bilaterally. Posterior cerebral arteries patent bilaterally.
Moderate stenosis of the mid right posterior cerebral artery. Left
posterior cerebral artery widely patent

Venous sinuses: Patent

Anatomic variants: None

Delayed phase: Normal enhancement.

Review of the MIP images confirms the above findings
IMPRESSION: Mild atherosclerotic disease. No significant extracranial stenosis.
Moderate stenosis right posterior cerebral artery. No large vessel
occlusion

## 2017-11-11 DIAGNOSIS — M2042 Other hammer toe(s) (acquired), left foot: Secondary | ICD-10-CM | POA: Diagnosis not present

## 2017-11-11 DIAGNOSIS — B351 Tinea unguium: Secondary | ICD-10-CM | POA: Diagnosis not present

## 2017-11-11 DIAGNOSIS — M2041 Other hammer toe(s) (acquired), right foot: Secondary | ICD-10-CM | POA: Diagnosis not present

## 2017-11-11 DIAGNOSIS — E119 Type 2 diabetes mellitus without complications: Secondary | ICD-10-CM | POA: Diagnosis not present

## 2017-11-12 DIAGNOSIS — K5909 Other constipation: Secondary | ICD-10-CM | POA: Diagnosis not present

## 2017-11-12 DIAGNOSIS — N952 Postmenopausal atrophic vaginitis: Secondary | ICD-10-CM | POA: Diagnosis not present

## 2017-11-12 DIAGNOSIS — R32 Unspecified urinary incontinence: Secondary | ICD-10-CM | POA: Diagnosis not present

## 2017-11-12 DIAGNOSIS — Z79899 Other long term (current) drug therapy: Secondary | ICD-10-CM | POA: Diagnosis not present

## 2017-11-15 DIAGNOSIS — S79912A Unspecified injury of left hip, initial encounter: Secondary | ICD-10-CM | POA: Diagnosis not present

## 2017-11-15 DIAGNOSIS — S299XXA Unspecified injury of thorax, initial encounter: Secondary | ICD-10-CM | POA: Diagnosis not present

## 2017-11-15 DIAGNOSIS — M25552 Pain in left hip: Secondary | ICD-10-CM | POA: Diagnosis not present

## 2017-11-15 DIAGNOSIS — S79922A Unspecified injury of left thigh, initial encounter: Secondary | ICD-10-CM | POA: Diagnosis not present

## 2017-11-16 DIAGNOSIS — S299XXA Unspecified injury of thorax, initial encounter: Secondary | ICD-10-CM | POA: Diagnosis not present

## 2017-11-16 DIAGNOSIS — M25552 Pain in left hip: Secondary | ICD-10-CM | POA: Diagnosis not present

## 2017-11-16 DIAGNOSIS — S79912A Unspecified injury of left hip, initial encounter: Secondary | ICD-10-CM | POA: Diagnosis not present

## 2017-11-16 DIAGNOSIS — S79922A Unspecified injury of left thigh, initial encounter: Secondary | ICD-10-CM | POA: Diagnosis not present

## 2017-11-17 DIAGNOSIS — W19XXXA Unspecified fall, initial encounter: Secondary | ICD-10-CM | POA: Diagnosis not present

## 2017-11-17 DIAGNOSIS — Z8673 Personal history of transient ischemic attack (TIA), and cerebral infarction without residual deficits: Secondary | ICD-10-CM | POA: Diagnosis not present

## 2017-11-17 DIAGNOSIS — E119 Type 2 diabetes mellitus without complications: Secondary | ICD-10-CM | POA: Diagnosis not present

## 2017-11-17 DIAGNOSIS — F329 Major depressive disorder, single episode, unspecified: Secondary | ICD-10-CM | POA: Diagnosis not present

## 2017-11-17 DIAGNOSIS — M1990 Unspecified osteoarthritis, unspecified site: Secondary | ICD-10-CM | POA: Diagnosis not present

## 2017-11-17 DIAGNOSIS — E039 Hypothyroidism, unspecified: Secondary | ICD-10-CM | POA: Diagnosis not present

## 2017-11-17 DIAGNOSIS — Z79899 Other long term (current) drug therapy: Secondary | ICD-10-CM | POA: Diagnosis not present

## 2017-11-17 DIAGNOSIS — E7800 Pure hypercholesterolemia, unspecified: Secondary | ICD-10-CM | POA: Diagnosis not present

## 2017-11-17 DIAGNOSIS — S32592A Other specified fracture of left pubis, initial encounter for closed fracture: Secondary | ICD-10-CM | POA: Diagnosis not present

## 2017-11-17 DIAGNOSIS — Z881 Allergy status to other antibiotic agents status: Secondary | ICD-10-CM | POA: Diagnosis not present

## 2017-11-17 DIAGNOSIS — Z888 Allergy status to other drugs, medicaments and biological substances status: Secondary | ICD-10-CM | POA: Diagnosis not present

## 2017-11-17 DIAGNOSIS — Z880 Allergy status to penicillin: Secondary | ICD-10-CM | POA: Diagnosis not present

## 2017-11-17 DIAGNOSIS — M353 Polymyalgia rheumatica: Secondary | ICD-10-CM

## 2017-11-17 DIAGNOSIS — S32599A Other specified fracture of unspecified pubis, initial encounter for closed fracture: Secondary | ICD-10-CM | POA: Diagnosis not present

## 2017-11-17 DIAGNOSIS — S32512A Fracture of superior rim of left pubis, initial encounter for closed fracture: Secondary | ICD-10-CM | POA: Diagnosis not present

## 2017-11-18 DIAGNOSIS — S32599A Other specified fracture of unspecified pubis, initial encounter for closed fracture: Secondary | ICD-10-CM | POA: Diagnosis not present

## 2017-11-19 DIAGNOSIS — M810 Age-related osteoporosis without current pathological fracture: Secondary | ICD-10-CM | POA: Diagnosis not present

## 2017-11-19 DIAGNOSIS — Z741 Need for assistance with personal care: Secondary | ICD-10-CM | POA: Diagnosis not present

## 2017-11-19 DIAGNOSIS — R262 Difficulty in walking, not elsewhere classified: Secondary | ICD-10-CM | POA: Diagnosis not present

## 2017-11-19 DIAGNOSIS — S32599D Other specified fracture of unspecified pubis, subsequent encounter for fracture with routine healing: Secondary | ICD-10-CM | POA: Diagnosis not present

## 2017-11-19 DIAGNOSIS — D649 Anemia, unspecified: Secondary | ICD-10-CM | POA: Diagnosis not present

## 2017-11-19 DIAGNOSIS — R2681 Unsteadiness on feet: Secondary | ICD-10-CM | POA: Diagnosis not present

## 2017-11-19 DIAGNOSIS — R2689 Other abnormalities of gait and mobility: Secondary | ICD-10-CM | POA: Diagnosis not present

## 2017-11-20 DIAGNOSIS — S32599D Other specified fracture of unspecified pubis, subsequent encounter for fracture with routine healing: Secondary | ICD-10-CM | POA: Diagnosis not present

## 2017-11-20 DIAGNOSIS — R2689 Other abnormalities of gait and mobility: Secondary | ICD-10-CM | POA: Diagnosis not present

## 2017-11-20 DIAGNOSIS — R2681 Unsteadiness on feet: Secondary | ICD-10-CM | POA: Diagnosis not present

## 2017-11-20 DIAGNOSIS — Z741 Need for assistance with personal care: Secondary | ICD-10-CM | POA: Diagnosis not present

## 2017-11-21 DIAGNOSIS — Z741 Need for assistance with personal care: Secondary | ICD-10-CM | POA: Diagnosis not present

## 2017-11-21 DIAGNOSIS — S32599D Other specified fracture of unspecified pubis, subsequent encounter for fracture with routine healing: Secondary | ICD-10-CM | POA: Diagnosis not present

## 2017-11-21 DIAGNOSIS — R2681 Unsteadiness on feet: Secondary | ICD-10-CM | POA: Diagnosis not present

## 2017-11-21 DIAGNOSIS — R2689 Other abnormalities of gait and mobility: Secondary | ICD-10-CM | POA: Diagnosis not present

## 2017-11-22 DIAGNOSIS — Z741 Need for assistance with personal care: Secondary | ICD-10-CM | POA: Diagnosis not present

## 2017-11-22 DIAGNOSIS — R2681 Unsteadiness on feet: Secondary | ICD-10-CM | POA: Diagnosis not present

## 2017-11-22 DIAGNOSIS — S32599D Other specified fracture of unspecified pubis, subsequent encounter for fracture with routine healing: Secondary | ICD-10-CM | POA: Diagnosis not present

## 2017-11-22 DIAGNOSIS — R2689 Other abnormalities of gait and mobility: Secondary | ICD-10-CM | POA: Diagnosis not present

## 2017-11-23 DIAGNOSIS — Z741 Need for assistance with personal care: Secondary | ICD-10-CM | POA: Diagnosis not present

## 2017-11-23 DIAGNOSIS — R2689 Other abnormalities of gait and mobility: Secondary | ICD-10-CM | POA: Diagnosis not present

## 2017-11-23 DIAGNOSIS — R2681 Unsteadiness on feet: Secondary | ICD-10-CM | POA: Diagnosis not present

## 2017-11-23 DIAGNOSIS — S32599D Other specified fracture of unspecified pubis, subsequent encounter for fracture with routine healing: Secondary | ICD-10-CM | POA: Diagnosis not present

## 2017-11-24 DIAGNOSIS — R2689 Other abnormalities of gait and mobility: Secondary | ICD-10-CM | POA: Diagnosis not present

## 2017-11-24 DIAGNOSIS — Z741 Need for assistance with personal care: Secondary | ICD-10-CM | POA: Diagnosis not present

## 2017-11-24 DIAGNOSIS — S32599D Other specified fracture of unspecified pubis, subsequent encounter for fracture with routine healing: Secondary | ICD-10-CM | POA: Diagnosis not present

## 2017-11-24 DIAGNOSIS — R2681 Unsteadiness on feet: Secondary | ICD-10-CM | POA: Diagnosis not present

## 2017-11-25 DIAGNOSIS — S32599D Other specified fracture of unspecified pubis, subsequent encounter for fracture with routine healing: Secondary | ICD-10-CM | POA: Diagnosis not present

## 2017-11-25 DIAGNOSIS — R2681 Unsteadiness on feet: Secondary | ICD-10-CM | POA: Diagnosis not present

## 2017-11-25 DIAGNOSIS — Z741 Need for assistance with personal care: Secondary | ICD-10-CM | POA: Diagnosis not present

## 2017-11-25 DIAGNOSIS — R2689 Other abnormalities of gait and mobility: Secondary | ICD-10-CM | POA: Diagnosis not present

## 2017-11-26 DIAGNOSIS — S32599D Other specified fracture of unspecified pubis, subsequent encounter for fracture with routine healing: Secondary | ICD-10-CM | POA: Diagnosis not present

## 2017-11-26 DIAGNOSIS — Z741 Need for assistance with personal care: Secondary | ICD-10-CM | POA: Diagnosis not present

## 2017-11-26 DIAGNOSIS — R2681 Unsteadiness on feet: Secondary | ICD-10-CM | POA: Diagnosis not present

## 2017-11-26 DIAGNOSIS — R2689 Other abnormalities of gait and mobility: Secondary | ICD-10-CM | POA: Diagnosis not present

## 2017-11-27 DIAGNOSIS — R2681 Unsteadiness on feet: Secondary | ICD-10-CM | POA: Diagnosis not present

## 2017-11-27 DIAGNOSIS — S32599D Other specified fracture of unspecified pubis, subsequent encounter for fracture with routine healing: Secondary | ICD-10-CM | POA: Diagnosis not present

## 2017-11-27 DIAGNOSIS — R2689 Other abnormalities of gait and mobility: Secondary | ICD-10-CM | POA: Diagnosis not present

## 2017-11-27 DIAGNOSIS — Z741 Need for assistance with personal care: Secondary | ICD-10-CM | POA: Diagnosis not present

## 2017-11-28 DIAGNOSIS — S32599D Other specified fracture of unspecified pubis, subsequent encounter for fracture with routine healing: Secondary | ICD-10-CM | POA: Diagnosis not present

## 2017-11-28 DIAGNOSIS — S3289XA Fracture of other parts of pelvis, initial encounter for closed fracture: Secondary | ICD-10-CM | POA: Diagnosis not present

## 2017-11-28 DIAGNOSIS — R2689 Other abnormalities of gait and mobility: Secondary | ICD-10-CM | POA: Diagnosis not present

## 2017-11-28 DIAGNOSIS — R2681 Unsteadiness on feet: Secondary | ICD-10-CM | POA: Diagnosis not present

## 2017-11-28 DIAGNOSIS — Z741 Need for assistance with personal care: Secondary | ICD-10-CM | POA: Diagnosis not present

## 2017-11-30 DIAGNOSIS — R2689 Other abnormalities of gait and mobility: Secondary | ICD-10-CM | POA: Diagnosis not present

## 2017-11-30 DIAGNOSIS — Z741 Need for assistance with personal care: Secondary | ICD-10-CM | POA: Diagnosis not present

## 2017-11-30 DIAGNOSIS — R2681 Unsteadiness on feet: Secondary | ICD-10-CM | POA: Diagnosis not present

## 2017-11-30 DIAGNOSIS — S32599D Other specified fracture of unspecified pubis, subsequent encounter for fracture with routine healing: Secondary | ICD-10-CM | POA: Diagnosis not present

## 2017-12-01 DIAGNOSIS — R2689 Other abnormalities of gait and mobility: Secondary | ICD-10-CM | POA: Diagnosis not present

## 2017-12-01 DIAGNOSIS — S32599D Other specified fracture of unspecified pubis, subsequent encounter for fracture with routine healing: Secondary | ICD-10-CM | POA: Diagnosis not present

## 2017-12-01 DIAGNOSIS — M25561 Pain in right knee: Secondary | ICD-10-CM | POA: Diagnosis not present

## 2017-12-01 DIAGNOSIS — Z741 Need for assistance with personal care: Secondary | ICD-10-CM | POA: Diagnosis not present

## 2017-12-01 DIAGNOSIS — R2681 Unsteadiness on feet: Secondary | ICD-10-CM | POA: Diagnosis not present

## 2017-12-02 ENCOUNTER — Telehealth: Payer: Self-pay | Admitting: Cardiology

## 2017-12-02 DIAGNOSIS — Z741 Need for assistance with personal care: Secondary | ICD-10-CM | POA: Diagnosis not present

## 2017-12-02 DIAGNOSIS — R2681 Unsteadiness on feet: Secondary | ICD-10-CM | POA: Diagnosis not present

## 2017-12-02 DIAGNOSIS — R2689 Other abnormalities of gait and mobility: Secondary | ICD-10-CM | POA: Diagnosis not present

## 2017-12-02 DIAGNOSIS — S32599D Other specified fracture of unspecified pubis, subsequent encounter for fracture with routine healing: Secondary | ICD-10-CM | POA: Diagnosis not present

## 2017-12-02 NOTE — Telephone Encounter (Signed)
Spoke w/ pt daughter and requested that pt send a manual transmission b/c her home monitor has not updated in at least 14 days. Patient is currently in rehab due to recent falls. Informed pt daughter that the monitor can be taken to the facility but pt daughter stated that she does not believe that is a good idea due to the amount of work the caregivers already have at the facility.  Informed pt daughter that I would note that the home monitor will be disconnected until the patient is home. Pt daughter verbalized understanding.

## 2017-12-03 DIAGNOSIS — R2681 Unsteadiness on feet: Secondary | ICD-10-CM | POA: Diagnosis not present

## 2017-12-03 DIAGNOSIS — M1711 Unilateral primary osteoarthritis, right knee: Secondary | ICD-10-CM | POA: Diagnosis not present

## 2017-12-03 DIAGNOSIS — R2689 Other abnormalities of gait and mobility: Secondary | ICD-10-CM | POA: Diagnosis not present

## 2017-12-03 DIAGNOSIS — Z741 Need for assistance with personal care: Secondary | ICD-10-CM | POA: Diagnosis not present

## 2017-12-03 DIAGNOSIS — S32599D Other specified fracture of unspecified pubis, subsequent encounter for fracture with routine healing: Secondary | ICD-10-CM | POA: Diagnosis not present

## 2017-12-03 DIAGNOSIS — M25561 Pain in right knee: Secondary | ICD-10-CM | POA: Diagnosis not present

## 2017-12-03 DIAGNOSIS — S8001XA Contusion of right knee, initial encounter: Secondary | ICD-10-CM | POA: Diagnosis not present

## 2017-12-04 DIAGNOSIS — Z741 Need for assistance with personal care: Secondary | ICD-10-CM | POA: Diagnosis not present

## 2017-12-04 DIAGNOSIS — R2681 Unsteadiness on feet: Secondary | ICD-10-CM | POA: Diagnosis not present

## 2017-12-04 DIAGNOSIS — S32599D Other specified fracture of unspecified pubis, subsequent encounter for fracture with routine healing: Secondary | ICD-10-CM | POA: Diagnosis not present

## 2017-12-04 DIAGNOSIS — R2689 Other abnormalities of gait and mobility: Secondary | ICD-10-CM | POA: Diagnosis not present

## 2017-12-05 ENCOUNTER — Ambulatory Visit (INDEPENDENT_AMBULATORY_CARE_PROVIDER_SITE_OTHER): Payer: Medicare Other | Admitting: *Deleted

## 2017-12-05 DIAGNOSIS — R2689 Other abnormalities of gait and mobility: Secondary | ICD-10-CM | POA: Diagnosis not present

## 2017-12-05 DIAGNOSIS — R2681 Unsteadiness on feet: Secondary | ICD-10-CM | POA: Diagnosis not present

## 2017-12-05 DIAGNOSIS — I639 Cerebral infarction, unspecified: Secondary | ICD-10-CM | POA: Diagnosis not present

## 2017-12-05 DIAGNOSIS — S32599D Other specified fracture of unspecified pubis, subsequent encounter for fracture with routine healing: Secondary | ICD-10-CM | POA: Diagnosis not present

## 2017-12-05 DIAGNOSIS — Z741 Need for assistance with personal care: Secondary | ICD-10-CM | POA: Diagnosis not present

## 2017-12-06 DIAGNOSIS — S32599D Other specified fracture of unspecified pubis, subsequent encounter for fracture with routine healing: Secondary | ICD-10-CM | POA: Diagnosis not present

## 2017-12-06 DIAGNOSIS — R2689 Other abnormalities of gait and mobility: Secondary | ICD-10-CM | POA: Diagnosis not present

## 2017-12-06 DIAGNOSIS — Z741 Need for assistance with personal care: Secondary | ICD-10-CM | POA: Diagnosis not present

## 2017-12-06 DIAGNOSIS — R2681 Unsteadiness on feet: Secondary | ICD-10-CM | POA: Diagnosis not present

## 2017-12-06 NOTE — Progress Notes (Signed)
Carelink Summary Report / Loop Recorder 

## 2017-12-09 DIAGNOSIS — E039 Hypothyroidism, unspecified: Secondary | ICD-10-CM | POA: Diagnosis not present

## 2017-12-09 DIAGNOSIS — Z7902 Long term (current) use of antithrombotics/antiplatelets: Secondary | ICD-10-CM | POA: Diagnosis not present

## 2017-12-09 DIAGNOSIS — Z8673 Personal history of transient ischemic attack (TIA), and cerebral infarction without residual deficits: Secondary | ICD-10-CM | POA: Diagnosis not present

## 2017-12-09 DIAGNOSIS — Z85828 Personal history of other malignant neoplasm of skin: Secondary | ICD-10-CM | POA: Diagnosis not present

## 2017-12-09 DIAGNOSIS — Z7982 Long term (current) use of aspirin: Secondary | ICD-10-CM | POA: Diagnosis not present

## 2017-12-09 DIAGNOSIS — E119 Type 2 diabetes mellitus without complications: Secondary | ICD-10-CM | POA: Diagnosis not present

## 2017-12-09 DIAGNOSIS — Z87440 Personal history of urinary (tract) infections: Secondary | ICD-10-CM | POA: Diagnosis not present

## 2017-12-09 DIAGNOSIS — Z87891 Personal history of nicotine dependence: Secondary | ICD-10-CM | POA: Diagnosis not present

## 2017-12-09 DIAGNOSIS — Z9181 History of falling: Secondary | ICD-10-CM | POA: Diagnosis not present

## 2017-12-09 DIAGNOSIS — S32592D Other specified fracture of left pubis, subsequent encounter for fracture with routine healing: Secondary | ICD-10-CM | POA: Diagnosis not present

## 2017-12-09 DIAGNOSIS — F329 Major depressive disorder, single episode, unspecified: Secondary | ICD-10-CM | POA: Diagnosis not present

## 2017-12-09 DIAGNOSIS — M353 Polymyalgia rheumatica: Secondary | ICD-10-CM | POA: Diagnosis not present

## 2017-12-11 DIAGNOSIS — S32592D Other specified fracture of left pubis, subsequent encounter for fracture with routine healing: Secondary | ICD-10-CM | POA: Diagnosis not present

## 2017-12-11 DIAGNOSIS — M353 Polymyalgia rheumatica: Secondary | ICD-10-CM | POA: Diagnosis not present

## 2017-12-11 DIAGNOSIS — F329 Major depressive disorder, single episode, unspecified: Secondary | ICD-10-CM | POA: Diagnosis not present

## 2017-12-11 DIAGNOSIS — E119 Type 2 diabetes mellitus without complications: Secondary | ICD-10-CM | POA: Diagnosis not present

## 2017-12-11 DIAGNOSIS — Z9181 History of falling: Secondary | ICD-10-CM | POA: Diagnosis not present

## 2017-12-11 DIAGNOSIS — E039 Hypothyroidism, unspecified: Secondary | ICD-10-CM | POA: Diagnosis not present

## 2017-12-12 ENCOUNTER — Other Ambulatory Visit: Payer: Self-pay

## 2017-12-12 ENCOUNTER — Other Ambulatory Visit: Payer: Self-pay | Admitting: Rheumatology

## 2017-12-12 DIAGNOSIS — E119 Type 2 diabetes mellitus without complications: Secondary | ICD-10-CM | POA: Diagnosis not present

## 2017-12-12 DIAGNOSIS — Z79899 Other long term (current) drug therapy: Secondary | ICD-10-CM

## 2017-12-12 DIAGNOSIS — M353 Polymyalgia rheumatica: Secondary | ICD-10-CM | POA: Diagnosis not present

## 2017-12-12 DIAGNOSIS — S32592D Other specified fracture of left pubis, subsequent encounter for fracture with routine healing: Secondary | ICD-10-CM | POA: Diagnosis not present

## 2017-12-12 DIAGNOSIS — F329 Major depressive disorder, single episode, unspecified: Secondary | ICD-10-CM | POA: Diagnosis not present

## 2017-12-12 DIAGNOSIS — E039 Hypothyroidism, unspecified: Secondary | ICD-10-CM | POA: Diagnosis not present

## 2017-12-12 DIAGNOSIS — Z9181 History of falling: Secondary | ICD-10-CM | POA: Diagnosis not present

## 2017-12-12 NOTE — Telephone Encounter (Signed)
Last Visit: 09/09/17 Next Visit: 02/10/18 Labs: 08/14/17 elevated glucose.   Patient advised she is due for labs. Patient will update today  Okay to refill supply per Dr. Estanislado Pandy

## 2017-12-12 NOTE — Telephone Encounter (Signed)
Patient came to the office to update labs

## 2017-12-13 LAB — CBC WITH DIFFERENTIAL/PLATELET
Basophils Absolute: 79 cells/uL (ref 0–200)
Basophils Relative: 0.6 %
Eosinophils Absolute: 26 cells/uL (ref 15–500)
Eosinophils Relative: 0.2 %
HCT: 36.6 % (ref 35.0–45.0)
Hemoglobin: 12 g/dL (ref 11.7–15.5)
Lymphs Abs: 1439 cells/uL (ref 850–3900)
MCH: 31.3 pg (ref 27.0–33.0)
MCHC: 32.8 g/dL (ref 32.0–36.0)
MCV: 95.3 fL (ref 80.0–100.0)
MPV: 10.4 fL (ref 7.5–12.5)
Monocytes Relative: 22.4 %
Neutro Abs: 8699 cells/uL — ABNORMAL HIGH (ref 1500–7800)
Neutrophils Relative %: 65.9 %
Platelets: 301 10*3/uL (ref 140–400)
RBC: 3.84 10*6/uL (ref 3.80–5.10)
RDW: 14 % (ref 11.0–15.0)
Total Lymphocyte: 10.9 %
WBC mixed population: 2957 cells/uL — ABNORMAL HIGH (ref 200–950)
WBC: 13.2 10*3/uL — ABNORMAL HIGH (ref 3.8–10.8)

## 2017-12-13 LAB — COMPLETE METABOLIC PANEL WITH GFR
AG Ratio: 1.3 (calc) (ref 1.0–2.5)
ALT: 14 U/L (ref 6–29)
AST: 19 U/L (ref 10–35)
Albumin: 4.4 g/dL (ref 3.6–5.1)
Alkaline phosphatase (APISO): 99 U/L (ref 33–130)
BUN/Creatinine Ratio: 16 (calc) (ref 6–22)
BUN: 15 mg/dL (ref 7–25)
CO2: 29 mmol/L (ref 20–32)
Calcium: 10.5 mg/dL — ABNORMAL HIGH (ref 8.6–10.4)
Chloride: 104 mmol/L (ref 98–110)
Creat: 0.93 mg/dL — ABNORMAL HIGH (ref 0.60–0.88)
GFR, Est African American: 64 mL/min/{1.73_m2} (ref >=60)
GFR, Est Non African American: 56 mL/min/{1.73_m2} — ABNORMAL LOW (ref >=60)
Globulin: 3.3 g/dL (calc) (ref 1.9–3.7)
Glucose, Bld: 170 mg/dL — ABNORMAL HIGH (ref 65–99)
Potassium: 5.8 mmol/L — ABNORMAL HIGH (ref 3.5–5.3)
Sodium: 140 mmol/L (ref 135–146)
Total Bilirubin: 1 mg/dL (ref 0.2–1.2)
Total Protein: 7.7 g/dL (ref 6.1–8.1)

## 2017-12-15 ENCOUNTER — Other Ambulatory Visit: Payer: Self-pay

## 2017-12-15 DIAGNOSIS — S32592D Other specified fracture of left pubis, subsequent encounter for fracture with routine healing: Secondary | ICD-10-CM | POA: Diagnosis not present

## 2017-12-15 DIAGNOSIS — E039 Hypothyroidism, unspecified: Secondary | ICD-10-CM | POA: Diagnosis not present

## 2017-12-15 DIAGNOSIS — M353 Polymyalgia rheumatica: Secondary | ICD-10-CM | POA: Diagnosis not present

## 2017-12-15 DIAGNOSIS — Z9181 History of falling: Secondary | ICD-10-CM | POA: Diagnosis not present

## 2017-12-15 DIAGNOSIS — F329 Major depressive disorder, single episode, unspecified: Secondary | ICD-10-CM | POA: Diagnosis not present

## 2017-12-15 DIAGNOSIS — E119 Type 2 diabetes mellitus without complications: Secondary | ICD-10-CM | POA: Diagnosis not present

## 2017-12-15 LAB — CUP PACEART REMOTE DEVICE CHECK
Date Time Interrogation Session: 20190805004133
Implantable Pulse Generator Implant Date: 20180525

## 2017-12-15 NOTE — Patient Outreach (Signed)
[facility]) Care Management  12/15/2017  Beverly Estrada [birth year] [id number]  EMMI: general discharge red alert Referral date: 12/15/17 Referral reason: read discharge papers; NO, know who to call about changes: no, new prescription: " I don't know", other questions/ problems: yes,  Lost interest in things: Yes Insurance: Medicare Day # 4  Telephone call to patient regarding EMMI general discharge red alert. HIPAA verified with patient. Explained reason for call.  Patient states she received a prescription at discharge from the skilled nursing facility for a medication that assist with her wetting herself. Patient reports she did not get the medication filled because it was $306.  Patient states she has gotten better since being discharged from the skilled facility and does not want to get the medication filled at this time. Patient states she has all of her other medications and takes them as prescribed. Patient reports she has a follow up appointment with her primary MD this week. She states she has good family support from 2 daughters and she has a person that assist her with errands/ transportation.  Patient states home health physical therapy services have started. Patient denies having any new symptoms or concerns. Patient states she has not lost interest in things. Patient states she takes a medication for anxiety / depression.  Patient reports receiving discharge instructions from both the hospital and skilled nursing facility. Patient denies any further needs.  RNCM advised patient to notify MD of any changes in condition prior to scheduled appointment. RNCM provided contact name and number: [phone] or main office number [phone] and 24 hour nurse advise line [phone] by mail.  RNCM verified patient aware of 911 services for urgent/ emergent needs.,  PLAN:  RNCM will close patient due to refusal of services.  RNCm will send patient [facility] care management  brochure/ magnet as discussed  RNCM, will send closure letter to patient's doctor.   Quinn Plowman RN,BSN,CCM [facility] Telephonic  [phone]

## 2017-12-15 NOTE — Progress Notes (Signed)
Glucose is elevated and WBC count is elevated most likely due to recent cortisone injection done by Dr. Ernestina Patches.

## 2017-12-17 ENCOUNTER — Encounter: Payer: Self-pay | Admitting: Cardiology

## 2017-12-17 DIAGNOSIS — F329 Major depressive disorder, single episode, unspecified: Secondary | ICD-10-CM | POA: Diagnosis not present

## 2017-12-17 DIAGNOSIS — E039 Hypothyroidism, unspecified: Secondary | ICD-10-CM | POA: Diagnosis not present

## 2017-12-17 DIAGNOSIS — E119 Type 2 diabetes mellitus without complications: Secondary | ICD-10-CM | POA: Diagnosis not present

## 2017-12-17 DIAGNOSIS — Z9181 History of falling: Secondary | ICD-10-CM | POA: Diagnosis not present

## 2017-12-17 DIAGNOSIS — M353 Polymyalgia rheumatica: Secondary | ICD-10-CM | POA: Diagnosis not present

## 2017-12-17 DIAGNOSIS — S32592D Other specified fracture of left pubis, subsequent encounter for fracture with routine healing: Secondary | ICD-10-CM | POA: Diagnosis not present

## 2017-12-17 NOTE — Progress Notes (Signed)
Letter  

## 2017-12-18 DIAGNOSIS — Z9181 History of falling: Secondary | ICD-10-CM | POA: Diagnosis not present

## 2017-12-18 DIAGNOSIS — E119 Type 2 diabetes mellitus without complications: Secondary | ICD-10-CM | POA: Diagnosis not present

## 2017-12-18 DIAGNOSIS — F329 Major depressive disorder, single episode, unspecified: Secondary | ICD-10-CM | POA: Diagnosis not present

## 2017-12-18 DIAGNOSIS — M353 Polymyalgia rheumatica: Secondary | ICD-10-CM | POA: Diagnosis not present

## 2017-12-18 DIAGNOSIS — S32592D Other specified fracture of left pubis, subsequent encounter for fracture with routine healing: Secondary | ICD-10-CM | POA: Diagnosis not present

## 2017-12-18 DIAGNOSIS — E039 Hypothyroidism, unspecified: Secondary | ICD-10-CM | POA: Diagnosis not present

## 2017-12-19 DIAGNOSIS — Z9181 History of falling: Secondary | ICD-10-CM | POA: Diagnosis not present

## 2017-12-19 DIAGNOSIS — M353 Polymyalgia rheumatica: Secondary | ICD-10-CM | POA: Diagnosis not present

## 2017-12-19 DIAGNOSIS — E119 Type 2 diabetes mellitus without complications: Secondary | ICD-10-CM | POA: Diagnosis not present

## 2017-12-19 DIAGNOSIS — E039 Hypothyroidism, unspecified: Secondary | ICD-10-CM | POA: Diagnosis not present

## 2017-12-19 DIAGNOSIS — F329 Major depressive disorder, single episode, unspecified: Secondary | ICD-10-CM | POA: Diagnosis not present

## 2017-12-19 DIAGNOSIS — S32592D Other specified fracture of left pubis, subsequent encounter for fracture with routine healing: Secondary | ICD-10-CM | POA: Diagnosis not present

## 2017-12-22 DIAGNOSIS — S32592D Other specified fracture of left pubis, subsequent encounter for fracture with routine healing: Secondary | ICD-10-CM | POA: Diagnosis not present

## 2017-12-22 DIAGNOSIS — E119 Type 2 diabetes mellitus without complications: Secondary | ICD-10-CM | POA: Diagnosis not present

## 2017-12-22 DIAGNOSIS — Z09 Encounter for follow-up examination after completed treatment for conditions other than malignant neoplasm: Secondary | ICD-10-CM | POA: Diagnosis not present

## 2017-12-22 DIAGNOSIS — Z6824 Body mass index (BMI) 24.0-24.9, adult: Secondary | ICD-10-CM | POA: Diagnosis not present

## 2017-12-22 DIAGNOSIS — E039 Hypothyroidism, unspecified: Secondary | ICD-10-CM | POA: Diagnosis not present

## 2017-12-22 DIAGNOSIS — S32509A Unspecified fracture of unspecified pubis, initial encounter for closed fracture: Secondary | ICD-10-CM | POA: Diagnosis not present

## 2017-12-22 DIAGNOSIS — F329 Major depressive disorder, single episode, unspecified: Secondary | ICD-10-CM | POA: Diagnosis not present

## 2017-12-22 DIAGNOSIS — M353 Polymyalgia rheumatica: Secondary | ICD-10-CM | POA: Diagnosis not present

## 2017-12-22 DIAGNOSIS — Z9181 History of falling: Secondary | ICD-10-CM | POA: Diagnosis not present

## 2017-12-23 DIAGNOSIS — E119 Type 2 diabetes mellitus without complications: Secondary | ICD-10-CM | POA: Diagnosis not present

## 2017-12-23 DIAGNOSIS — F329 Major depressive disorder, single episode, unspecified: Secondary | ICD-10-CM | POA: Diagnosis not present

## 2017-12-23 DIAGNOSIS — S32592D Other specified fracture of left pubis, subsequent encounter for fracture with routine healing: Secondary | ICD-10-CM | POA: Diagnosis not present

## 2017-12-23 DIAGNOSIS — M353 Polymyalgia rheumatica: Secondary | ICD-10-CM | POA: Diagnosis not present

## 2017-12-23 DIAGNOSIS — E039 Hypothyroidism, unspecified: Secondary | ICD-10-CM | POA: Diagnosis not present

## 2017-12-23 DIAGNOSIS — Z9181 History of falling: Secondary | ICD-10-CM | POA: Diagnosis not present

## 2017-12-25 DIAGNOSIS — M353 Polymyalgia rheumatica: Secondary | ICD-10-CM | POA: Diagnosis not present

## 2017-12-25 DIAGNOSIS — E119 Type 2 diabetes mellitus without complications: Secondary | ICD-10-CM | POA: Diagnosis not present

## 2017-12-25 DIAGNOSIS — E039 Hypothyroidism, unspecified: Secondary | ICD-10-CM | POA: Diagnosis not present

## 2017-12-25 DIAGNOSIS — F329 Major depressive disorder, single episode, unspecified: Secondary | ICD-10-CM | POA: Diagnosis not present

## 2017-12-25 DIAGNOSIS — S32592D Other specified fracture of left pubis, subsequent encounter for fracture with routine healing: Secondary | ICD-10-CM | POA: Diagnosis not present

## 2017-12-25 DIAGNOSIS — Z9181 History of falling: Secondary | ICD-10-CM | POA: Diagnosis not present

## 2017-12-26 DIAGNOSIS — M353 Polymyalgia rheumatica: Secondary | ICD-10-CM | POA: Diagnosis not present

## 2017-12-26 DIAGNOSIS — F329 Major depressive disorder, single episode, unspecified: Secondary | ICD-10-CM | POA: Diagnosis not present

## 2017-12-26 DIAGNOSIS — E119 Type 2 diabetes mellitus without complications: Secondary | ICD-10-CM | POA: Diagnosis not present

## 2017-12-26 DIAGNOSIS — Z9181 History of falling: Secondary | ICD-10-CM | POA: Diagnosis not present

## 2017-12-26 DIAGNOSIS — E039 Hypothyroidism, unspecified: Secondary | ICD-10-CM | POA: Diagnosis not present

## 2017-12-26 DIAGNOSIS — S32592D Other specified fracture of left pubis, subsequent encounter for fracture with routine healing: Secondary | ICD-10-CM | POA: Diagnosis not present

## 2017-12-26 LAB — CUP PACEART REMOTE DEVICE CHECK
Date Time Interrogation Session: 20190907013804
Implantable Pulse Generator Implant Date: 20180525

## 2017-12-29 DIAGNOSIS — M353 Polymyalgia rheumatica: Secondary | ICD-10-CM | POA: Diagnosis not present

## 2017-12-29 DIAGNOSIS — S32592D Other specified fracture of left pubis, subsequent encounter for fracture with routine healing: Secondary | ICD-10-CM | POA: Diagnosis not present

## 2017-12-29 DIAGNOSIS — E039 Hypothyroidism, unspecified: Secondary | ICD-10-CM | POA: Diagnosis not present

## 2017-12-30 DIAGNOSIS — E119 Type 2 diabetes mellitus without complications: Secondary | ICD-10-CM | POA: Diagnosis not present

## 2017-12-30 DIAGNOSIS — S32592D Other specified fracture of left pubis, subsequent encounter for fracture with routine healing: Secondary | ICD-10-CM | POA: Diagnosis not present

## 2017-12-30 DIAGNOSIS — E039 Hypothyroidism, unspecified: Secondary | ICD-10-CM | POA: Diagnosis not present

## 2017-12-30 DIAGNOSIS — M353 Polymyalgia rheumatica: Secondary | ICD-10-CM | POA: Diagnosis not present

## 2017-12-30 DIAGNOSIS — Z9181 History of falling: Secondary | ICD-10-CM | POA: Diagnosis not present

## 2017-12-30 DIAGNOSIS — F329 Major depressive disorder, single episode, unspecified: Secondary | ICD-10-CM | POA: Diagnosis not present

## 2017-12-31 DIAGNOSIS — M1711 Unilateral primary osteoarthritis, right knee: Secondary | ICD-10-CM | POA: Diagnosis not present

## 2018-01-05 DIAGNOSIS — Z0001 Encounter for general adult medical examination with abnormal findings: Secondary | ICD-10-CM | POA: Diagnosis not present

## 2018-01-05 DIAGNOSIS — Z1331 Encounter for screening for depression: Secondary | ICD-10-CM | POA: Diagnosis not present

## 2018-01-05 DIAGNOSIS — Z23 Encounter for immunization: Secondary | ICD-10-CM | POA: Diagnosis not present

## 2018-01-05 DIAGNOSIS — Z1239 Encounter for other screening for malignant neoplasm of breast: Secondary | ICD-10-CM | POA: Diagnosis not present

## 2018-01-05 DIAGNOSIS — Z1339 Encounter for screening examination for other mental health and behavioral disorders: Secondary | ICD-10-CM | POA: Diagnosis not present

## 2018-01-07 DIAGNOSIS — M2560 Stiffness of unspecified joint, not elsewhere classified: Secondary | ICD-10-CM | POA: Diagnosis not present

## 2018-01-07 DIAGNOSIS — R293 Abnormal posture: Secondary | ICD-10-CM | POA: Diagnosis not present

## 2018-01-07 DIAGNOSIS — M6281 Muscle weakness (generalized): Secondary | ICD-10-CM | POA: Diagnosis not present

## 2018-01-07 DIAGNOSIS — R2689 Other abnormalities of gait and mobility: Secondary | ICD-10-CM | POA: Diagnosis not present

## 2018-01-07 DIAGNOSIS — M545 Low back pain: Secondary | ICD-10-CM | POA: Diagnosis not present

## 2018-01-07 DIAGNOSIS — S32509D Unspecified fracture of unspecified pubis, subsequent encounter for fracture with routine healing: Secondary | ICD-10-CM | POA: Diagnosis not present

## 2018-01-07 DIAGNOSIS — R2681 Unsteadiness on feet: Secondary | ICD-10-CM | POA: Diagnosis not present

## 2018-01-07 DIAGNOSIS — M25552 Pain in left hip: Secondary | ICD-10-CM | POA: Diagnosis not present

## 2018-01-08 ENCOUNTER — Ambulatory Visit (INDEPENDENT_AMBULATORY_CARE_PROVIDER_SITE_OTHER): Payer: Medicare Other | Admitting: *Deleted

## 2018-01-08 DIAGNOSIS — I639 Cerebral infarction, unspecified: Secondary | ICD-10-CM

## 2018-01-08 NOTE — Progress Notes (Signed)
Carelink Summary Report / Loop Recorder 

## 2018-01-13 DIAGNOSIS — M2560 Stiffness of unspecified joint, not elsewhere classified: Secondary | ICD-10-CM | POA: Diagnosis not present

## 2018-01-13 DIAGNOSIS — R2689 Other abnormalities of gait and mobility: Secondary | ICD-10-CM | POA: Diagnosis not present

## 2018-01-13 DIAGNOSIS — M6281 Muscle weakness (generalized): Secondary | ICD-10-CM | POA: Diagnosis not present

## 2018-01-13 DIAGNOSIS — S32509D Unspecified fracture of unspecified pubis, subsequent encounter for fracture with routine healing: Secondary | ICD-10-CM | POA: Diagnosis not present

## 2018-01-13 DIAGNOSIS — M25552 Pain in left hip: Secondary | ICD-10-CM | POA: Diagnosis not present

## 2018-01-13 DIAGNOSIS — M545 Low back pain: Secondary | ICD-10-CM | POA: Diagnosis not present

## 2018-01-15 DIAGNOSIS — M25552 Pain in left hip: Secondary | ICD-10-CM | POA: Diagnosis not present

## 2018-01-15 DIAGNOSIS — M545 Low back pain: Secondary | ICD-10-CM | POA: Diagnosis not present

## 2018-01-15 DIAGNOSIS — M2560 Stiffness of unspecified joint, not elsewhere classified: Secondary | ICD-10-CM | POA: Diagnosis not present

## 2018-01-15 DIAGNOSIS — M6281 Muscle weakness (generalized): Secondary | ICD-10-CM | POA: Diagnosis not present

## 2018-01-15 DIAGNOSIS — S32509D Unspecified fracture of unspecified pubis, subsequent encounter for fracture with routine healing: Secondary | ICD-10-CM | POA: Diagnosis not present

## 2018-01-15 DIAGNOSIS — R2689 Other abnormalities of gait and mobility: Secondary | ICD-10-CM | POA: Diagnosis not present

## 2018-01-20 DIAGNOSIS — M2560 Stiffness of unspecified joint, not elsewhere classified: Secondary | ICD-10-CM | POA: Diagnosis not present

## 2018-01-20 DIAGNOSIS — M545 Low back pain: Secondary | ICD-10-CM | POA: Diagnosis not present

## 2018-01-20 DIAGNOSIS — R2689 Other abnormalities of gait and mobility: Secondary | ICD-10-CM | POA: Diagnosis not present

## 2018-01-20 DIAGNOSIS — S32509D Unspecified fracture of unspecified pubis, subsequent encounter for fracture with routine healing: Secondary | ICD-10-CM | POA: Diagnosis not present

## 2018-01-20 DIAGNOSIS — M6281 Muscle weakness (generalized): Secondary | ICD-10-CM | POA: Diagnosis not present

## 2018-01-20 DIAGNOSIS — M25552 Pain in left hip: Secondary | ICD-10-CM | POA: Diagnosis not present

## 2018-01-22 DIAGNOSIS — M25552 Pain in left hip: Secondary | ICD-10-CM | POA: Diagnosis not present

## 2018-01-22 DIAGNOSIS — M545 Low back pain: Secondary | ICD-10-CM | POA: Diagnosis not present

## 2018-01-22 DIAGNOSIS — S32509D Unspecified fracture of unspecified pubis, subsequent encounter for fracture with routine healing: Secondary | ICD-10-CM | POA: Diagnosis not present

## 2018-01-22 DIAGNOSIS — M2560 Stiffness of unspecified joint, not elsewhere classified: Secondary | ICD-10-CM | POA: Diagnosis not present

## 2018-01-22 DIAGNOSIS — R2689 Other abnormalities of gait and mobility: Secondary | ICD-10-CM | POA: Diagnosis not present

## 2018-01-22 DIAGNOSIS — M6281 Muscle weakness (generalized): Secondary | ICD-10-CM | POA: Diagnosis not present

## 2018-01-23 LAB — CUP PACEART REMOTE DEVICE CHECK
Date Time Interrogation Session: 20191010014107
Implantable Pulse Generator Implant Date: 20180525

## 2018-01-27 DIAGNOSIS — M2560 Stiffness of unspecified joint, not elsewhere classified: Secondary | ICD-10-CM | POA: Diagnosis not present

## 2018-01-27 DIAGNOSIS — R2689 Other abnormalities of gait and mobility: Secondary | ICD-10-CM | POA: Diagnosis not present

## 2018-01-27 DIAGNOSIS — M25552 Pain in left hip: Secondary | ICD-10-CM | POA: Diagnosis not present

## 2018-01-27 DIAGNOSIS — M6281 Muscle weakness (generalized): Secondary | ICD-10-CM | POA: Diagnosis not present

## 2018-01-27 DIAGNOSIS — S32509D Unspecified fracture of unspecified pubis, subsequent encounter for fracture with routine healing: Secondary | ICD-10-CM | POA: Diagnosis not present

## 2018-01-27 DIAGNOSIS — M545 Low back pain: Secondary | ICD-10-CM | POA: Diagnosis not present

## 2018-01-27 NOTE — Progress Notes (Addendum)
Office Visit Note  Patient: Beverly Estrada             Date of Birth: [birth year]           MRN: [id number]             PCP: Ernestene Kiel, MD Referring: Ernestene Kiel, MD Visit Date: 02/10/2018 Occupation: @GUAROCC @  Subjective:  Right hip pain.  History of Present Illness: Beverly Estrada is a 84 y.o. female with history of seronegative arthritis, PMR, DDD of lumbar, and osteoarthritis.  According to patient her symptoms are quite well controlled on methotrexate.  She denies any joint pain or joint swelling.  She has been having some discomfort in her right trochanteric area off and on.  She states she is currently having some discomfort in the right trochanteric area.  The trigger finger in her left hand is resolved.  She continues to have some discomfort in her knee joints.  The feet discomfort has improved.  For her lower back pain she has been getting injections by Dr. Ernestina Patches.  She is concerned about the elevated blood glucose level.  There is no increased muscle weakness or tenderness.  She has been tolerating methotrexate well.  She denies any joint swelling.  Activities of Daily Living:  Patient reports morning stiffness for 5 minute.   Patient Denies nocturnal pain.  Difficulty dressing/grooming: Denies Difficulty climbing stairs: Reports Difficulty getting out of chair: Reports Difficulty using hands for taps, buttons, cutlery, and/or writing: Denies  Review of Systems  Constitutional: Positive for fatigue. Negative for night sweats, weight gain and weight loss.  HENT: Positive for mouth dryness. Negative for mouth sores, trouble swallowing, trouble swallowing and nose dryness.   Eyes: Positive for dryness. Negative for pain, redness and visual disturbance.  Respiratory: Negative for cough, shortness of breath and difficulty breathing.   Cardiovascular: Negative for chest pain, palpitations, hypertension, irregular heartbeat and swelling in legs/feet.    Gastrointestinal: Negative for blood in stool, constipation and diarrhea.  Endocrine: Negative for increased urination.  Genitourinary: Negative for vaginal dryness.  Musculoskeletal: Positive for arthralgias, joint pain, myalgias, morning stiffness and myalgias. Negative for joint swelling, muscle weakness and muscle tenderness.  Skin: Negative for color change, rash, hair loss, skin tightness, ulcers and sensitivity to sunlight.  Allergic/Immunologic: Negative for susceptible to infections.  Neurological: Negative for dizziness, memory loss, night sweats and weakness.  Hematological: Negative for swollen glands.  Psychiatric/Behavioral: Positive for depressed mood. Negative for sleep disturbance. The patient is not nervous/anxious.     PMFS History:  Patient Active Problem List   Diagnosis Date Noted  . Trigger index finger of left hand 09/16/2017  . History of stroke 05/01/2017  . Hyperlipidemia 05/01/2017  . Vision loss of right eye 10/07/2016  . Medication monitoring encounter 07/02/2016  . Trochanteric bursitis of both hips 07/02/2016  . High risk medication use 07/02/2016  . Osteopenia 02/01/2016  . RA (rheumatoid arthritis) ([place]) 01/30/2016  . Osteoarthritis of both knees 01/30/2016  . Osteoarthritis of both feet 01/30/2016  . DDD (degenerative disc disease), lumbar 01/30/2016  . Primary osteoarthritis of right hip 01/30/2016  . Depression 01/30/2016  . Choledocholithiasis 03/13/2015  . Hypothyroidism 03/13/2015  . Polymyalgia rheumatica ([place]) 03/13/2015    Past Medical History:  Diagnosis Date  . Arthritis   . DDD (degenerative disc disease), lumbar 01/30/2016  . Glaucoma   . Hypothyroidism   . Multiple drug allergies    to multiple antibiotics.   . Osteoarthritis of  both feet 01/30/2016  . Osteoarthritis of both hands 01/30/2016  . Osteoarthritis of both knees 01/30/2016  . Osteoarthritis of right hand 01/30/2016   moderate  . Polymyalgia rheumatica ([place])   .  RA (rheumatoid arthritis) ([place]) 01/30/2016   Sero Negative  . Skin cancer of nose   . Stroke [facility])    right eye     Family History  Problem Relation Age of Onset  . Diabetes Mellitus II Mother   . Stroke Mother   . Diabetes Mellitus II Brother   . Stroke Brother   . Diabetes Brother   . Diabetes Sister   . Diabetes Sister   . Diabetes Sister   . Diabetes Sister   . Diabetes Brother   . Diabetes Brother   . Diabetes Brother   . Diabetes Brother   . Diabetes Brother    Past Surgical History:  Procedure Laterality Date  . APPENDECTOMY    . CHOLECYSTECTOMY    . COLONOSCOPY  ~2013   pooyps removed, one of them large per pt. Polyp pathology unclear. no rescreening study planned due to age  . LOOP RECORDER INSERTION N/A 08/23/2016   Procedure: Loop Recorder Insertion;  Surgeon: Thompson Grayer, MD;  Location: [facility];  Service: Cardiovascular;  Laterality: N/A;  . TEE WITHOUT CARDIOVERSION N/A 08/23/2016   Procedure: TRANSESOPHAGEAL ECHOCARDIOGRAM (TEE);  Surgeon: Larey Dresser, MD;  Location: [facility] ENDOSCOPY;  Service: Cardiovascular;  Laterality: N/A;  . TONSIL SURGERY     Social History   Social History Narrative  . Not on file   Immunization History  Administered Date(s) Administered  . Pneumococcal Conjugate-13 09/13/2014, 01/23/2017  . Pneumococcal Polysaccharide-23 04/02/1999  . Tdap 09/18/2009  . Zoster 08/06/2007   Objective: Vital Signs: BP 123/71 (BP Location: Left Arm, Patient Position: Sitting, Cuff Size: Normal)   Pulse (!) 59   Resp 13   Ht 5\' 5"  (1.651 m)   Wt 147 lb (66.7 kg)   BMI 24.46 kg/m    Physical Exam  Constitutional: She is oriented to person, place, and time. She appears well-developed and well-nourished.  HENT:  Head: Normocephalic and atraumatic.  Eyes: Conjunctivae and EOM are normal.  Neck: Normal range of motion.  Cardiovascular: Normal rate, regular rhythm, normal heart sounds and intact distal pulses.  Pulmonary/Chest:  Effort normal and breath sounds normal.  Abdominal: Soft. Bowel sounds are normal.  Lymphadenopathy:    She has no cervical adenopathy.  Neurological: She is alert and oriented to person, place, and time.  Skin: Skin is warm and dry. Capillary refill takes less than 2 seconds.  Psychiatric: She has a normal mood and affect. Her behavior is normal.  Nursing note and vitals reviewed.    Musculoskeletal Exam: C-spine and thoracic spine limited range of motion.  She has discomfort range of motion of her lumbar spine.  Shoulder joints elbow joints wrist joints MCPs PIPs were in good range of motion.  No synovitis was noted.  She has DIP and PIP thickening in her hands and feet.  She has good range of motion of her hip joints and knee joints without much discomfort warmth or swelling.  She had no muscular weakness or tenderness on examination.  CDAI Exam: CDAI Score: 0.4  Patient Global Assessment: 2 (mm); Provider Global Assessment: 2 (mm) Swollen: 0 ; Tender: 0  Joint Exam   Not documented   There is currently no information documented on the homunculus. Go to the Rheumatology activity and complete the  homunculus joint exam.  Investigation: No additional findings.  Imaging: No results found.  Recent Labs: Lab Results  Component Value Date   WBC 13.2 (H) 12/12/2017   HGB 12.0 12/12/2017   PLT 301 12/12/2017   NA 140 12/12/2017   K 5.8 (H) 12/12/2017   CL 104 12/12/2017   CO2 29 12/12/2017   GLUCOSE 170 (H) 12/12/2017   BUN 15 12/12/2017   CREATININE 0.93 (H) 12/12/2017   BILITOT 1.0 12/12/2017   ALKPHOS 56 10/08/2016   AST 19 12/12/2017   ALT 14 12/12/2017   PROT 7.7 12/12/2017   ALBUMIN 4.2 10/08/2016   CALCIUM 10.5 (H) 12/12/2017   GFRAA 64 12/12/2017    Speciality Comments: No specialty comments available.  Procedures:  No procedures performed Allergies: Biaxin [clarithromycin]; Augmentin [amoxicillin-pot clavulanate]; and Clindamycin/lincomycin   Assessment /  Plan:     Visit Diagnoses: Rheumatoid arthritis of multiple sites with negative rheumatoid factor (HCC)-patient had no synovitis on examination.  She continues to have some arthralgias due to osteoarthritis.  She has been tolerating methotrexate well.  High risk medication use - Current regimen includes methotrexate 7.5 mg po weekly, folic acid 1 mg po daily, and fish oil 1000 mg po daily.  Most recent CBC/CMP stable on 12/12/17. Next CBC/CMP due in December and then every 3 months to monitor for drug toxicity.  Standing orders are in place.   Recommend annual flu and Shingrix vaccine as indicated.  She has elevated glucose which could be related to the steroid injections.  Have advised her to follow-up with her PCP regarding elevated glucose levels.  S  Primary osteoarthritis of right hip-she has good range of motion.  Primary osteoarthritis of both knees-she is currently not having any discomfort or swelling.  Primary osteoarthritis of both feet-proper fitting shoes were discussed.  DDD (degenerative disc disease), lumbar - She has seen Dr. Ernestina Patches in the past for injections.  She continues to have lower back pain.  I will refer her to physical therapy for lower back pain due to degenerative disc disease.  She is also had history of fall.  Lower extremity muscle strengthening for fall prevention was added to physical therapy.  Polymyalgia rheumatica (HCC)-patient has no muscular weakness or tenderness on examination.  She appears to be in remission.  Osteopenia of multiple sites - DXA is  done by her PCP.  History of depression-related to her eye issues.  History of stroke - With subacute memory loss followed up by Dr. Leonie Man.  Vision loss of right eye  History of hypothyroidism   Orders: No orders of the defined types were placed in this encounter.  No orders of the defined types were placed in this encounter.   Face-to-face time spent with patient was 30 minutes. Greater than 50% of  time was spent in counseling and coordination of care.  Follow-Up Instructions: Return in about 5 months (around 07/12/2018) for Osteoarthritis, Rheumatoid arthritis.   Bo Merino, MD  Note - This record has been created using Editor, commissioning.  Chart creation errors have been sought, but may not always  have been located. Such creation errors do not reflect on  the standard of medical care.

## 2018-01-29 DIAGNOSIS — M2560 Stiffness of unspecified joint, not elsewhere classified: Secondary | ICD-10-CM | POA: Diagnosis not present

## 2018-01-29 DIAGNOSIS — M25552 Pain in left hip: Secondary | ICD-10-CM | POA: Diagnosis not present

## 2018-01-29 DIAGNOSIS — S32509D Unspecified fracture of unspecified pubis, subsequent encounter for fracture with routine healing: Secondary | ICD-10-CM | POA: Diagnosis not present

## 2018-01-29 DIAGNOSIS — M545 Low back pain: Secondary | ICD-10-CM | POA: Diagnosis not present

## 2018-01-29 DIAGNOSIS — M6281 Muscle weakness (generalized): Secondary | ICD-10-CM | POA: Diagnosis not present

## 2018-01-29 DIAGNOSIS — R2689 Other abnormalities of gait and mobility: Secondary | ICD-10-CM | POA: Diagnosis not present

## 2018-02-02 DIAGNOSIS — C44311 Basal cell carcinoma of skin of nose: Secondary | ICD-10-CM | POA: Diagnosis not present

## 2018-02-02 DIAGNOSIS — C44222 Squamous cell carcinoma of skin of right ear and external auricular canal: Secondary | ICD-10-CM | POA: Diagnosis not present

## 2018-02-03 DIAGNOSIS — S32509D Unspecified fracture of unspecified pubis, subsequent encounter for fracture with routine healing: Secondary | ICD-10-CM | POA: Diagnosis not present

## 2018-02-03 DIAGNOSIS — M6281 Muscle weakness (generalized): Secondary | ICD-10-CM | POA: Diagnosis not present

## 2018-02-03 DIAGNOSIS — R413 Other amnesia: Secondary | ICD-10-CM | POA: Diagnosis not present

## 2018-02-03 DIAGNOSIS — R4701 Aphasia: Secondary | ICD-10-CM | POA: Diagnosis not present

## 2018-02-03 DIAGNOSIS — M25552 Pain in left hip: Secondary | ICD-10-CM | POA: Diagnosis not present

## 2018-02-03 DIAGNOSIS — R293 Abnormal posture: Secondary | ICD-10-CM | POA: Diagnosis not present

## 2018-02-03 DIAGNOSIS — M545 Low back pain: Secondary | ICD-10-CM | POA: Diagnosis not present

## 2018-02-03 DIAGNOSIS — R2689 Other abnormalities of gait and mobility: Secondary | ICD-10-CM | POA: Diagnosis not present

## 2018-02-03 DIAGNOSIS — R2681 Unsteadiness on feet: Secondary | ICD-10-CM | POA: Diagnosis not present

## 2018-02-03 DIAGNOSIS — M2560 Stiffness of unspecified joint, not elsewhere classified: Secondary | ICD-10-CM | POA: Diagnosis not present

## 2018-02-04 DIAGNOSIS — H02052 Trichiasis without entropian right lower eyelid: Secondary | ICD-10-CM | POA: Diagnosis not present

## 2018-02-04 DIAGNOSIS — H35351 Cystoid macular degeneration, right eye: Secondary | ICD-10-CM | POA: Diagnosis not present

## 2018-02-04 DIAGNOSIS — E113291 Type 2 diabetes mellitus with mild nonproliferative diabetic retinopathy without macular edema, right eye: Secondary | ICD-10-CM | POA: Diagnosis not present

## 2018-02-04 DIAGNOSIS — H35372 Puckering of macula, left eye: Secondary | ICD-10-CM | POA: Diagnosis not present

## 2018-02-05 DIAGNOSIS — R413 Other amnesia: Secondary | ICD-10-CM | POA: Diagnosis not present

## 2018-02-05 DIAGNOSIS — R4701 Aphasia: Secondary | ICD-10-CM | POA: Diagnosis not present

## 2018-02-05 DIAGNOSIS — S32509D Unspecified fracture of unspecified pubis, subsequent encounter for fracture with routine healing: Secondary | ICD-10-CM | POA: Diagnosis not present

## 2018-02-05 DIAGNOSIS — M545 Low back pain: Secondary | ICD-10-CM | POA: Diagnosis not present

## 2018-02-05 DIAGNOSIS — M25552 Pain in left hip: Secondary | ICD-10-CM | POA: Diagnosis not present

## 2018-02-05 DIAGNOSIS — M6281 Muscle weakness (generalized): Secondary | ICD-10-CM | POA: Diagnosis not present

## 2018-02-09 ENCOUNTER — Ambulatory Visit (INDEPENDENT_AMBULATORY_CARE_PROVIDER_SITE_OTHER): Payer: Medicare Other | Admitting: *Deleted

## 2018-02-09 DIAGNOSIS — I639 Cerebral infarction, unspecified: Secondary | ICD-10-CM | POA: Diagnosis not present

## 2018-02-10 ENCOUNTER — Ambulatory Visit (INDEPENDENT_AMBULATORY_CARE_PROVIDER_SITE_OTHER): Payer: Medicare Other | Admitting: Rheumatology

## 2018-02-10 ENCOUNTER — Encounter: Payer: Self-pay | Admitting: Rheumatology

## 2018-02-10 VITALS — BP 123/71 | HR 59 | Resp 13 | Ht 65.0 in | Wt 147.0 lb

## 2018-02-10 DIAGNOSIS — I639 Cerebral infarction, unspecified: Secondary | ICD-10-CM

## 2018-02-10 DIAGNOSIS — Z8659 Personal history of other mental and behavioral disorders: Secondary | ICD-10-CM | POA: Diagnosis not present

## 2018-02-10 DIAGNOSIS — M170 Bilateral primary osteoarthritis of knee: Secondary | ICD-10-CM | POA: Diagnosis not present

## 2018-02-10 DIAGNOSIS — H5461 Unqualified visual loss, right eye, normal vision left eye: Secondary | ICD-10-CM | POA: Diagnosis not present

## 2018-02-10 DIAGNOSIS — Z8639 Personal history of other endocrine, nutritional and metabolic disease: Secondary | ICD-10-CM | POA: Diagnosis not present

## 2018-02-10 DIAGNOSIS — M19071 Primary osteoarthritis, right ankle and foot: Secondary | ICD-10-CM | POA: Diagnosis not present

## 2018-02-10 DIAGNOSIS — M1611 Unilateral primary osteoarthritis, right hip: Secondary | ICD-10-CM

## 2018-02-10 DIAGNOSIS — M0609 Rheumatoid arthritis without rheumatoid factor, multiple sites: Secondary | ICD-10-CM

## 2018-02-10 DIAGNOSIS — M5136 Other intervertebral disc degeneration, lumbar region: Secondary | ICD-10-CM | POA: Diagnosis not present

## 2018-02-10 DIAGNOSIS — M353 Polymyalgia rheumatica: Secondary | ICD-10-CM

## 2018-02-10 DIAGNOSIS — Z79899 Other long term (current) drug therapy: Secondary | ICD-10-CM | POA: Diagnosis not present

## 2018-02-10 DIAGNOSIS — Z8673 Personal history of transient ischemic attack (TIA), and cerebral infarction without residual deficits: Secondary | ICD-10-CM

## 2018-02-10 DIAGNOSIS — M19072 Primary osteoarthritis, left ankle and foot: Secondary | ICD-10-CM

## 2018-02-10 DIAGNOSIS — M8589 Other specified disorders of bone density and structure, multiple sites: Secondary | ICD-10-CM | POA: Diagnosis not present

## 2018-02-10 NOTE — Patient Instructions (Signed)
Standing Labs We placed an order today for your standing lab work.    Please come back and get your standing labs in December and every 3 months   We have open lab Monday through Friday from 8:30-11:30 AM and 1:30-4:00 PM  at the office of Dr. Essance Gatti.   You may experience shorter wait times on Monday and Friday afternoons. The office is located at [address], [address], [place], [place] [address] No appointment is necessary.   Labs are drawn by [facility].  You may receive a bill from [facility] for your lab work. If you have any questions regarding directions or hours of operation,  please call [phone].   Just as a reminder please drink plenty of water prior to coming for your lab work. Thanks!   

## 2018-02-10 NOTE — Progress Notes (Signed)
Carelink Summary Report / Loop Recorder 

## 2018-02-10 NOTE — Addendum Note (Signed)
Addended by: Carole Binning on: 02/10/2018 11:26 AM   Modules accepted: Orders

## 2018-02-11 DIAGNOSIS — M545 Low back pain: Secondary | ICD-10-CM | POA: Diagnosis not present

## 2018-02-11 DIAGNOSIS — M6281 Muscle weakness (generalized): Secondary | ICD-10-CM | POA: Diagnosis not present

## 2018-02-11 DIAGNOSIS — S32509D Unspecified fracture of unspecified pubis, subsequent encounter for fracture with routine healing: Secondary | ICD-10-CM | POA: Diagnosis not present

## 2018-02-11 DIAGNOSIS — R413 Other amnesia: Secondary | ICD-10-CM | POA: Diagnosis not present

## 2018-02-11 DIAGNOSIS — R4701 Aphasia: Secondary | ICD-10-CM | POA: Diagnosis not present

## 2018-02-11 DIAGNOSIS — M25552 Pain in left hip: Secondary | ICD-10-CM | POA: Diagnosis not present

## 2018-02-12 DIAGNOSIS — M545 Low back pain: Secondary | ICD-10-CM | POA: Diagnosis not present

## 2018-02-12 DIAGNOSIS — M25552 Pain in left hip: Secondary | ICD-10-CM | POA: Diagnosis not present

## 2018-02-12 DIAGNOSIS — S32509D Unspecified fracture of unspecified pubis, subsequent encounter for fracture with routine healing: Secondary | ICD-10-CM | POA: Diagnosis not present

## 2018-02-12 DIAGNOSIS — R413 Other amnesia: Secondary | ICD-10-CM | POA: Diagnosis not present

## 2018-02-12 DIAGNOSIS — R4701 Aphasia: Secondary | ICD-10-CM | POA: Diagnosis not present

## 2018-02-12 DIAGNOSIS — M6281 Muscle weakness (generalized): Secondary | ICD-10-CM | POA: Diagnosis not present

## 2018-02-13 DIAGNOSIS — E663 Overweight: Secondary | ICD-10-CM | POA: Diagnosis not present

## 2018-02-13 DIAGNOSIS — E1169 Type 2 diabetes mellitus with other specified complication: Secondary | ICD-10-CM | POA: Diagnosis not present

## 2018-02-13 DIAGNOSIS — E039 Hypothyroidism, unspecified: Secondary | ICD-10-CM | POA: Diagnosis not present

## 2018-02-13 DIAGNOSIS — Z6825 Body mass index (BMI) 25.0-25.9, adult: Secondary | ICD-10-CM | POA: Diagnosis not present

## 2018-02-13 DIAGNOSIS — M353 Polymyalgia rheumatica: Secondary | ICD-10-CM | POA: Diagnosis not present

## 2018-02-13 DIAGNOSIS — R413 Other amnesia: Secondary | ICD-10-CM | POA: Diagnosis not present

## 2018-02-13 DIAGNOSIS — Z79899 Other long term (current) drug therapy: Secondary | ICD-10-CM | POA: Diagnosis not present

## 2018-02-13 DIAGNOSIS — M545 Low back pain: Secondary | ICD-10-CM | POA: Diagnosis not present

## 2018-02-17 ENCOUNTER — Other Ambulatory Visit: Payer: Self-pay

## 2018-02-17 DIAGNOSIS — M545 Low back pain: Secondary | ICD-10-CM | POA: Diagnosis not present

## 2018-02-17 DIAGNOSIS — R4701 Aphasia: Secondary | ICD-10-CM | POA: Diagnosis not present

## 2018-02-17 DIAGNOSIS — S32509D Unspecified fracture of unspecified pubis, subsequent encounter for fracture with routine healing: Secondary | ICD-10-CM | POA: Diagnosis not present

## 2018-02-17 DIAGNOSIS — R413 Other amnesia: Secondary | ICD-10-CM | POA: Diagnosis not present

## 2018-02-17 DIAGNOSIS — M25552 Pain in left hip: Secondary | ICD-10-CM | POA: Diagnosis not present

## 2018-02-17 DIAGNOSIS — M6281 Muscle weakness (generalized): Secondary | ICD-10-CM | POA: Diagnosis not present

## 2018-02-19 DIAGNOSIS — H34811 Central retinal vein occlusion, right eye, with macular edema: Secondary | ICD-10-CM | POA: Diagnosis not present

## 2018-02-19 DIAGNOSIS — H35373 Puckering of macula, bilateral: Secondary | ICD-10-CM | POA: Diagnosis not present

## 2018-02-21 DIAGNOSIS — Z9181 History of falling: Secondary | ICD-10-CM | POA: Diagnosis not present

## 2018-02-21 DIAGNOSIS — S62201D Unspecified fracture of first metacarpal bone, right hand, subsequent encounter for fracture with routine healing: Secondary | ICD-10-CM | POA: Diagnosis not present

## 2018-02-21 DIAGNOSIS — K635 Polyp of colon: Secondary | ICD-10-CM | POA: Diagnosis not present

## 2018-02-21 DIAGNOSIS — M25462 Effusion, left knee: Secondary | ICD-10-CM | POA: Diagnosis not present

## 2018-02-21 DIAGNOSIS — Z7982 Long term (current) use of aspirin: Secondary | ICD-10-CM | POA: Diagnosis not present

## 2018-02-21 DIAGNOSIS — M2041 Other hammer toe(s) (acquired), right foot: Secondary | ICD-10-CM | POA: Diagnosis not present

## 2018-02-21 DIAGNOSIS — Z7902 Long term (current) use of antithrombotics/antiplatelets: Secondary | ICD-10-CM | POA: Diagnosis not present

## 2018-02-21 DIAGNOSIS — E039 Hypothyroidism, unspecified: Secondary | ICD-10-CM | POA: Diagnosis not present

## 2018-02-21 DIAGNOSIS — Z741 Need for assistance with personal care: Secondary | ICD-10-CM | POA: Diagnosis not present

## 2018-02-21 DIAGNOSIS — D649 Anemia, unspecified: Secondary | ICD-10-CM | POA: Diagnosis not present

## 2018-02-21 DIAGNOSIS — S066X0A Traumatic subarachnoid hemorrhage without loss of consciousness, initial encounter: Secondary | ICD-10-CM | POA: Diagnosis not present

## 2018-02-21 DIAGNOSIS — Z79899 Other long term (current) drug therapy: Secondary | ICD-10-CM | POA: Diagnosis not present

## 2018-02-21 DIAGNOSIS — R2681 Unsteadiness on feet: Secondary | ICD-10-CM | POA: Diagnosis not present

## 2018-02-21 DIAGNOSIS — W1839XA Other fall on same level, initial encounter: Secondary | ICD-10-CM | POA: Diagnosis not present

## 2018-02-21 DIAGNOSIS — W19XXXA Unspecified fall, initial encounter: Secondary | ICD-10-CM | POA: Diagnosis not present

## 2018-02-21 DIAGNOSIS — I618 Other nontraumatic intracerebral hemorrhage: Secondary | ICD-10-CM | POA: Diagnosis not present

## 2018-02-21 DIAGNOSIS — D72829 Elevated white blood cell count, unspecified: Secondary | ICD-10-CM | POA: Diagnosis not present

## 2018-02-21 DIAGNOSIS — H9190 Unspecified hearing loss, unspecified ear: Secondary | ICD-10-CM | POA: Diagnosis not present

## 2018-02-21 DIAGNOSIS — S32599D Other specified fracture of unspecified pubis, subsequent encounter for fracture with routine healing: Secondary | ICD-10-CM | POA: Diagnosis not present

## 2018-02-21 DIAGNOSIS — M818 Other osteoporosis without current pathological fracture: Secondary | ICD-10-CM | POA: Diagnosis not present

## 2018-02-21 DIAGNOSIS — I129 Hypertensive chronic kidney disease with stage 1 through stage 4 chronic kidney disease, or unspecified chronic kidney disease: Secondary | ICD-10-CM | POA: Diagnosis present

## 2018-02-21 DIAGNOSIS — R404 Transient alteration of awareness: Secondary | ICD-10-CM | POA: Diagnosis not present

## 2018-02-21 DIAGNOSIS — T80319A ABO incompatibility with hemolytic transfusion reaction, unspecified, initial encounter: Secondary | ICD-10-CM | POA: Diagnosis not present

## 2018-02-21 DIAGNOSIS — M2042 Other hammer toe(s) (acquired), left foot: Secondary | ICD-10-CM | POA: Diagnosis not present

## 2018-02-21 DIAGNOSIS — S92314A Nondisplaced fracture of first metatarsal bone, right foot, initial encounter for closed fracture: Secondary | ICD-10-CM | POA: Diagnosis not present

## 2018-02-21 DIAGNOSIS — F5104 Psychophysiologic insomnia: Secondary | ICD-10-CM | POA: Diagnosis present

## 2018-02-21 DIAGNOSIS — S0993XA Unspecified injury of face, initial encounter: Secondary | ICD-10-CM | POA: Diagnosis not present

## 2018-02-21 DIAGNOSIS — S01511D Laceration without foreign body of lip, subsequent encounter: Secondary | ICD-10-CM | POA: Diagnosis not present

## 2018-02-21 DIAGNOSIS — M353 Polymyalgia rheumatica: Secondary | ICD-10-CM | POA: Diagnosis not present

## 2018-02-21 DIAGNOSIS — J984 Other disorders of lung: Secondary | ICD-10-CM | POA: Diagnosis present

## 2018-02-21 DIAGNOSIS — M25562 Pain in left knee: Secondary | ICD-10-CM | POA: Diagnosis not present

## 2018-02-21 DIAGNOSIS — E1122 Type 2 diabetes mellitus with diabetic chronic kidney disease: Secondary | ICD-10-CM | POA: Diagnosis not present

## 2018-02-21 DIAGNOSIS — R0689 Other abnormalities of breathing: Secondary | ICD-10-CM | POA: Diagnosis not present

## 2018-02-21 DIAGNOSIS — F329 Major depressive disorder, single episode, unspecified: Secondary | ICD-10-CM | POA: Diagnosis not present

## 2018-02-21 DIAGNOSIS — M25461 Effusion, right knee: Secondary | ICD-10-CM | POA: Diagnosis not present

## 2018-02-21 DIAGNOSIS — R2689 Other abnormalities of gait and mobility: Secondary | ICD-10-CM | POA: Diagnosis not present

## 2018-02-21 DIAGNOSIS — R52 Pain, unspecified: Secondary | ICD-10-CM | POA: Diagnosis not present

## 2018-02-21 DIAGNOSIS — R5383 Other fatigue: Secondary | ICD-10-CM | POA: Diagnosis not present

## 2018-02-21 DIAGNOSIS — Z66 Do not resuscitate: Secondary | ICD-10-CM | POA: Diagnosis present

## 2018-02-21 DIAGNOSIS — S066X9A Traumatic subarachnoid hemorrhage with loss of consciousness of unspecified duration, initial encounter: Secondary | ICD-10-CM | POA: Diagnosis not present

## 2018-02-21 DIAGNOSIS — R609 Edema, unspecified: Secondary | ICD-10-CM | POA: Diagnosis not present

## 2018-02-21 DIAGNOSIS — S8001XA Contusion of right knee, initial encounter: Secondary | ICD-10-CM | POA: Diagnosis not present

## 2018-02-21 DIAGNOSIS — E785 Hyperlipidemia, unspecified: Secondary | ICD-10-CM | POA: Diagnosis not present

## 2018-02-21 DIAGNOSIS — G453 Amaurosis fugax: Secondary | ICD-10-CM | POA: Diagnosis not present

## 2018-02-21 DIAGNOSIS — R58 Hemorrhage, not elsewhere classified: Secondary | ICD-10-CM | POA: Diagnosis not present

## 2018-02-21 DIAGNOSIS — Z8673 Personal history of transient ischemic attack (TIA), and cerebral infarction without residual deficits: Secondary | ICD-10-CM | POA: Diagnosis not present

## 2018-02-21 DIAGNOSIS — R40241 Glasgow coma scale score 13-15, unspecified time: Secondary | ICD-10-CM | POA: Diagnosis not present

## 2018-02-21 DIAGNOSIS — Z043 Encounter for examination and observation following other accident: Secondary | ICD-10-CM | POA: Diagnosis not present

## 2018-02-21 DIAGNOSIS — N951 Menopausal and female climacteric states: Secondary | ICD-10-CM | POA: Diagnosis not present

## 2018-02-21 DIAGNOSIS — Z8619 Personal history of other infectious and parasitic diseases: Secondary | ICD-10-CM | POA: Diagnosis not present

## 2018-02-21 DIAGNOSIS — Z79890 Hormone replacement therapy (postmenopausal): Secondary | ICD-10-CM | POA: Diagnosis not present

## 2018-02-21 DIAGNOSIS — N750 Cyst of Bartholin's gland: Secondary | ICD-10-CM | POA: Diagnosis not present

## 2018-02-21 DIAGNOSIS — S62231A Other displaced fracture of base of first metacarpal bone, right hand, initial encounter for closed fracture: Secondary | ICD-10-CM | POA: Diagnosis not present

## 2018-02-21 DIAGNOSIS — S00531A Contusion of lip, initial encounter: Secondary | ICD-10-CM | POA: Diagnosis not present

## 2018-02-21 DIAGNOSIS — B351 Tinea unguium: Secondary | ICD-10-CM | POA: Diagnosis not present

## 2018-02-21 DIAGNOSIS — S62221A Displaced Rolando's fracture, right hand, initial encounter for closed fracture: Secondary | ICD-10-CM | POA: Diagnosis not present

## 2018-02-21 DIAGNOSIS — I609 Nontraumatic subarachnoid hemorrhage, unspecified: Secondary | ICD-10-CM | POA: Diagnosis not present

## 2018-02-21 DIAGNOSIS — E1151 Type 2 diabetes mellitus with diabetic peripheral angiopathy without gangrene: Secondary | ICD-10-CM | POA: Diagnosis present

## 2018-02-21 DIAGNOSIS — N183 Chronic kidney disease, stage 3 (moderate): Secondary | ICD-10-CM | POA: Diagnosis not present

## 2018-02-21 DIAGNOSIS — S01511A Laceration without foreign body of lip, initial encounter: Secondary | ICD-10-CM | POA: Diagnosis not present

## 2018-02-21 DIAGNOSIS — E782 Mixed hyperlipidemia: Secondary | ICD-10-CM | POA: Diagnosis not present

## 2018-02-21 DIAGNOSIS — S62201A Unspecified fracture of first metacarpal bone, right hand, initial encounter for closed fracture: Secondary | ICD-10-CM | POA: Diagnosis not present

## 2018-02-21 DIAGNOSIS — E119 Type 2 diabetes mellitus without complications: Secondary | ICD-10-CM | POA: Diagnosis not present

## 2018-02-21 DIAGNOSIS — T1490XD Injury, unspecified, subsequent encounter: Secondary | ICD-10-CM | POA: Diagnosis not present

## 2018-02-21 DIAGNOSIS — D62 Acute posthemorrhagic anemia: Secondary | ICD-10-CM | POA: Diagnosis not present

## 2018-02-21 DIAGNOSIS — R410 Disorientation, unspecified: Secondary | ICD-10-CM | POA: Diagnosis not present

## 2018-02-21 DIAGNOSIS — R17 Unspecified jaundice: Secondary | ICD-10-CM | POA: Diagnosis not present

## 2018-02-21 DIAGNOSIS — M25561 Pain in right knee: Secondary | ICD-10-CM | POA: Diagnosis not present

## 2018-02-21 DIAGNOSIS — Z833 Family history of diabetes mellitus: Secondary | ICD-10-CM | POA: Diagnosis not present

## 2018-02-21 DIAGNOSIS — R32 Unspecified urinary incontinence: Secondary | ICD-10-CM | POA: Diagnosis not present

## 2018-02-21 DIAGNOSIS — W19XXXD Unspecified fall, subsequent encounter: Secondary | ICD-10-CM | POA: Diagnosis not present

## 2018-02-24 DIAGNOSIS — G453 Amaurosis fugax: Secondary | ICD-10-CM | POA: Diagnosis not present

## 2018-02-24 DIAGNOSIS — R2689 Other abnormalities of gait and mobility: Secondary | ICD-10-CM | POA: Diagnosis not present

## 2018-02-24 DIAGNOSIS — S62201A Unspecified fracture of first metacarpal bone, right hand, initial encounter for closed fracture: Secondary | ICD-10-CM | POA: Diagnosis not present

## 2018-02-24 DIAGNOSIS — D649 Anemia, unspecified: Secondary | ICD-10-CM | POA: Diagnosis not present

## 2018-02-24 DIAGNOSIS — S01511A Laceration without foreign body of lip, initial encounter: Secondary | ICD-10-CM | POA: Diagnosis not present

## 2018-02-24 DIAGNOSIS — S62514A Nondisplaced fracture of proximal phalanx of right thumb, initial encounter for closed fracture: Secondary | ICD-10-CM | POA: Diagnosis not present

## 2018-02-24 DIAGNOSIS — E119 Type 2 diabetes mellitus without complications: Secondary | ICD-10-CM | POA: Diagnosis not present

## 2018-02-24 DIAGNOSIS — Z8619 Personal history of other infectious and parasitic diseases: Secondary | ICD-10-CM | POA: Diagnosis not present

## 2018-02-24 DIAGNOSIS — M810 Age-related osteoporosis without current pathological fracture: Secondary | ICD-10-CM | POA: Diagnosis not present

## 2018-02-24 DIAGNOSIS — E039 Hypothyroidism, unspecified: Secondary | ICD-10-CM | POA: Diagnosis not present

## 2018-02-24 DIAGNOSIS — Z741 Need for assistance with personal care: Secondary | ICD-10-CM | POA: Diagnosis not present

## 2018-02-24 DIAGNOSIS — R5383 Other fatigue: Secondary | ICD-10-CM | POA: Diagnosis not present

## 2018-02-24 DIAGNOSIS — K635 Polyp of colon: Secondary | ICD-10-CM | POA: Diagnosis not present

## 2018-02-24 DIAGNOSIS — N183 Chronic kidney disease, stage 3 (moderate): Secondary | ICD-10-CM | POA: Diagnosis not present

## 2018-02-24 DIAGNOSIS — S32599D Other specified fracture of unspecified pubis, subsequent encounter for fracture with routine healing: Secondary | ICD-10-CM | POA: Diagnosis not present

## 2018-02-24 DIAGNOSIS — M353 Polymyalgia rheumatica: Secondary | ICD-10-CM | POA: Diagnosis not present

## 2018-02-24 DIAGNOSIS — M818 Other osteoporosis without current pathological fracture: Secondary | ICD-10-CM | POA: Diagnosis not present

## 2018-02-24 DIAGNOSIS — S62201D Unspecified fracture of first metacarpal bone, right hand, subsequent encounter for fracture with routine healing: Secondary | ICD-10-CM | POA: Diagnosis not present

## 2018-02-24 DIAGNOSIS — M2042 Other hammer toe(s) (acquired), left foot: Secondary | ICD-10-CM | POA: Diagnosis not present

## 2018-02-24 DIAGNOSIS — B351 Tinea unguium: Secondary | ICD-10-CM | POA: Diagnosis not present

## 2018-02-24 DIAGNOSIS — F5104 Psychophysiologic insomnia: Secondary | ICD-10-CM | POA: Diagnosis not present

## 2018-02-24 DIAGNOSIS — T1490XD Injury, unspecified, subsequent encounter: Secondary | ICD-10-CM | POA: Diagnosis not present

## 2018-02-24 DIAGNOSIS — N750 Cyst of Bartholin's gland: Secondary | ICD-10-CM | POA: Diagnosis not present

## 2018-02-24 DIAGNOSIS — S066X9A Traumatic subarachnoid hemorrhage with loss of consciousness of unspecified duration, initial encounter: Secondary | ICD-10-CM | POA: Diagnosis not present

## 2018-02-24 DIAGNOSIS — I609 Nontraumatic subarachnoid hemorrhage, unspecified: Secondary | ICD-10-CM | POA: Diagnosis not present

## 2018-02-24 DIAGNOSIS — H9190 Unspecified hearing loss, unspecified ear: Secondary | ICD-10-CM | POA: Diagnosis not present

## 2018-02-24 DIAGNOSIS — N951 Menopausal and female climacteric states: Secondary | ICD-10-CM | POA: Diagnosis not present

## 2018-02-24 DIAGNOSIS — R2681 Unsteadiness on feet: Secondary | ICD-10-CM | POA: Diagnosis not present

## 2018-02-24 DIAGNOSIS — S01511D Laceration without foreign body of lip, subsequent encounter: Secondary | ICD-10-CM | POA: Diagnosis not present

## 2018-02-24 DIAGNOSIS — R32 Unspecified urinary incontinence: Secondary | ICD-10-CM | POA: Diagnosis not present

## 2018-02-24 DIAGNOSIS — M2041 Other hammer toe(s) (acquired), right foot: Secondary | ICD-10-CM | POA: Diagnosis not present

## 2018-02-24 DIAGNOSIS — R262 Difficulty in walking, not elsewhere classified: Secondary | ICD-10-CM | POA: Diagnosis not present

## 2018-02-24 DIAGNOSIS — W19XXXD Unspecified fall, subsequent encounter: Secondary | ICD-10-CM | POA: Diagnosis not present

## 2018-02-25 DIAGNOSIS — M810 Age-related osteoporosis without current pathological fracture: Secondary | ICD-10-CM | POA: Diagnosis not present

## 2018-02-25 DIAGNOSIS — R262 Difficulty in walking, not elsewhere classified: Secondary | ICD-10-CM | POA: Diagnosis not present

## 2018-02-25 DIAGNOSIS — E039 Hypothyroidism, unspecified: Secondary | ICD-10-CM | POA: Diagnosis not present

## 2018-02-25 DIAGNOSIS — I609 Nontraumatic subarachnoid hemorrhage, unspecified: Secondary | ICD-10-CM | POA: Diagnosis not present

## 2018-03-04 DIAGNOSIS — S62514A Nondisplaced fracture of proximal phalanx of right thumb, initial encounter for closed fracture: Secondary | ICD-10-CM | POA: Diagnosis not present

## 2018-03-06 ENCOUNTER — Other Ambulatory Visit: Payer: Self-pay | Admitting: *Deleted

## 2018-03-06 NOTE — Patient Outreach (Signed)
[facility] [facility]) Care Management  03/06/2018  Beverly Estrada 07-Apr-1929 [id number]   Facility site visit at [facility].  Met with Janett Billow SW/Discharge to discuss patient's discharge disposition.  Janett Billow made me aware patient planned to discharge to home on Tuesday.  Janett Billow stated this patient is going home with 24 hour care and has no care management needs.   Rutherford Limerick RN, BSN [place] Acute Care Coordinator [phone]) Business Mobile [phone]) Toll free office

## 2018-03-09 ENCOUNTER — Other Ambulatory Visit: Payer: Self-pay | Admitting: Rheumatology

## 2018-03-09 NOTE — Telephone Encounter (Signed)
Last Visit: 02/10/18 Next Visit: 07/14/18 Labs: 12/12/17 Glucose is elevated and WBC count is elevated   Left message to remind patient she is due to update labs   Okay to refill per Dr. Estanislado Pandy

## 2018-03-10 ENCOUNTER — Telehealth: Payer: Self-pay | Admitting: Cardiology

## 2018-03-10 NOTE — Telephone Encounter (Signed)
LMOVM requesting that pt send manual transmission b/c home monitor has not updated in at least 14 days.    

## 2018-03-12 ENCOUNTER — Other Ambulatory Visit: Payer: Self-pay | Admitting: Rheumatology

## 2018-03-12 DIAGNOSIS — F329 Major depressive disorder, single episode, unspecified: Secondary | ICD-10-CM | POA: Diagnosis not present

## 2018-03-12 DIAGNOSIS — Z9181 History of falling: Secondary | ICD-10-CM | POA: Diagnosis not present

## 2018-03-12 DIAGNOSIS — E1122 Type 2 diabetes mellitus with diabetic chronic kidney disease: Secondary | ICD-10-CM | POA: Diagnosis not present

## 2018-03-12 DIAGNOSIS — Z8619 Personal history of other infectious and parasitic diseases: Secondary | ICD-10-CM | POA: Diagnosis not present

## 2018-03-12 DIAGNOSIS — Z85828 Personal history of other malignant neoplasm of skin: Secondary | ICD-10-CM | POA: Diagnosis not present

## 2018-03-12 DIAGNOSIS — E1151 Type 2 diabetes mellitus with diabetic peripheral angiopathy without gangrene: Secondary | ICD-10-CM | POA: Diagnosis not present

## 2018-03-12 DIAGNOSIS — G453 Amaurosis fugax: Secondary | ICD-10-CM | POA: Diagnosis not present

## 2018-03-12 DIAGNOSIS — M069 Rheumatoid arthritis, unspecified: Secondary | ICD-10-CM | POA: Diagnosis not present

## 2018-03-12 DIAGNOSIS — B351 Tinea unguium: Secondary | ICD-10-CM | POA: Diagnosis not present

## 2018-03-12 DIAGNOSIS — M80041D Age-related osteoporosis with current pathological fracture, right hand, subsequent encounter for fracture with routine healing: Secondary | ICD-10-CM | POA: Diagnosis not present

## 2018-03-12 DIAGNOSIS — I129 Hypertensive chronic kidney disease with stage 1 through stage 4 chronic kidney disease, or unspecified chronic kidney disease: Secondary | ICD-10-CM | POA: Diagnosis not present

## 2018-03-12 DIAGNOSIS — Z8673 Personal history of transient ischemic attack (TIA), and cerebral infarction without residual deficits: Secondary | ICD-10-CM | POA: Diagnosis not present

## 2018-03-12 DIAGNOSIS — Z87440 Personal history of urinary (tract) infections: Secondary | ICD-10-CM | POA: Diagnosis not present

## 2018-03-12 DIAGNOSIS — E785 Hyperlipidemia, unspecified: Secondary | ICD-10-CM | POA: Diagnosis not present

## 2018-03-12 DIAGNOSIS — D631 Anemia in chronic kidney disease: Secondary | ICD-10-CM | POA: Diagnosis not present

## 2018-03-12 DIAGNOSIS — M353 Polymyalgia rheumatica: Secondary | ICD-10-CM | POA: Diagnosis not present

## 2018-03-12 DIAGNOSIS — E039 Hypothyroidism, unspecified: Secondary | ICD-10-CM | POA: Diagnosis not present

## 2018-03-12 DIAGNOSIS — N183 Chronic kidney disease, stage 3 (moderate): Secondary | ICD-10-CM | POA: Diagnosis not present

## 2018-03-13 DIAGNOSIS — M80041D Age-related osteoporosis with current pathological fracture, right hand, subsequent encounter for fracture with routine healing: Secondary | ICD-10-CM | POA: Diagnosis not present

## 2018-03-13 DIAGNOSIS — D631 Anemia in chronic kidney disease: Secondary | ICD-10-CM | POA: Diagnosis not present

## 2018-03-13 DIAGNOSIS — M353 Polymyalgia rheumatica: Secondary | ICD-10-CM | POA: Diagnosis not present

## 2018-03-13 DIAGNOSIS — I129 Hypertensive chronic kidney disease with stage 1 through stage 4 chronic kidney disease, or unspecified chronic kidney disease: Secondary | ICD-10-CM | POA: Diagnosis not present

## 2018-03-13 DIAGNOSIS — N183 Chronic kidney disease, stage 3 (moderate): Secondary | ICD-10-CM | POA: Diagnosis not present

## 2018-03-13 DIAGNOSIS — E1122 Type 2 diabetes mellitus with diabetic chronic kidney disease: Secondary | ICD-10-CM | POA: Diagnosis not present

## 2018-03-16 ENCOUNTER — Ambulatory Visit (INDEPENDENT_AMBULATORY_CARE_PROVIDER_SITE_OTHER): Payer: Medicare Other

## 2018-03-16 DIAGNOSIS — D631 Anemia in chronic kidney disease: Secondary | ICD-10-CM | POA: Diagnosis not present

## 2018-03-16 DIAGNOSIS — E1122 Type 2 diabetes mellitus with diabetic chronic kidney disease: Secondary | ICD-10-CM | POA: Diagnosis not present

## 2018-03-16 DIAGNOSIS — M80041D Age-related osteoporosis with current pathological fracture, right hand, subsequent encounter for fracture with routine healing: Secondary | ICD-10-CM | POA: Diagnosis not present

## 2018-03-16 DIAGNOSIS — M353 Polymyalgia rheumatica: Secondary | ICD-10-CM | POA: Diagnosis not present

## 2018-03-16 DIAGNOSIS — I129 Hypertensive chronic kidney disease with stage 1 through stage 4 chronic kidney disease, or unspecified chronic kidney disease: Secondary | ICD-10-CM | POA: Diagnosis not present

## 2018-03-16 DIAGNOSIS — I639 Cerebral infarction, unspecified: Secondary | ICD-10-CM

## 2018-03-16 DIAGNOSIS — N183 Chronic kidney disease, stage 3 (moderate): Secondary | ICD-10-CM | POA: Diagnosis not present

## 2018-03-16 NOTE — Progress Notes (Signed)
Carelink Summary Report / Loop Recorder 

## 2018-03-18 DIAGNOSIS — I129 Hypertensive chronic kidney disease with stage 1 through stage 4 chronic kidney disease, or unspecified chronic kidney disease: Secondary | ICD-10-CM | POA: Diagnosis not present

## 2018-03-18 DIAGNOSIS — M80041D Age-related osteoporosis with current pathological fracture, right hand, subsequent encounter for fracture with routine healing: Secondary | ICD-10-CM | POA: Diagnosis not present

## 2018-03-18 DIAGNOSIS — N183 Chronic kidney disease, stage 3 (moderate): Secondary | ICD-10-CM | POA: Diagnosis not present

## 2018-03-18 DIAGNOSIS — E1122 Type 2 diabetes mellitus with diabetic chronic kidney disease: Secondary | ICD-10-CM | POA: Diagnosis not present

## 2018-03-18 DIAGNOSIS — M353 Polymyalgia rheumatica: Secondary | ICD-10-CM | POA: Diagnosis not present

## 2018-03-18 DIAGNOSIS — D631 Anemia in chronic kidney disease: Secondary | ICD-10-CM | POA: Diagnosis not present

## 2018-03-20 DIAGNOSIS — Z8739 Personal history of other diseases of the musculoskeletal system and connective tissue: Secondary | ICD-10-CM | POA: Diagnosis not present

## 2018-03-20 DIAGNOSIS — K130 Diseases of lips: Secondary | ICD-10-CM | POA: Diagnosis not present

## 2018-03-20 DIAGNOSIS — I739 Peripheral vascular disease, unspecified: Secondary | ICD-10-CM | POA: Diagnosis not present

## 2018-03-20 DIAGNOSIS — Z6825 Body mass index (BMI) 25.0-25.9, adult: Secondary | ICD-10-CM | POA: Diagnosis not present

## 2018-03-20 DIAGNOSIS — W19XXXA Unspecified fall, initial encounter: Secondary | ICD-10-CM | POA: Diagnosis not present

## 2018-03-20 DIAGNOSIS — G3184 Mild cognitive impairment, so stated: Secondary | ICD-10-CM | POA: Diagnosis not present

## 2018-03-23 DIAGNOSIS — M80041D Age-related osteoporosis with current pathological fracture, right hand, subsequent encounter for fracture with routine healing: Secondary | ICD-10-CM | POA: Diagnosis not present

## 2018-03-23 DIAGNOSIS — M353 Polymyalgia rheumatica: Secondary | ICD-10-CM | POA: Diagnosis not present

## 2018-03-23 DIAGNOSIS — N183 Chronic kidney disease, stage 3 (moderate): Secondary | ICD-10-CM | POA: Diagnosis not present

## 2018-03-23 DIAGNOSIS — E1122 Type 2 diabetes mellitus with diabetic chronic kidney disease: Secondary | ICD-10-CM | POA: Diagnosis not present

## 2018-03-23 DIAGNOSIS — I129 Hypertensive chronic kidney disease with stage 1 through stage 4 chronic kidney disease, or unspecified chronic kidney disease: Secondary | ICD-10-CM | POA: Diagnosis not present

## 2018-03-23 DIAGNOSIS — D631 Anemia in chronic kidney disease: Secondary | ICD-10-CM | POA: Diagnosis not present

## 2018-03-26 ENCOUNTER — Other Ambulatory Visit: Payer: Self-pay | Admitting: Rheumatology

## 2018-03-28 ENCOUNTER — Other Ambulatory Visit: Payer: Self-pay | Admitting: Rheumatology

## 2018-03-28 LAB — CUP PACEART REMOTE DEVICE CHECK
Date Time Interrogation Session: 20191112024036
Implantable Pulse Generator Implant Date: 20180525

## 2018-03-31 DIAGNOSIS — I129 Hypertensive chronic kidney disease with stage 1 through stage 4 chronic kidney disease, or unspecified chronic kidney disease: Secondary | ICD-10-CM | POA: Diagnosis not present

## 2018-03-31 DIAGNOSIS — D631 Anemia in chronic kidney disease: Secondary | ICD-10-CM | POA: Diagnosis not present

## 2018-03-31 DIAGNOSIS — M80041D Age-related osteoporosis with current pathological fracture, right hand, subsequent encounter for fracture with routine healing: Secondary | ICD-10-CM | POA: Diagnosis not present

## 2018-03-31 DIAGNOSIS — N183 Chronic kidney disease, stage 3 (moderate): Secondary | ICD-10-CM | POA: Diagnosis not present

## 2018-03-31 DIAGNOSIS — E1122 Type 2 diabetes mellitus with diabetic chronic kidney disease: Secondary | ICD-10-CM | POA: Diagnosis not present

## 2018-03-31 DIAGNOSIS — M353 Polymyalgia rheumatica: Secondary | ICD-10-CM | POA: Diagnosis not present

## 2018-04-02 DIAGNOSIS — H34811 Central retinal vein occlusion, right eye, with macular edema: Secondary | ICD-10-CM | POA: Diagnosis not present

## 2018-04-03 DIAGNOSIS — E1122 Type 2 diabetes mellitus with diabetic chronic kidney disease: Secondary | ICD-10-CM | POA: Diagnosis not present

## 2018-04-03 DIAGNOSIS — I129 Hypertensive chronic kidney disease with stage 1 through stage 4 chronic kidney disease, or unspecified chronic kidney disease: Secondary | ICD-10-CM | POA: Diagnosis not present

## 2018-04-03 DIAGNOSIS — D631 Anemia in chronic kidney disease: Secondary | ICD-10-CM | POA: Diagnosis not present

## 2018-04-03 DIAGNOSIS — M80041D Age-related osteoporosis with current pathological fracture, right hand, subsequent encounter for fracture with routine healing: Secondary | ICD-10-CM | POA: Diagnosis not present

## 2018-04-03 DIAGNOSIS — N183 Chronic kidney disease, stage 3 (moderate): Secondary | ICD-10-CM | POA: Diagnosis not present

## 2018-04-03 DIAGNOSIS — M353 Polymyalgia rheumatica: Secondary | ICD-10-CM | POA: Diagnosis not present

## 2018-04-06 ENCOUNTER — Other Ambulatory Visit: Payer: Self-pay | Admitting: Rheumatology

## 2018-04-07 ENCOUNTER — Telehealth: Payer: Self-pay | Admitting: Rheumatology

## 2018-04-07 DIAGNOSIS — Z79899 Other long term (current) drug therapy: Secondary | ICD-10-CM

## 2018-04-07 DIAGNOSIS — E1122 Type 2 diabetes mellitus with diabetic chronic kidney disease: Secondary | ICD-10-CM | POA: Diagnosis not present

## 2018-04-07 DIAGNOSIS — I129 Hypertensive chronic kidney disease with stage 1 through stage 4 chronic kidney disease, or unspecified chronic kidney disease: Secondary | ICD-10-CM | POA: Diagnosis not present

## 2018-04-07 DIAGNOSIS — M353 Polymyalgia rheumatica: Secondary | ICD-10-CM | POA: Diagnosis not present

## 2018-04-07 DIAGNOSIS — M80041D Age-related osteoporosis with current pathological fracture, right hand, subsequent encounter for fracture with routine healing: Secondary | ICD-10-CM | POA: Diagnosis not present

## 2018-04-07 DIAGNOSIS — D631 Anemia in chronic kidney disease: Secondary | ICD-10-CM | POA: Diagnosis not present

## 2018-04-07 DIAGNOSIS — N183 Chronic kidney disease, stage 3 (moderate): Secondary | ICD-10-CM | POA: Diagnosis not present

## 2018-04-07 NOTE — Telephone Encounter (Signed)
ok 

## 2018-04-07 NOTE — Telephone Encounter (Signed)
Patient is going to have labs down at [facility].  GP office Fax# [phone] Lab orders faxed.

## 2018-04-07 NOTE — Telephone Encounter (Signed)
Last Visit: 02/10/18 Next Visit: 07/14/18 Labs: 12/12/17 Glucose is elevated and WBC count is elevated   Left message to remind patient she is due to update labs   Okay to refill 30 day supply MTX?

## 2018-04-07 NOTE — Telephone Encounter (Signed)
Patient's daughter Jackelyn Poling left a voicemail requesting a return call with questions regarding her mom's labwork and having it done at the hospital in [place].

## 2018-04-08 ENCOUNTER — Other Ambulatory Visit: Payer: Self-pay

## 2018-04-08 DIAGNOSIS — W19XXXD Unspecified fall, subsequent encounter: Secondary | ICD-10-CM | POA: Diagnosis not present

## 2018-04-08 DIAGNOSIS — Z7902 Long term (current) use of antithrombotics/antiplatelets: Secondary | ICD-10-CM | POA: Diagnosis not present

## 2018-04-08 DIAGNOSIS — Z8673 Personal history of transient ischemic attack (TIA), and cerebral infarction without residual deficits: Secondary | ICD-10-CM | POA: Diagnosis not present

## 2018-04-08 DIAGNOSIS — S066X9D Traumatic subarachnoid hemorrhage with loss of consciousness of unspecified duration, subsequent encounter: Secondary | ICD-10-CM | POA: Diagnosis not present

## 2018-04-08 DIAGNOSIS — Z79899 Other long term (current) drug therapy: Secondary | ICD-10-CM | POA: Diagnosis not present

## 2018-04-08 DIAGNOSIS — S066X9A Traumatic subarachnoid hemorrhage with loss of consciousness of unspecified duration, initial encounter: Secondary | ICD-10-CM | POA: Diagnosis not present

## 2018-04-09 DIAGNOSIS — N183 Chronic kidney disease, stage 3 (moderate): Secondary | ICD-10-CM | POA: Diagnosis not present

## 2018-04-09 DIAGNOSIS — E1122 Type 2 diabetes mellitus with diabetic chronic kidney disease: Secondary | ICD-10-CM | POA: Diagnosis not present

## 2018-04-09 DIAGNOSIS — D631 Anemia in chronic kidney disease: Secondary | ICD-10-CM | POA: Diagnosis not present

## 2018-04-09 DIAGNOSIS — M353 Polymyalgia rheumatica: Secondary | ICD-10-CM | POA: Diagnosis not present

## 2018-04-09 DIAGNOSIS — M80041D Age-related osteoporosis with current pathological fracture, right hand, subsequent encounter for fracture with routine healing: Secondary | ICD-10-CM | POA: Diagnosis not present

## 2018-04-09 DIAGNOSIS — I129 Hypertensive chronic kidney disease with stage 1 through stage 4 chronic kidney disease, or unspecified chronic kidney disease: Secondary | ICD-10-CM | POA: Diagnosis not present

## 2018-04-09 LAB — CBC WITH DIFFERENTIAL/PLATELET
Absolute Monocytes: 2047 cells/uL — ABNORMAL HIGH (ref 200–950)
Basophils Absolute: 43 cells/uL (ref 0–200)
Basophils Relative: 0.5 %
Eosinophils Absolute: 17 cells/uL (ref 15–500)
Eosinophils Relative: 0.2 %
HCT: 33.6 % — ABNORMAL LOW (ref 35.0–45.0)
Hemoglobin: 11.2 g/dL — ABNORMAL LOW (ref 11.7–15.5)
Lymphs Abs: 1471 cells/uL (ref 850–3900)
MCH: 31.4 pg (ref 27.0–33.0)
MCHC: 33.3 g/dL (ref 32.0–36.0)
MCV: 94.1 fL (ref 80.0–100.0)
MPV: 10.5 fL (ref 7.5–12.5)
Monocytes Relative: 23.8 %
Neutro Abs: 5022 cells/uL (ref 1500–7800)
Neutrophils Relative %: 58.4 %
Platelets: 198 10*3/uL (ref 140–400)
RBC: 3.57 10*6/uL — ABNORMAL LOW (ref 3.80–5.10)
RDW: 13.9 % (ref 11.0–15.0)
Total Lymphocyte: 17.1 %
WBC: 8.6 10*3/uL (ref 3.8–10.8)

## 2018-04-09 LAB — COMPLETE METABOLIC PANEL WITH GFR
AG Ratio: 1.5 (calc) (ref 1.0–2.5)
ALT: 9 U/L (ref 6–29)
AST: 15 U/L (ref 10–35)
Albumin: 4 g/dL (ref 3.6–5.1)
Alkaline phosphatase (APISO): 55 U/L (ref 33–130)
BUN: 14 mg/dL (ref 7–25)
CO2: 25 mmol/L (ref 20–32)
Calcium: 9.1 mg/dL (ref 8.6–10.4)
Chloride: 107 mmol/L (ref 98–110)
Creat: 0.62 mg/dL (ref 0.60–0.88)
GFR, Est African American: 95 mL/min/{1.73_m2} (ref >=60)
GFR, Est Non African American: 82 mL/min/{1.73_m2} (ref >=60)
Globulin: 2.7 g/dL (calc) (ref 1.9–3.7)
Glucose, Bld: 130 mg/dL — ABNORMAL HIGH (ref 65–99)
Potassium: 3.8 mmol/L (ref 3.5–5.3)
Sodium: 141 mmol/L (ref 135–146)
Total Bilirubin: 0.8 mg/dL (ref 0.2–1.2)
Total Protein: 6.7 g/dL (ref 6.1–8.1)

## 2018-04-09 NOTE — Progress Notes (Signed)
Glucose is elevated-130. Rest of CMP WNL.

## 2018-04-10 DIAGNOSIS — S62514D Nondisplaced fracture of proximal phalanx of right thumb, subsequent encounter for fracture with routine healing: Secondary | ICD-10-CM | POA: Diagnosis not present

## 2018-04-14 DIAGNOSIS — M80041D Age-related osteoporosis with current pathological fracture, right hand, subsequent encounter for fracture with routine healing: Secondary | ICD-10-CM | POA: Diagnosis not present

## 2018-04-14 DIAGNOSIS — I129 Hypertensive chronic kidney disease with stage 1 through stage 4 chronic kidney disease, or unspecified chronic kidney disease: Secondary | ICD-10-CM | POA: Diagnosis not present

## 2018-04-14 DIAGNOSIS — M353 Polymyalgia rheumatica: Secondary | ICD-10-CM | POA: Diagnosis not present

## 2018-04-14 DIAGNOSIS — N183 Chronic kidney disease, stage 3 (moderate): Secondary | ICD-10-CM | POA: Diagnosis not present

## 2018-04-14 DIAGNOSIS — D631 Anemia in chronic kidney disease: Secondary | ICD-10-CM | POA: Diagnosis not present

## 2018-04-14 DIAGNOSIS — E1122 Type 2 diabetes mellitus with diabetic chronic kidney disease: Secondary | ICD-10-CM | POA: Diagnosis not present

## 2018-04-15 DIAGNOSIS — M353 Polymyalgia rheumatica: Secondary | ICD-10-CM | POA: Diagnosis not present

## 2018-04-15 DIAGNOSIS — E1122 Type 2 diabetes mellitus with diabetic chronic kidney disease: Secondary | ICD-10-CM | POA: Diagnosis not present

## 2018-04-15 DIAGNOSIS — M80041D Age-related osteoporosis with current pathological fracture, right hand, subsequent encounter for fracture with routine healing: Secondary | ICD-10-CM | POA: Diagnosis not present

## 2018-04-15 DIAGNOSIS — I129 Hypertensive chronic kidney disease with stage 1 through stage 4 chronic kidney disease, or unspecified chronic kidney disease: Secondary | ICD-10-CM | POA: Diagnosis not present

## 2018-04-15 DIAGNOSIS — D631 Anemia in chronic kidney disease: Secondary | ICD-10-CM | POA: Diagnosis not present

## 2018-04-15 DIAGNOSIS — N183 Chronic kidney disease, stage 3 (moderate): Secondary | ICD-10-CM | POA: Diagnosis not present

## 2018-04-16 ENCOUNTER — Ambulatory Visit (INDEPENDENT_AMBULATORY_CARE_PROVIDER_SITE_OTHER): Payer: Medicare Other

## 2018-04-16 DIAGNOSIS — N183 Chronic kidney disease, stage 3 (moderate): Secondary | ICD-10-CM | POA: Diagnosis not present

## 2018-04-16 DIAGNOSIS — D631 Anemia in chronic kidney disease: Secondary | ICD-10-CM | POA: Diagnosis not present

## 2018-04-16 DIAGNOSIS — E1122 Type 2 diabetes mellitus with diabetic chronic kidney disease: Secondary | ICD-10-CM | POA: Diagnosis not present

## 2018-04-16 DIAGNOSIS — M353 Polymyalgia rheumatica: Secondary | ICD-10-CM | POA: Diagnosis not present

## 2018-04-16 DIAGNOSIS — I639 Cerebral infarction, unspecified: Secondary | ICD-10-CM

## 2018-04-16 DIAGNOSIS — I4891 Unspecified atrial fibrillation: Secondary | ICD-10-CM

## 2018-04-16 DIAGNOSIS — M80041D Age-related osteoporosis with current pathological fracture, right hand, subsequent encounter for fracture with routine healing: Secondary | ICD-10-CM | POA: Diagnosis not present

## 2018-04-16 DIAGNOSIS — I129 Hypertensive chronic kidney disease with stage 1 through stage 4 chronic kidney disease, or unspecified chronic kidney disease: Secondary | ICD-10-CM | POA: Diagnosis not present

## 2018-04-17 DIAGNOSIS — M353 Polymyalgia rheumatica: Secondary | ICD-10-CM | POA: Diagnosis not present

## 2018-04-17 DIAGNOSIS — M80041D Age-related osteoporosis with current pathological fracture, right hand, subsequent encounter for fracture with routine healing: Secondary | ICD-10-CM | POA: Diagnosis not present

## 2018-04-17 DIAGNOSIS — N183 Chronic kidney disease, stage 3 (moderate): Secondary | ICD-10-CM | POA: Diagnosis not present

## 2018-04-17 DIAGNOSIS — E1122 Type 2 diabetes mellitus with diabetic chronic kidney disease: Secondary | ICD-10-CM | POA: Diagnosis not present

## 2018-04-17 DIAGNOSIS — D631 Anemia in chronic kidney disease: Secondary | ICD-10-CM | POA: Diagnosis not present

## 2018-04-17 DIAGNOSIS — I129 Hypertensive chronic kidney disease with stage 1 through stage 4 chronic kidney disease, or unspecified chronic kidney disease: Secondary | ICD-10-CM | POA: Diagnosis not present

## 2018-04-17 NOTE — Progress Notes (Signed)
Carelink Summary Report / Loop Recorder 

## 2018-04-18 LAB — CUP PACEART REMOTE DEVICE CHECK
Date Time Interrogation Session: 20200117023705
Implantable Pulse Generator Implant Date: 20180525

## 2018-04-19 LAB — CUP PACEART REMOTE DEVICE CHECK
Date Time Interrogation Session: 20191215023529
Implantable Pulse Generator Implant Date: 20180525

## 2018-04-21 DIAGNOSIS — N183 Chronic kidney disease, stage 3 (moderate): Secondary | ICD-10-CM | POA: Diagnosis not present

## 2018-04-21 DIAGNOSIS — M80041D Age-related osteoporosis with current pathological fracture, right hand, subsequent encounter for fracture with routine healing: Secondary | ICD-10-CM | POA: Diagnosis not present

## 2018-04-21 DIAGNOSIS — I129 Hypertensive chronic kidney disease with stage 1 through stage 4 chronic kidney disease, or unspecified chronic kidney disease: Secondary | ICD-10-CM | POA: Diagnosis not present

## 2018-04-21 DIAGNOSIS — E1122 Type 2 diabetes mellitus with diabetic chronic kidney disease: Secondary | ICD-10-CM | POA: Diagnosis not present

## 2018-04-21 DIAGNOSIS — D631 Anemia in chronic kidney disease: Secondary | ICD-10-CM | POA: Diagnosis not present

## 2018-04-21 DIAGNOSIS — M353 Polymyalgia rheumatica: Secondary | ICD-10-CM | POA: Diagnosis not present

## 2018-04-22 DIAGNOSIS — I129 Hypertensive chronic kidney disease with stage 1 through stage 4 chronic kidney disease, or unspecified chronic kidney disease: Secondary | ICD-10-CM | POA: Diagnosis not present

## 2018-04-22 DIAGNOSIS — M353 Polymyalgia rheumatica: Secondary | ICD-10-CM | POA: Diagnosis not present

## 2018-04-22 DIAGNOSIS — E1122 Type 2 diabetes mellitus with diabetic chronic kidney disease: Secondary | ICD-10-CM | POA: Diagnosis not present

## 2018-04-22 DIAGNOSIS — M80041D Age-related osteoporosis with current pathological fracture, right hand, subsequent encounter for fracture with routine healing: Secondary | ICD-10-CM | POA: Diagnosis not present

## 2018-04-22 DIAGNOSIS — N183 Chronic kidney disease, stage 3 (moderate): Secondary | ICD-10-CM | POA: Diagnosis not present

## 2018-04-22 DIAGNOSIS — D631 Anemia in chronic kidney disease: Secondary | ICD-10-CM | POA: Diagnosis not present

## 2018-04-24 DIAGNOSIS — M80041D Age-related osteoporosis with current pathological fracture, right hand, subsequent encounter for fracture with routine healing: Secondary | ICD-10-CM | POA: Diagnosis not present

## 2018-04-24 DIAGNOSIS — M353 Polymyalgia rheumatica: Secondary | ICD-10-CM | POA: Diagnosis not present

## 2018-04-24 DIAGNOSIS — E1122 Type 2 diabetes mellitus with diabetic chronic kidney disease: Secondary | ICD-10-CM | POA: Diagnosis not present

## 2018-04-24 DIAGNOSIS — D631 Anemia in chronic kidney disease: Secondary | ICD-10-CM | POA: Diagnosis not present

## 2018-04-24 DIAGNOSIS — N183 Chronic kidney disease, stage 3 (moderate): Secondary | ICD-10-CM | POA: Diagnosis not present

## 2018-04-24 DIAGNOSIS — I129 Hypertensive chronic kidney disease with stage 1 through stage 4 chronic kidney disease, or unspecified chronic kidney disease: Secondary | ICD-10-CM | POA: Diagnosis not present

## 2018-04-28 ENCOUNTER — Encounter: Payer: Self-pay | Admitting: Plastic Surgery

## 2018-04-28 ENCOUNTER — Ambulatory Visit (INDEPENDENT_AMBULATORY_CARE_PROVIDER_SITE_OTHER): Payer: Medicare Other | Admitting: Plastic Surgery

## 2018-04-28 VITALS — BP 144/74 | HR 68 | Ht 65.0 in | Wt 145.0 lb

## 2018-04-28 DIAGNOSIS — M0609 Rheumatoid arthritis without rheumatoid factor, multiple sites: Secondary | ICD-10-CM

## 2018-04-28 DIAGNOSIS — Z8673 Personal history of transient ischemic attack (TIA), and cerebral infarction without residual deficits: Secondary | ICD-10-CM

## 2018-04-28 DIAGNOSIS — S01511A Laceration without foreign body of lip, initial encounter: Secondary | ICD-10-CM

## 2018-04-28 NOTE — Progress Notes (Signed)
Patient ID: Beverly Estrada, female    DOB: Nov 12, 1929, 85 y.o.   MRN: [id number]   Chief Complaint  Patient presents with  . Skin Problem    The patient is an 85 year old female valuation of her upper lip.  She was at home when she fell and sustained multiple injuries.  She had a deep laceration to her upper lip just to the left of midline.  She was seen in the ED and treated for the laceration and wrist injury.  She has a splint on her right wrist now.  There appears to be a gap in the muscle of the upper lip.  There is also a step-off at the vermilion border.  Most likely the muscle had some swelling and pulled away after the repair.  Says left loss of volume on the left side.  There is also a very firm area at the right side at the juncture of the step-off.  This could be scar tissue but also could be a foreign body.  She complains of it being tender and getting red at times.  It is very firm.. Her daughter also asked me about the patient's right ankle she has some redness and some skin discoloration.  It appears she has a stage I pressure ulcer in this area.  Her pulses are present.  It does not appear to be infected.   Review of Systems  Constitutional: Positive for activity change.  Eyes: Negative.   Respiratory: Negative.  Negative for chest tightness and shortness of breath.   Gastrointestinal: Negative for abdominal pain.  Genitourinary: Negative.   Skin: Negative for color change and wound.  Psychiatric/Behavioral: Positive for confusion.    Past Medical History:  Diagnosis Date  . Arthritis   . DDD (degenerative disc disease), lumbar 01/30/2016  . Glaucoma   . Hypothyroidism   . Multiple drug allergies    to multiple antibiotics.   . Osteoarthritis of both feet 01/30/2016  . Osteoarthritis of both hands 01/30/2016  . Osteoarthritis of both knees 01/30/2016  . Osteoarthritis of right hand 01/30/2016   moderate  . Polymyalgia rheumatica ([place])   . RA (rheumatoid  arthritis) ([place]) 01/30/2016   Sero Negative  . Skin cancer of nose   . Stroke [facility])    right eye     Past Surgical History:  Procedure Laterality Date  . APPENDECTOMY    . CHOLECYSTECTOMY    . COLONOSCOPY  ~2013   pooyps removed, one of them large per pt. Polyp pathology unclear. no rescreening study planned due to age  . LOOP RECORDER INSERTION N/A 08/23/2016   Procedure: Loop Recorder Insertion;  Surgeon: Thompson Grayer, MD;  Location: [facility];  Service: Cardiovascular;  Laterality: N/A;  . TEE WITHOUT CARDIOVERSION N/A 08/23/2016   Procedure: TRANSESOPHAGEAL ECHOCARDIOGRAM (TEE);  Surgeon: Larey Dresser, MD;  Location: [facility] ENDOSCOPY;  Service: Cardiovascular;  Laterality: N/A;  . TONSIL SURGERY        Current Outpatient Medications:  .  atorvastatin (LIPITOR) 40 MG tablet, Take 40 mg by mouth daily., Disp: , Rfl: 5 .  calcium-vitamin D 250-100 MG-UNIT tablet, Take 1 tablet by mouth daily. , Disp: , Rfl:  .  clopidogrel (PLAVIX) 75 MG tablet, Take 75 mg by mouth daily., Disp: , Rfl: 5 .  folic acid (FOLVITE) 1 MG tablet, Take 1 tablet (1 mg total) by mouth daily., Disp: 90 tablet, Rfl: 4 .  IRON PO, Take 65 mg by mouth daily.,  Disp: , Rfl:  .  levothyroxine (SYNTHROID, LEVOTHROID) 75 MCG tablet, daily., Disp: , Rfl: 2 .  methotrexate (RHEUMATREX) 2.5 MG tablet, TAKE 3 TABLETS BY MOUTH EVERY WEEK, Disp: 12 tablet, Rfl: 0 .  Multiple Vitamin (MULTIVITAMIN) tablet, Take 1 tablet by mouth daily., Disp: , Rfl:  .  Omega-3 Fatty Acids (FISH OIL) 1000 MG CAPS, Take 1,000 mg by mouth daily. , Disp: , Rfl:  .  sertraline (ZOLOFT) 50 MG tablet, Take 50 mg by mouth daily., Disp: , Rfl: 4  Current Facility-Administered Medications:  .  methylPREDNISolone acetate (DEPO-MEDROL) injection 80 mg, 80 mg, Other, Once, Magnus Sinning, MD   Objective:   Vitals:   04/28/18 1056  BP: (!) 144/74  Pulse: 68  SpO2: 98%    Physical Exam Vitals signs and nursing note reviewed.    Constitutional:      Appearance: Normal appearance.  HENT:     Head: Normocephalic.     Right Ear: External ear normal.     Left Ear: External ear normal.     Mouth/Throat:   Eyes:     Extraocular Movements: Extraocular movements intact.     Pupils: Pupils are equal, round, and reactive to light.  Neck:     Musculoskeletal: Normal range of motion.  Cardiovascular:     Rate and Rhythm: Normal rate.  Pulmonary:     Effort: Pulmonary effort is normal. No respiratory distress.  Abdominal:     General: Abdomen is flat. There is no distension.     Tenderness: There is no abdominal tenderness.  Neurological:     Mental Status: She is alert.  Psychiatric:        Mood and Affect: Mood normal.     Assessment & Plan:  History of stroke  Rheumatoid arthritis of multiple sites with negative rheumatoid factor (HCC)  Lip laceration, initial encounter  Recommend recreation of the laceration with excision of the foreign body or scar tissue and reapproximation of the muscle and lining up the vermilion border.  Due to the location and the patient's comprehension I think it would be better to do it in the OR.  Her daughter agrees. Silver socks recommended for her right ankle. Pictures in the chart  [place], DO

## 2018-04-28 NOTE — H&P (View-Only) (Signed)
Patient ID: Beverly Estrada, female    DOB: January 05, 1930, 85 y.o.   MRN: [id number]   Chief Complaint  Patient presents with  . Skin Problem    The patient is an 85 year old female valuation of her upper lip.  She was at home when she fell and sustained multiple injuries.  She had a deep laceration to her upper lip just to the left of midline.  She was seen in the ED and treated for the laceration and wrist injury.  She has a splint on her right wrist now.  There appears to be a gap in the muscle of the upper lip.  There is also a step-off at the vermilion border.  Most likely the muscle had some swelling and pulled away after the repair.  Says left loss of volume on the left side.  There is also a very firm area at the right side at the juncture of the step-off.  This could be scar tissue but also could be a foreign body.  She complains of it being tender and getting red at times.  It is very firm.. Her daughter also asked me about the patient's right ankle she has some redness and some skin discoloration.  It appears she has a stage I pressure ulcer in this area.  Her pulses are present.  It does not appear to be infected.   Review of Systems  Constitutional: Positive for activity change.  Eyes: Negative.   Respiratory: Negative.  Negative for chest tightness and shortness of breath.   Gastrointestinal: Negative for abdominal pain.  Genitourinary: Negative.   Skin: Negative for color change and wound.  Psychiatric/Behavioral: Positive for confusion.    Past Medical History:  Diagnosis Date  . Arthritis   . DDD (degenerative disc disease), lumbar 01/30/2016  . Glaucoma   . Hypothyroidism   . Multiple drug allergies    to multiple antibiotics.   . Osteoarthritis of both feet 01/30/2016  . Osteoarthritis of both hands 01/30/2016  . Osteoarthritis of both knees 01/30/2016  . Osteoarthritis of right hand 01/30/2016   moderate  . Polymyalgia rheumatica ([place])   . RA (rheumatoid  arthritis) ([place]) 01/30/2016   Sero Negative  . Skin cancer of nose   . Stroke [facility])    right eye     Past Surgical History:  Procedure Laterality Date  . APPENDECTOMY    . CHOLECYSTECTOMY    . COLONOSCOPY  ~2013   pooyps removed, one of them large per pt. Polyp pathology unclear. no rescreening study planned due to age  . LOOP RECORDER INSERTION N/A 08/23/2016   Procedure: Loop Recorder Insertion;  Surgeon: Thompson Grayer, MD;  Location: [facility];  Service: Cardiovascular;  Laterality: N/A;  . TEE WITHOUT CARDIOVERSION N/A 08/23/2016   Procedure: TRANSESOPHAGEAL ECHOCARDIOGRAM (TEE);  Surgeon: Larey Dresser, MD;  Location: [facility] ENDOSCOPY;  Service: Cardiovascular;  Laterality: N/A;  . TONSIL SURGERY        Current Outpatient Medications:  .  atorvastatin (LIPITOR) 40 MG tablet, Take 40 mg by mouth daily., Disp: , Rfl: 5 .  calcium-vitamin D 250-100 MG-UNIT tablet, Take 1 tablet by mouth daily. , Disp: , Rfl:  .  clopidogrel (PLAVIX) 75 MG tablet, Take 75 mg by mouth daily., Disp: , Rfl: 5 .  folic acid (FOLVITE) 1 MG tablet, Take 1 tablet (1 mg total) by mouth daily., Disp: 90 tablet, Rfl: 4 .  IRON PO, Take 65 mg by mouth daily.,  Disp: , Rfl:  .  levothyroxine (SYNTHROID, LEVOTHROID) 75 MCG tablet, daily., Disp: , Rfl: 2 .  methotrexate (RHEUMATREX) 2.5 MG tablet, TAKE 3 TABLETS BY MOUTH EVERY WEEK, Disp: 12 tablet, Rfl: 0 .  Multiple Vitamin (MULTIVITAMIN) tablet, Take 1 tablet by mouth daily., Disp: , Rfl:  .  Omega-3 Fatty Acids (FISH OIL) 1000 MG CAPS, Take 1,000 mg by mouth daily. , Disp: , Rfl:  .  sertraline (ZOLOFT) 50 MG tablet, Take 50 mg by mouth daily., Disp: , Rfl: 4  Current Facility-Administered Medications:  .  methylPREDNISolone acetate (DEPO-MEDROL) injection 80 mg, 80 mg, Other, Once, Magnus Sinning, MD   Objective:   Vitals:   04/28/18 1056  BP: (!) 144/74  Pulse: 68  SpO2: 98%    Physical Exam Vitals signs and nursing note reviewed.    Constitutional:      Appearance: Normal appearance.  HENT:     Head: Normocephalic.     Right Ear: External ear normal.     Left Ear: External ear normal.     Mouth/Throat:   Eyes:     Extraocular Movements: Extraocular movements intact.     Pupils: Pupils are equal, round, and reactive to light.  Neck:     Musculoskeletal: Normal range of motion.  Cardiovascular:     Rate and Rhythm: Normal rate.  Pulmonary:     Effort: Pulmonary effort is normal. No respiratory distress.  Abdominal:     General: Abdomen is flat. There is no distension.     Tenderness: There is no abdominal tenderness.  Neurological:     Mental Status: She is alert.  Psychiatric:        Mood and Affect: Mood normal.     Assessment & Plan:  History of stroke  Rheumatoid arthritis of multiple sites with negative rheumatoid factor (HCC)  Lip laceration, initial encounter  Recommend recreation of the laceration with excision of the foreign body or scar tissue and reapproximation of the muscle and lining up the vermilion border.  Due to the location and the patient's comprehension I think it would be better to do it in the OR.  Her daughter agrees. Silver socks recommended for her right ankle. Pictures in the chart  [place], DO

## 2018-04-29 DIAGNOSIS — I129 Hypertensive chronic kidney disease with stage 1 through stage 4 chronic kidney disease, or unspecified chronic kidney disease: Secondary | ICD-10-CM | POA: Diagnosis not present

## 2018-04-29 DIAGNOSIS — D631 Anemia in chronic kidney disease: Secondary | ICD-10-CM | POA: Diagnosis not present

## 2018-04-29 DIAGNOSIS — E1122 Type 2 diabetes mellitus with diabetic chronic kidney disease: Secondary | ICD-10-CM | POA: Diagnosis not present

## 2018-04-29 DIAGNOSIS — N183 Chronic kidney disease, stage 3 (moderate): Secondary | ICD-10-CM | POA: Diagnosis not present

## 2018-04-29 DIAGNOSIS — M353 Polymyalgia rheumatica: Secondary | ICD-10-CM | POA: Diagnosis not present

## 2018-04-29 DIAGNOSIS — M80041D Age-related osteoporosis with current pathological fracture, right hand, subsequent encounter for fracture with routine healing: Secondary | ICD-10-CM | POA: Diagnosis not present

## 2018-05-03 ENCOUNTER — Other Ambulatory Visit: Payer: Self-pay | Admitting: Rheumatology

## 2018-05-04 NOTE — Telephone Encounter (Addendum)
Last Visit: 02/10/18 Next Visit: 07/14/18 Labs: 04/08/18 Glucose is elevated-130. Rest of CMP WNL  Okay to refill per Dr. Estanislado Pandy

## 2018-05-06 DIAGNOSIS — I129 Hypertensive chronic kidney disease with stage 1 through stage 4 chronic kidney disease, or unspecified chronic kidney disease: Secondary | ICD-10-CM | POA: Diagnosis not present

## 2018-05-06 DIAGNOSIS — E1122 Type 2 diabetes mellitus with diabetic chronic kidney disease: Secondary | ICD-10-CM | POA: Diagnosis not present

## 2018-05-06 DIAGNOSIS — D631 Anemia in chronic kidney disease: Secondary | ICD-10-CM | POA: Diagnosis not present

## 2018-05-06 DIAGNOSIS — M353 Polymyalgia rheumatica: Secondary | ICD-10-CM | POA: Diagnosis not present

## 2018-05-06 DIAGNOSIS — M80041D Age-related osteoporosis with current pathological fracture, right hand, subsequent encounter for fracture with routine healing: Secondary | ICD-10-CM | POA: Diagnosis not present

## 2018-05-06 DIAGNOSIS — N183 Chronic kidney disease, stage 3 (moderate): Secondary | ICD-10-CM | POA: Diagnosis not present

## 2018-05-08 DIAGNOSIS — M353 Polymyalgia rheumatica: Secondary | ICD-10-CM | POA: Diagnosis not present

## 2018-05-08 DIAGNOSIS — M80041D Age-related osteoporosis with current pathological fracture, right hand, subsequent encounter for fracture with routine healing: Secondary | ICD-10-CM | POA: Diagnosis not present

## 2018-05-08 DIAGNOSIS — N183 Chronic kidney disease, stage 3 (moderate): Secondary | ICD-10-CM | POA: Diagnosis not present

## 2018-05-08 DIAGNOSIS — D631 Anemia in chronic kidney disease: Secondary | ICD-10-CM | POA: Diagnosis not present

## 2018-05-08 DIAGNOSIS — I129 Hypertensive chronic kidney disease with stage 1 through stage 4 chronic kidney disease, or unspecified chronic kidney disease: Secondary | ICD-10-CM | POA: Diagnosis not present

## 2018-05-08 DIAGNOSIS — E1122 Type 2 diabetes mellitus with diabetic chronic kidney disease: Secondary | ICD-10-CM | POA: Diagnosis not present

## 2018-05-14 DIAGNOSIS — H34811 Central retinal vein occlusion, right eye, with macular edema: Secondary | ICD-10-CM | POA: Diagnosis not present

## 2018-05-18 ENCOUNTER — Encounter (HOSPITAL_BASED_OUTPATIENT_CLINIC_OR_DEPARTMENT_OTHER): Payer: Self-pay | Admitting: *Deleted

## 2018-05-18 ENCOUNTER — Other Ambulatory Visit: Payer: Self-pay

## 2018-05-19 ENCOUNTER — Ambulatory Visit: Payer: Medicare Other

## 2018-05-20 LAB — CUP PACEART REMOTE DEVICE CHECK
Date Time Interrogation Session: 20200219023545
Implantable Pulse Generator Implant Date: 20180525

## 2018-05-21 ENCOUNTER — Ambulatory Visit (HOSPITAL_BASED_OUTPATIENT_CLINIC_OR_DEPARTMENT_OTHER)
Admission: RE | Admit: 2018-05-21 | Discharge: 2018-05-21 | Disposition: A | Discharge status: Home / Self Care | Payer: Medicare Other | Attending: Plastic Surgery | Admitting: Plastic Surgery

## 2018-05-21 ENCOUNTER — Ambulatory Visit (HOSPITAL_BASED_OUTPATIENT_CLINIC_OR_DEPARTMENT_OTHER): Payer: Medicare Other | Admitting: Certified Registered"

## 2018-05-21 ENCOUNTER — Encounter (HOSPITAL_BASED_OUTPATIENT_CLINIC_OR_DEPARTMENT_OTHER): Payer: Self-pay | Admitting: Certified Registered"

## 2018-05-21 ENCOUNTER — Encounter (HOSPITAL_BASED_OUTPATIENT_CLINIC_OR_DEPARTMENT_OTHER)
Admission: RE | Disposition: A | Discharge status: Home / Self Care | Payer: Self-pay | Source: Home / Self Care | Attending: Plastic Surgery

## 2018-05-21 ENCOUNTER — Other Ambulatory Visit: Payer: Self-pay

## 2018-05-21 DIAGNOSIS — S01511A Laceration without foreign body of lip, initial encounter: Secondary | ICD-10-CM | POA: Diagnosis not present

## 2018-05-21 DIAGNOSIS — M353 Polymyalgia rheumatica: Secondary | ICD-10-CM | POA: Diagnosis not present

## 2018-05-21 DIAGNOSIS — Z79899 Other long term (current) drug therapy: Secondary | ICD-10-CM | POA: Insufficient documentation

## 2018-05-21 DIAGNOSIS — Z7902 Long term (current) use of antithrombotics/antiplatelets: Secondary | ICD-10-CM | POA: Insufficient documentation

## 2018-05-21 DIAGNOSIS — L578 Other skin changes due to chronic exposure to nonionizing radiation: Secondary | ICD-10-CM | POA: Insufficient documentation

## 2018-05-21 DIAGNOSIS — E039 Hypothyroidism, unspecified: Secondary | ICD-10-CM | POA: Insufficient documentation

## 2018-05-21 DIAGNOSIS — L570 Actinic keratosis: Secondary | ICD-10-CM | POA: Insufficient documentation

## 2018-05-21 DIAGNOSIS — Z8673 Personal history of transient ischemic attack (TIA), and cerebral infarction without residual deficits: Secondary | ICD-10-CM | POA: Insufficient documentation

## 2018-05-21 DIAGNOSIS — Z87891 Personal history of nicotine dependence: Secondary | ICD-10-CM | POA: Diagnosis not present

## 2018-05-21 DIAGNOSIS — M069 Rheumatoid arthritis, unspecified: Secondary | ICD-10-CM | POA: Diagnosis not present

## 2018-05-21 DIAGNOSIS — Z881 Allergy status to other antibiotic agents status: Secondary | ICD-10-CM | POA: Insufficient documentation

## 2018-05-21 DIAGNOSIS — Z79890 Hormone replacement therapy (postmenopausal): Secondary | ICD-10-CM | POA: Diagnosis not present

## 2018-05-21 DIAGNOSIS — L905 Scar conditions and fibrosis of skin: Secondary | ICD-10-CM | POA: Diagnosis not present

## 2018-05-21 DIAGNOSIS — E785 Hyperlipidemia, unspecified: Secondary | ICD-10-CM | POA: Diagnosis not present

## 2018-05-21 HISTORY — PX: FACIAL LACERATION REPAIR: SHX6589

## 2018-05-21 LAB — POCT HEMOGLOBIN-HEMACUE: Hemoglobin: 12.1 g/dL (ref 12.0–15.0)

## 2018-05-21 LAB — BASIC METABOLIC PANEL
Anion gap: 8 (ref 5–15)
BUN: 12 mg/dL (ref 8–23)
CO2: 24 mmol/L (ref 22–32)
Calcium: 9.6 mg/dL (ref 8.9–10.3)
Chloride: 107 mmol/L (ref 98–111)
Creatinine, Ser: 0.63 mg/dL (ref 0.44–1.00)
GFR calc Af Amer: >60 mL/min (ref >60)
GFR calc non Af Amer: >60 mL/min (ref >60)
Glucose, Bld: 155 mg/dL — ABNORMAL HIGH (ref 70–99)
Potassium: 3.7 mmol/L (ref 3.5–5.1)
Sodium: 139 mmol/L (ref 135–145)

## 2018-05-21 SURGERY — REPAIR, LACERATION, FACE
Anesthesia: Monitor Anesthesia Care | Site: Mouth

## 2018-05-21 MED ORDER — WHITE PETROLATUM EX OINT
TOPICAL_OINTMENT | CUTANEOUS | Status: AC
  Filled 2018-05-21: qty 28.35

## 2018-05-21 MED ORDER — PROPOFOL 10 MG/ML IV BOLUS
INTRAVENOUS | Status: AC
  Filled 2018-05-21: qty 20

## 2018-05-21 MED ORDER — DEXAMETHASONE SODIUM PHOSPHATE 10 MG/ML IJ SOLN
INTRAMUSCULAR | Status: AC
  Filled 2018-05-21: qty 1

## 2018-05-21 MED ORDER — LIDOCAINE 2% (20 MG/ML) 5 ML SYRINGE
INTRAMUSCULAR | Status: DC | PRN
  Administered 2018-05-21: 40 mg via INTRAVENOUS

## 2018-05-21 MED ORDER — FENTANYL CITRATE (PF) 100 MCG/2ML IJ SOLN
INTRAMUSCULAR | Status: DC | PRN
  Administered 2018-05-21: 25 ug via INTRAVENOUS

## 2018-05-21 MED ORDER — ACETAMINOPHEN 325 MG PO TABS: 650.0000 mg | ORAL_TABLET | ORAL | Status: DC | PRN

## 2018-05-21 MED ORDER — SODIUM CHLORIDE 0.9% FLUSH: 3.0000 mL | Freq: Two times a day (BID) | INTRAVENOUS | Status: DC

## 2018-05-21 MED ORDER — SCOPOLAMINE 1 MG/3DAYS TD PT72: 1.0000 | MEDICATED_PATCH | Freq: Once | TRANSDERMAL | Status: DC | PRN

## 2018-05-21 MED ORDER — SODIUM CHLORIDE 0.9% FLUSH: 3.0000 mL | INTRAVENOUS | Status: DC | PRN

## 2018-05-21 MED ORDER — ONDANSETRON HCL 4 MG/2ML IJ SOLN
INTRAMUSCULAR | Status: AC
  Filled 2018-05-21: qty 2

## 2018-05-21 MED ORDER — CIPROFLOXACIN IN D5W 400 MG/200ML IV SOLN
400.0000 mg | INTRAVENOUS | Status: AC
  Administered 2018-05-21: 400 mg via INTRAVENOUS

## 2018-05-21 MED ORDER — ACETAMINOPHEN 650 MG RE SUPP: 650.0000 mg | RECTAL | Status: DC | PRN

## 2018-05-21 MED ORDER — MIDAZOLAM HCL 2 MG/2ML IJ SOLN: 1.0000 mg | INTRAMUSCULAR | Status: DC | PRN

## 2018-05-21 MED ORDER — FENTANYL CITRATE (PF) 100 MCG/2ML IJ SOLN
INTRAMUSCULAR | Status: AC
  Filled 2018-05-21: qty 2

## 2018-05-21 MED ORDER — PROPOFOL 10 MG/ML IV BOLUS
INTRAVENOUS | Status: DC | PRN
  Administered 2018-05-21: 10 mg via INTRAVENOUS
  Administered 2018-05-21: 5 mg via INTRAVENOUS

## 2018-05-21 MED ORDER — LIDOCAINE-EPINEPHRINE 1 %-1:100000 IJ SOLN
INTRAMUSCULAR | Status: DC | PRN
  Administered 2018-05-21: 3 mL

## 2018-05-21 MED ORDER — LACTATED RINGERS IV SOLN
INTRAVENOUS | Status: DC
  Administered 2018-05-21: 13:00:00 via INTRAVENOUS

## 2018-05-21 MED ORDER — FENTANYL CITRATE (PF) 100 MCG/2ML IJ SOLN: 25.0000 ug | INTRAMUSCULAR | Status: DC | PRN

## 2018-05-21 MED ORDER — CIPROFLOXACIN IN D5W 400 MG/200ML IV SOLN
INTRAVENOUS | Status: AC
  Filled 2018-05-21: qty 200

## 2018-05-21 MED ORDER — FENTANYL CITRATE (PF) 100 MCG/2ML IJ SOLN: 50.0000 ug | INTRAMUSCULAR | Status: DC | PRN

## 2018-05-21 MED ORDER — SODIUM CHLORIDE 0.9 % IV SOLN: 250.0000 mL | INTRAVENOUS | Status: DC | PRN

## 2018-05-21 MED ORDER — PROPOFOL 500 MG/50ML IV EMUL
INTRAVENOUS | Status: DC | PRN
  Administered 2018-05-21: 25 ug/kg/min via INTRAVENOUS

## 2018-05-21 MED ORDER — ONDANSETRON HCL 4 MG/2ML IJ SOLN
INTRAMUSCULAR | Status: DC | PRN
  Administered 2018-05-21: 4 mg via INTRAVENOUS

## 2018-05-21 MED ORDER — OXYCODONE HCL 5 MG PO TABS: 5.0000 mg | ORAL_TABLET | ORAL | Status: DC | PRN

## 2018-05-21 MED ORDER — MIDAZOLAM HCL 2 MG/2ML IJ SOLN
INTRAMUSCULAR | Status: AC
  Filled 2018-05-21: qty 2

## 2018-05-21 SURGICAL SUPPLY — 59 items
ADH SKN CLS APL DERMABOND .7 (GAUZE/BANDAGES/DRESSINGS) ×1
BENZOIN TINCTURE PRP APPL 2/3 (GAUZE/BANDAGES/DRESSINGS) IMPLANT
BLADE CLIPPER SURG (BLADE) IMPLANT
BLADE SURG 15 STRL LF DISP TIS (BLADE) ×2 IMPLANT
BLADE SURG 15 STRL SS (BLADE) ×1
BNDG CONFORM 2 STRL LF (GAUZE/BANDAGES/DRESSINGS) IMPLANT
BNDG ELASTIC 2X5.8 VLCR STR LF (GAUZE/BANDAGES/DRESSINGS) IMPLANT
CANISTER SUCT 1200ML W/VALVE (MISCELLANEOUS) IMPLANT
CLEANER CAUTERY TIP 5X5 PAD (MISCELLANEOUS) IMPLANT
CORD BIPOLAR FORCEPS 12FT (ELECTRODE) IMPLANT
COVER BACK TABLE 60X90IN (DRAPES) ×3 IMPLANT
COVER MAYO STAND STRL (DRAPES) ×3 IMPLANT
COVER WAND RF STERILE (DRAPES) IMPLANT
DERMABOND ADVANCED (GAUZE/BANDAGES/DRESSINGS) ×1
DERMABOND ADVANCED .7 DNX12 (GAUZE/BANDAGES/DRESSINGS) ×2 IMPLANT
DRAPE U-SHAPE 76X120 STRL (DRAPES) ×3 IMPLANT
ELECT COATED BLADE 2.86 ST (ELECTRODE) IMPLANT
ELECT NEEDLE BLADE 2-5/6 (NEEDLE) ×3 IMPLANT
ELECT REM PT RETURN 9FT ADLT (ELECTROSURGICAL) ×3
ELECT REM PT RETURN 9FT PED (ELECTROSURGICAL)
ELECTRODE REM PT RETRN 9FT PED (ELECTROSURGICAL) IMPLANT
ELECTRODE REM PT RTRN 9FT ADLT (ELECTROSURGICAL) ×2 IMPLANT
GAUZE SPONGE 4X4 12PLY STRL LF (GAUZE/BANDAGES/DRESSINGS) IMPLANT
GAUZE XEROFORM 1X8 LF (GAUZE/BANDAGES/DRESSINGS) IMPLANT
GLOVE BIO SURGEON STRL SZ 6.5 (GLOVE) ×6 IMPLANT
GLOVE BIO SURGEON STRL SZ7 (GLOVE) ×3 IMPLANT
GLOVE BIO SURGEON STRL SZ7.5 (GLOVE) ×3 IMPLANT
GLOVE BIOGEL PI IND STRL 7.5 (GLOVE) ×2 IMPLANT
GLOVE BIOGEL PI IND STRL 8 (GLOVE) ×2 IMPLANT
GLOVE BIOGEL PI INDICATOR 7.5 (GLOVE) ×1
GLOVE BIOGEL PI INDICATOR 8 (GLOVE) ×1
GOWN STRL REUS W/ TWL LRG LVL3 (GOWN DISPOSABLE) ×8 IMPLANT
GOWN STRL REUS W/TWL LRG LVL3 (GOWN DISPOSABLE) ×4
NEEDLE HYPO 30GX1 BEV (NEEDLE) ×3 IMPLANT
NEEDLE PRECISIONGLIDE 27X1.5 (NEEDLE) IMPLANT
NS IRRIG 1000ML POUR BTL (IV SOLUTION) IMPLANT
PACK BASIN DAY SURGERY FS (CUSTOM PROCEDURE TRAY) ×3 IMPLANT
PAD CLEANER CAUTERY TIP 5X5 (MISCELLANEOUS)
PENCIL BUTTON HOLSTER BLD 10FT (ELECTRODE) ×3 IMPLANT
RUBBERBAND STERILE (MISCELLANEOUS) IMPLANT
SHEET MEDIUM DRAPE 40X70 STRL (DRAPES) IMPLANT
SPONGE GAUZE 2X2 8PLY STRL LF (GAUZE/BANDAGES/DRESSINGS) IMPLANT
STRIP CLOSURE SKIN 1/2X4 (GAUZE/BANDAGES/DRESSINGS) IMPLANT
SUCTION FRAZIER HANDLE 10FR (MISCELLANEOUS)
SUCTION TUBE FRAZIER 10FR DISP (MISCELLANEOUS) IMPLANT
SUT CHROMIC 4 0 P 3 18 (SUTURE) IMPLANT
SUT ETHILON 4 0 PS 2 18 (SUTURE) IMPLANT
SUT ETHILON 5 0 P 3 18 (SUTURE)
SUT MNCRL AB 4-0 PS2 18 (SUTURE) IMPLANT
SUT MON AB 5-0 P3 18 (SUTURE) IMPLANT
SUT NYLON ETHILON 5-0 P-3 1X18 (SUTURE) IMPLANT
SUT PLAIN 5 0 P 3 18 (SUTURE) IMPLANT
SUT VIC AB 5-0 P-3 18X BRD (SUTURE) IMPLANT
SUT VIC AB 5-0 P3 18 (SUTURE)
SUT VICRYL 4-0 PS2 18IN ABS (SUTURE) IMPLANT
SYR BULB 3OZ (MISCELLANEOUS) IMPLANT
SYR CONTROL 10ML LL (SYRINGE) ×3 IMPLANT
TRAY DSU PREP LF (CUSTOM PROCEDURE TRAY) ×3 IMPLANT
TUBE CONNECTING 20X1/4 (TUBING) IMPLANT

## 2018-05-21 NOTE — Anesthesia Procedure Notes (Signed)
Procedure Name: MAC Date/Time: 05/21/2018 12:36 PM Performed by: Cynda Familia, CRNA Pre-anesthesia Checklist: Patient identified, Emergency Drugs available, Suction available, Patient being monitored and Timeout performed Patient Re-evaluated:Patient Re-evaluated prior to induction Oxygen Delivery Method: Nasal cannula Placement Confirmation: positive ETCO2 and breath sounds checked- equal and bilateral Dental Injury: Teeth and Oropharynx as per pre-operative assessment

## 2018-05-21 NOTE — Anesthesia Postprocedure Evaluation (Signed)
Anesthesia Post Note  Patient: Beverly Estrada  Procedure(s) Performed: FACIAL LACERATION REPAIR (N/A Face)     Patient location during evaluation: PACU Anesthesia Type: MAC Level of consciousness: awake Pain management: pain level controlled Vital Signs Assessment: post-procedure vital signs reviewed and stable Respiratory status: spontaneous breathing Cardiovascular status: stable Postop Assessment: no apparent nausea or vomiting Anesthetic complications: no    Last Vitals:  Vitals:   05/21/18 1330 05/21/18 1345  BP: (!) 141/56 (!) 134/52  Pulse: (!) 58 (!) 59  Resp: (!) 9 14  Temp:    SpO2: 100% 100%    Last Pain:  Vitals:   05/21/18 1345  TempSrc:   PainSc: 0-No pain                 Telsa Dillavou

## 2018-05-21 NOTE — Discharge Instructions (Signed)
°  Post Anesthesia Home Care Instructions  Activity: Get plenty of rest for the remainder of the day. A responsible individual must stay with you for 24 hours following the procedure.  For the next 24 hours, DO NOT: -Drive a car -Paediatric nurse -Drink alcoholic beverages -Take any medication unless instructed by your physician -Make any legal decisions or sign important papers.  Meals: Start with liquid foods such as gelatin or soup. Progress to regular foods as tolerated. Avoid greasy, spicy, heavy foods. If nausea and/or vomiting occur, drink only clear liquids until the nausea and/or vomiting subsides. Call your physician if vomiting continues.  Special Instructions/Symptoms: Your throat may feel dry or sore from the anesthesia or the breathing tube placed in your throat during surgery. If this causes discomfort, gargle with warm salt water. The discomfort should disappear within 24 hours.  If you had a scopolamine patch placed behind your ear for the management of post- operative nausea and/or vomiting:  1. The medication in the patch is effective for 72 hours, after which it should be removed.  Wrap patch in a tissue and discard in the trash. Wash hands thoroughly with soap and water. 2. You may remove the patch earlier than 72 hours if you experience unpleasant side effects which may include dry mouth, dizziness or visual disturbances. 3. Avoid touching the patch. Wash your hands with soap and water after contact with the patch.       May shower. No heavy lifting. Cold drinks. Avoid hot food for 6 hours so you don't get burned.

## 2018-05-21 NOTE — Transfer of Care (Signed)
Immediate Anesthesia Transfer of Care Note  Patient: Beverly Estrada  Procedure(s) Performed: FACIAL LACERATION REPAIR (N/A Face)  Patient Location: PACU  Anesthesia Type:MAC  Level of Consciousness: sedated  Airway & Oxygen Therapy: Patient Spontanous Breathing and Patient connected to nasal cannula oxygen  Post-op Assessment: Report given to RN and Post -op Vital signs reviewed and stable  Post vital signs: Reviewed and stable  Last Vitals:  Vitals Value Taken Time  BP 144/59 05/21/2018  1:25 PM  Temp    Pulse 58 05/21/2018  1:27 PM  Resp 11 05/21/2018  1:27 PM  SpO2 100 % 05/21/2018  1:27 PM  Vitals shown include unvalidated device data.  Last Pain:  Vitals:   05/21/18 1118  TempSrc: Oral  PainSc: 3       Patients Stated Pain Goal: 3 (00/34/91 7915)  Complications: No apparent anesthesia complications

## 2018-05-21 NOTE — Interval H&P Note (Signed)
History and Physical Interval Note:  05/21/2018 10:44 AM  Beverly Estrada  has presented today for surgery, with the diagnosis of upper lip laceration  The various methods of treatment have been discussed with the patient and family. After consideration of risks, benefits and other options for treatment, the patient has consented to  Procedure(s) with comments: Repair of upper lip laceration skin and muscle (N/A) - 45 minutes for case, please as a surgical intervention .  The patient's history has been reviewed, patient examined, no change in status, stable for surgery.  I have reviewed the patient's chart and labs.  Questions were answered to the patient's satisfaction.     Loel Lofty Dillingham

## 2018-05-21 NOTE — Op Note (Signed)
DATE OF OPERATION: 05/21/2018  LOCATION: [facility]  PREOPERATIVE DIAGNOSIS: Upper lip laceration with contracture, changing skin lesion  POSTOPERATIVE DIAGNOSIS: Same  PROCEDURE:  1. Excision of upper lip scar tissue and contracture 1 cm with repair of obicularis oris muscle. 2. Excision of 5 mm changing skin lesion of upper lip  SURGEON: Claire Sanger Dillingham, DO  ASSISTANT: Ronette Deter, PA  EBL: none  CONDITION: Stable  COMPLICATIONS: None  INDICATION: The patient, Beverly Estrada, is a 85 y.o. female born on [birth year], is here for treatment of a laceration of the upper lip.   PROCEDURE DETAILS:  The patient was seen prior to surgery and marked.  The IV antibiotics were given. The patient was taken to the operating room and given anesthetic. A standard time out was performed and all information was confirmed by those in the room. SCDs were placed.   The lip was prepped and draped and then marked.  Local was injected for intraoperative hemostasis and post operative pain control. The #15 blade was used to make an incision at the site of there previous laceration site.  There was a 1 cm area of scar tissue under the red portion of the lip.  It appeared to be between the muscle fibers.  This was excised with the #15 blade and bovie.  Hemostasis was achieved with electrocautery.  The obicularis muscle was released laterally in both directions.  The 5-0 Vicryl was brought together and sutured with the vertical mattress sutures.    The upper lip left 5 mm changing skin lesion was excised with the #15 blade.  The 6-0 Vicryl was used to close the skin edges with vertical mattress sutures and a simple interrupted suture. The patient was allowed to wake up and taken to recovery room in stable condition at the end of the case. The family was notified at the end of the case.   The advanced practice practitioner (APP) assisted throughout the case.  The APP was essential in  retraction and counter traction when needed to make the case progress smoothly.  This retraction and assistance made it possible to see the tissue plans for the procedure.  The assistance was needed for blood control, tissue re-approximation and assisted with closure of the incision site.

## 2018-05-21 NOTE — Anesthesia Preprocedure Evaluation (Addendum)
Anesthesia Evaluation  Patient identified by MRN, date of birth, ID band Patient awake    Reviewed: Allergy & Precautions, NPO status , Patient's Chart, lab work & pertinent test results  Airway Mallampati: II  TM Distance: >3 FB     Dental   Pulmonary former smoker,    breath sounds clear to auscultation       Cardiovascular negative cardio ROS   Rhythm:Regular Rate:Normal     Neuro/Psych    GI/Hepatic Neg liver ROS,   Endo/Other  Hypothyroidism   Renal/GU negative Renal ROS     Musculoskeletal  (+) Arthritis ,   Abdominal   Peds  Hematology   Anesthesia Other Findings   Reproductive/Obstetrics                            Anesthesia Physical Anesthesia Plan  ASA: II  Anesthesia Plan: MAC   Post-op Pain Management:    Induction: Intravenous  PONV Risk Score and Plan: Ondansetron  Airway Management Planned: LMA, Nasal Cannula and Simple Face Mask  Additional Equipment:   Intra-op Plan:   Post-operative Plan:   Informed Consent: I have reviewed the patients History and Physical, chart, labs and discussed the procedure including the risks, benefits and alternatives for the proposed anesthesia with the patient or authorized representative who has indicated his/her understanding and acceptance.     Dental advisory given  Plan Discussed with: Anesthesiologist, CRNA and Surgeon  Anesthesia Plan Comments:        Anesthesia Quick Evaluation

## 2018-05-22 ENCOUNTER — Encounter (HOSPITAL_BASED_OUTPATIENT_CLINIC_OR_DEPARTMENT_OTHER): Payer: Self-pay | Admitting: Plastic Surgery

## 2018-05-29 ENCOUNTER — Encounter: Payer: Self-pay | Admitting: Plastic Surgery

## 2018-05-29 ENCOUNTER — Ambulatory Visit (INDEPENDENT_AMBULATORY_CARE_PROVIDER_SITE_OTHER): Payer: Medicare Other | Admitting: Plastic Surgery

## 2018-05-29 VITALS — BP 144/69 | HR 69 | Temp 97.6°F | Ht 65.0 in | Wt 145.8 lb

## 2018-05-29 DIAGNOSIS — L570 Actinic keratosis: Secondary | ICD-10-CM

## 2018-05-29 DIAGNOSIS — S01511A Laceration without foreign body of lip, initial encounter: Secondary | ICD-10-CM

## 2018-05-29 NOTE — Progress Notes (Signed)
   Subjective:    Patient ID: Beverly Estrada, female    DOB: Jun 20, 1929, 85 y.o.   MRN: [id number]  The patient is an 85 year old female here for follow-up on her lip surgery.  The medial aspect was scar tissue and is healing very nicely.  The lateral aspect was an actinic keratosis and is also healing nicely.  I have recommended that she not bite into anything but be particularly careful.   Review of Systems  Constitutional: Negative.   HENT: Negative.   Eyes: Negative.   Respiratory: Negative.   Cardiovascular: Negative.   Gastrointestinal: Negative.   Genitourinary: Negative.   Musculoskeletal: Negative.   Psychiatric/Behavioral: Negative.        Objective:   Physical Exam Vitals signs and nursing note reviewed.  Constitutional:      Appearance: Normal appearance.  HENT:     Head: Normocephalic.  Cardiovascular:     Pulses: Normal pulses.  Pulmonary:     Effort: Pulmonary effort is normal.  Neurological:     Mental Status: She is alert.  Psychiatric:        Mood and Affect: Mood normal.        Assessment & Plan:  Lip laceration, initial encounter  Actinic keratosis The sutures were removed.  Recommend not biting into anything too big for another week.  Vaseline to the area daily.  Follow-up as needed.

## 2018-06-11 DIAGNOSIS — H353132 Nonexudative age-related macular degeneration, bilateral, intermediate dry stage: Secondary | ICD-10-CM | POA: Diagnosis not present

## 2018-06-11 DIAGNOSIS — H34811 Central retinal vein occlusion, right eye, with macular edema: Secondary | ICD-10-CM | POA: Diagnosis not present

## 2018-06-11 DIAGNOSIS — H35373 Puckering of macula, bilateral: Secondary | ICD-10-CM | POA: Diagnosis not present

## 2018-06-12 DIAGNOSIS — Z1231 Encounter for screening mammogram for malignant neoplasm of breast: Secondary | ICD-10-CM | POA: Diagnosis not present

## 2018-06-22 ENCOUNTER — Ambulatory Visit (INDEPENDENT_AMBULATORY_CARE_PROVIDER_SITE_OTHER): Payer: Medicare Other | Admitting: *Deleted

## 2018-06-22 DIAGNOSIS — I639 Cerebral infarction, unspecified: Secondary | ICD-10-CM

## 2018-06-23 ENCOUNTER — Other Ambulatory Visit: Payer: Self-pay

## 2018-06-23 LAB — CUP PACEART REMOTE DEVICE CHECK
Date Time Interrogation Session: 20200323034007
Implantable Pulse Generator Implant Date: 20180525

## 2018-06-24 ENCOUNTER — Ambulatory Visit: Payer: Medicare Other | Admitting: Plastic Surgery

## 2018-06-30 NOTE — Progress Notes (Signed)
Carelink Summary Report / Loop Recorder 

## 2018-07-02 ENCOUNTER — Ambulatory Visit: Payer: Medicare Other | Admitting: Plastic Surgery

## 2018-07-14 ENCOUNTER — Ambulatory Visit: Payer: Medicare Other | Admitting: Rheumatology

## 2018-07-23 ENCOUNTER — Other Ambulatory Visit: Payer: Self-pay | Admitting: Rheumatology

## 2018-07-23 NOTE — Telephone Encounter (Signed)
Last Visit: 02/10/18 Next Visit: 09/21/18 Labs:04/08/18 Glucose is elevated-130. Rest of CMP WNL  Left message to advise patient she is due to update labs.   Okay to refill per Dr. Estanislado Pandy

## 2018-07-27 ENCOUNTER — Other Ambulatory Visit: Payer: Self-pay

## 2018-07-27 ENCOUNTER — Ambulatory Visit (INDEPENDENT_AMBULATORY_CARE_PROVIDER_SITE_OTHER): Payer: Medicare Other | Admitting: *Deleted

## 2018-07-27 DIAGNOSIS — I639 Cerebral infarction, unspecified: Secondary | ICD-10-CM | POA: Diagnosis not present

## 2018-07-27 DIAGNOSIS — I4891 Unspecified atrial fibrillation: Secondary | ICD-10-CM

## 2018-07-27 LAB — CUP PACEART REMOTE DEVICE CHECK
Date Time Interrogation Session: 20200425033649
Implantable Pulse Generator Implant Date: 20180525

## 2018-08-04 NOTE — Progress Notes (Signed)
Carelink Summary Report / Loop Recorder 

## 2018-08-27 ENCOUNTER — Ambulatory Visit (INDEPENDENT_AMBULATORY_CARE_PROVIDER_SITE_OTHER): Payer: Medicare Other | Admitting: *Deleted

## 2018-08-27 DIAGNOSIS — I639 Cerebral infarction, unspecified: Secondary | ICD-10-CM | POA: Diagnosis not present

## 2018-08-27 LAB — CUP PACEART REMOTE DEVICE CHECK
Date Time Interrogation Session: 20200528041224
Implantable Pulse Generator Implant Date: 20180525

## 2018-09-03 NOTE — Progress Notes (Signed)
Carelink Summary Report / Loop Recorder 

## 2018-09-04 ENCOUNTER — Ambulatory Visit: Payer: Medicare Other | Admitting: Plastic Surgery

## 2018-09-07 NOTE — Progress Notes (Deleted)
Office Visit Note  Patient: Beverly Estrada             Date of Birth: Feb 14, 1930           MRN: [id number]             PCP: Ernestene Kiel, MD Referring: Ernestene Kiel, MD Visit Date: 09/21/2018 Occupation: @GUAROCC @  Subjective:  No chief complaint on file.   History of Present Illness: Beverly Estrada is a 85 y.o. female ***   Activities of Daily Living:  Patient reports morning stiffness for *** {minute/hour:19697}.   Patient {ACTIONS;DENIES/REPORTS:21021675::"Denies"} nocturnal pain.  Difficulty dressing/grooming: {ACTIONS;DENIES/REPORTS:21021675::"Denies"} Difficulty climbing stairs: {ACTIONS;DENIES/REPORTS:21021675::"Denies"} Difficulty getting out of chair: {ACTIONS;DENIES/REPORTS:21021675::"Denies"} Difficulty using hands for taps, buttons, cutlery, and/or writing: {ACTIONS;DENIES/REPORTS:21021675::"Denies"}  No Rheumatology ROS completed.   PMFS History:  Patient Active Problem List   Diagnosis Date Noted  . Actinic keratosis 05/29/2018  . Lip laceration 04/28/2018  . Trigger index finger of left hand 09/16/2017  . History of stroke 05/01/2017  . Hyperlipidemia 05/01/2017  . Vision loss of right eye 10/07/2016  . Medication monitoring encounter 07/02/2016  . Trochanteric bursitis of both hips 07/02/2016  . High risk medication use 07/02/2016  . Osteopenia 02/01/2016  . RA (rheumatoid arthritis) ([place]) 01/30/2016  . Osteoarthritis of both knees 01/30/2016  . Osteoarthritis of both feet 01/30/2016  . DDD (degenerative disc disease), lumbar 01/30/2016  . Primary osteoarthritis of right hip 01/30/2016  . Depression 01/30/2016  . Choledocholithiasis 03/13/2015  . Hypothyroidism 03/13/2015  . Polymyalgia rheumatica ([place]) 03/13/2015    Past Medical History:  Diagnosis Date  . Arthritis   . DDD (degenerative disc disease), lumbar 01/30/2016  . Glaucoma   . Hypothyroidism   . Multiple drug allergies    to multiple antibiotics.   . Osteoarthritis  of both feet 01/30/2016  . Osteoarthritis of both hands 01/30/2016  . Osteoarthritis of both knees 01/30/2016  . Osteoarthritis of right hand 01/30/2016   moderate  . Polymyalgia rheumatica ([place])   . RA (rheumatoid arthritis) ([place]) 01/30/2016   Sero Negative  . Skin cancer of nose   . Stroke [facility])    right eye     Family History  Problem Relation Age of Onset  . Diabetes Mellitus II Mother   . Stroke Mother   . Diabetes Mellitus II Brother   . Stroke Brother   . Diabetes Brother   . Diabetes Sister   . Diabetes Sister   . Diabetes Sister   . Diabetes Sister   . Diabetes Brother   . Diabetes Brother   . Diabetes Brother   . Diabetes Brother   . Diabetes Brother    Past Surgical History:  Procedure Laterality Date  . APPENDECTOMY    . CHOLECYSTECTOMY    . COLONOSCOPY  ~2013   pooyps removed, one of them large per pt. Polyp pathology unclear. no rescreening study planned due to age  . FACIAL LACERATION REPAIR N/A 05/21/2018   Procedure: FACIAL LACERATION REPAIR;  Surgeon: Wallace Going, DO;  Location: [place];  Service: Plastics;  Laterality: N/A;  . LOOP RECORDER INSERTION N/A 08/23/2016   Procedure: Loop Recorder Insertion;  Surgeon: Thompson Grayer, MD;  Location: [place] [facility];  Service: Cardiovascular;  Laterality: N/A;  . TEE WITHOUT CARDIOVERSION N/A 08/23/2016   Procedure: TRANSESOPHAGEAL ECHOCARDIOGRAM (TEE);  Surgeon: Larey Dresser, MD;  Location: [place];  Service: Cardiovascular;  Laterality: N/A;  . TONSIL SURGERY     Social  History   Social History Narrative  . Not on file   Immunization History  Administered Date(s) Administered  . Pneumococcal Conjugate-13 09/13/2014, 01/23/2017  . Pneumococcal Polysaccharide-23 04/02/1999  . Tdap 09/18/2009  . Zoster 08/06/2007     Objective: Vital Signs: There were no vitals taken for this visit.   Physical Exam   Musculoskeletal Exam: ***  CDAI Exam: CDAI Score: Not  documented Patient Global Assessment: Not documented; Provider Global Assessment: Not documented Swollen: Not documented; Tender: Not documented Joint Exam   Not documented   There is currently no information documented on the homunculus. Go to the Rheumatology activity and complete the homunculus joint exam.  Investigation: No additional findings.  Imaging: No results found.  Recent Labs: Lab Results  Component Value Date   WBC 8.6 04/08/2018   HGB 12.1 05/21/2018   PLT 198 04/08/2018   NA 139 05/21/2018   K 3.7 05/21/2018   CL 107 05/21/2018   CO2 24 05/21/2018   GLUCOSE 155 (H) 05/21/2018   BUN 12 05/21/2018   CREATININE 0.63 05/21/2018   BILITOT 0.8 04/08/2018   ALKPHOS 56 10/08/2016   AST 15 04/08/2018   ALT 9 04/08/2018   PROT 6.7 04/08/2018   ALBUMIN 4.2 10/08/2016   CALCIUM 9.6 05/21/2018   GFRAA >60 05/21/2018    Speciality Comments: No specialty comments available.  Procedures:  No procedures performed Allergies: Biaxin [clarithromycin]; Augmentin [amoxicillin-pot clavulanate]; and Clindamycin/lincomycin   Assessment / Plan:     Visit Diagnoses: No diagnosis found.   Orders: No orders of the defined types were placed in this encounter.  No orders of the defined types were placed in this encounter.   Face-to-face time spent with patient was *** minutes. Greater than 50% of time was spent in counseling and coordination of care.  Follow-Up Instructions: No follow-ups on file.   Earnestine Mealing, CMA  Note - This record has been created using Editor, commissioning.  Chart creation errors have been sought, but may not always  have been located. Such creation errors do not reflect on  the standard of medical care.

## 2018-09-16 ENCOUNTER — Telehealth: Payer: Self-pay | Admitting: Rheumatology

## 2018-09-16 NOTE — Telephone Encounter (Signed)
Patient's daughter Beverly Estrada called stating Beverly Estrada had to cancel her appointment on 09/21/18 due to not having transportation.  Beverly Estrada states she is not sure if her mom would be able to have a virtual appointment through webex since her mom, as well as nurse aid does not have a lot of experience with the computer either.  Beverly Estrada is not sure if a phone visit will give Dr. Estanislado Pandy enough information since she cannot see her mom.  Beverly Estrada states her mom is on the cancellation list for 6/29, 6/30 because her sister might be able to bring her those days, and 7/17 because she will be available to bring her.

## 2018-09-16 NOTE — Telephone Encounter (Signed)
Noted, thank you

## 2018-09-20 ENCOUNTER — Other Ambulatory Visit: Payer: Self-pay | Admitting: Rheumatology

## 2018-09-21 ENCOUNTER — Ambulatory Visit: Payer: Self-pay | Admitting: Rheumatology

## 2018-09-21 NOTE — Telephone Encounter (Signed)
Last Visit: 02/10/18 Next Visit: 10/21/18  Okay to refill per Dr. Estanislado Pandy

## 2018-09-22 DIAGNOSIS — E1169 Type 2 diabetes mellitus with other specified complication: Secondary | ICD-10-CM | POA: Diagnosis not present

## 2018-09-22 DIAGNOSIS — E785 Hyperlipidemia, unspecified: Secondary | ICD-10-CM | POA: Diagnosis not present

## 2018-09-22 DIAGNOSIS — E039 Hypothyroidism, unspecified: Secondary | ICD-10-CM | POA: Diagnosis not present

## 2018-09-22 DIAGNOSIS — Z6825 Body mass index (BMI) 25.0-25.9, adult: Secondary | ICD-10-CM | POA: Diagnosis not present

## 2018-09-22 DIAGNOSIS — Z8673 Personal history of transient ischemic attack (TIA), and cerebral infarction without residual deficits: Secondary | ICD-10-CM | POA: Diagnosis not present

## 2018-09-25 DIAGNOSIS — Z79899 Other long term (current) drug therapy: Secondary | ICD-10-CM | POA: Diagnosis not present

## 2018-09-25 DIAGNOSIS — E039 Hypothyroidism, unspecified: Secondary | ICD-10-CM | POA: Diagnosis not present

## 2018-09-25 DIAGNOSIS — E785 Hyperlipidemia, unspecified: Secondary | ICD-10-CM | POA: Diagnosis not present

## 2018-09-25 DIAGNOSIS — E1169 Type 2 diabetes mellitus with other specified complication: Secondary | ICD-10-CM | POA: Diagnosis not present

## 2018-09-25 DIAGNOSIS — Z8739 Personal history of other diseases of the musculoskeletal system and connective tissue: Secondary | ICD-10-CM | POA: Diagnosis not present

## 2018-09-29 ENCOUNTER — Ambulatory Visit (INDEPENDENT_AMBULATORY_CARE_PROVIDER_SITE_OTHER): Payer: Medicare Other | Admitting: *Deleted

## 2018-09-29 DIAGNOSIS — I639 Cerebral infarction, unspecified: Secondary | ICD-10-CM | POA: Diagnosis not present

## 2018-09-29 LAB — CUP PACEART REMOTE DEVICE CHECK
Date Time Interrogation Session: 20200630114151
Implantable Pulse Generator Implant Date: 20180525

## 2018-10-09 NOTE — Progress Notes (Deleted)
Virtual Visit via Telephone Note  I connected with Beverly Estrada on 10/09/18 at  1:30 PM EDT by telephone and verified that I am speaking with the correct person using two identifiers.  Location: Patient: Home Provider: Clinic    I discussed the limitations, risks, security and privacy concerns of performing an evaluation and management service by telephone and the availability of in person appointments. I also discussed with the patient that there may be a patient responsible charge related to this service. The patient expressed understanding and agreed to proceed. This service was conducted via virtual visit.The patient was located at home. I was located in my office.  Consent was obtained prior to the virtual visit and is aware of possible charges through their insurance for this visit.  The patient is an established patient.  Dr. Estanislado Pandy, MD conducted the virtual visit. Office staff helped with scheduling follow up visits after the service was conducted.   CC: History of Present Illness: Patient is a 85 year old female with a history of seronegative rheumatoid arthritis and PMR.  She is taking MTX 3 tablets po once weekly.    Review of Systems  Constitutional: Negative for malaise/fatigue.  HENT: Negative for congestion.   Eyes: Negative for pain.  Respiratory: Negative for shortness of breath.   Cardiovascular: Negative for leg swelling.  Gastrointestinal: Negative for constipation.  Genitourinary: Negative for frequency.  Musculoskeletal: Negative for joint pain.  Skin: Negative for rash.  Neurological: Negative for weakness.  Endo/Heme/Allergies: Does not bruise/bleed easily.  Psychiatric/Behavioral: Negative for depression.    Observations/Objective: Physical Exam  Constitutional: She is oriented to person, place, and time.  Neurological: She is alert and oriented to person, place, and time.  Psychiatric: Mood, memory, affect and judgment normal.   Patient reports  morning stiffness for 24 hours.   Patient denies nocturnal pain.  Difficulty dressing/grooming: Denies Difficulty climbing stairs: Denies Difficulty getting out of chair: Reports Difficulty using hands for taps, buttons, cutlery, and/or writing: Denies   Assessment and Plan: Visit Diagnoses: Rheumatoid arthritis of multiple sites with negative rheumatoid factor (HCC)-  High risk medication use - Current regimen includes methotrexate 7.5 mg po weekly, folic acid 1 mg po daily, and fish oil 1000 mg po daily.   Primary osteoarthritis of right hip-she has good range of motion.  Primary osteoarthritis of both knees-she is currently not having any discomfort or swelling.  Primary osteoarthritis of both feet-proper fitting shoes were discussed.  DDD (degenerative disc disease), lumbar - She has seen Dr. Ernestina Patches in the past for injections.  She continues to have lower back pain.  I will refer her to physical therapy for lower back pain due to degenerative disc disease.  She is also had history of fall.  Lower extremity muscle strengthening for fall prevention was added to physical therapy.  Polymyalgia rheumatica (HCC)-patient has no muscular weakness or tenderness on examination.  She appears to be in remission.  Osteopenia of multiple sites - DXA is  done by her PCP.  History of depression-related to her eye issues.  History of stroke - With subacute memory loss followed up by Dr. Leonie Man.  Vision loss of right eye  History of hypothyroidism   Follow Up Instructions: She will follow up in    I discussed the assessment and treatment plan with the patient. The patient was provided an opportunity to ask questions and all were answered. The patient agreed with the plan and demonstrated an understanding of the instructions.  The patient was advised to call back or seek an in-person evaluation if the symptoms worsen or if the condition fails to improve as anticipated.  I provided  *** minutes of non-face-to-face time during this encounter.   Ofilia Neas, PA-C

## 2018-10-10 NOTE — Progress Notes (Signed)
Carelink Summary Report / Loop Recorder 

## 2018-10-15 ENCOUNTER — Telehealth: Payer: Self-pay | Admitting: Plastic Surgery

## 2018-10-15 DIAGNOSIS — M810 Age-related osteoporosis without current pathological fracture: Secondary | ICD-10-CM | POA: Diagnosis not present

## 2018-10-15 NOTE — Telephone Encounter (Signed)
Called patient's daughter to confirm appointment scheduled for tomorrow. Patient's daughter answered the following questions: 1. To the best of your knowledge, have you been in close contact with any one with a confirmed diagnosis of COVID-19? No 2. Have you had any one or more of the following; fever, chills, cough, shortness of breath, or any flu-like symptoms? No 3. Have you been diagnosed with or have a previous diagnosis of COVID 19? No 4. I am going to go over a few other symptoms with you. Please let me know if you are experiencing any of the following: None of the below a. Ear, nose, or throat discomfort b. A sore throat c. Headache d. Muscle pain e. Diarrhea f. Loss of taste or smell

## 2018-10-16 ENCOUNTER — Other Ambulatory Visit: Payer: Self-pay | Admitting: Rheumatology

## 2018-10-16 ENCOUNTER — Ambulatory Visit (INDEPENDENT_AMBULATORY_CARE_PROVIDER_SITE_OTHER): Payer: Medicare Other | Admitting: Plastic Surgery

## 2018-10-16 ENCOUNTER — Encounter: Payer: Self-pay | Admitting: Rheumatology

## 2018-10-16 ENCOUNTER — Other Ambulatory Visit: Payer: Self-pay

## 2018-10-16 ENCOUNTER — Encounter: Payer: Self-pay | Admitting: Surgical

## 2018-10-16 ENCOUNTER — Encounter: Payer: Self-pay | Admitting: Plastic Surgery

## 2018-10-16 ENCOUNTER — Ambulatory Visit (INDEPENDENT_AMBULATORY_CARE_PROVIDER_SITE_OTHER): Payer: Medicare Other | Admitting: Rheumatology

## 2018-10-16 VITALS — BP 132/61 | HR 64 | Temp 98.0°F | Ht 66.0 in | Wt 146.8 lb

## 2018-10-16 VITALS — BP 125/64 | HR 61 | Resp 18 | Ht 65.0 in | Wt 148.4 lb

## 2018-10-16 DIAGNOSIS — Z8639 Personal history of other endocrine, nutritional and metabolic disease: Secondary | ICD-10-CM | POA: Diagnosis not present

## 2018-10-16 DIAGNOSIS — M8589 Other specified disorders of bone density and structure, multiple sites: Secondary | ICD-10-CM | POA: Diagnosis not present

## 2018-10-16 DIAGNOSIS — M170 Bilateral primary osteoarthritis of knee: Secondary | ICD-10-CM | POA: Diagnosis not present

## 2018-10-16 DIAGNOSIS — M0609 Rheumatoid arthritis without rheumatoid factor, multiple sites: Secondary | ICD-10-CM | POA: Diagnosis not present

## 2018-10-16 DIAGNOSIS — M19071 Primary osteoarthritis, right ankle and foot: Secondary | ICD-10-CM

## 2018-10-16 DIAGNOSIS — H5461 Unqualified visual loss, right eye, normal vision left eye: Secondary | ICD-10-CM | POA: Diagnosis not present

## 2018-10-16 DIAGNOSIS — M51369 Other intervertebral disc degeneration, lumbar region without mention of lumbar back pain or lower extremity pain: Secondary | ICD-10-CM

## 2018-10-16 DIAGNOSIS — M5136 Other intervertebral disc degeneration, lumbar region: Secondary | ICD-10-CM | POA: Diagnosis not present

## 2018-10-16 DIAGNOSIS — Z79899 Other long term (current) drug therapy: Secondary | ICD-10-CM

## 2018-10-16 DIAGNOSIS — M353 Polymyalgia rheumatica: Secondary | ICD-10-CM

## 2018-10-16 DIAGNOSIS — M1611 Unilateral primary osteoarthritis, right hip: Secondary | ICD-10-CM | POA: Diagnosis not present

## 2018-10-16 DIAGNOSIS — Z8659 Personal history of other mental and behavioral disorders: Secondary | ICD-10-CM | POA: Diagnosis not present

## 2018-10-16 DIAGNOSIS — L570 Actinic keratosis: Secondary | ICD-10-CM

## 2018-10-16 DIAGNOSIS — Z8673 Personal history of transient ischemic attack (TIA), and cerebral infarction without residual deficits: Secondary | ICD-10-CM | POA: Diagnosis not present

## 2018-10-16 DIAGNOSIS — M19072 Primary osteoarthritis, left ankle and foot: Secondary | ICD-10-CM

## 2018-10-16 DIAGNOSIS — I639 Cerebral infarction, unspecified: Secondary | ICD-10-CM

## 2018-10-16 DIAGNOSIS — S01511S Laceration without foreign body of lip, sequela: Secondary | ICD-10-CM

## 2018-10-16 NOTE — Progress Notes (Signed)
   Subjective:     Patient ID: Beverly Estrada, female    DOB: 1929-07-25, 85 y.o.   MRN: [id number]  Chief Complaint  Patient presents with  . Follow-up    for lip repair    HPI: The patient is a 85 y.o. yrs old female here for follow up after lip surgery due to contracted lip laceration and excision of a actinic keratosis on 05/21/18.  There is no sign of infection.  There is a firmness.  She has not been massaging the area yet.  Review of Systems  Constitutional: Negative for chills and fever.  Eyes: Negative.   Respiratory: Negative for cough.   Cardiovascular: Negative for chest pain.  Gastrointestinal: Negative.   Genitourinary: Negative.   Musculoskeletal: Negative for myalgias.     Objective:   Vital Signs BP 132/61 (BP Location: Left Arm, Patient Position: Sitting, Cuff Size: Normal)   Pulse 64   Temp 98 F (36.7 C) (Temporal)   Ht 5\' 6"  (1.676 m)   Wt 146 lb 12.8 oz (66.6 kg)   SpO2 99%   BMI 23.69 kg/m  Vital Signs and Nursing Note Reviewed  Physical Exam  Constitutional: She is well-developed, well-nourished, and in no distress.  HENT:  Head: Normocephalic and atraumatic.  Eyes: Pupils are equal, round, and reactive to light. EOM are normal.  Cardiovascular: Normal rate.  Pulmonary/Chest: Effort normal.  Neurological: She is alert.  Skin: Skin is warm.  Psychiatric: Mood, memory, affect and judgment normal.      Assessment/Plan:     ICD-10-CM   1. Actinic keratosis  L57.0   2. Lip laceration, sequela  S01.511S    Recommend massage to the area for the next 3 months.  I do not think there is a foreign body but we could excise the scar again.  The patient says it is doing much better and has not been hurting in the last month. Pictures were obtained of the patient and placed in the chart with the patient's or guardian's permission.

## 2018-10-16 NOTE — Progress Notes (Signed)
Office Visit Note  Patient: Beverly Estrada             Date of Birth: 11-16-1929           MRN: [id number]             PCP: Ernestene Kiel, MD Referring: Ernestene Kiel, MD Visit Date: 10/16/2018 Occupation: @GUAROCC @  Subjective:  Medication monitoring   History of Present Illness: LEISA GAULT is a 85 y.o. female accompanied by her daughter today.  She has known history of rheumatoid arthritis and osteoarthritis.  She states she has been doing well on methotrexate 3 tablets p.o. weekly.  She has some stiffness in her hands and feet.  She denies any joint swelling.  Activities of Daily Living:  Patient reports morning stiffness for 0 minute.   Patient Denies nocturnal pain.  Difficulty dressing/grooming: Denies Difficulty climbing stairs: Denies Difficulty getting out of chair: Reports Difficulty using hands for taps, buttons, cutlery, and/or writing: Denies  Review of Systems  Constitutional: Negative for fatigue, night sweats, weight gain and weight loss.  HENT: Negative for mouth sores, trouble swallowing, trouble swallowing, mouth dryness and nose dryness.   Eyes: Negative for pain, redness, visual disturbance and dryness.  Respiratory: Negative for cough, shortness of breath and difficulty breathing.   Cardiovascular: Negative for chest pain, palpitations, hypertension, irregular heartbeat and swelling in legs/feet.  Gastrointestinal: Negative for blood in stool, constipation and diarrhea.  Endocrine: Negative for increased urination.  Genitourinary: Negative for vaginal dryness.  Musculoskeletal: Positive for arthralgias and joint pain. Negative for joint swelling, myalgias, muscle weakness, morning stiffness, muscle tenderness and myalgias.  Skin: Negative for color change, rash, hair loss, skin tightness, ulcers and sensitivity to sunlight.  Allergic/Immunologic: Negative for susceptible to infections.  Neurological: Negative for dizziness, memory loss,  night sweats and weakness.  Hematological: Negative for swollen glands.  Psychiatric/Behavioral: Negative for depressed mood and sleep disturbance. The patient is not nervous/anxious.     PMFS History:  Patient Active Problem List   Diagnosis Date Noted  . Actinic keratosis 05/29/2018  . Lip laceration 04/28/2018  . Trigger index finger of left hand 09/16/2017  . History of stroke 05/01/2017  . Hyperlipidemia 05/01/2017  . Vision loss of right eye 10/07/2016  . Medication monitoring encounter 07/02/2016  . Trochanteric bursitis of both hips 07/02/2016  . High risk medication use 07/02/2016  . Osteopenia 02/01/2016  . RA (rheumatoid arthritis) ([place]) 01/30/2016  . Osteoarthritis of both knees 01/30/2016  . Osteoarthritis of both feet 01/30/2016  . DDD (degenerative disc disease), lumbar 01/30/2016  . Primary osteoarthritis of right hip 01/30/2016  . Depression 01/30/2016  . Choledocholithiasis 03/13/2015  . Hypothyroidism 03/13/2015  . Polymyalgia rheumatica ([place]) 03/13/2015    Past Medical History:  Diagnosis Date  . Arthritis   . DDD (degenerative disc disease), lumbar 01/30/2016  . Glaucoma   . Hypothyroidism   . Multiple drug allergies    to multiple antibiotics.   . Osteoarthritis of both feet 01/30/2016  . Osteoarthritis of both hands 01/30/2016  . Osteoarthritis of both knees 01/30/2016  . Osteoarthritis of right hand 01/30/2016   moderate  . Polymyalgia rheumatica ([place])   . RA (rheumatoid arthritis) ([place]) 01/30/2016   Sero Negative  . Skin cancer of nose   . Stroke [facility])    right eye     Family History  Problem Relation Age of Onset  . Diabetes Mellitus II Mother   . Stroke Mother   . Diabetes  Mellitus II Brother   . Stroke Brother   . Diabetes Brother   . Diabetes Sister   . Diabetes Sister   . Diabetes Sister   . Diabetes Sister   . Diabetes Brother   . Diabetes Brother   . Diabetes Brother   . Diabetes Brother   . Diabetes Brother    Past  Surgical History:  Procedure Laterality Date  . APPENDECTOMY    . CHOLECYSTECTOMY    . COLONOSCOPY  ~2013   pooyps removed, one of them large per pt. Polyp pathology unclear. no rescreening study planned due to age  . FACIAL LACERATION REPAIR N/A 05/21/2018   Procedure: FACIAL LACERATION REPAIR;  Surgeon: Wallace Going, DO;  Location: [facility];  Service: Plastics;  Laterality: N/A;  . LOOP RECORDER INSERTION N/A 08/23/2016   Procedure: Loop Recorder Insertion;  Surgeon: Thompson Grayer, MD;  Location: [facility];  Service: Cardiovascular;  Laterality: N/A;  . TEE WITHOUT CARDIOVERSION N/A 08/23/2016   Procedure: TRANSESOPHAGEAL ECHOCARDIOGRAM (TEE);  Surgeon: Larey Dresser, MD;  Location: [facility] ENDOSCOPY;  Service: Cardiovascular;  Laterality: N/A;  . TONSIL SURGERY     Social History   Social History Narrative  . Not on file   Immunization History  Administered Date(s) Administered  . Pneumococcal Conjugate-13 09/13/2014, 01/23/2017  . Pneumococcal Polysaccharide-23 04/02/1999  . Tdap 09/18/2009  . Zoster 08/06/2007     Objective: Vital Signs: BP 125/64 (BP Location: Left Arm, Patient Position: Sitting, Cuff Size: Normal)   Pulse 61   Resp 18   Ht 5\' 5"  (1.651 m)   Wt 148 lb 6.4 oz (67.3 kg)   BMI 24.70 kg/m    Physical Exam Vitals signs and nursing note reviewed.  Constitutional:      Appearance: She is well-developed.  HENT:     Head: Normocephalic and atraumatic.  Eyes:     Conjunctiva/sclera: Conjunctivae normal.  Neck:     Musculoskeletal: Normal range of motion.  Cardiovascular:     Rate and Rhythm: Normal rate and regular rhythm.     Heart sounds: Normal heart sounds.  Pulmonary:     Effort: Pulmonary effort is normal.     Breath sounds: Normal breath sounds.  Abdominal:     General: Bowel sounds are normal.     Palpations: Abdomen is soft.  Lymphadenopathy:     Cervical: No cervical adenopathy.  Skin:    General: Skin is  warm and dry.     Capillary Refill: Capillary refill takes less than 2 seconds.  Neurological:     Mental Status: She is alert and oriented to person, place, and time.  Psychiatric:        Behavior: Behavior normal.      Musculoskeletal Exam: C-spine is in good range of motion.  Shoulder joints and elbow joints with good range of motion.  She has some synovial thickening over MCP joints but no synovitis.  She has PIP DIP and CMC thickening consistent with osteoarthritis.  She has limited range of motion of her hip joints without discomfort.  Knee joints ankles were in good range of motion.  She has bilateral hammertoes.  CDAI Exam: CDAI Score: 0.6  Patient Global: 3 mm; Provider Global: 3 mm Swollen: 0 ; Tender: 0  Joint Exam   No joint exam has been documented for this visit   There is currently no information documented on the homunculus. Go to the Rheumatology activity and complete the homunculus joint exam.  Investigation: No additional findings.  Imaging: No results found.  Recent Labs: Lab Results  Component Value Date   WBC 8.6 04/08/2018   HGB 12.1 05/21/2018   PLT 198 04/08/2018   NA 139 05/21/2018   K 3.7 05/21/2018   CL 107 05/21/2018   CO2 24 05/21/2018   GLUCOSE 155 (H) 05/21/2018   BUN 12 05/21/2018   CREATININE 0.63 05/21/2018   BILITOT 0.8 04/08/2018   ALKPHOS 56 10/08/2016   AST 15 04/08/2018   ALT 9 04/08/2018   PROT 6.7 04/08/2018   ALBUMIN 4.2 10/08/2016   CALCIUM 9.6 05/21/2018   GFRAA >60 05/21/2018    Speciality Comments: No specialty comments available.  Procedures:  No procedures performed Allergies: Biaxin [clarithromycin], Augmentin [amoxicillin-pot clavulanate], and Clindamycin/lincomycin   Assessment / Plan:     Visit Diagnoses: Rheumatoid arthritis of multiple sites with negative rheumatoid factor ([place]) -patient had no synovitis on examination.  She has been doing well on methotrexate.  She has some discomfort from underlying  osteoarthritis.  High risk medication use - MTX 3 tablets po once weekly and folic acid 1 mg po daily - Plan: CBC with Differential/Platelet, COMPLETE METABOLIC PANEL WITH GFR, today and then every 3 months.  Her labs are past due.  Precautions during the pandemic which include wearing a mask, good handwashing and social distancing were emphasized.  I have also advised her to discontinue the medication in case she gets an infection and then restart once she is treated.  Primary osteoarthritis of right hip -she has limited range of motion.  Primary osteoarthritis of both knees -she has some discomfort but no swelling.  Primary osteoarthritis of both feet -proper fitting shoes were discussed.  DDD (degenerative disc disease), lumbar -she is chronic pain.  Polymyalgia rheumatica (HCC) -in remission.  Osteopenia of multiple sites -she takes calcium and vitamin D.  Other medical problems are listed as follows:  History of depression   History of stroke   Vision loss of right eye   History of hypothyroidism   Orders: Orders Placed This Encounter  Procedures  . CBC with Differential/Platelet  . COMPLETE METABOLIC PANEL WITH GFR   No orders of the defined types were placed in this encounter.   .  Follow-Up Instructions: Return in about 5 months (around 03/18/2019) for Rheumatoid arthritis, Osteoarthritis.   Bo Merino, MD  Note - This record has been created using Editor, commissioning.  Chart creation errors have been sought, but may not always  have been located. Such creation errors do not reflect on  the standard of medical care.

## 2018-10-16 NOTE — Telephone Encounter (Signed)
Last Visit: 10/16/2018 Next Visit: 03/22/2019 Labs: 10/16/2018  Okay to refill per Dr. Estanislado Pandy.

## 2018-10-16 NOTE — Patient Instructions (Signed)
Standing Labs We placed an order today for your standing lab work.    Please come back and get your standing labs in October and every 3 months   We have open lab daily Monday through Thursday from 8:30-12:30 PM and 1:30-4:30 PM and Friday from 8:30-12:30 PM and 1:30 -4:00 PM at the office of Dr. Rooney Gladwin.   You may experience shorter wait times on Monday and Friday afternoons. The office is located at [address], [address], [place], [place] [address] No appointment is necessary.   Labs are drawn by [facility].  You may receive a bill from [facility] for your lab work.  If you wish to have your labs drawn at another location, please call the office 24 hours in advance to send orders.  If you have any questions regarding directions or hours of operation,  please call [phone].   Just as a reminder please drink plenty of water prior to coming for your lab work. Thanks!   

## 2018-10-17 LAB — COMPLETE METABOLIC PANEL WITH GFR
AG Ratio: 1.4 (calc) (ref 1.0–2.5)
ALT: 12 U/L (ref 6–29)
AST: 20 U/L (ref 10–35)
Albumin: 4.3 g/dL (ref 3.6–5.1)
Alkaline phosphatase (APISO): 52 U/L (ref 37–153)
BUN: 20 mg/dL (ref 7–25)
CO2: 26 mmol/L (ref 20–32)
Calcium: 10.2 mg/dL (ref 8.6–10.4)
Chloride: 109 mmol/L (ref 98–110)
Creat: 0.62 mg/dL (ref 0.60–0.88)
GFR, Est African American: 94 mL/min/{1.73_m2} (ref >=60)
GFR, Est Non African American: 81 mL/min/{1.73_m2} (ref >=60)
Globulin: 3 g/dL (calc) (ref 1.9–3.7)
Glucose, Bld: 126 mg/dL — ABNORMAL HIGH (ref 65–99)
Potassium: 5.3 mmol/L (ref 3.5–5.3)
Sodium: 144 mmol/L (ref 135–146)
Total Bilirubin: 0.9 mg/dL (ref 0.2–1.2)
Total Protein: 7.3 g/dL (ref 6.1–8.1)

## 2018-10-17 LAB — CBC WITH DIFFERENTIAL/PLATELET
Absolute Monocytes: 2285 cells/uL — ABNORMAL HIGH (ref 200–950)
Basophils Absolute: 58 cells/uL (ref 0–200)
Basophils Relative: 0.8 %
Eosinophils Absolute: 22 cells/uL (ref 15–500)
Eosinophils Relative: 0.3 %
HCT: 33.2 % — ABNORMAL LOW (ref 35.0–45.0)
Hemoglobin: 11.1 g/dL — ABNORMAL LOW (ref 11.7–15.5)
Lymphs Abs: 1613 cells/uL (ref 850–3900)
MCH: 31.4 pg (ref 27.0–33.0)
MCHC: 33.4 g/dL (ref 32.0–36.0)
MCV: 94.1 fL (ref 80.0–100.0)
MPV: 11 fL (ref 7.5–12.5)
Monocytes Relative: 31.3 %
Neutro Abs: 3322 cells/uL (ref 1500–7800)
Neutrophils Relative %: 45.5 %
Platelets: 207 10*3/uL (ref 140–400)
RBC: 3.53 10*6/uL — ABNORMAL LOW (ref 3.80–5.10)
RDW: 14.5 % (ref 11.0–15.0)
Total Lymphocyte: 22.1 %
WBC: 7.3 10*3/uL (ref 3.8–10.8)

## 2018-10-19 NOTE — Progress Notes (Signed)
Anemia is a stable.  Glucose is elevated.  Please forward labs to her PCP.

## 2018-10-21 ENCOUNTER — Ambulatory Visit: Payer: Medicare Other | Admitting: Physician Assistant

## 2018-10-28 ENCOUNTER — Ambulatory Visit: Payer: Medicare Other | Admitting: Rheumatology

## 2018-10-29 DIAGNOSIS — D729 Disorder of white blood cells, unspecified: Secondary | ICD-10-CM | POA: Diagnosis not present

## 2018-11-01 LAB — CUP PACEART REMOTE DEVICE CHECK
Date Time Interrogation Session: 20200802113602
Implantable Pulse Generator Implant Date: 20180525

## 2018-11-02 ENCOUNTER — Ambulatory Visit (INDEPENDENT_AMBULATORY_CARE_PROVIDER_SITE_OTHER): Payer: Medicare Other | Admitting: *Deleted

## 2018-11-02 DIAGNOSIS — I639 Cerebral infarction, unspecified: Secondary | ICD-10-CM

## 2018-11-04 DIAGNOSIS — H348112 Central retinal vein occlusion, right eye, stable: Secondary | ICD-10-CM | POA: Diagnosis not present

## 2018-11-04 DIAGNOSIS — H40001 Preglaucoma, unspecified, right eye: Secondary | ICD-10-CM | POA: Diagnosis not present

## 2018-11-11 NOTE — Progress Notes (Signed)
Carelink Summary Report / Loop Recorder 

## 2018-12-04 ENCOUNTER — Ambulatory Visit (INDEPENDENT_AMBULATORY_CARE_PROVIDER_SITE_OTHER): Payer: Medicare Other | Admitting: *Deleted

## 2018-12-04 DIAGNOSIS — I639 Cerebral infarction, unspecified: Secondary | ICD-10-CM | POA: Diagnosis not present

## 2018-12-04 LAB — CUP PACEART REMOTE DEVICE CHECK
Date Time Interrogation Session: 20200904120817
Implantable Pulse Generator Implant Date: 20180525

## 2018-12-14 DIAGNOSIS — E538 Deficiency of other specified B group vitamins: Secondary | ICD-10-CM | POA: Diagnosis not present

## 2018-12-14 DIAGNOSIS — D649 Anemia, unspecified: Secondary | ICD-10-CM | POA: Diagnosis not present

## 2018-12-16 NOTE — Progress Notes (Signed)
Carelink Summary Report / Loop Recorder 

## 2018-12-21 DIAGNOSIS — D649 Anemia, unspecified: Secondary | ICD-10-CM | POA: Diagnosis not present

## 2018-12-31 ENCOUNTER — Other Ambulatory Visit: Payer: Self-pay | Admitting: Rheumatology

## 2018-12-31 DIAGNOSIS — Z23 Encounter for immunization: Secondary | ICD-10-CM | POA: Diagnosis not present

## 2018-12-31 NOTE — Telephone Encounter (Signed)
Last visit: 10/16/18 Next Visit: 03/22/19 Labs: 10/16/18 Anemia is a stable. Glucose is elevated.   Reminded patient's daughter patient is due for labs this month.   Okay to refill per Dr. Estanislado Pandy

## 2019-01-01 ENCOUNTER — Telehealth: Payer: Self-pay | Admitting: Rheumatology

## 2019-01-01 ENCOUNTER — Other Ambulatory Visit: Payer: Self-pay

## 2019-01-01 DIAGNOSIS — H9190 Unspecified hearing loss, unspecified ear: Secondary | ICD-10-CM | POA: Diagnosis not present

## 2019-01-01 DIAGNOSIS — H6120 Impacted cerumen, unspecified ear: Secondary | ICD-10-CM | POA: Diagnosis not present

## 2019-01-01 DIAGNOSIS — H61303 Acquired stenosis of external ear canal, unspecified, bilateral: Secondary | ICD-10-CM | POA: Diagnosis not present

## 2019-01-01 DIAGNOSIS — Z79899 Other long term (current) drug therapy: Secondary | ICD-10-CM

## 2019-01-01 NOTE — Telephone Encounter (Signed)
Patient at [facility] now for labs. Please release orders.

## 2019-01-01 NOTE — Telephone Encounter (Signed)
Lab orders were released at 2:40pm.

## 2019-01-02 LAB — CBC WITH DIFFERENTIAL/PLATELET
Basophils Absolute: 0.1 10*3/uL (ref 0.0–0.2)
Basos: 1 %
EOS (ABSOLUTE): 0 10*3/uL (ref 0.0–0.4)
Eos: 0 %
Hematocrit: 35.5 % (ref 34.0–46.6)
Hemoglobin: 11.9 g/dL (ref 11.1–15.9)
Lymphocytes Absolute: 1.7 10*3/uL (ref 0.7–3.1)
Lymphs: 17 %
MCH: 31.1 pg (ref 26.6–33.0)
MCHC: 33.5 g/dL (ref 31.5–35.7)
MCV: 93 fL (ref 79–97)
Monocytes Absolute: 3.8 10*3/uL — ABNORMAL HIGH (ref 0.1–0.9)
Monocytes: 38 %
Neutrophils Absolute: 4.4 10*3/uL (ref 1.4–7.0)
Neutrophils: 43 %
Platelets: 203 10*3/uL (ref 150–450)
RBC: 3.83 x10E6/uL (ref 3.77–5.28)
RDW: 15.9 % — ABNORMAL HIGH (ref 11.7–15.4)
WBC: 10.1 10*3/uL (ref 3.4–10.8)

## 2019-01-02 LAB — CMP14+EGFR
ALT: 14 IU/L (ref 0–32)
AST: 22 IU/L (ref 0–40)
Albumin/Globulin Ratio: 1.8 (ref 1.2–2.2)
Albumin: 4.6 g/dL (ref 3.6–4.6)
Alkaline Phosphatase: 61 IU/L (ref 39–117)
BUN/Creatinine Ratio: 27 (ref 12–28)
BUN: 21 mg/dL (ref 8–27)
Bilirubin Total: 0.8 mg/dL (ref 0.0–1.2)
CO2: 23 mmol/L (ref 20–29)
Calcium: 9.9 mg/dL (ref 8.7–10.3)
Chloride: 104 mmol/L (ref 96–106)
Creatinine, Ser: 0.77 mg/dL (ref 0.57–1.00)
GFR calc Af Amer: 80 mL/min/{1.73_m2} (ref >59)
GFR calc non Af Amer: 70 mL/min/{1.73_m2} (ref >59)
Globulin, Total: 2.5 g/dL (ref 1.5–4.5)
Glucose: 129 mg/dL — ABNORMAL HIGH (ref 65–99)
Potassium: 4.4 mmol/L (ref 3.5–5.2)
Sodium: 139 mmol/L (ref 134–144)
Total Protein: 7.1 g/dL (ref 6.0–8.5)

## 2019-01-02 LAB — IMMATURE CELLS: Bands(Auto) Relative: 1 %

## 2019-01-04 NOTE — Progress Notes (Signed)
Please contact neurologist to see if there is any significance of 1% bands in her blood and elevated monocyte.  Should we refer her to hematology for evaluation?

## 2019-01-06 ENCOUNTER — Ambulatory Visit (INDEPENDENT_AMBULATORY_CARE_PROVIDER_SITE_OTHER): Payer: Medicare Other | Admitting: *Deleted

## 2019-01-06 DIAGNOSIS — Z8673 Personal history of transient ischemic attack (TIA), and cerebral infarction without residual deficits: Secondary | ICD-10-CM

## 2019-01-07 LAB — CUP PACEART REMOTE DEVICE CHECK
Date Time Interrogation Session: 20201007120957
Implantable Pulse Generator Implant Date: 20180525

## 2019-01-15 ENCOUNTER — Ambulatory Visit: Payer: Medicare Other | Admitting: Surgical

## 2019-01-15 NOTE — Progress Notes (Signed)
Carelink Summary Report / Loop Recorder 

## 2019-01-26 DIAGNOSIS — L720 Epidermal cyst: Secondary | ICD-10-CM | POA: Diagnosis not present

## 2019-02-08 ENCOUNTER — Ambulatory Visit (INDEPENDENT_AMBULATORY_CARE_PROVIDER_SITE_OTHER): Payer: Medicare Other | Admitting: *Deleted

## 2019-02-08 DIAGNOSIS — Z8673 Personal history of transient ischemic attack (TIA), and cerebral infarction without residual deficits: Secondary | ICD-10-CM

## 2019-02-09 LAB — CUP PACEART REMOTE DEVICE CHECK
Date Time Interrogation Session: 20201109152234
Implantable Pulse Generator Implant Date: 20180525

## 2019-02-24 ENCOUNTER — Other Ambulatory Visit: Payer: Self-pay

## 2019-03-04 DIAGNOSIS — H34811 Central retinal vein occlusion, right eye, with macular edema: Secondary | ICD-10-CM | POA: Diagnosis not present

## 2019-03-08 NOTE — Progress Notes (Signed)
Carelink Summary Report / Loop Recorder 

## 2019-03-12 ENCOUNTER — Ambulatory Visit (INDEPENDENT_AMBULATORY_CARE_PROVIDER_SITE_OTHER): Payer: Medicare Other | Admitting: *Deleted

## 2019-03-12 DIAGNOSIS — Z8673 Personal history of transient ischemic attack (TIA), and cerebral infarction without residual deficits: Secondary | ICD-10-CM | POA: Diagnosis not present

## 2019-03-14 LAB — CUP PACEART REMOTE DEVICE CHECK
Date Time Interrogation Session: 20201212101929
Implantable Pulse Generator Implant Date: 20180525

## 2019-03-18 ENCOUNTER — Other Ambulatory Visit: Payer: Self-pay | Admitting: Rheumatology

## 2019-03-18 NOTE — Progress Notes (Signed)
Virtual Visit via Telephone Note  I connected with Beverly Estrada on 03/22/19 at 11:30 AM EST by telephone and verified that I am speaking with the correct person using two identifiers.  Location: Patient: Home  Provider: Clinic  This service was conducted via virtual visit. The patient was located at home. I was located in my office.  Consent was obtained prior to the virtual visit and is aware of possible charges through their insurance for this visit.  The patient is an established patient.  Dr. Estanislado Pandy, MD conducted the virtual visit and Hazel Sams, PA-C acted as scribe during the service.  Office staff helped with scheduling follow up visits after the service was conducted.   I discussed the limitations, risks, security and privacy concerns of performing an evaluation and management service by telephone and the availability of in person appointments. I also discussed with the patient that there may be a patient responsible charge related to this service. The patient expressed understanding and agreed to proceed.  CC: Medication monitoring  History of Present Illness: Patient is a 85 year old female with past medical history of seronegative rheumatoid arthritis, PMR, and osteoarthritis.  She is taking MTX 3 tablets po once weekly and folic acid 1 mg po daily. She denies any recent RA flares. She has pain in both hands but no inflammation.  She denies any feet pain.  She denies any PMR flares.  She is not having any difficulty getting up from a seated position or raising her arms above her head. She states she is afraid of falling due to loss of balance and muscle deconditioning so she would like to go to PT.   Review of Systems  Constitutional: Positive for malaise/fatigue. Negative for fever.  HENT: Negative for congestion.   Eyes: Negative for photophobia, pain, discharge and redness.  Respiratory: Negative for cough, shortness of breath and wheezing.   Cardiovascular: Negative  for chest pain, palpitations and leg swelling.  Gastrointestinal: Positive for constipation. Negative for blood in stool and diarrhea.  Genitourinary: Negative for dysuria.  Musculoskeletal: Positive for joint pain. Negative for back pain, myalgias and neck pain.       +Muscle deconditioning   Skin: Negative for rash.  Neurological: Negative for dizziness, weakness and headaches.  Endo/Heme/Allergies: Does not bruise/bleed easily.  Psychiatric/Behavioral: Positive for memory loss. Negative for depression. The patient is not nervous/anxious and does not have insomnia.     Observations/Objective: Physical Exam  Constitutional: She is oriented to person, place, and time.  Neurological: She is alert and oriented to person, place, and time.  Psychiatric: Mood, memory, affect and judgment normal.   Patient reports morning stiffness for 0 none.   Patient denies nocturnal pain.  Difficulty dressing/grooming: Denies Difficulty climbing stairs: Reports Difficulty getting out of chair: Reports Difficulty using hands for taps, buttons, cutlery, and/or writing: Denies   Assessment and Plan: Visit Diagnoses: Rheumatoid arthritis of multiple sites with negative rheumatoid factor ([place]) -She has not had any signs or symptoms of a flare.  She is clinically doing well on MTX 3 tablets by mouth once weekly and folic acid 1 mg po daily.  She has occasional discomfort in both hands but denies any joint swelling currently.  She is not having any other joint pain or inflammation at this time.  She will continue taking MTX as prescribed.  She was advised to notify us if she develops increased joint pain or joint swelling.  She will follow up in 4-5 months.  High risk medication use - MTX 3 tablets po once weekly and folic acid 1 mg po daily.  CBC and CMP were drawn on 01/01/19.  She will require lab work in January and every 3 months.   Primary osteoarthritis of right hip -She has no discomfort at this  time.  Primary osteoarthritis of both knees -She denies any knee joint pain or inflammation currently. She has noticed some lower extremity deconditioning.  A referral will be placed for lower extremity muscle strengthening and fall prevention. She has been using a walker to assist with ambulation.   Severe muscle deconditioning: She has been experiencing increased lower extremity muscle weakness due to deconditioning.  She has been using a walker to assist with ambulation but has been concerned about falling.  A referral to PT will be placed today.  Risks for falls: We will place a referral to PT today.  Primary osteoarthritis of both feet -She has no feet pain or joint swelling currently.   DDD (degenerative disc disease), lumbar -She has no lower back pain or stiffness at this time.  Polymyalgia rheumatica ([facility]) -in remission. She has not had any signs or symptoms of a flare.  She has no increased myalgias or muscle weakness at this time.  She was advised to notify us if she develops any signs or symptoms of a flare.   Osteopenia of multiple sites -She takes calcium and vitamin D supplements.   Other medical problems are listed as follows:  History of depression   History of stroke   Vision loss of right eye   History of hypothyroidism   Follow Up Instructions: She will follow up in    I discussed the assessment and treatment plan with the patient. The patient was provided an opportunity to ask questions and all were answered. The patient agreed with the plan and demonstrated an understanding of the instructions.   The patient was advised to call back or seek an in-person evaluation if the symptoms worsen or if the condition fails to improve as anticipated.  I provided 15 minutes of non-face-to-face time during this encounter.   Bo Merino, MD    Scribed by-  Hazel Sams, PA-C

## 2019-03-18 NOTE — Telephone Encounter (Signed)
Last Visit: 10/16/2018 Next Visit: 03/22/2019 Labs: 01/01/2019 RDW 15.9, monocytes absolute 3.8, glucose 129.  Okay to refill per Dr. Estanislado Pandy.

## 2019-03-22 ENCOUNTER — Other Ambulatory Visit: Payer: Self-pay | Admitting: *Deleted

## 2019-03-22 ENCOUNTER — Encounter: Payer: Self-pay | Admitting: Rheumatology

## 2019-03-22 ENCOUNTER — Other Ambulatory Visit: Payer: Self-pay

## 2019-03-22 ENCOUNTER — Telehealth (INDEPENDENT_AMBULATORY_CARE_PROVIDER_SITE_OTHER): Payer: Medicare Other | Admitting: Rheumatology

## 2019-03-22 DIAGNOSIS — I639 Cerebral infarction, unspecified: Secondary | ICD-10-CM

## 2019-03-22 DIAGNOSIS — M7061 Trochanteric bursitis, right hip: Secondary | ICD-10-CM

## 2019-03-22 DIAGNOSIS — M8589 Other specified disorders of bone density and structure, multiple sites: Secondary | ICD-10-CM

## 2019-03-22 DIAGNOSIS — R29898 Other symptoms and signs involving the musculoskeletal system: Secondary | ICD-10-CM

## 2019-03-22 DIAGNOSIS — H5461 Unqualified visual loss, right eye, normal vision left eye: Secondary | ICD-10-CM

## 2019-03-22 DIAGNOSIS — M1611 Unilateral primary osteoarthritis, right hip: Secondary | ICD-10-CM | POA: Diagnosis not present

## 2019-03-22 DIAGNOSIS — Z79899 Other long term (current) drug therapy: Secondary | ICD-10-CM

## 2019-03-22 DIAGNOSIS — Z8639 Personal history of other endocrine, nutritional and metabolic disease: Secondary | ICD-10-CM

## 2019-03-22 DIAGNOSIS — M0609 Rheumatoid arthritis without rheumatoid factor, multiple sites: Secondary | ICD-10-CM

## 2019-03-22 DIAGNOSIS — M5136 Other intervertebral disc degeneration, lumbar region: Secondary | ICD-10-CM

## 2019-03-22 DIAGNOSIS — M19071 Primary osteoarthritis, right ankle and foot: Secondary | ICD-10-CM

## 2019-03-22 DIAGNOSIS — Z8659 Personal history of other mental and behavioral disorders: Secondary | ICD-10-CM

## 2019-03-22 DIAGNOSIS — Y93B1 Activity, exercise machines primarily for muscle strengthening: Secondary | ICD-10-CM

## 2019-03-22 DIAGNOSIS — M353 Polymyalgia rheumatica: Secondary | ICD-10-CM

## 2019-03-22 DIAGNOSIS — Z8673 Personal history of transient ischemic attack (TIA), and cerebral infarction without residual deficits: Secondary | ICD-10-CM

## 2019-03-22 DIAGNOSIS — M170 Bilateral primary osteoarthritis of knee: Secondary | ICD-10-CM

## 2019-03-22 DIAGNOSIS — Z9181 History of falling: Secondary | ICD-10-CM

## 2019-03-22 DIAGNOSIS — M19072 Primary osteoarthritis, left ankle and foot: Secondary | ICD-10-CM

## 2019-03-22 DIAGNOSIS — M7062 Trochanteric bursitis, left hip: Secondary | ICD-10-CM

## 2019-03-22 DIAGNOSIS — M65322 Trigger finger, left index finger: Secondary | ICD-10-CM

## 2019-03-29 ENCOUNTER — Other Ambulatory Visit: Payer: Self-pay | Admitting: Rheumatology

## 2019-04-15 ENCOUNTER — Ambulatory Visit (INDEPENDENT_AMBULATORY_CARE_PROVIDER_SITE_OTHER): Payer: Medicare Other | Admitting: *Deleted

## 2019-04-15 DIAGNOSIS — Z8673 Personal history of transient ischemic attack (TIA), and cerebral infarction without residual deficits: Secondary | ICD-10-CM | POA: Diagnosis not present

## 2019-04-15 LAB — CUP PACEART REMOTE DEVICE CHECK
Date Time Interrogation Session: 20210114110541
Implantable Pulse Generator Implant Date: 20180525

## 2019-04-16 NOTE — Progress Notes (Signed)
ILR remote 

## 2019-04-21 DIAGNOSIS — M6281 Muscle weakness (generalized): Secondary | ICD-10-CM | POA: Diagnosis not present

## 2019-04-21 DIAGNOSIS — R269 Unspecified abnormalities of gait and mobility: Secondary | ICD-10-CM | POA: Diagnosis not present

## 2019-04-28 DIAGNOSIS — R269 Unspecified abnormalities of gait and mobility: Secondary | ICD-10-CM | POA: Diagnosis not present

## 2019-04-28 DIAGNOSIS — M6281 Muscle weakness (generalized): Secondary | ICD-10-CM | POA: Diagnosis not present

## 2019-05-05 DIAGNOSIS — R269 Unspecified abnormalities of gait and mobility: Secondary | ICD-10-CM | POA: Diagnosis not present

## 2019-05-05 DIAGNOSIS — M6281 Muscle weakness (generalized): Secondary | ICD-10-CM | POA: Diagnosis not present

## 2019-05-06 DIAGNOSIS — H40003 Preglaucoma, unspecified, bilateral: Secondary | ICD-10-CM | POA: Diagnosis not present

## 2019-05-06 DIAGNOSIS — H35372 Puckering of macula, left eye: Secondary | ICD-10-CM | POA: Diagnosis not present

## 2019-05-06 DIAGNOSIS — H34811 Central retinal vein occlusion, right eye, with macular edema: Secondary | ICD-10-CM | POA: Diagnosis not present

## 2019-05-06 DIAGNOSIS — Z961 Presence of intraocular lens: Secondary | ICD-10-CM | POA: Diagnosis not present

## 2019-05-12 DIAGNOSIS — R269 Unspecified abnormalities of gait and mobility: Secondary | ICD-10-CM | POA: Diagnosis not present

## 2019-05-12 DIAGNOSIS — M6281 Muscle weakness (generalized): Secondary | ICD-10-CM | POA: Diagnosis not present

## 2019-05-17 ENCOUNTER — Ambulatory Visit (INDEPENDENT_AMBULATORY_CARE_PROVIDER_SITE_OTHER): Payer: Medicare Other | Admitting: *Deleted

## 2019-05-17 DIAGNOSIS — Z8673 Personal history of transient ischemic attack (TIA), and cerebral infarction without residual deficits: Secondary | ICD-10-CM

## 2019-05-17 LAB — CUP PACEART REMOTE DEVICE CHECK
Date Time Interrogation Session: 20210214232033
Implantable Pulse Generator Implant Date: 20180525

## 2019-05-18 NOTE — Progress Notes (Signed)
ILR Remote 

## 2019-05-19 DIAGNOSIS — M6281 Muscle weakness (generalized): Secondary | ICD-10-CM | POA: Diagnosis not present

## 2019-05-19 DIAGNOSIS — R269 Unspecified abnormalities of gait and mobility: Secondary | ICD-10-CM | POA: Diagnosis not present

## 2019-05-26 DIAGNOSIS — R269 Unspecified abnormalities of gait and mobility: Secondary | ICD-10-CM | POA: Diagnosis not present

## 2019-05-26 DIAGNOSIS — M6281 Muscle weakness (generalized): Secondary | ICD-10-CM | POA: Diagnosis not present

## 2019-06-11 DIAGNOSIS — H35372 Puckering of macula, left eye: Secondary | ICD-10-CM | POA: Diagnosis not present

## 2019-06-11 DIAGNOSIS — H34811 Central retinal vein occlusion, right eye, with macular edema: Secondary | ICD-10-CM | POA: Diagnosis not present

## 2019-06-11 DIAGNOSIS — H33322 Round hole, left eye: Secondary | ICD-10-CM | POA: Diagnosis not present

## 2019-06-11 DIAGNOSIS — Z961 Presence of intraocular lens: Secondary | ICD-10-CM | POA: Diagnosis not present

## 2019-06-14 ENCOUNTER — Telehealth: Payer: Self-pay

## 2019-06-14 NOTE — Telephone Encounter (Signed)
Spoke with patient regarding disconnected monitor 

## 2019-06-15 DIAGNOSIS — H903 Sensorineural hearing loss, bilateral: Secondary | ICD-10-CM | POA: Diagnosis not present

## 2019-06-16 DIAGNOSIS — R269 Unspecified abnormalities of gait and mobility: Secondary | ICD-10-CM | POA: Diagnosis not present

## 2019-06-16 DIAGNOSIS — G3184 Mild cognitive impairment, so stated: Secondary | ICD-10-CM | POA: Diagnosis not present

## 2019-06-16 DIAGNOSIS — R4589 Other symptoms and signs involving emotional state: Secondary | ICD-10-CM | POA: Diagnosis not present

## 2019-06-16 DIAGNOSIS — M069 Rheumatoid arthritis, unspecified: Secondary | ICD-10-CM | POA: Diagnosis not present

## 2019-06-16 DIAGNOSIS — Z8673 Personal history of transient ischemic attack (TIA), and cerebral infarction without residual deficits: Secondary | ICD-10-CM | POA: Diagnosis not present

## 2019-06-16 DIAGNOSIS — M810 Age-related osteoporosis without current pathological fracture: Secondary | ICD-10-CM | POA: Diagnosis not present

## 2019-06-16 DIAGNOSIS — D649 Anemia, unspecified: Secondary | ICD-10-CM | POA: Diagnosis not present

## 2019-06-16 DIAGNOSIS — E785 Hyperlipidemia, unspecified: Secondary | ICD-10-CM | POA: Diagnosis not present

## 2019-06-16 DIAGNOSIS — E039 Hypothyroidism, unspecified: Secondary | ICD-10-CM | POA: Diagnosis not present

## 2019-06-17 ENCOUNTER — Ambulatory Visit (INDEPENDENT_AMBULATORY_CARE_PROVIDER_SITE_OTHER): Payer: Medicare Other | Admitting: *Deleted

## 2019-06-17 DIAGNOSIS — Z8673 Personal history of transient ischemic attack (TIA), and cerebral infarction without residual deficits: Secondary | ICD-10-CM

## 2019-06-17 LAB — CUP PACEART REMOTE DEVICE CHECK
Date Time Interrogation Session: 20210318012150
Implantable Pulse Generator Implant Date: 20180525

## 2019-06-17 NOTE — Progress Notes (Signed)
ILR Remote 

## 2019-06-22 DIAGNOSIS — R2681 Unsteadiness on feet: Secondary | ICD-10-CM | POA: Diagnosis not present

## 2019-06-22 DIAGNOSIS — R2689 Other abnormalities of gait and mobility: Secondary | ICD-10-CM | POA: Diagnosis not present

## 2019-06-22 DIAGNOSIS — M6281 Muscle weakness (generalized): Secondary | ICD-10-CM | POA: Diagnosis not present

## 2019-06-24 DIAGNOSIS — R2681 Unsteadiness on feet: Secondary | ICD-10-CM | POA: Diagnosis not present

## 2019-06-24 DIAGNOSIS — R2689 Other abnormalities of gait and mobility: Secondary | ICD-10-CM | POA: Diagnosis not present

## 2019-06-24 DIAGNOSIS — M6281 Muscle weakness (generalized): Secondary | ICD-10-CM | POA: Diagnosis not present

## 2019-06-28 DIAGNOSIS — R2689 Other abnormalities of gait and mobility: Secondary | ICD-10-CM | POA: Diagnosis not present

## 2019-06-28 DIAGNOSIS — M6281 Muscle weakness (generalized): Secondary | ICD-10-CM | POA: Diagnosis not present

## 2019-06-28 DIAGNOSIS — R2681 Unsteadiness on feet: Secondary | ICD-10-CM | POA: Diagnosis not present

## 2019-07-01 DIAGNOSIS — R2689 Other abnormalities of gait and mobility: Secondary | ICD-10-CM | POA: Diagnosis not present

## 2019-07-01 DIAGNOSIS — G3184 Mild cognitive impairment, so stated: Secondary | ICD-10-CM | POA: Diagnosis not present

## 2019-07-01 DIAGNOSIS — R2681 Unsteadiness on feet: Secondary | ICD-10-CM | POA: Diagnosis not present

## 2019-07-01 DIAGNOSIS — M6281 Muscle weakness (generalized): Secondary | ICD-10-CM | POA: Diagnosis not present

## 2019-07-01 DIAGNOSIS — R41841 Cognitive communication deficit: Secondary | ICD-10-CM | POA: Diagnosis not present

## 2019-07-05 DIAGNOSIS — G3184 Mild cognitive impairment, so stated: Secondary | ICD-10-CM | POA: Diagnosis not present

## 2019-07-05 DIAGNOSIS — R41841 Cognitive communication deficit: Secondary | ICD-10-CM | POA: Diagnosis not present

## 2019-07-05 DIAGNOSIS — M6281 Muscle weakness (generalized): Secondary | ICD-10-CM | POA: Diagnosis not present

## 2019-07-05 DIAGNOSIS — R2681 Unsteadiness on feet: Secondary | ICD-10-CM | POA: Diagnosis not present

## 2019-07-05 DIAGNOSIS — R2689 Other abnormalities of gait and mobility: Secondary | ICD-10-CM | POA: Diagnosis not present

## 2019-07-06 DIAGNOSIS — R41841 Cognitive communication deficit: Secondary | ICD-10-CM | POA: Diagnosis not present

## 2019-07-06 DIAGNOSIS — G3184 Mild cognitive impairment, so stated: Secondary | ICD-10-CM | POA: Diagnosis not present

## 2019-07-06 DIAGNOSIS — D649 Anemia, unspecified: Secondary | ICD-10-CM | POA: Diagnosis not present

## 2019-07-06 DIAGNOSIS — M810 Age-related osteoporosis without current pathological fracture: Secondary | ICD-10-CM | POA: Diagnosis not present

## 2019-07-06 DIAGNOSIS — M6281 Muscle weakness (generalized): Secondary | ICD-10-CM | POA: Diagnosis not present

## 2019-07-06 DIAGNOSIS — E785 Hyperlipidemia, unspecified: Secondary | ICD-10-CM | POA: Diagnosis not present

## 2019-07-06 DIAGNOSIS — E039 Hypothyroidism, unspecified: Secondary | ICD-10-CM | POA: Diagnosis not present

## 2019-07-06 DIAGNOSIS — Z8673 Personal history of transient ischemic attack (TIA), and cerebral infarction without residual deficits: Secondary | ICD-10-CM | POA: Diagnosis not present

## 2019-07-06 DIAGNOSIS — R2689 Other abnormalities of gait and mobility: Secondary | ICD-10-CM | POA: Diagnosis not present

## 2019-07-06 DIAGNOSIS — R2681 Unsteadiness on feet: Secondary | ICD-10-CM | POA: Diagnosis not present

## 2019-07-08 DIAGNOSIS — R41841 Cognitive communication deficit: Secondary | ICD-10-CM | POA: Diagnosis not present

## 2019-07-08 DIAGNOSIS — G3184 Mild cognitive impairment, so stated: Secondary | ICD-10-CM | POA: Diagnosis not present

## 2019-07-08 DIAGNOSIS — R2689 Other abnormalities of gait and mobility: Secondary | ICD-10-CM | POA: Diagnosis not present

## 2019-07-08 DIAGNOSIS — R2681 Unsteadiness on feet: Secondary | ICD-10-CM | POA: Diagnosis not present

## 2019-07-08 DIAGNOSIS — M6281 Muscle weakness (generalized): Secondary | ICD-10-CM | POA: Diagnosis not present

## 2019-07-12 DIAGNOSIS — R41841 Cognitive communication deficit: Secondary | ICD-10-CM | POA: Diagnosis not present

## 2019-07-12 DIAGNOSIS — M6281 Muscle weakness (generalized): Secondary | ICD-10-CM | POA: Diagnosis not present

## 2019-07-12 DIAGNOSIS — R2689 Other abnormalities of gait and mobility: Secondary | ICD-10-CM | POA: Diagnosis not present

## 2019-07-12 DIAGNOSIS — R2681 Unsteadiness on feet: Secondary | ICD-10-CM | POA: Diagnosis not present

## 2019-07-12 DIAGNOSIS — G3184 Mild cognitive impairment, so stated: Secondary | ICD-10-CM | POA: Diagnosis not present

## 2019-07-13 DIAGNOSIS — S81802D Unspecified open wound, left lower leg, subsequent encounter: Secondary | ICD-10-CM | POA: Diagnosis not present

## 2019-07-13 DIAGNOSIS — L03116 Cellulitis of left lower limb: Secondary | ICD-10-CM | POA: Diagnosis not present

## 2019-07-13 DIAGNOSIS — Z1389 Encounter for screening for other disorder: Secondary | ICD-10-CM | POA: Diagnosis not present

## 2019-07-15 DIAGNOSIS — S81802D Unspecified open wound, left lower leg, subsequent encounter: Secondary | ICD-10-CM | POA: Diagnosis not present

## 2019-07-15 DIAGNOSIS — L03116 Cellulitis of left lower limb: Secondary | ICD-10-CM | POA: Diagnosis not present

## 2019-07-15 DIAGNOSIS — R41841 Cognitive communication deficit: Secondary | ICD-10-CM | POA: Diagnosis not present

## 2019-07-15 DIAGNOSIS — R2681 Unsteadiness on feet: Secondary | ICD-10-CM | POA: Diagnosis not present

## 2019-07-15 DIAGNOSIS — G3184 Mild cognitive impairment, so stated: Secondary | ICD-10-CM | POA: Diagnosis not present

## 2019-07-15 DIAGNOSIS — R2689 Other abnormalities of gait and mobility: Secondary | ICD-10-CM | POA: Diagnosis not present

## 2019-07-15 DIAGNOSIS — M6281 Muscle weakness (generalized): Secondary | ICD-10-CM | POA: Diagnosis not present

## 2019-07-19 DIAGNOSIS — R41841 Cognitive communication deficit: Secondary | ICD-10-CM | POA: Diagnosis not present

## 2019-07-19 DIAGNOSIS — R2681 Unsteadiness on feet: Secondary | ICD-10-CM | POA: Diagnosis not present

## 2019-07-19 DIAGNOSIS — M6281 Muscle weakness (generalized): Secondary | ICD-10-CM | POA: Diagnosis not present

## 2019-07-19 DIAGNOSIS — R2689 Other abnormalities of gait and mobility: Secondary | ICD-10-CM | POA: Diagnosis not present

## 2019-07-19 DIAGNOSIS — G3184 Mild cognitive impairment, so stated: Secondary | ICD-10-CM | POA: Diagnosis not present

## 2019-07-20 DIAGNOSIS — L03116 Cellulitis of left lower limb: Secondary | ICD-10-CM | POA: Diagnosis not present

## 2019-07-20 DIAGNOSIS — R739 Hyperglycemia, unspecified: Secondary | ICD-10-CM | POA: Diagnosis not present

## 2019-07-20 DIAGNOSIS — E785 Hyperlipidemia, unspecified: Secondary | ICD-10-CM | POA: Diagnosis not present

## 2019-07-20 DIAGNOSIS — M069 Rheumatoid arthritis, unspecified: Secondary | ICD-10-CM | POA: Diagnosis not present

## 2019-07-20 DIAGNOSIS — Z1389 Encounter for screening for other disorder: Secondary | ICD-10-CM | POA: Diagnosis not present

## 2019-07-20 DIAGNOSIS — E039 Hypothyroidism, unspecified: Secondary | ICD-10-CM | POA: Diagnosis not present

## 2019-07-22 DIAGNOSIS — R2681 Unsteadiness on feet: Secondary | ICD-10-CM | POA: Diagnosis not present

## 2019-07-22 DIAGNOSIS — R2689 Other abnormalities of gait and mobility: Secondary | ICD-10-CM | POA: Diagnosis not present

## 2019-07-22 DIAGNOSIS — M6281 Muscle weakness (generalized): Secondary | ICD-10-CM | POA: Diagnosis not present

## 2019-07-22 DIAGNOSIS — R41841 Cognitive communication deficit: Secondary | ICD-10-CM | POA: Diagnosis not present

## 2019-07-22 DIAGNOSIS — G3184 Mild cognitive impairment, so stated: Secondary | ICD-10-CM | POA: Diagnosis not present

## 2019-07-28 DIAGNOSIS — M545 Low back pain: Secondary | ICD-10-CM | POA: Diagnosis not present

## 2019-08-03 DIAGNOSIS — Z961 Presence of intraocular lens: Secondary | ICD-10-CM | POA: Diagnosis not present

## 2019-08-03 DIAGNOSIS — H35372 Puckering of macula, left eye: Secondary | ICD-10-CM | POA: Diagnosis not present

## 2019-08-03 DIAGNOSIS — H33322 Round hole, left eye: Secondary | ICD-10-CM | POA: Diagnosis not present

## 2019-08-03 DIAGNOSIS — H40013 Open angle with borderline findings, low risk, bilateral: Secondary | ICD-10-CM | POA: Diagnosis not present

## 2019-08-03 DIAGNOSIS — H34813 Central retinal vein occlusion, bilateral, with macular edema: Secondary | ICD-10-CM | POA: Diagnosis not present

## 2019-08-10 ENCOUNTER — Other Ambulatory Visit: Payer: Self-pay | Admitting: Internal Medicine

## 2019-08-10 DIAGNOSIS — R2689 Other abnormalities of gait and mobility: Secondary | ICD-10-CM | POA: Diagnosis not present

## 2019-08-10 DIAGNOSIS — R2681 Unsteadiness on feet: Secondary | ICD-10-CM | POA: Diagnosis not present

## 2019-08-10 DIAGNOSIS — R41841 Cognitive communication deficit: Secondary | ICD-10-CM | POA: Diagnosis not present

## 2019-08-10 DIAGNOSIS — M6281 Muscle weakness (generalized): Secondary | ICD-10-CM | POA: Diagnosis not present

## 2019-08-10 DIAGNOSIS — M47816 Spondylosis without myelopathy or radiculopathy, lumbar region: Secondary | ICD-10-CM | POA: Diagnosis not present

## 2019-08-10 DIAGNOSIS — G3184 Mild cognitive impairment, so stated: Secondary | ICD-10-CM | POA: Diagnosis not present

## 2019-08-12 ENCOUNTER — Ambulatory Visit: Payer: Medicare Other | Admitting: Rheumatology

## 2019-08-17 DIAGNOSIS — M545 Low back pain: Secondary | ICD-10-CM | POA: Diagnosis not present

## 2019-08-24 DIAGNOSIS — R41841 Cognitive communication deficit: Secondary | ICD-10-CM | POA: Diagnosis not present

## 2019-08-24 DIAGNOSIS — R2689 Other abnormalities of gait and mobility: Secondary | ICD-10-CM | POA: Diagnosis not present

## 2019-08-24 DIAGNOSIS — M6281 Muscle weakness (generalized): Secondary | ICD-10-CM | POA: Diagnosis not present

## 2019-08-24 DIAGNOSIS — G3184 Mild cognitive impairment, so stated: Secondary | ICD-10-CM | POA: Diagnosis not present

## 2019-08-24 DIAGNOSIS — R2681 Unsteadiness on feet: Secondary | ICD-10-CM | POA: Diagnosis not present

## 2019-09-01 DIAGNOSIS — G3184 Mild cognitive impairment, so stated: Secondary | ICD-10-CM | POA: Diagnosis not present

## 2019-09-01 DIAGNOSIS — R2681 Unsteadiness on feet: Secondary | ICD-10-CM | POA: Diagnosis not present

## 2019-09-01 DIAGNOSIS — R41841 Cognitive communication deficit: Secondary | ICD-10-CM | POA: Diagnosis not present

## 2019-09-01 DIAGNOSIS — M6281 Muscle weakness (generalized): Secondary | ICD-10-CM | POA: Diagnosis not present

## 2019-09-01 DIAGNOSIS — R2689 Other abnormalities of gait and mobility: Secondary | ICD-10-CM | POA: Diagnosis not present

## 2019-09-09 DIAGNOSIS — H10523 Angular blepharoconjunctivitis, bilateral: Secondary | ICD-10-CM | POA: Diagnosis not present

## 2019-09-09 DIAGNOSIS — H33322 Round hole, left eye: Secondary | ICD-10-CM | POA: Diagnosis not present

## 2019-09-09 DIAGNOSIS — H04123 Dry eye syndrome of bilateral lacrimal glands: Secondary | ICD-10-CM | POA: Diagnosis not present

## 2019-09-09 DIAGNOSIS — H40023 Open angle with borderline findings, high risk, bilateral: Secondary | ICD-10-CM | POA: Diagnosis not present

## 2019-09-09 DIAGNOSIS — H35373 Puckering of macula, bilateral: Secondary | ICD-10-CM | POA: Diagnosis not present

## 2019-10-26 DIAGNOSIS — H348112 Central retinal vein occlusion, right eye, stable: Secondary | ICD-10-CM | POA: Diagnosis not present

## 2019-10-26 DIAGNOSIS — H40013 Open angle with borderline findings, low risk, bilateral: Secondary | ICD-10-CM | POA: Diagnosis not present

## 2019-10-26 DIAGNOSIS — Z961 Presence of intraocular lens: Secondary | ICD-10-CM | POA: Diagnosis not present

## 2019-10-26 DIAGNOSIS — H33322 Round hole, left eye: Secondary | ICD-10-CM | POA: Diagnosis not present

## 2019-10-26 DIAGNOSIS — H35372 Puckering of macula, left eye: Secondary | ICD-10-CM | POA: Diagnosis not present

## 2019-11-02 DIAGNOSIS — R21 Rash and other nonspecific skin eruption: Secondary | ICD-10-CM | POA: Diagnosis not present

## 2019-11-02 DIAGNOSIS — R32 Unspecified urinary incontinence: Secondary | ICD-10-CM | POA: Diagnosis not present

## 2019-11-23 DIAGNOSIS — G3184 Mild cognitive impairment, so stated: Secondary | ICD-10-CM | POA: Diagnosis not present

## 2019-11-23 DIAGNOSIS — E039 Hypothyroidism, unspecified: Secondary | ICD-10-CM | POA: Diagnosis not present

## 2019-11-23 DIAGNOSIS — R32 Unspecified urinary incontinence: Secondary | ICD-10-CM | POA: Diagnosis not present

## 2019-11-23 DIAGNOSIS — M069 Rheumatoid arthritis, unspecified: Secondary | ICD-10-CM | POA: Diagnosis not present

## 2019-11-23 DIAGNOSIS — R269 Unspecified abnormalities of gait and mobility: Secondary | ICD-10-CM | POA: Diagnosis not present

## 2019-12-28 DIAGNOSIS — M79674 Pain in right toe(s): Secondary | ICD-10-CM | POA: Diagnosis not present

## 2019-12-28 DIAGNOSIS — M2170 Unequal limb length (acquired), unspecified site: Secondary | ICD-10-CM | POA: Diagnosis not present

## 2019-12-28 DIAGNOSIS — L84 Corns and callosities: Secondary | ICD-10-CM | POA: Diagnosis not present

## 2019-12-28 DIAGNOSIS — L600 Ingrowing nail: Secondary | ICD-10-CM | POA: Diagnosis not present

## 2019-12-28 DIAGNOSIS — M79672 Pain in left foot: Secondary | ICD-10-CM | POA: Diagnosis not present

## 2019-12-28 DIAGNOSIS — G609 Hereditary and idiopathic neuropathy, unspecified: Secondary | ICD-10-CM | POA: Diagnosis not present

## 2019-12-28 DIAGNOSIS — M898X7 Other specified disorders of bone, ankle and foot: Secondary | ICD-10-CM | POA: Diagnosis not present

## 2019-12-28 DIAGNOSIS — B351 Tinea unguium: Secondary | ICD-10-CM | POA: Diagnosis not present

## 2019-12-28 DIAGNOSIS — M79671 Pain in right foot: Secondary | ICD-10-CM | POA: Diagnosis not present

## 2019-12-28 DIAGNOSIS — M79675 Pain in left toe(s): Secondary | ICD-10-CM | POA: Diagnosis not present

## 2020-01-05 DIAGNOSIS — Z23 Encounter for immunization: Secondary | ICD-10-CM | POA: Diagnosis not present

## 2020-01-11 DIAGNOSIS — H40013 Open angle with borderline findings, low risk, bilateral: Secondary | ICD-10-CM | POA: Diagnosis not present

## 2020-01-11 DIAGNOSIS — H348112 Central retinal vein occlusion, right eye, stable: Secondary | ICD-10-CM | POA: Diagnosis not present

## 2020-01-11 DIAGNOSIS — H33322 Round hole, left eye: Secondary | ICD-10-CM | POA: Diagnosis not present

## 2020-01-11 DIAGNOSIS — Z961 Presence of intraocular lens: Secondary | ICD-10-CM | POA: Diagnosis not present

## 2020-01-11 DIAGNOSIS — H35372 Puckering of macula, left eye: Secondary | ICD-10-CM | POA: Diagnosis not present

## 2020-02-21 DIAGNOSIS — Z23 Encounter for immunization: Secondary | ICD-10-CM | POA: Diagnosis not present

## 2020-02-29 DIAGNOSIS — R2689 Other abnormalities of gait and mobility: Secondary | ICD-10-CM | POA: Diagnosis not present

## 2020-02-29 DIAGNOSIS — R413 Other amnesia: Secondary | ICD-10-CM | POA: Diagnosis not present

## 2020-03-07 DIAGNOSIS — H35372 Puckering of macula, left eye: Secondary | ICD-10-CM | POA: Diagnosis not present

## 2020-03-07 DIAGNOSIS — H33322 Round hole, left eye: Secondary | ICD-10-CM | POA: Diagnosis not present

## 2020-03-07 DIAGNOSIS — Z961 Presence of intraocular lens: Secondary | ICD-10-CM | POA: Diagnosis not present

## 2020-03-07 DIAGNOSIS — H04123 Dry eye syndrome of bilateral lacrimal glands: Secondary | ICD-10-CM | POA: Diagnosis not present

## 2020-03-07 DIAGNOSIS — H348111 Central retinal vein occlusion, right eye, with retinal neovascularization: Secondary | ICD-10-CM | POA: Diagnosis not present

## 2020-03-09 DIAGNOSIS — M79672 Pain in left foot: Secondary | ICD-10-CM | POA: Diagnosis not present

## 2020-03-09 DIAGNOSIS — M79674 Pain in right toe(s): Secondary | ICD-10-CM | POA: Diagnosis not present

## 2020-03-09 DIAGNOSIS — M79671 Pain in right foot: Secondary | ICD-10-CM | POA: Diagnosis not present

## 2020-03-09 DIAGNOSIS — B351 Tinea unguium: Secondary | ICD-10-CM | POA: Diagnosis not present

## 2020-03-09 DIAGNOSIS — M79675 Pain in left toe(s): Secondary | ICD-10-CM | POA: Diagnosis not present

## 2020-03-09 DIAGNOSIS — L84 Corns and callosities: Secondary | ICD-10-CM | POA: Diagnosis not present

## 2020-03-09 DIAGNOSIS — M898X7 Other specified disorders of bone, ankle and foot: Secondary | ICD-10-CM | POA: Diagnosis not present

## 2020-03-09 DIAGNOSIS — L600 Ingrowing nail: Secondary | ICD-10-CM | POA: Diagnosis not present

## 2020-03-09 DIAGNOSIS — G609 Hereditary and idiopathic neuropathy, unspecified: Secondary | ICD-10-CM | POA: Diagnosis not present

## 2020-04-08 ENCOUNTER — Emergency Department: Payer: Medicare Other

## 2020-04-08 ENCOUNTER — Inpatient Hospital Stay
Admission: AC | Admit: 2020-04-08 | Discharge: 2020-04-11 | DRG: 100 | Disposition: A | Discharge status: Rehabilitation Facility | Payer: Medicare Other | Attending: Internal Medicine | Admitting: Internal Medicine

## 2020-04-08 DIAGNOSIS — Z79890 Hormone replacement therapy (postmenopausal): Secondary | ICD-10-CM

## 2020-04-08 DIAGNOSIS — Z20822 Contact with and (suspected) exposure to covid-19: Secondary | ICD-10-CM | POA: Diagnosis present

## 2020-04-08 DIAGNOSIS — Z7982 Long term (current) use of aspirin: Secondary | ICD-10-CM

## 2020-04-08 DIAGNOSIS — E039 Hypothyroidism, unspecified: Secondary | ICD-10-CM | POA: Diagnosis present

## 2020-04-08 DIAGNOSIS — S0001XA Abrasion of scalp, initial encounter: Secondary | ICD-10-CM | POA: Diagnosis present

## 2020-04-08 DIAGNOSIS — W1830XA Fall on same level, unspecified, initial encounter: Secondary | ICD-10-CM | POA: Diagnosis present

## 2020-04-08 DIAGNOSIS — E1165 Type 2 diabetes mellitus with hyperglycemia: Secondary | ICD-10-CM | POA: Diagnosis present

## 2020-04-08 DIAGNOSIS — D649 Anemia, unspecified: Secondary | ICD-10-CM | POA: Diagnosis present

## 2020-04-08 DIAGNOSIS — J690 Pneumonitis due to inhalation of food and vomit: Secondary | ICD-10-CM | POA: Diagnosis present

## 2020-04-08 DIAGNOSIS — W19XXXA Unspecified fall, initial encounter: Secondary | ICD-10-CM | POA: Diagnosis present

## 2020-04-08 DIAGNOSIS — S0990XA Unspecified injury of head, initial encounter: Secondary | ICD-10-CM

## 2020-04-08 DIAGNOSIS — R079 Chest pain, unspecified: Secondary | ICD-10-CM

## 2020-04-08 DIAGNOSIS — S060X0A Concussion without loss of consciousness, initial encounter: Secondary | ICD-10-CM

## 2020-04-08 DIAGNOSIS — Z87891 Personal history of nicotine dependence: Secondary | ICD-10-CM

## 2020-04-08 DIAGNOSIS — M810 Age-related osteoporosis without current pathological fracture: Secondary | ICD-10-CM | POA: Diagnosis present

## 2020-04-08 DIAGNOSIS — R569 Unspecified convulsions: Principal | ICD-10-CM | POA: Diagnosis present

## 2020-04-08 DIAGNOSIS — D72829 Elevated white blood cell count, unspecified: Secondary | ICD-10-CM

## 2020-04-08 DIAGNOSIS — M069 Rheumatoid arthritis, unspecified: Secondary | ICD-10-CM | POA: Diagnosis present

## 2020-04-08 DIAGNOSIS — Z79899 Other long term (current) drug therapy: Secondary | ICD-10-CM

## 2020-04-08 DIAGNOSIS — M797 Fibromyalgia: Secondary | ICD-10-CM | POA: Diagnosis present

## 2020-04-08 DIAGNOSIS — R561 Post traumatic seizures: Secondary | ICD-10-CM | POA: Diagnosis present

## 2020-04-08 DIAGNOSIS — R110 Nausea: Secondary | ICD-10-CM

## 2020-04-08 DIAGNOSIS — E785 Hyperlipidemia, unspecified: Secondary | ICD-10-CM | POA: Diagnosis present

## 2020-04-08 DIAGNOSIS — J189 Pneumonia, unspecified organism: Secondary | ICD-10-CM

## 2020-04-08 DIAGNOSIS — S0003XA Contusion of scalp, initial encounter: Secondary | ICD-10-CM | POA: Diagnosis present

## 2020-04-08 DIAGNOSIS — I10 Essential (primary) hypertension: Secondary | ICD-10-CM | POA: Diagnosis present

## 2020-04-08 DIAGNOSIS — Z8673 Personal history of transient ischemic attack (TIA), and cerebral infarction without residual deficits: Secondary | ICD-10-CM

## 2020-04-08 DIAGNOSIS — G912 (Idiopathic) normal pressure hydrocephalus: Secondary | ICD-10-CM | POA: Diagnosis present

## 2020-04-08 DIAGNOSIS — Z66 Do not resuscitate: Secondary | ICD-10-CM | POA: Diagnosis present

## 2020-04-08 HISTORY — DX: Anemia, unspecified: D64.9

## 2020-04-08 HISTORY — DX: Transient cerebral ischemic attack, unspecified: G45.9

## 2020-04-08 HISTORY — DX: Disorder of thyroid, unspecified: E07.9

## 2020-04-08 HISTORY — DX: Age-related osteoporosis without current pathological fracture: M81.0

## 2020-04-08 HISTORY — DX: Unspecified osteoarthritis, unspecified site: M19.90

## 2020-04-08 HISTORY — DX: Fibromyalgia: M79.7

## 2020-04-08 HISTORY — DX: Hyperlipidemia, unspecified: E78.5

## 2020-04-08 LAB — GLUCOSE WHOLE BLOOD - POCT: Whole Blood Glucose POCT: 217 mg/dL — ABNORMAL HIGH (ref 70–100)

## 2020-04-08 LAB — PT AND APTT
PT INR: 1.1 (ref 0.9–1.1)
PT: 13.4 s — ABNORMAL HIGH (ref 10.1–12.9)
PTT: 36 s (ref 27–39)

## 2020-04-08 LAB — CBC
Absolute NRBC: 0.02 10*3/uL — ABNORMAL HIGH (ref 0.00–0.00)
Hematocrit: 34.1 % — ABNORMAL LOW (ref 34.7–43.7)
Hgb: 11 g/dL — ABNORMAL LOW (ref 11.4–14.8)
MCH: 30.4 pg (ref 25.1–33.5)
MCHC: 32.3 g/dL (ref 31.5–35.8)
MCV: 94.2 fL (ref 78.0–96.0)
MPV: 10.6 fL (ref 8.9–12.5)
Nucleated RBC: 0.1 /100 WBC — ABNORMAL HIGH (ref 0.0–0.0)
Platelets: 185 10*3/uL (ref 142–346)
RBC: 3.62 10*6/uL — ABNORMAL LOW (ref 3.90–5.10)
RDW: 16 % — ABNORMAL HIGH (ref 11–15)
WBC: 20.05 10*3/uL — ABNORMAL HIGH (ref 3.10–9.50)

## 2020-04-08 LAB — RAPID DRUG SCREEN, URINE
Barbiturate Screen, UR: NEGATIVE
Benzodiazepine Screen, UR: NEGATIVE
Cannabinoid Screen, UR: NEGATIVE
Cocaine, UR: NEGATIVE
Opiate Screen, UR: NEGATIVE
PCP Screen, UR: NEGATIVE
Urine Amphetamine Screen: NEGATIVE

## 2020-04-08 LAB — ABO/RH: ABO Rh: A POS

## 2020-04-08 LAB — TYPE AND SCREEN
AB Screen Gel: POSITIVE
ABO Rh: A POS

## 2020-04-08 LAB — ETHANOL: Alcohol: NOT DETECTED mg/dL

## 2020-04-08 LAB — ANTIBODY SCREEN, TUBE METHOD REFLEX: Antibody Screen Tube: NEGATIVE

## 2020-04-08 MED ORDER — SODIUM CHLORIDE 0.9 % IV SOLN
500.0000 mg | Freq: Once | INTRAVENOUS | Status: AC
Start: 2020-04-08 — End: 2020-04-08
  Administered 2020-04-08: 21:00:00 500 mg via INTRAVENOUS
  Filled 2020-04-08: qty 5

## 2020-04-08 MED ORDER — MORPHINE SULFATE 2 MG/ML IJ/IV SOLN (WRAP)
1.0000 mg | Status: DC | PRN
Start: 2020-04-08 — End: 2020-04-09
  Administered 2020-04-08: 1 mg via INTRAVENOUS
  Filled 2020-04-08: qty 1

## 2020-04-08 MED ORDER — IOHEXOL 350 MG/ML IV SOLN
100.0000 mL | Freq: Once | INTRAVENOUS | Status: AC | PRN
Start: 2020-04-08 — End: 2020-04-08
  Administered 2020-04-08: 18:00:00 100 mL via INTRAVENOUS

## 2020-04-08 MED ORDER — ONDANSETRON HCL 4 MG/2ML IJ SOLN
4.0000 mg | Freq: Three times a day (TID) | INTRAMUSCULAR | Status: DC | PRN
Start: 2020-04-08 — End: 2020-04-11
  Administered 2020-04-08 – 2020-04-09 (×2): 4 mg via INTRAVENOUS
  Filled 2020-04-08 (×2): qty 2

## 2020-04-08 MED ORDER — ONDANSETRON HCL 4 MG/2ML IJ SOLN
INTRAMUSCULAR | Status: AC
Start: 2020-04-08 — End: 2020-04-08
  Administered 2020-04-08: 17:00:00 4 mg
  Filled 2020-04-08: qty 2

## 2020-04-08 NOTE — ED Provider Notes (Addendum)
History     Chief Complaint   Patient presents with    Trauma    Head Injury     CC: Fall  HPI: Elderly female, status post fall/head injury PTA.  [facility] and was reported to have been nauseous prior and then fell forward striking her forehead on the floor area.  Patient with mental status change in route and unable to offer further historical details.  Unknown if anticoagulant use.    The history is provided by the EMS personnel. The history is limited by the condition of the patient.   Head Injury       Past Medical History:   Diagnosis Date    Anemia     Fibromyalgia     Hyperlipidemia     hypothyroid     Osteoporosis     Rheumatoid     TIA (transient ischemic attack)        Past Surgical History:   Procedure Laterality Date    CHOLECYSTECTOMY         History reviewed. No pertinent family history.    Social  Social History     Tobacco Use    Smoking status: Former Smoker    Smokeless tobacco: Not on file   Substance Use Topics    Alcohol use: Not Currently    Drug use: Never       .     Allergies   Allergen Reactions    Augmentin [Amoxicillin-Pot Clavulanate] Rash    Biaxin [Clarithromycin] Rash    Zithromax [Azithromycin] Rash       Home Medications     Med List Status: In Progress Set By: Loletha Grayer, RN at 04/08/2020  5:54 PM                aspirin 325 MG tablet     Take 325 mg by mouth daily     atorvastatin (LIPITOR) 40 MG tablet     Take 40 mg by mouth daily     calcium carbonate (OS-CAL) 600 MG Tab tablet     Take 600 mg by mouth daily     folic acid (FOLVITE) 1 MG tablet     Take 1 mg by mouth daily     levothyroxine (SYNTHROID) 75 MCG tablet     Take 75 mcg by mouth Once a day at 6:00am     methotrexate 2.5 MG tablet     Take by mouth once a week     Multiple Vitamin (multivitamin) capsule     Take 1 capsule by mouth daily     Omega-3 Fatty Acids (OMEGA-3 FISH OIL PO)     Take 1,000 mg by mouth daily     sertraline (ZOLOFT) 50 MG tablet     Take 50  mg by mouth daily     silver sulfADIAZINE (SILVADENE) 1 % cream     Apply topically daily     vitamin D (cholecalciferol) 25 MCG (1000 UT) tablet     Take 25 mcg by mouth daily           Review of Systems   Unable to perform ROS: Mental status change       Physical Exam    BP: (S) 154/76 (manual), Heart Rate: 71, Temp: 97 F (36.1 C), Resp Rate: 13, SpO2: 100 %, Weight: 76 kg    Physical Exam  Constitutional:       General: She is not in acute distress.  Appearance: She is well-developed.      Interventions: Cervical collar in place.   HENT:      Head: Normocephalic.        Right Ear: Tympanic membrane normal.      Left Ear: Tympanic membrane normal.      Ears:      Comments: Negative hemotympanum  Eyes:      Conjunctiva/sclera: Conjunctivae normal.      Pupils: Pupils are equal, round, and reactive to light.   Cardiovascular:      Rate and Rhythm: Normal rate and regular rhythm.   Pulmonary:      Effort: Pulmonary effort is normal. No respiratory distress.      Breath sounds: Normal breath sounds.   Abdominal:      Palpations: Abdomen is soft. There is no mass.      Tenderness: There is no abdominal tenderness.   Musculoskeletal:         General: No deformity. Normal range of motion.      Right shoulder: No deformity.      Cervical back: No spinous process tenderness or muscular tenderness.      Comments: Back: NT   Lymphadenopathy:      Cervical: No cervical adenopathy.   Skin:     General: Skin is warm.      Capillary Refill: Capillary refill takes less than 2 seconds.      Findings: No rash.   Neurological:      Mental Status: She is alert.      GCS: GCS eye subscore is 4. GCS verbal subscore is 4. GCS motor subscore is 6.      Comments: MAE, does not participate in detailed neurological exam   Psychiatric:         Mood and Affect: Affect is flat.         Speech: Speech is delayed.         Cognition and Memory: Cognition is impaired. Memory is impaired.           MDM and ED Course     ED Medication Orders  (From admission, onward)    Start Ordered     Status Ordering Provider    04/08/20 2007 04/08/20 2006  levETIRAcetam (KEPPRA) 500 mg in sodium chloride 0.9 % 100 mL IVPB  Once        Route: Intravenous  Ordered Dose: 500 mg     Ordered Shanna Strength J    04/08/20 1730 04/08/20 1730  iohexol (OMNIPAQUE) 350 MG/ML injection 100 mL  IMG once as needed        Route: Intravenous  Ordered Dose: 100 mL     Last MAR action: Imaging Agent Given Harrel Carina    04/08/20 1630 04/08/20 1630  ondansetron (ZOFRAN) 4 MG/2ML injection        Note to Pharmacy: Pascal Lux: cabinet override    Last MAR action: Given              MDM  Number of Diagnoses or Management Options  Concussion without loss of consciousness, initial encounter: new and requires workup  Fall, initial encounter: new and requires workup  Injury of head, initial encounter: new and requires workup  Leukocytosis, unspecified type: new and requires workup  Nausea: new and requires workup  Pneumonia due to infectious organism, unspecified laterality, unspecified part of lung: new and requires workup  Seizure: new and requires workup  Diagnosis management comments: EKG (interpreted by myself): Sinus rhythm,  normal axis, normal intervals, nonspecific ST changes    When I was within 6 feet of this patient I donned the following PPE:  Surgical Mask Yes, Gloves Yes, Gown No  ; Goggles Yes; Face Shield No  , 61M 6000 Respirator No  ; N95 Yes.  The patient was wearing a mask during my evaluation Yes.         Amount and/or Complexity of Data Reviewed  Clinical lab tests: ordered and reviewed  Tests in the radiology section of CPT: ordered and reviewed  Decide to obtain previous medical records or to obtain history from someone other than the patient: yes  Obtain history from someone other than the patient: yes  Review and summarize past medical records: yes  Discuss the patient with other providers: yes  Independent visualization of images, tracings, or  specimens: yes    Risk of Complications, Morbidity, and/or Mortality  Presenting problems: high  Diagnostic procedures: high  Management options: high    Patient Progress  Patient progress: other (comment) (Guarded )        ED Course as of 04/08/20 2027   Sat Apr 08, 2020   1926 Discussed with trauma service, who will consult and suggest medicine admission. [MP]   2007 Shortly ago, called to the room by daughter with concerns of how mother appears.  Daughter relates appears to have had a seizure and when I arrived her right arm was straight up in the air and her face was red and she was somewhat rigid, appearing confused.  ?Seizure versus cardiac event.  Will administer Keppra.  Updated trauma service, Darren, with events happening just prior and also suggest discussed with hospitalist service.  Discussed with hospitalist service, Dr. Jarome Lamas, who requests prolactin level. [MP]      ED Course User Index  [MP] Harrel Carina, MD             Procedures    Clinical Impression & Disposition     Clinical Impression  Final diagnoses:   Fall, initial encounter   Injury of head, initial encounter   Nausea   Leukocytosis, unspecified type   Pneumonia due to infectious organism, unspecified laterality, unspecified part of lung   Concussion without loss of consciousness, initial encounter   Seizure        ED Disposition     ED Disposition Condition Date/Time Comment    Admit to [facility]  Sat Apr 08, 2020  7:19 PM            New Prescriptions    No medications on file                 Harrel Carina, MD  04/08/20 2024       Harrel Carina, MD  04/08/20 2027

## 2020-04-08 NOTE — ED Notes (Signed)
Patient ID band placed on L wrist. Security and registration bedside

## 2020-04-08 NOTE — ED Notes (Signed)
[facility]  ED NURSING NOTE FOR THE RECEIVING INPATIENT NURSE   ED NURSE Rubin Payor [phone]   ED CHARGE RN Adelina Mings   ADMISSION INFORMATION   Barbee Mamula is a 87 y.o. female admitted with an ED diagnosis of:    1. Fall, initial encounter    2. Injury of head, initial encounter    3. Nausea    4. Leukocytosis, unspecified type    5. Pneumonia due to infectious organism, unspecified laterality, unspecified part of lung         Isolation: None   Allergies: Augmentin [amoxicillin-pot clavulanate], Biaxin [clarithromycin], and Zithromax [azithromycin]   Holding Orders confirmed? No   Belongings Documented? No   Home medications sent to pharmacy confirmed? No   NURSING CARE   Patient Comes From:   Mental Status: Assisted Living  alert and confused   ADL: Needs assistance   Ambulation: Unable to assess   Pertinent Information  and Safety Concerns: N/A     CT / NIH   CT Head ordered on this patient?  Yes   NIH/Dysphagia assessment done prior to admission? No   VITAL SIGNS (at the time of this note)      Vitals:    04/08/20 1923   BP: 157/63   Pulse: 74   Resp: 20   Temp:    SpO2: 94%

## 2020-04-08 NOTE — Consults (Signed)
ACUTE CARE SURGERY/TRAUMA CONSULT NOTE          Date Time: 04/08/20 6:46 PM  Patient Name: Yolanda Cox  Attending Physician: Harrel Carina, MD  Type of Admission: Emergency    Thank you Harrel Carina, MD for consulting [place] on Yolanda Cox,Yolanda Cox HOWELL for:     REASON FOR CONSULTATION:   Modified trauma activation - witnessed fall, ams  CHIEF COMPLAINT:     Chief Complaint   Patient presents with   . Trauma   . Head Injury      ASSESSMENT/PLAN:   The patient has the following active problems:  There is no problem list on file for this patient.      Closed head injury, AMS, HCT showing no evidence of acute intracranial abnormality.  -neuro checks q 4  -repeat hct for any acute change  -hob 30 degrees    Mild Anterior wege compression fracture of T7, age indeterminate, no acute findings.  -no tenderness on exam, likely old.    Pleural Effusions, unknown cardiac history in EMR    Leukocytosis, groundglass opacities within the right lower lobe could represent infection on chest ct. Patient afebrile upon arrival, wbc 20K, normo tensive.  -likely need medicine w/u for etiology.    No obvious injuries on arrival, given patients ams unable to complete tertiary at this time, if she is admitted will round on her tomorrow 1/9.    HPI:     Yolanda Cox is a 87 y.o. who has  has a past medical history of Anemia, Fibromyalgia, Hyperlipidemia, hypothyroid, Osteoporosis, Rheumatoid, and TIA (transient ischemic attack). who presented after fall from standing outside with altered mental status.  Yolanda Cox was responding to EMS however on presentation is confused and not answering questions.  She is  Moving all extremities. Onset was just prior to arrival. Symptoms have been unchanged since. No aggravating symptoms.  Alleviating factors include none. Associated symptoms include none. The patient denies none.     Of note patient is a resident of [facility].  History is limited by the condition of the  patient.  ALLERGIES:     Allergies   Allergen Reactions   . Augmentin [Amoxicillin-Pot Clavulanate] Rash   . Biaxin [Clarithromycin] Rash   . Zithromax [Azithromycin] Rash     MEDICATIONS:   (Not in a hospital admission)    PAST MEDICAL HISTORY:     Past Medical History:   Diagnosis Date   . Anemia    . Fibromyalgia    . Hyperlipidemia    . hypothyroid    . Osteoporosis    . Rheumatoid    . TIA (transient ischemic attack)      PAST SURGICAL HISTORY:     Past Surgical History:   Procedure Laterality Date   . CHOLECYSTECTOMY       FAMILY HISTORY:   History reviewed. No pertinent family history.  SOCIAL HISTORY:     Social History     Socioeconomic History   . Marital status: Widowed     Spouse name: Not on file   . Number of children: Not on file   . Years of education: Not on file   . Highest education level: Not on file   Occupational History   . Not on file   Tobacco Use   . Smoking status: Former Games developer   . Smokeless tobacco: Not on file   Substance and Sexual Activity   . Alcohol use: Not Currently   . Drug  use: Never   . Sexual activity: Not on file   Other Topics Concern   . Not on file   Social History Narrative   . Not on file     Social Determinants of Health     Financial Resource Strain:    . Difficulty of Paying Living Expenses: Not on file   Food Insecurity:    . Worried About Programme researcher, broadcasting/film/video in the Last Year: Not on file   . Ran Out of Food in the Last Year: Not on file   Transportation Needs:    . Lack of Transportation (Medical): Not on file   . Lack of Transportation (Non-Medical): Not on file   Physical Activity:    . Days of Exercise per Week: Not on file   . Minutes of Exercise per Session: Not on file   Stress:    . Feeling of Stress : Not on file   Social Connections:    . Frequency of Communication with Friends and Family: Not on file   . Frequency of Social Gatherings with Friends and Family: Not on file   . Attends Religious Services: Not on file   . Active Member of Clubs or  Organizations: Not on file   . Attends Banker Meetings: Not on file   . Marital Status: Not on file   Intimate Partner Violence:    . Fear of Current or Ex-Partner: Not on file   . Emotionally Abused: Not on file   . Physically Abused: Not on file   . Sexually Abused: Not on file   Housing Stability:    . Unable to Pay for Housing in the Last Year: Not on file   . Number of Places Lived in the Last Year: Not on file   . Unstable Housing in the Last Year: Not on file     REVIEW OF SYSTEMS:     General: negative for fever, chills, lethargy  Ophthalmic: negative for decreased, blurry or double vision  ENT: negative for epistaxis, headaches, nasal discharge  Respiratory: negative for cough, pleuritic pain, shortness of breath,   Cardiovascular: negative for chest pain, edema, loss of consciousness, palpitations,  Gastrointestinal: negative for abdominal pain, nausea, vomiting, change in oral intake  Genitourinary: negative for pelvic pain or dysuria  Musculoskeletal: negative for joint pain, muscular weakness  Neurological: negative for confusion, dizziness, headaches, impaired balance, numbness, tingling, weakness, or visual changes  Dermatological: negative for rash    PHYSICAL EXAM:     Vitals:    04/08/20 1638 04/08/20 1642 04/08/20 1725 04/08/20 1727   BP:  176/72 144/57    Pulse:  78 68    Resp:  14 14    Temp:       SpO2:  100% 97%    Weight: 76 kg (167 lb 8.8 oz)   74.6 kg (164 lb 7.4 oz)   Height:    1.618 m (5' 3.7")       Constitutional: vital signs reviewed, appears comfortable and no acute distress  Eyes: pupils equal and reactive to light and extraocular eye movements intact  Head: facial bones nontender  Anterior neck: no wound, contusions, or hematoma and trachea midline  Cervical spine: nontender  Chest wall: chest wall non tender  Lungs: breath sounds clear and equal bilaterally  Heart: regular rhythm and NSR Pulses equal  Abdomen: soft and non-tender  Pelvis: stable  Extremities: no  contusions and no deformities  Thoracic and lumbar spine:  tenderness: absent  Neurologic: GCS: 11, motor and sensory exam: normal  Fast Exam: deferred    Patient Lines/Drains/Airways Status     Active PICC Line / CVC Line / PIV Line / Drain / Airway / Intraosseous Line / Epidural Line / ART Line / Line / Wound / Pressure Ulcer / NG/OG Tube     Name Placement date Placement time Site Days    Peripheral IV 04/08/20 18 G Left Antecubital 04/08/20  1640  Antecubital  less than 1    External Urinary Catheter 04/08/20  1746  --  less than 1              Intake and Output Summary (Last 24 hours) at Date Time    Intake/Output Summary (Last 24 hours) at 04/08/2020 1846  Last data filed at 04/08/2020 1638  Gross per 24 hour   Intake 0 ml   Output 0 ml   Net 0 ml     LABS:     Results     Procedure Component Value Units Date/Time    CBC without differential [[id number]]  (Abnormal) Collected: 04/08/20 1639    Specimen: Blood Updated: 04/08/20 1836     WBC 20.05 x10 3/uL      Hgb 11.0 g/dL      Hematocrit 03.4 %      Platelets 185 x10 3/uL      RBC 3.62 x10 6/uL      MCV 94.2 fL      MCH 30.4 pg      MCHC 32.3 g/dL      RDW 16 %      MPV 10.6 fL      Nucleated RBC 0.1 /100 WBC      Absolute NRBC 0.02 x10 3/uL     ABO/Rh [[id number]] Collected: 04/08/20 1733    Specimen: Blood Updated: 04/08/20 1738    PT/APTT [[id number]]  (Abnormal) Collected: 04/08/20 1639     Updated: 04/08/20 1702     PT 13.4 sec      PT INR 1.1     PTT 36 sec     Ethanol (Alcohol) Level [[id number]] Collected: 04/08/20 1639    Specimen: Blood Updated: 04/08/20 1700     Alcohol None Detected mg/dL     Type and Screen [[id number]] Collected: 04/08/20 1639    Specimen: Blood Updated: 04/08/20 1648        RADS:   Radiological procedure personally reviewed:     CT Reconstruction T-spine    Result Date: 04/08/2020   Mild anterior wedge compression fracture at T7 is age indeterminate, but has imaging features of chronicity. No definitive acute thoracic vertebral fracture,  compression deformity or traumatic malalignment. Diffuse thoracic spondylosis. Waynard Edwards, MD  04/08/2020 6:00 PM    CT Head without  Contrast    Result Date: 04/08/2020   1. No CT evidence of acute intracranial abnormality. 2. Cerebral volume loss, intracranial atherosclerosis and moderate sequela of chronic small vessel ischemic disease. Waynard Edwards, MD  04/08/2020 5:03 PM    CT Angiogram Chest    Result Date: 04/08/2020  COMBINED   No definite acute pathology noted within the chest abdomen or pelvis. Groundglass opacities within the right lower lobe could represent infection. Trace left pleural effusion. Jimmye Norman, MD  04/08/2020 6:21 PM    CT Cervical Spine WO Contrast    Result Date: 04/08/2020   No acute cervical spine fracture or traumatic malalignment. Cervical spondylosis and facet arthrosis with degenerative spondylolisthesis as  described. Waynard Edwards, MD  04/08/2020 5:48 PM    CT Abdomen Pelvis W IV/ WO PO Cont    Result Date: 04/08/2020  COMBINED   No definite acute pathology noted within the chest abdomen or pelvis. Groundglass opacities within the right lower lobe could represent infection. Trace left pleural effusion. Jimmye Norman, MD  04/08/2020 6:21 PM     Chest AP Portable    Result Date: 04/08/2020  . Marland Kitchen Mild left basilar atelectasis Marty Heck, MD  04/08/2020 4:44 PM    CT Reconstruction L-spine    Result Date: 04/08/2020   No acute lumbar vertebral fracture, compression deformity or traumatic malalignment. Degenerative spondylosis and facet arthrosis superimposed upon S-shaped rotatory scoliosis, multilevel lateral listhesis and L5-S1 anterolisthesis. Waynard Edwards, MD  04/08/2020 5:52 PM    EKG Results     Procedure Component Value Units Date/Time    ECG 12 Lead [[id number]] Collected: 04/08/20 1636     Updated: 04/08/20 1719     Ventricular Rate 70 BPM      Atrial Rate 70 BPM      P-R Interval 158 ms      QRS Duration 82 ms      Q-T Interval 406 ms      QTC Calculation (Bezet)  438 ms      P Axis 69 degrees      R Axis 42 degrees      T Axis 60 degrees     Narrative:      NORMAL SINUS RHYTHM  NONSPECIFIC T WAVE ABNORMALITY  ABNORMAL ECG  NO PREVIOUS ECGS AVAILABLE          I have personally reviewed the patient's history and 24 hour interval events, along with vitals, labs, radiology images and nursing. So far today I have spent 50 minutes providing care for this patient excluding teaching and billable procedures, and not overlapping with any other providers.    SIGNED BY:     Electronically signed by:    Toma Deiters, AGACNP-BC      Attending Attestation:   I have directly reviewed the clinical findings, labs, imaging studies and management of this patient in detail. I have interviewed and examined the patient and agree with the documentation, assessment and plan of  Yolanda Cox  as recorded by Toma Deiters, AGACNP-BC     Alvy Beal, MD        *Due to pandemic of coronavirus and based on hospital policies and procedures ; this document may contain, but not limited to, information based on direct patient contact, observation , telephone conversation, use of A/V devices , medical records, conversation with other treatment team members, information and physical exam by other treatment team members.  Not all errors are caught or corrected. If there are questions or concerns about the content of this note or information contained within the body of this document they should be addressed directly with the author for clarification.    *This note was generated by the Epic EMR system/ Dragon speech recognition and may contain inherent errors or omissions not intended by the user. Grammatical errors, random word insertions, deletions, pronoun errors and incomplete sentences are occasional consequences of this technology due to software limitations. Not all errors are caught or corrected. If there are questions or concerns about the content of this note or information contained  within the body of this dictation they should be addressed directly with the author for clarification

## 2020-04-08 NOTE — ED Triage Notes (Addendum)
Patient fall, no blood thinners. Witnessed by care facility staff @ [facility]    ALTERED MENTAL STATUS AFTER FALL- [phone]

## 2020-04-09 ENCOUNTER — Inpatient Hospital Stay: Payer: Medicare Other

## 2020-04-09 ENCOUNTER — Other Ambulatory Visit: Payer: Self-pay

## 2020-04-09 DIAGNOSIS — Y92099 Unspecified place in other non-institutional residence as the place of occurrence of the external cause: Secondary | ICD-10-CM

## 2020-04-09 DIAGNOSIS — R404 Transient alteration of awareness: Secondary | ICD-10-CM

## 2020-04-09 DIAGNOSIS — S0003XA Contusion of scalp, initial encounter: Secondary | ICD-10-CM

## 2020-04-09 DIAGNOSIS — W19XXXA Unspecified fall, initial encounter: Secondary | ICD-10-CM | POA: Diagnosis present

## 2020-04-09 LAB — ECG 12-LEAD
Atrial Rate: 70 {beats}/min
P Axis: 69 degrees
P-R Interval: 158 ms
Q-T Interval: 406 ms
QRS Duration: 82 ms
QTC Calculation (Bezet): 438 ms
R Axis: 42 degrees
T Axis: 60 degrees
Ventricular Rate: 70 {beats}/min

## 2020-04-09 LAB — COMPREHENSIVE METABOLIC PANEL
ALT: 11 U/L (ref 0–55)
AST (SGOT): 20 U/L (ref 5–34)
Albumin/Globulin Ratio: 1.3 (ref 0.9–2.2)
Albumin: 3.9 g/dL (ref 3.5–5.0)
Alkaline Phosphatase: 57 U/L (ref 37–106)
Anion Gap: 9 (ref 5.0–15.0)
BUN: 15.3 mg/dL (ref 7.0–19.0)
Bilirubin, Total: 1.6 mg/dL — ABNORMAL HIGH (ref 0.2–1.2)
CO2: 24 mEq/L (ref 22–29)
Calcium: 8.9 mg/dL (ref 7.9–10.2)
Chloride: 105 mEq/L (ref 100–111)
Creatinine: 0.8 mg/dL (ref 0.6–1.0)
Globulin: 3.1 g/dL (ref 2.0–3.6)
Glucose: 208 mg/dL — ABNORMAL HIGH (ref 70–100)
Potassium: 4 mEq/L (ref 3.5–5.1)
Protein, Total: 7 g/dL (ref 6.0–8.3)
Sodium: 138 mEq/L (ref 136–145)

## 2020-04-09 LAB — MAGNESIUM: Magnesium: 2.1 mg/dL (ref 1.6–2.6)

## 2020-04-09 LAB — I-STAT CHEM 8 CARTRIDGE
BUN I-Stat: 17 mg/dL (ref 8–26)
Chloride I-Stat: 105 mEq/L (ref 98–109)
Creatinine I-Stat: 0.6 mg/dL (ref 0.6–1.3)
Glucose I-Stat: 221 mg/dL — ABNORMAL HIGH (ref 70–100)
Hematocrit I-Stat: 34 % — ABNORMAL LOW (ref 37.0–47.0)
Hemoglobin I-Stat: 11.6 g/dL — ABNORMAL LOW (ref 12.0–16.0)
Potassium I-Stat: 3.6 mEq/L (ref 3.5–4.9)
Sodium I-Stat: 140 mEq/L (ref 138–146)
i-STAT CO2: 23 mEq/L — ABNORMAL LOW (ref 24–29)
i-STAT Calcium Ionized: 2.4 mEq/L (ref 2.24–2.64)

## 2020-04-09 LAB — TSH: TSH: 1.69 u[IU]/mL (ref 0.35–4.94)

## 2020-04-09 LAB — TROPONIN I
Troponin I: 0.01 ng/mL (ref 0.00–0.05)
Troponin I: <0.01 ng/mL (ref 0.00–0.05)

## 2020-04-09 LAB — PROLACTIN: Prolactin: 40.2 ng/mL — ABNORMAL HIGH (ref 5.2–26.5)

## 2020-04-09 LAB — LIPID PANEL
Cholesterol / HDL Ratio: 3.7
Cholesterol: 108 mg/dL (ref 0–199)
HDL: 29 mg/dL — ABNORMAL LOW (ref 40–9999)
LDL Calculated: 63 mg/dL (ref 0–99)
Triglycerides: 78 mg/dL (ref 34–149)
VLDL Calculated: 16 mg/dL (ref 10–40)

## 2020-04-09 LAB — ACETAMINOPHEN LEVEL: Acetaminophen Level: <7 ug/mL — ABNORMAL LOW (ref 10–30)

## 2020-04-09 LAB — COVID-19 (SARS-COV-2): SARS CoV 2 Overall Result: NEGATIVE

## 2020-04-09 LAB — SALICYLATE LEVEL: Salicylate Level: <5 mg/dL — ABNORMAL LOW (ref 15.0–30.0)

## 2020-04-09 LAB — PHOSPHORUS: Phosphorus: 3.2 mg/dL (ref 2.3–4.7)

## 2020-04-09 LAB — GFR: EGFR: >60

## 2020-04-09 MED ORDER — FOLIC ACID 1 MG PO TABS
1.0000 mg | ORAL_TABLET | Freq: Every day | ORAL | Status: DC
Start: 2020-04-09 — End: 2020-04-11
  Administered 2020-04-09 – 2020-04-11 (×3): 1 mg via ORAL
  Filled 2020-04-09 (×3): qty 1

## 2020-04-09 MED ORDER — DEXTROSE 50 % IV SOLN
12.5000 g | INTRAVENOUS | Status: DC | PRN
Start: 2020-04-09 — End: 2020-04-11

## 2020-04-09 MED ORDER — SODIUM CHLORIDE 0.9 % IV MBP
1.0000 g | INTRAVENOUS | Status: DC
Start: 2020-04-10 — End: 2020-04-09

## 2020-04-09 MED ORDER — LEVOFLOXACIN IN D5W 750 MG/150ML IV SOLN
750.0000 mg | INTRAVENOUS | Status: DC
Start: 2020-04-09 — End: 2020-04-09

## 2020-04-09 MED ORDER — SODIUM CHLORIDE 0.9 % IV SOLN
300.0000 mg | Freq: Three times a day (TID) | INTRAVENOUS | Status: DC
Start: 2020-04-09 — End: 2020-04-09
  Administered 2020-04-09: 12:00:00 300 mg via INTRAVENOUS
  Filled 2020-04-09 (×2): qty 2

## 2020-04-09 MED ORDER — LORAZEPAM 2 MG/ML IJ SOLN
2.0000 mg | Freq: Three times a day (TID) | INTRAMUSCULAR | Status: DC | PRN
Start: 2020-04-09 — End: 2020-04-11

## 2020-04-09 MED ORDER — ASPIRIN 325 MG PO TABS
325.0000 mg | ORAL_TABLET | Freq: Every day | ORAL | Status: DC
Start: 2020-04-09 — End: 2020-04-11
  Administered 2020-04-09 – 2020-04-11 (×3): 325 mg via ORAL
  Filled 2020-04-09 (×3): qty 1

## 2020-04-09 MED ORDER — SODIUM CHLORIDE 0.9 % IV MBP
4.5000 g | Freq: Four times a day (QID) | INTRAVENOUS | Status: DC
Start: 2020-04-09 — End: 2020-04-11
  Administered 2020-04-09 – 2020-04-11 (×8): 4.5 g via INTRAVENOUS
  Filled 2020-04-09 (×2): qty 20
  Filled 2020-04-09: qty 100
  Filled 2020-04-09 (×2): qty 20
  Filled 2020-04-09: qty 100
  Filled 2020-04-09 (×5): qty 20

## 2020-04-09 MED ORDER — GLUCOSE 40 % PO GEL
15.0000 g | ORAL | Status: DC | PRN
Start: 2020-04-09 — End: 2020-04-11

## 2020-04-09 MED ORDER — BUTALBITAL-APAP-CAFFEINE 50-325-40 MG PO TABS
1.0000 | ORAL_TABLET | ORAL | Status: DC | PRN
Start: 2020-04-09 — End: 2020-04-11
  Administered 2020-04-09 – 2020-04-10 (×3): 1 via ORAL
  Filled 2020-04-09 (×3): qty 1

## 2020-04-09 MED ORDER — METHYLPREDNISOLONE SODIUM SUCC 125 MG IJ SOLR
125.0000 mg | Freq: Once | INTRAMUSCULAR | Status: DC | PRN
Start: 2020-04-09 — End: 2020-04-11

## 2020-04-09 MED ORDER — SERTRALINE HCL 50 MG PO TABS
50.0000 mg | ORAL_TABLET | Freq: Every day | ORAL | Status: DC
Start: 2020-04-09 — End: 2020-04-11
  Administered 2020-04-09 – 2020-04-11 (×3): 50 mg via ORAL
  Filled 2020-04-09 (×3): qty 1

## 2020-04-09 MED ORDER — METHYLPREDNISOLONE SODIUM SUCC 125 MG IJ SOLR
125.0000 mg | Freq: Once | INTRAMUSCULAR | Status: DC | PRN
Start: 2020-04-09 — End: 2020-04-09

## 2020-04-09 MED ORDER — LEVOTHYROXINE SODIUM 75 MCG PO TABS
75.0000 ug | ORAL_TABLET | Freq: Every day | ORAL | Status: DC
Start: 2020-04-09 — End: 2020-04-11
  Administered 2020-04-09 – 2020-04-11 (×3): 75 ug via ORAL
  Filled 2020-04-09 (×3): qty 1

## 2020-04-09 MED ORDER — GUAIFENESIN 100 MG/5ML PO SOLN
200.0000 mg | ORAL | Status: DC | PRN
Start: 2020-04-09 — End: 2020-04-11

## 2020-04-09 MED ORDER — RISAQUAD PO CAPS
1.0000 | ORAL_CAPSULE | Freq: Every day | ORAL | Status: DC
Start: 2020-04-09 — End: 2020-04-11
  Administered 2020-04-09 – 2020-04-11 (×3): 1 via ORAL
  Filled 2020-04-09 (×3): qty 1

## 2020-04-09 MED ORDER — SODIUM CHLORIDE 0.9 % IV SOLN
300.0000 mg | Freq: Once | INTRAVENOUS | Status: AC
Start: 2020-04-09 — End: 2020-04-09
  Administered 2020-04-09: 06:00:00 300 mg via INTRAVENOUS
  Filled 2020-04-09: qty 2

## 2020-04-09 MED ORDER — CALCIUM CITRATE-VITAMIN D 315-250 MG-UNIT PO TABS
2.0000 | ORAL_TABLET | Freq: Every day | ORAL | Status: DC
Start: 2020-04-09 — End: 2020-04-11
  Administered 2020-04-09 – 2020-04-11 (×3): 2 via ORAL
  Filled 2020-04-09 (×3): qty 2

## 2020-04-09 MED ORDER — VITAMIN D 25 MCG (1000 UT) PO TABS
25.0000 ug | ORAL_TABLET | Freq: Every day | ORAL | Status: DC
Start: 2020-04-09 — End: 2020-04-11
  Administered 2020-04-09 – 2020-04-11 (×3): 25 ug via ORAL
  Filled 2020-04-09 (×3): qty 1

## 2020-04-09 MED ORDER — ALBUTEROL-IPRATROPIUM 2.5-0.5 (3) MG/3ML IN SOLN
3.0000 mL | Freq: Four times a day (QID) | RESPIRATORY_TRACT | Status: DC | PRN
Start: 2020-04-09 — End: 2020-04-11

## 2020-04-09 MED ORDER — LEVETIRACETAM 500 MG PO TABS
500.0000 mg | ORAL_TABLET | Freq: Two times a day (BID) | ORAL | Status: DC
Start: 2020-04-09 — End: 2020-04-11
  Administered 2020-04-09 – 2020-04-11 (×4): 500 mg via ORAL
  Filled 2020-04-09 (×4): qty 1

## 2020-04-09 MED ORDER — ACETAMINOPHEN 325 MG PO TABS
650.0000 mg | ORAL_TABLET | Freq: Four times a day (QID) | ORAL | Status: DC | PRN
Start: 2020-04-09 — End: 2020-04-11
  Administered 2020-04-09 (×2): 650 mg via ORAL
  Filled 2020-04-09 (×2): qty 2

## 2020-04-09 MED ORDER — ATORVASTATIN CALCIUM 20 MG PO TABS
40.0000 mg | ORAL_TABLET | Freq: Every day | ORAL | Status: DC
Start: 2020-04-09 — End: 2020-04-11
  Administered 2020-04-09 – 2020-04-10 (×2): 40 mg via ORAL
  Filled 2020-04-09 (×2): qty 2

## 2020-04-09 MED ORDER — DIPHENHYDRAMINE HCL 50 MG/ML IJ SOLN
25.0000 mg | Freq: Once | INTRAMUSCULAR | Status: DC | PRN
Start: 2020-04-09 — End: 2020-04-09

## 2020-04-09 MED ORDER — GADOTERATE MEGLUMINE 10 MMOL/20ML IV SOLN (CLARISCAN)
17.0000 mL | Freq: Once | INTRAVENOUS | Status: AC | PRN
Start: 2020-04-09 — End: 2020-04-09
  Administered 2020-04-09: 22:00:00 17 mL via INTRAVENOUS

## 2020-04-09 MED ORDER — LEVOFLOXACIN IN D5W 750 MG/150ML IV SOLN
750.0000 mg | Freq: Once | INTRAVENOUS | Status: AC
Start: 2020-04-09 — End: 2020-04-09
  Administered 2020-04-09: 05:00:00 750 mg via INTRAVENOUS
  Filled 2020-04-09: qty 150

## 2020-04-09 MED ORDER — MELATONIN 3 MG PO TABS
3.0000 mg | ORAL_TABLET | Freq: Every evening | ORAL | Status: DC | PRN
Start: 2020-04-09 — End: 2020-04-11

## 2020-04-09 MED ORDER — GLUCAGON 1 MG IJ SOLR (WRAP)
1.0000 mg | INTRAMUSCULAR | Status: DC | PRN
Start: 2020-04-09 — End: 2020-04-11

## 2020-04-09 MED ORDER — NALOXONE HCL 0.4 MG/ML IJ SOLN (WRAP)
0.2000 mg | INTRAMUSCULAR | Status: DC | PRN
Start: 2020-04-09 — End: 2020-04-11

## 2020-04-09 MED ORDER — TAB-A-VITE/BETA CAROTENE PO TABS
1.0000 | ORAL_TABLET | Freq: Every day | ORAL | Status: DC
Start: 2020-04-09 — End: 2020-04-11
  Administered 2020-04-09 – 2020-04-11 (×3): 1 via ORAL
  Filled 2020-04-09 (×3): qty 1

## 2020-04-09 MED ORDER — DIPHENHYDRAMINE HCL 50 MG/ML IJ SOLN
25.0000 mg | Freq: Once | INTRAMUSCULAR | Status: DC | PRN
Start: 2020-04-09 — End: 2020-04-11

## 2020-04-09 NOTE — Progress Notes (Addendum)
Pt seen and examined.  Patient was sent from [facility] yesterday after she had a syncopal episode.  Patient informs she remembers feeling nauseous and was walking to the dinner to ask for help but passed out before that.  While in the ER she had a episode of tonic-clonic seizure as described by her daughter.  Patient has history of traumatic brain bleed in [place] about a year ago.  She also has history of normal pressure hydrocephalus    New onset seizure  Syncopal episode  Fall with scalp abrasion  -Check EEG, MRI with contrast  -requested neurology evaluation-discussed the case with Dr. Marney Doctor  -Seizure precautions  -Ativan as needed  -Started on Keppra 500 twice daily as per Dr. Marney Doctor    Possible aspiration pneumonia-possibly related to seizures  -Was initially treated with Levaquin and clindamycin in view of her penicillin and Biaxin allergy  -Discussed with the daughter and patient today-she does not have a true allergy she just had a side effect of alteration in taste  -We will start Zosyn  - Probiotics  - Nebs as needed  - Incentive spirometry  - F/u CXR in 4-6 weeks for resolution  - Robitussin PRN    Closed head injury after a fall  -CT head shows no evidence of acute intracranial abnormality  -Continue neurochecks every 4 hours  -We will repeat CT head if she has any acute changes  -Tylenol and Fioricet as needed for headache  -Could follow-up in concussion clinic if she has persistent symptoms after few weeks    Mild anterior wedge compression fracture of T7  -Age-indeterminate, no acute findings  -No tenderness on exam    Hyperglycemia at the time of admission   -check hemoglobin A1c    Hyperlipidemia  -Continue Lipitor    Hypothyroidism  Continue Synthroid    History of rheumatoid arthritis  -On methotrexate weekly    History of osteoporosis  -Continue Os-Cal and vitamin D    Updated daughter at the bedside    Fredirick Maudlin MD

## 2020-04-09 NOTE — Progress Notes (Signed)
Received patient from ED via stretcher. Unable to ambulate at this time d/t lethargy and some confusion. Only Alert to self. Remembers she fell. VS stable on RA. No SOB noted. Patient is nauseated with small emesis noted; Zofran IV given. Follows commands appropriately. Currently NPO; neuro consult this AM. External cath in place. Telemetry box 038 on.  Patient oriented to the room and usage of call bell. Bed alarm on.

## 2020-04-09 NOTE — Consults (Signed)
Neurology Consult    Date Time: 04/09/20 6:58 PM  Patient Name: Yolanda Cox  Attending Physician: Deloris Ping, MD    History of Presenting Illness:   Yolanda Cox is a 87 y.o. female  brought in by EMS status post fall and head injury at the [employer] [facility]. Per EMR, patient was walking at the retirement community that she is a resident of when she fell forward striking her forehead on the floor, patient had been reportedly nauseated prior to the fall. Per EMR, patient had a change in her mental status while in route to the hospital. At this time patient has a flat affect and very delayed responses, patient is alert to self and place. Patient reports of a headache and points to the back of her head and reports of a back ache, unable to provide detailed history at this time.   Patient daughter was in room and also provided some of her history.  History includes a previous fall and cerebral hemorrhage when she was worked up at [facility]  Past Medical History:   Diagnosis Date    Anemia     Fibromyalgia     Hyperlipidemia     hypothyroid     Osteoporosis     Rheumatoid     TIA (transient ischemic attack)      Past Surgical History:   Procedure Laterality Date    CHOLECYSTECTOMY       History reviewed. No pertinent family history.  Social History     Socioeconomic History    Marital status: Widowed     Spouse name: Not on file    Number of children: Not on file    Years of education: Not on file    Highest education level: Not on file   Occupational History    Not on file   Tobacco Use    Smoking status: Former Smoker    Smokeless tobacco: Not on file   Substance and Sexual Activity    Alcohol use: Not Currently    Drug use: Never    Sexual activity: Not on file   Other Topics Concern    Not on file   Social History Narrative    Not on file     Social Determinants of Health     Financial Resource Strain:     Difficulty of Paying Living  Expenses: Not on file   Food Insecurity:     Worried About Running Out of Food in the Last Year: Not on file    Ran Out of Food in the Last Year: Not on file   Transportation Needs:     Lack of Transportation (Medical): Not on file    Lack of Transportation (Non-Medical): Not on file   Physical Activity:     Days of Exercise per Week: Not on file    Minutes of Exercise per Session: Not on file   Stress:     Feeling of Stress : Not on file   Social Connections:     Frequency of Communication with Friends and Family: Not on file    Frequency of Social Gatherings with Friends and Family: Not on file    Attends Religious Services: Not on file    Active Member of Clubs or Organizations: Not on file    Attends Banker Meetings: Not on file    Marital Status: Not on file   Intimate Partner Violence:     Fear of Current  or Ex-Partner: Not on file    Emotionally Abused: Not on file    Physically Abused: Not on file    Sexually Abused: Not on file   Housing Stability:     Unable to Pay for Housing in the Last Year: Not on file    Number of Places Lived in the Last Year: Not on file    Unstable Housing in the Last Year: Not on file     Allergies   Allergen Reactions    Augmentin [Amoxicillin-Pot Clavulanate] Rash    Biaxin [Clarithromycin] Rash    Zithromax [Azithromycin] Rash     Medications Prior to Admission   Medication Sig    aspirin 325 MG tablet Take 325 mg by mouth daily    atorvastatin (LIPITOR) 40 MG tablet Take 40 mg by mouth daily    calcium carbonate (OS-CAL) 600 MG Tab tablet Take 600 mg by mouth daily    folic acid (FOLVITE) 1 MG tablet Take 1 mg by mouth daily    levothyroxine (SYNTHROID) 75 MCG tablet Take 75 mcg by mouth Once a day at 6:00am    methotrexate 2.5 MG tablet Take by mouth once a week    Multiple Vitamin (multivitamin) capsule Take 1 capsule by mouth daily    Omega-3 Fatty Acids (OMEGA-3 FISH OIL PO) Take 1,000 mg by mouth daily    sertraline (ZOLOFT)  50 MG tablet Take 50 mg by mouth daily    silver sulfADIAZINE (SILVADENE) 1 % cream Apply topically daily    vitamin D (cholecalciferol) 25 MCG (1000 UT) tablet Take 25 mcg by mouth daily       Review of Systems:   General ROS:   A comprehensive review of systems was negative except for the underlying highlighted in Bold   Constitutional: chills, weight loss, malaise/fatigue and diaphoresis.   HENT: hearing loss, ear pain, nosebleeds, congestion, sore throat, neck pain, tinnitus and ear discharge.   Eyes: blurred vision, double vision, photophobia, pain, discharge and redness.   Respiratory: cough, hemoptysis, sputum production, shortness of breath, wheezing and stridor.   Cardiovascular: chest pain, palpitations, orthopnea, claudication, leg swelling and PND.   Gastrointestinal: heartburn, nausea, vomiting, abdominal pain, diarrhea, constipation, blood in stool and melena.   Genitourinary: dysuria, urgency, frequency, hematuria and flank pain.   Musculoskeletal: myalgias, back pain, joint pain and falls.   Skin: itching and rash.   Neurological: dizziness, tingling, tremors, sensory change, speech change, focal weakness, seizures, loss of consciousness, weakness and headaches.   Endo/Heme/Allergies: environmental allergies and polydipsia. Does not bruise/bleed easily.   Psychiatric/Behavioral: depression, suicidal ideas, hallucinations, memory loss and substance abuse. The patient is not nervous/anxious and does not have insomnia.  Vitals:    04/09/20 1800   BP:    Pulse: 70   Resp:    Temp:    SpO2:    General physical examination:   General impression: Awake and alert, she is somewhat slow to answer question  No apparent distress   Pulmonary:  Normal lung expansion.   Cardiac: 1st, and 2nd heart sound appear normal  Dermatological: No abnormal pigmentation, or rash  Eyes - sclera anicteric, left eye normal, right eye look normal  ENT: Right left ear and the nose look normal  Mouth - mucous membranes  moist  Neck - supple  Abdomen - non distended  Extremities - no pedal edema noted  Psychiatric-Normal affect  Musculoskeletal- No joint deformities.   Neurological Examination:  Mental status: Alert, and oriented X  3.  No nystagmus  Comprehend for simple commands.  Memory: Memory grossly seem intact, she could name all her children and grandchildren name  Speech: Weak but intact  Cranial nerves: PERRLA at about 3 mm.  Visual fields are intact   Extraocular movements intact  Face symmetrical, no droop.  Hearing is grossly intact  Tongue is midline   Motor examination: Normal bulk and tone  Move all 4 extremities upper and lower right and left.  She appeared to have normal motor strength in all extremities  Finger to nose with in normal limits .  Reflexes: Reflexes are within normal limits   plantar responses are neutral   Sensory Examination: Sensation is normal to touch   Gait assessment: Refer to physical therapy at baseline patient has a shuffling gait     Labs:   CBC w Diff:   Lab Results   Component Value Date    WBC 20.05 (H) 04/08/2020    HGB 11.0 (L) 04/08/2020    HCT 34.1 (L) 04/08/2020    PLT 185 04/08/2020     CMP: @BRIEFLAB (NA,K,CL,CO2,BUN,CREATININE,GLU,PROT,ALBUMIN,  CA,BILITOT,ALKPHOS,AST,ALT,GLO)@   Cardiac markers: No results found for: CKMB, MYOGLOBIN   BNP: No results found for: BNP         No results found for: LIPASE     Rads:   Radiology imaging reports reviewed.  CT Reconstruction T-spine    Result Date: 04/08/2020  HISTORY: Trauma with posttraumatic thoracic back pain. COMPARISON: None. TECHNIQUE: Small field-of-view axial reconstructions of the thoracic spine were rendered retrospectively from enhanced thoracic and abdominal CT. Coronal and sagittal reconstructions were provided. This CT study was performed using radiation dose reduction techniques including one or more of the following: automated exposure control, adjustment of the mA and/or kV according to patient size, and the use of  iterative reconstruction technique. FINDINGS: The thoracic spine is normally aligned on both the coronal and sagittal reconstructions. There is a mild, age-indeterminate anterior wedge compression fracture at T7 with downward concavity the superior endplate and roughly 30% vertebral body height loss anteriorly. There is no posterior vertebral body height loss or bony retropulsion. The vertebral body heights are otherwise well maintained. The facets are well aligned bilaterally. The central canal contents are unremarkable within the limits of this modality. Diffuse discogenic degeneration is present without obvious canal or foraminal encroachment.      Mild anterior wedge compression fracture at T7 is age indeterminate, but has imaging features of chronicity. No definitive acute thoracic vertebral fracture, compression deformity or traumatic malalignment. Diffuse thoracic spondylosis. Waynard Edwards, MD  04/08/2020 6:00 PM    CT Head without  Contrast    Result Date: 04/08/2020  HISTORY: Trauma with head injury. COMPARISON: None. TECHNIQUE: Serial axial images were obtained from the skull base to the vertex without intravenous contrast. This CT study was performed using radiation dose reduction techniques including one or more of the following: automated exposure control, adjustment of the mA and/or kV according to patient size, and the use of iterative reconstruction technique. FINDINGS: There is diffuse soft tissue swelling with subcutaneous hematoma formation over the left parietal and occipital scalp. The calvarium is intact. The ventricles and sulci are prominent, consistent with moderate generalized cerebral parenchymal volume loss. The ventricles are symmetric and the basal cisterns are patent. Moderate periventricular, deep and subcortical white matter hypodensities are nonspecific but statistically reflect the sequela of chronic small vessel ischemic change. No intracranial hemorrhage, extra-axial fluid  collection, midline shift or mass-effect is evident. No  evidence of acute large vessel or territorial ischemia. Vascular calcifications indicate intracranial atherosclerosis. The visualized paranasal sinuses and mastoid air cells are clear. There are bilateral lens implants present from prior cataract extractions. The orbital contents are unremarkable.      1. No CT evidence of acute intracranial abnormality. 2. Cerebral volume loss, intracranial atherosclerosis and moderate sequela of chronic small vessel ischemic disease. Waynard Edwards, MD  04/08/2020 5:03 PM    CT Angiogram Chest    Result Date: 04/08/2020  PROCEDURE:  CT ANGIOGRAM CHEST, CT ABDOMEN PELVIS W IV/ WO PO CONT HISTORY:  Trauma TECHNIQUE: CT of the chest was performed. MIPS images were obtained. CT of the abdomen and pelvis was performed using non-ionic intravenous contrast.  Omnipaque 350 100 cc IV given. Oral contrast was not administered. COMPARISON:  None FINDINGS CT CHEST:  Support devices: None. Lower cervical region: Unremarkable. Lymph nodes: There is no thoracic adenopathy. Pleural spaces/diaphragm:Trace left pleural effusion. Heart/Pericardium/Great Vessels: Heart is normal in size. No pericardial effusion. Mild coronary calcifications. Aorta is normal in size. Atheromatous disease of the aorta. Main pulmonary artery is normal in caliber. No main, right left, or lobar pulmonary embolism. Lungs: No endobronchial lesion. Biapical pleural-parenchymal thickening. Bibasilar subsegmental atelectasis. There is patchy right lower lobe groundglass opacity. Upper GI tract: The esophagus is grossly unremarkable. Body wall:Chest wall soft tissues are unremarkable. FINDINGS CT ABDOMEN/PELVIS: Liver: Multiple subcentimeter hypodense lesions throughout the liver too small to characterize. Biliary tree: The patient is post cholecystectomy. Mild extrahepatic biliary ductal dilatation with distal tapering likely secondary to postcholecystectomy state. Spleen:  The spleen is normal in size. Pancreas:  Atrophic Adrenal glands:  The adrenal glands are normal in size and shape. Kidneys:  There are bilateral symmetric nephrograms without hydronephrosis. Small low density foci in the kidneys are too small to characterize. Lymph nodes: Abdomen: There is no abdominal adenopathy. Pelvis: There is no pelvic adenopathy. Vasculature:  There is no abdominal aortic aneurysm. Atherosclerotic calcification is seen. Peritoneum/mesentery/omentum:  There is no free fluid or free air. GI tract:  There is no bowel obstruction. Pelvic urogenital structures: Bladder is normal. Uterus is present. Body wall:  Abdominal wall soft tissues are unremarkable Bones: Degenerative changes of the spine. Anterior wedge compression deformity of the T7 vertebral body. Please see separate report for further evaluation of the thoracic and lumbar spine     COMBINED   No definite acute pathology noted within the chest abdomen or pelvis. Groundglass opacities within the right lower lobe could represent infection. Trace left pleural effusion. Jimmye Norman, MD  04/08/2020 6:21 PM    CT Cervical Spine WO Contrast    Result Date: 04/08/2020  HISTORY: Trauma with posttraumatic cervical tenderness. COMPARISON: None. TECHNIQUE: Unenhanced axial images were acquired through the cervical spine. Coronal and sagittal reconstructions were provided. This CT study was performed using radiation dose reduction techniques including one or more of the following: automated exposure control, adjustment of the mA and/or kV according to patient size, and the use of iterative reconstruction technique. FINDINGS: There is grade 1 anterolisthesis at C3-4, C4-5, C7-T1 and T1-T2 due to facet arthrosis with subluxation. The cervical spine is otherwise anatomic in alignment on both coronal and sagittal reconstructions. The relationship of the dens to the ring of C1 is normal and the facet joints are well aligned bilaterally. No acute  fracture is identified. No prevertebral soft tissue swelling is evident. Intrinsic discogenic degenerative changes are most notable at C5-6 and C6-7. Hypertrophic facet arthrosis is most notable  on the left at C3-4, C4-5 and T1-T2. A coarse calcification within the posterior aspect of the right parotid gland is incidental in nature. Only trace intimal calcification is noted at each carotid bulb/bifurcation. The thyroid gland is atrophic. There is a partially calcified pleural parenchymal scarring at the left lung apex.      No acute cervical spine fracture or traumatic malalignment. Cervical spondylosis and facet arthrosis with degenerative spondylolisthesis as described. Waynard Edwards, MD  04/08/2020 5:48 PM    CT Abdomen Pelvis W IV/ WO PO Cont    Result Date: 04/08/2020  PROCEDURE:  CT ANGIOGRAM CHEST, CT ABDOMEN PELVIS W IV/ WO PO CONT HISTORY:  Trauma TECHNIQUE: CT of the chest was performed. MIPS images were obtained. CT of the abdomen and pelvis was performed using non-ionic intravenous contrast.  Omnipaque 350 100 cc IV given. Oral contrast was not administered. COMPARISON:  None FINDINGS CT CHEST:  Support devices: None. Lower cervical region: Unremarkable. Lymph nodes: There is no thoracic adenopathy. Pleural spaces/diaphragm:Trace left pleural effusion. Heart/Pericardium/Great Vessels: Heart is normal in size. No pericardial effusion. Mild coronary calcifications. Aorta is normal in size. Atheromatous disease of the aorta. Main pulmonary artery is normal in caliber. No main, right left, or lobar pulmonary embolism. Lungs: No endobronchial lesion. Biapical pleural-parenchymal thickening. Bibasilar subsegmental atelectasis. There is patchy right lower lobe groundglass opacity. Upper GI tract: The esophagus is grossly unremarkable. Body wall:Chest wall soft tissues are unremarkable. FINDINGS CT ABDOMEN/PELVIS: Liver: Multiple subcentimeter hypodense lesions throughout the liver too small to characterize.  Biliary tree: The patient is post cholecystectomy. Mild extrahepatic biliary ductal dilatation with distal tapering likely secondary to postcholecystectomy state. Spleen: The spleen is normal in size. Pancreas:  Atrophic Adrenal glands:  The adrenal glands are normal in size and shape. Kidneys:  There are bilateral symmetric nephrograms without hydronephrosis. Small low density foci in the kidneys are too small to characterize. Lymph nodes: Abdomen: There is no abdominal adenopathy. Pelvis: There is no pelvic adenopathy. Vasculature:  There is no abdominal aortic aneurysm. Atherosclerotic calcification is seen. Peritoneum/mesentery/omentum:  There is no free fluid or free air. GI tract:  There is no bowel obstruction. Pelvic urogenital structures: Bladder is normal. Uterus is present. Body wall:  Abdominal wall soft tissues are unremarkable Bones: Degenerative changes of the spine. Anterior wedge compression deformity of the T7 vertebral body. Please see separate report for further evaluation of the thoracic and lumbar spine     COMBINED   No definite acute pathology noted within the chest abdomen or pelvis. Groundglass opacities within the right lower lobe could represent infection. Trace left pleural effusion. Jimmye Norman, MD  04/08/2020 6:21 PM     Chest AP Portable    Result Date: 04/08/2020  History: Fall. Trauma Comparison none Technique AP portable semierect FINDINGS: Moderate rotation to the right. No edema consolidation effusion or pneumothorax. Suspected left basilar atelectasis.Marland Kitchen Heart size upper normal in size. Atherosclerotic changes, ectasia with the aortic arch well defined. There is biapical pleural thickening likely scarring The bones are osteopenic. There is no displaced rib fracture. Degenerative joint changes both shoulders.     . . Mild left basilar atelectasis Marty Heck, MD  04/08/2020 4:44 PM    CT Reconstruction L-spine    Result Date: 04/08/2020  HISTORY: Trauma with posttraumatic low  back pain. COMPARISON: None. TECHNIQUE: Small field-of-view axial reconstructions of the lumbar spine were rendered retrospectively from an enhanced abdominal and pelvic CT. Coronal and sagittal reconstructions were provided. This CT study  was performed using radiation dose reduction techniques including one or more of the following: automated exposure control, adjustment of the mA and/or kV according to patient size, and the use of iterative reconstruction technique. FINDINGS: There is prominent S-shaped lumbar scoliosis, convex toward the right at the L2-L3 level with roughly 20 degrees of angulation and convex toward the left at the L4-L5 level with roughly 25 degrees of angulation. There is right lateral listhesis of L2 on L3 and L3 on L4 with left lateral listhesis of L4 on L5. There is grade 1 anterolisthesis of L5 on S1. The vertebral body heights are maintained. The facets are well aligned bilaterally. The central canal contents are unremarkable within the limits of this modality. Intrinsic disc degeneration is present at every level with vacuum phenomenon. Disc space height loss, endplate irregularities, sclerosis and circumferential disc osteophyte formation is most notable at L3-4 and L4-5. There is bilateral hypertrophic facet arthrosis and spurring at many levels. Symmetric degenerative changes are present along the sacroiliac joints including vacuum phenomenon, marginal sclerosis and ventral disc osteophyte formation.      No acute lumbar vertebral fracture, compression deformity or traumatic malalignment. Degenerative spondylosis and facet arthrosis superimposed upon S-shaped rotatory scoliosis, multilevel lateral listhesis and L5-S1 anterolisthesis. Waynard Edwards, MD  04/08/2020 5:52 PM    Assessment/plans   87 year old female was sent from [facility] yesterday after she had a syncopal episode.  Patient informs she remembers feeling nauseous and was walking to the dinner to ask for help but passed  out before that.  While in the ER she had a episode of tonic-clonic seizure as described by her daughter.  Patient has history of traumatic brain bleed in [place] about a year ago.  She also has history of normal pressure hydrocephalus  New onset seizure  Syncopal episode  Fall with scalp abrasion  Started on Keppra 500 twice daily   Possible aspiration pneumonia-possibly related to seizures  Closed head injury after a fall  CT head shows no evidence of acute intracranial abnormality  Mild anterior wedge compression fracture of T7  Hyperlipidemia-continue Lipitor  Hypothyroidism-continue Synthroid  History of rheumatoid arthritis-on methotrexate weekly  History of osteoporosis-continue Os-Cal and vitamin D  Patient has some memory impairments also bladder incontinence with shuffling gait features of normal pressure hydrocephalus. Patient and daughter are very well aware of this condition and apparently in the past has not seek any surgical intervention  MRI of head will be obtained  I have discussed case with the hospitalist attending and have recommended antiseizure medication Keppra.  Occupational and physical therapy assistance  Reviewed notes and agree with present plans          Signed by: Everlean Patterson, MD, MD

## 2020-04-09 NOTE — Plan of Care (Signed)
MRI of the brain scheduled for 1900H tonight per Elijah Birk of MRI @ [phone].  Patient and daughter aware.

## 2020-04-09 NOTE — H&P (Signed)
[facility] HOSPITALIST  H&P    Patient Info:   Date/Time: 04/09/2020 / 4:56 AM   Admit Date:04/08/2020  Patient Name:Yolanda Cox   ZOX:[id number]   PCP: Shelbie Proctor, MD  Attending Physician:Dabhiya, Mariane Duval, MD     Assessment and Plan:     Altered mental status status post fall: patient was brought in by EMS status post fall and head injury at the [facility]. Per EMR, patient was walking at the retirement community that she is a resident of when she fell forward striking her forehead on the floor, patient had been reportedly nauseated prior to the fall. Per EMR, patient had a change in her mental status while in route to the hospital. At this time patient has a flat affect and very delayed responses, patient is alert to self and place. Patient reports of a headache and points to the back of her head and reports of a back ache, unable to provide detailed history at this time.   CT Head without Contrast: 1. No CT evidence of acute intracranial abnormality. 2. Cerebral volume loss, intracranial atherosclerosis and moderate sequela of chronic small vessel ischemic disease.   CT Angiogram Chest and CT Abdomen Pelvis W IV/ WO PO Cont : Combined.No definite acute pathology noted within the chest abdomen or pelvis. Groundglass opacities within the right lower lobe could represent infection. Trace left pleural effusion.   Chest AP Portable: Mild left basilar atelectasis.    CT Reconstruction T-spine: Mild anterior wedge compression fracture at T7 is age indeterminate, but has imaging features of chronicity. No definitive acute thoracic vertebral fracture, compression deformity or traumatic malalignment. Diffuse thoracic spondylosis.   CT Reconstruction L-spine: No acute lumbar vertebral fracture, compression deformity or traumatic malalignment. Degenerative spondylosis and facet arthrosis superimposed upon S-shaped rotatory scoliosis, multilevel lateral listhesis and L5-S1 anterolisthesis.    CT Cervical Spine WO Contrast: No acute cervical spine fracture or traumatic malalignment. Cervical spondylosis and facet arthrosis with degenerative spondylolisthesis as described.   EKG: normal sinus rhythm.   No alcohol detected in blood toxicology  Negative urine drug screen   Awaiting results for SARS-CoV-2 and rapid influenza A/B antigens  Keep n.p.o.  Neurochecks every 4  Continuous falls precautions and aspiration precautions  Continuous telemetry monitoring  Pain control as needed  Frequent reorientation  Obtain PT, OT, SLP, eval and treat  Dr. Laurell Roof has consulted inpatient trauma services, please see their note and recommendations    Seizure-like activity: prolactin of 40.2, seizure like activity has been reported in the ED. patient has received 500 mg of Keppra in the ED. We will obtain an EEG.  Continue seizure precautions. Inpatient neurology consult in the a.m., Dr. Sabino Niemann.     Possible pneumonia secondary to infectious organism with leukemoid reaction: white blood count of 20.05 on admission. Afebrile. Chest x-ray: mild left basilar atelectasis. CT angiogram chest: Groundglass opacities within the right lower lobe could represent infection. Trace left pleural effusion.  IV antibiotics with Levaquin and Cleocin as scheduled, as well as probiotic. Blood cultures pending, follow cultures.     Normocytic anemia: Hemoglobin of 11 and hematocrit of 34.1. Hemoglobin is stable. No signs of active bleeding at this time. We will repeat CBC with a.m. labs and continue to follow trends.     Hyperglycemia: Blood glucose of 217 on admission. Continue to monitor blood glucose levels.     Hyperlipidemia: Chronic. On outpatient Lipitor, we will continue this. Lipid panel with a.m. labs.  Hypothyroidism: Chronic. On outpatient Synthroid, we will continue this. TSH with a.m. labs.     Rheumatoid arthritis: Chronic. On outpatient methotrexate weekly.     Osteoporosis: Chronic. On outpatient Os-Cal and vitamin  D.     Hospital Problems:  Principal Problem:    Fall, initial encounter      I discussed diagnosis, plan of care and treatment plan. Patient is in agreement of treatment plan as above    DVT Prophylaxis:SEDs   Code Status: Full Code  Disposition:home  Type of Admission:Observation  Estimated Length of Stay (including stay in the ER receiving treatment): Less than 2 midnights  Foley and Central Lines: None  Medical Necessity for stay: IV antibiotics, continuous telemetry monitoring, inpatient neurology consult, inpatient trauma consult  Milestones: See above    Clinical Presentation:   History of Presenting Illness:   Yolanda Cox is a 87 y.o. female who has history of   Past Surgical History:   Procedure Laterality Date    CHOLECYSTECTOMY        Past Medical History:   Diagnosis Date    Anemia     Fibromyalgia     Hyperlipidemia     hypothyroid     Osteoporosis     Rheumatoid     TIA (transient ischemic attack)     came with the chief complaint of Trauma and Head Injury    This is an 87 year old female with above-mentioned past medical and surgical history that was was brought in by EMS status post fall and head injury at the [employer] retirement community. Per EMR, patient was walking at the retirement community that she is a resident of when she fell forward striking her forehead on the floor, patient had been reportedly nauseated prior to the fall. Per EMR, patient had a change in her mental status while in route to the hospital. At this time patient has a flat affect and very delayed responses, patient is alert to self and place. Patient reports of a headache and points to the back of her head and reports of a back ache, unable to provide detailed history at this time. History has been obtained from EMR.     Review of Systems:   A comprehensive review of systems was negative except for the underlying highlighted in Bold     Constitutional: chills, weight loss, malaise/fatigue and  diaphoresis.   HENT: hearing loss, ear pain, nosebleeds, congestion, sore throat, neck pain, tinnitus and ear discharge.    Eyes: blurred vision, double vision, photophobia, pain, discharge and redness.   Respiratory: cough, hemoptysis, sputum production, shortness of breath, wheezing and stridor.    Cardiovascular: chest pain, palpitations, orthopnea, claudication, leg swelling and PND.   Gastrointestinal: heartburn, nausea, vomiting, abdominal pain, diarrhea, constipation, blood in stool and melena.   Genitourinary: dysuria, urgency, frequency, hematuria and flank pain.   Musculoskeletal: myalgias, back pain, joint pain and falls.   Skin: itching and rash.   Neurological: dizziness, tingling, tremors, sensory change, speech change, focal weakness, seizures, loss of consciousness, weakness and headaches.   Endo/Heme/Allergies: environmental allergies and polydipsia. Does not bruise/bleed easily.   Psychiatric/Behavioral: depression, suicidal ideas, hallucinations, memory loss and substance abuse. The patient is not nervous/anxious and does not have insomnia.      Physical Exam:     Vitals:    04/09/20 0118 04/09/20 0302 04/09/20 0328 04/09/20 0430   BP: 139/57 140/62  145/58   Pulse: 74 74 72 71   Resp: 19 19  18 22   Temp:   97.4 F (36.3 C)    TempSrc:   Temporal    SpO2: 94% 95% 94% 95%   Weight:       Height:           Constitutional: Patient appears well-developed and well-nourished.   Head: Normocephalic and atraumatic. Small hematoma and dried blood to the back of the head.   Eyes- pupils are equal round and sluggish to light, extraocular eye movements intact, sclera anicteric.   Ears - external ear canals normal, right ear normal, left ear normal  Nose - normal and patent, no erythema, discharge or polyps and normal nontender sinuses  Mouth - mucous membranes moist, pharynx normal without lesions  Neck: Normal range of motion. Neck supple. No JVD present. No bruit or thrill noted on bilateral carotids. No  tracheal deviation present. No thyromegaly present.   Cardiovascular: Normal rate, regular rhythm, normal heart sounds and intact distal pulses.  Exam reveals no gallop and no friction rub. No murmur heard.  Pulmonary/Chest: Effort normal and breath sounds normal. No stridor. No respiratory distress. Patient has no wheezes. No crackles were present.  Exhibits no tenderness on palpation.   Abdominal: Soft. Bowel sounds are normal. Patient exhibits no distension and no mass was palpable. There is no tenderness. There is no rebound and no guarding.   Musculoskeletal: Normal range of motion. Patient exhibits no edema and no tenderness.   Lymphadenopathy:  Patient has no cervical adenopathy.   Neurological: Patient is alert to self and oriented to place only. No cranial nerve deficit.  Normal muscle tone. Coordination normal. Patient able to follow simple commands. Flat affect and delayed speech.   Skin: Skin is warm. No rash noted. Patient is not diaphoretic. No erythema. No pallor.   Psychiatric: Has normal mood and affect. Behavior is normal. Judgment and thought content normal    Physical Exam  Clinical Information and History:   Chief Complaint:  Chief Complaint   Patient presents with    Trauma    Head Injury     Past Medical History:  Past Medical History:   Diagnosis Date    Anemia     Fibromyalgia     Hyperlipidemia     hypothyroid     Osteoporosis     Rheumatoid     TIA (transient ischemic attack)      Past Surgical History:  Past Surgical History:   Procedure Laterality Date    CHOLECYSTECTOMY       Family History:History reviewed. No pertinent family history.     Social History:  Social History     Substance and Sexual Activity   Alcohol Use Not Currently     Social History     Substance and Sexual Activity   Drug Use Never     Social History     Tobacco Use   Smoking Status Former Smoker   Smokeless Tobacco Not on file     Social History     Socioeconomic History    Marital status: Widowed      Spouse name: None    Number of children: None    Years of education: None    Highest education level: None   Occupational History    None   Tobacco Use    Smoking status: Former Smoker    Smokeless tobacco: None   Substance and Sexual Activity    Alcohol use: Not Currently    Drug use: Never  Sexual activity: None   Other Topics Concern    None   Social History Narrative    None     Social Determinants of Health     Financial Resource Strain:     Difficulty of Paying Living Expenses: Not on file   Food Insecurity:     Worried About Programme researcher, broadcasting/film/video in the Last Year: Not on file    [employer] of Food in the Last Year: Not on file   Transportation Needs:     Lack of Transportation (Medical): Not on file    Lack of Transportation (Non-Medical): Not on file   Physical Activity:     Days of Exercise per Week: Not on file    Minutes of Exercise per Session: Not on file   Stress:     Feeling of Stress : Not on file   Social Connections:     Frequency of Communication with Friends and Family: Not on file    Frequency of Social Gatherings with Friends and Family: Not on file    Attends Religious Services: Not on file    Active Member of Clubs or Organizations: Not on file    Attends Banker Meetings: Not on file    Marital Status: Not on file   Intimate Partner Violence:     Fear of Current or Ex-Partner: Not on file    Emotionally Abused: Not on file    Physically Abused: Not on file    Sexually Abused: Not on file   Housing Stability:     Unable to Pay for Housing in the Last Year: Not on file    Number of Places Lived in the Last Year: Not on file    Unstable Housing in the Last Year: Not on file     Allergies:  Allergies   Allergen Reactions    Augmentin [Amoxicillin-Pot Clavulanate] Rash    Biaxin [Clarithromycin] Rash    Zithromax [Azithromycin] Rash     Medications:(Not in a hospital admission)    Results of Labs/imaging   Labs have been reviewed:   Coagulation  Profile:   Recent Labs   Lab 04/08/20  1639   PT 13.4*   PT INR 1.1   PTT 36       CBC review:   Recent Labs   Lab 04/08/20  1639   WBC 20.05*   Hgb 11.0*   Hematocrit 34.1*   Platelets 185   MCV 94.2   RDW 16*     Chem Review:    Results     Procedure Component Value Units Date/Time    Prolactin [[id number]]  (Abnormal) Collected: 04/08/20 1639    Specimen: Blood Updated: 04/09/20 0125     Prolactin 40.2 ng/mL     Type and Screen [[id number]] Collected: 04/08/20 1639    Specimen: Blood Updated: 04/08/20 2303     ABO Rh A POS     AB Screen Gel POS    Urine Rapid Drug Screen [[id number]] Collected: 04/08/20 2112    Specimen: Urine Updated: 04/08/20 2128     Urine Amphetamine Screen Negative     Barbiturate Screen, UR Negative     Benzodiazepine Screen, UR Negative     Cannabinoid Screen, UR Negative     Cocaine, UR Negative     Opiate Screen, UR Negative     PCP Screen, UR Negative    Antibody Screen - Tube Method [[id number]] Collected: 04/08/20 1639     Updated: 04/08/20  2010     Antibody Screen Tube NEG    Glucose Whole Blood - POCT [[id number]]  (Abnormal) Collected: 04/08/20 2005     Updated: 04/08/20 2007     Whole Blood Glucose POCT 217 mg/dL     ABO/Rh [[id number]] Collected: 04/08/20 1733    Specimen: Blood Updated: 04/08/20 1852     ABO Rh A POS    CBC without differential [[id number]]  (Abnormal) Collected: 04/08/20 1639    Specimen: Blood Updated: 04/08/20 1836     WBC 20.05 x10 3/uL      Hgb 11.0 g/dL      Hematocrit 08.6 %      Platelets 185 x10 3/uL      RBC 3.62 x10 6/uL      MCV 94.2 fL      MCH 30.4 pg      MCHC 32.3 g/dL      RDW 16 %      MPV 10.6 fL      Nucleated RBC 0.1 /100 WBC      Absolute NRBC 0.02 x10 3/uL     PT/APTT [[id number]]  (Abnormal) Collected: 04/08/20 1639     Updated: 04/08/20 1702     PT 13.4 sec      PT INR 1.1     PTT 36 sec     Ethanol (Alcohol) Level [[id number]] Collected: 04/08/20 1639    Specimen: Blood Updated: 04/08/20 1700     Alcohol None Detected mg/dL         Radiology  reports have been reviewed:  Radiology Results (24 Hour)     Procedure Component Value Units Date/Time    CT Angiogram Chest [[id number]] Collected: 04/08/20 1809    Order Status: Completed Updated: 04/08/20 1823    Narrative:      PROCEDURE:  CT ANGIOGRAM CHEST, CT ABDOMEN PELVIS W IV/ WO PO  CONT    HISTORY:  Trauma     TECHNIQUE: CT of the chest was performed. MIPS images were  obtained. CT of the abdomen and pelvis was performed using non-ionic  intravenous contrast.  Omnipaque 350 100 cc IV given. Oral contrast was  not administered.    COMPARISON:  None      FINDINGS CT CHEST:     Support devices: None.    Lower cervical region: Unremarkable.    Lymph nodes: There is no thoracic adenopathy.    Pleural spaces/diaphragm:Trace left pleural effusion.    Heart/Pericardium/Great Vessels: Heart is normal in size. No  pericardial effusion. Mild coronary calcifications.    Aorta is normal in size. Atheromatous disease of the aorta. Main  pulmonary artery is normal in caliber. No main, right left, or lobar  pulmonary embolism.    Lungs: No endobronchial lesion. Biapical pleural-parenchymal  thickening. Bibasilar subsegmental atelectasis. There is patchy right  lower lobe groundglass opacity.    Upper GI tract: The esophagus is grossly unremarkable.    Body wall:Chest wall soft tissues are unremarkable.      FINDINGS CT ABDOMEN/PELVIS:    Liver: Multiple subcentimeter hypodense lesions throughout the liver  too small to characterize.    Biliary tree: The patient is post cholecystectomy. Mild  extrahepatic biliary ductal dilatation with distal tapering likely  secondary to postcholecystectomy state.    Spleen: The spleen is normal in size.     Pancreas:  Atrophic    Adrenal glands:  The adrenal glands are normal in size and shape.    Kidneys:  There are bilateral symmetric nephrograms  without  hydronephrosis. Small low density foci in the kidneys are too small to  characterize.    Lymph nodes:   Abdomen: There is no  abdominal adenopathy.    Pelvis: There is no pelvic adenopathy.    Vasculature:  There is no abdominal aortic aneurysm. Atherosclerotic  calcification is seen.    Peritoneum/mesentery/omentum:  There is no free fluid or free air.    GI tract:  There is no bowel obstruction.    Pelvic urogenital structures: Bladder is normal. Uterus is present.    Body wall:  Abdominal wall soft tissues are unremarkable    Bones: Degenerative changes of the spine. Anterior wedge  compression deformity of the T7 vertebral body. Please see separate  report for further evaluation of the thoracic and lumbar spine      Impression:      COMBINED       No definite acute pathology noted within the chest abdomen or pelvis.    Groundglass opacities within the right lower lobe could represent  infection.    Trace left pleural effusion.    Jimmye Norman, MD   04/08/2020 6:21 PM    CT Abdomen Pelvis W IV/ WO PO Cont [[id number]] Collected: 04/08/20 1809    Order Status: Completed Updated: 04/08/20 1823    Narrative:      PROCEDURE:  CT ANGIOGRAM CHEST, CT ABDOMEN PELVIS W IV/ WO PO  CONT    HISTORY:  Trauma     TECHNIQUE: CT of the chest was performed. MIPS images were  obtained. CT of the abdomen and pelvis was performed using non-ionic  intravenous contrast.  Omnipaque 350 100 cc IV given. Oral contrast was  not administered.    COMPARISON:  None      FINDINGS CT CHEST:     Support devices: None.    Lower cervical region: Unremarkable.    Lymph nodes: There is no thoracic adenopathy.    Pleural spaces/diaphragm:Trace left pleural effusion.    Heart/Pericardium/Great Vessels: Heart is normal in size. No  pericardial effusion. Mild coronary calcifications.    Aorta is normal in size. Atheromatous disease of the aorta. Main  pulmonary artery is normal in caliber. No main, right left, or lobar  pulmonary embolism.    Lungs: No endobronchial lesion. Biapical pleural-parenchymal  thickening. Bibasilar subsegmental atelectasis. There is patchy  right  lower lobe groundglass opacity.    Upper GI tract: The esophagus is grossly unremarkable.    Body wall:Chest wall soft tissues are unremarkable.      FINDINGS CT ABDOMEN/PELVIS:    Liver: Multiple subcentimeter hypodense lesions throughout the liver  too small to characterize.    Biliary tree: The patient is post cholecystectomy. Mild  extrahepatic biliary ductal dilatation with distal tapering likely  secondary to postcholecystectomy state.    Spleen: The spleen is normal in size.     Pancreas:  Atrophic    Adrenal glands:  The adrenal glands are normal in size and shape.    Kidneys:  There are bilateral symmetric nephrograms without  hydronephrosis. Small low density foci in the kidneys are too small to  characterize.    Lymph nodes:   Abdomen: There is no abdominal adenopathy.    Pelvis: There is no pelvic adenopathy.    Vasculature:  There is no abdominal aortic aneurysm. Atherosclerotic  calcification is seen.    Peritoneum/mesentery/omentum:  There is no free fluid or free air.    GI tract:  There is no bowel obstruction.  Pelvic urogenital structures: Bladder is normal. Uterus is present.    Body wall:  Abdominal wall soft tissues are unremarkable    Bones: Degenerative changes of the spine. Anterior wedge  compression deformity of the T7 vertebral body. Please see separate  report for further evaluation of the thoracic and lumbar spine      Impression:      COMBINED       No definite acute pathology noted within the chest abdomen or pelvis.    Groundglass opacities within the right lower lobe could represent  infection.    Trace left pleural effusion.    Jimmye Norman, MD   04/08/2020 6:21 PM    CT Reconstruction T-spine [[id number]] Collected: 04/08/20 1754    Order Status: Completed Updated: 04/08/20 1802    Narrative:      HISTORY: Trauma with posttraumatic thoracic back pain.    COMPARISON: None.    TECHNIQUE: Small field-of-view axial reconstructions of the thoracic  spine were rendered  retrospectively from enhanced thoracic and abdominal  CT. Coronal and sagittal reconstructions were provided.    This CT study was performed using radiation dose reduction techniques  including one or more of the following: automated exposure control,  adjustment of the mA and/or kV according to patient size, and the use of  iterative reconstruction technique.    FINDINGS:    The thoracic spine is normally aligned on both the coronal and sagittal  reconstructions. There is a mild, age-indeterminate anterior wedge  compression fracture at T7 with downward concavity the superior endplate  and roughly 30% vertebral body height loss anteriorly. There is no  posterior vertebral body height loss or bony retropulsion. The vertebral  body heights are otherwise well maintained. The facets are well aligned  bilaterally. The central canal contents are unremarkable within the  limits of this modality. Diffuse discogenic degeneration is present  without obvious canal or foraminal encroachment.      Impression:           Mild anterior wedge compression fracture at T7 is age indeterminate, but  has imaging features of chronicity. No definitive acute thoracic  vertebral fracture, compression deformity or traumatic malalignment.  Diffuse thoracic spondylosis.    Waynard Edwards, MD   04/08/2020 6:00 PM    CT Reconstruction L-spine [[id number]] Collected: 04/08/20 1748    Order Status: Completed Updated: 04/08/20 1754    Narrative:      HISTORY: Trauma with posttraumatic low back pain.    COMPARISON: None.    TECHNIQUE: Small field-of-view axial reconstructions of the lumbar spine  were rendered retrospectively from an enhanced abdominal and pelvic CT.  Coronal and sagittal reconstructions were provided.    This CT study was performed using radiation dose reduction techniques  including one or more of the following: automated exposure control,  adjustment of the mA and/or kV according to patient size, and the use of  iterative  reconstruction technique.    FINDINGS:    There is prominent S-shaped lumbar scoliosis, convex toward the right at  the L2-L3 level with roughly 20 degrees of angulation and convex toward  the left at the L4-L5 level with roughly 25 degrees of angulation. There  is right lateral listhesis of L2 on L3 and L3 on L4 with left lateral  listhesis of L4 on L5. There is grade 1 anterolisthesis of L5 on S1. The  vertebral body heights are maintained. The facets are well aligned  bilaterally. The central canal contents are unremarkable within  the  limits of this modality. Intrinsic disc degeneration is present at every  level with vacuum phenomenon. Disc space height loss, endplate  irregularities, sclerosis and circumferential disc osteophyte formation  is most notable at L3-4 and L4-5. There is bilateral hypertrophic facet  arthrosis and spurring at many levels. Symmetric degenerative changes  are present along the sacroiliac joints including vacuum phenomenon,  marginal sclerosis and ventral disc osteophyte formation.      Impression:           No acute lumbar vertebral fracture, compression deformity or traumatic  malalignment. Degenerative spondylosis and facet arthrosis superimposed  upon S-shaped rotatory scoliosis, multilevel lateral listhesis and L5-S1  anterolisthesis.    Waynard Edwards, MD   04/08/2020 5:52 PM    CT Cervical Spine WO Contrast [[id number]] Collected: 04/08/20 1744    Order Status: Completed Updated: 04/08/20 1750    Narrative:      HISTORY: Trauma with posttraumatic cervical tenderness.    COMPARISON: None.    TECHNIQUE: Unenhanced axial images were acquired through the cervical  spine. Coronal and sagittal reconstructions were provided.    This CT study was performed using radiation dose reduction techniques  including one or more of the following: automated exposure control,  adjustment of the mA and/or kV according to patient size, and the use of  iterative reconstruction  technique.    FINDINGS:    There is grade 1 anterolisthesis at C3-4, C4-5, C7-T1 and T1-T2 due to  facet arthrosis with subluxation. The cervical spine is otherwise  anatomic in alignment on both coronal and sagittal reconstructions. The  relationship of the dens to the ring of C1 is normal and the facet  joints are well aligned bilaterally. No acute fracture is identified. No  prevertebral soft tissue swelling is evident. Intrinsic discogenic  degenerative changes are most notable at C5-6 and C6-7. Hypertrophic  facet arthrosis is most notable on the left at C3-4, C4-5 and T1-T2. A  coarse calcification within the posterior aspect of the right parotid  gland is incidental in nature. Only trace intimal calcification is noted  at each carotid bulb/bifurcation. The thyroid gland is atrophic. There  is a partially calcified pleural parenchymal scarring at the left lung  apex.      Impression:           No acute cervical spine fracture or traumatic malalignment. Cervical  spondylosis and facet arthrosis with degenerative spondylolisthesis as  described.    Waynard Edwards, MD   04/08/2020 5:48 PM    CT Head without  Contrast [[id number]] Collected: 04/08/20 1701    Order Status: Completed Updated: 04/08/20 1705    Narrative:      HISTORY: Trauma with head injury.    COMPARISON: None.    TECHNIQUE: Serial axial images were obtained from the skull base to the  vertex without intravenous contrast.    This CT study was performed using radiation dose reduction techniques  including one or more of the following: automated exposure control,  adjustment of the mA and/or kV according to patient size, and the use of  iterative reconstruction technique.    FINDINGS:    There is diffuse soft tissue swelling with subcutaneous hematoma  formation over the left parietal and occipital scalp. The calvarium is  intact.    The ventricles and sulci are prominent, consistent with moderate  generalized cerebral parenchymal volume loss. The  ventricles are  symmetric and the basal cisterns are patent. Moderate periventricular,  deep and subcortical  white matter hypodensities are nonspecific but  statistically reflect the sequela of chronic small vessel ischemic  change. No intracranial hemorrhage, extra-axial fluid collection,  midline shift or mass-effect is evident. No evidence of acute large  vessel or territorial ischemia. Vascular calcifications indicate  intracranial atherosclerosis.    The visualized paranasal sinuses and mastoid air cells are clear. There  are bilateral lens implants present from prior cataract extractions. The  orbital contents are unremarkable.      Impression:           1. No CT evidence of acute intracranial abnormality.  2. Cerebral volume loss, intracranial atherosclerosis and moderate  sequela of chronic small vessel ischemic disease.    Waynard Edwards, MD   04/08/2020 5:03 PM     Chest AP Portable [[id number]] Collected: 04/08/20 1642    Order Status: Completed Updated: 04/08/20 1646    Narrative:      History: Fall. Trauma    Comparison none    Technique AP portable semierect    FINDINGS: Moderate rotation to the right. No edema consolidation  effusion or pneumothorax. Suspected left basilar atelectasis.Marland Kitchen Heart  size upper normal in size. Atherosclerotic changes, ectasia with the  aortic arch well defined. There is biapical pleural thickening likely  scarring    The bones are osteopenic. There is no displaced rib fracture.  Degenerative joint changes both shoulders.      Impression:      .    Marland Kitchen Mild left basilar atelectasis    Marty Heck, MD   04/08/2020 4:44 PM        EKG: EKG reviewed     Hospitalist   Signed by:   Rebecca Eaton, FNP  04/09/2020 4:56 AM    *This note was generated by the Epic EMR system/ Dragon speech recognition and may contain inherent errors or omissions not intended by the user. Grammatical errors, random word insertions, deletions, pronoun errors and incomplete sentences are occasional consequences of  this technology due to software limitations. Not all errors are caught or corrected. If there are questions or concerns about the content of this note or information contained within the body of this dictation they should be addressed directly with the author for clarification

## 2020-04-09 NOTE — Progress Notes (Signed)
ACUTE CARE SURGERY/TRAUMA PROGRESS NOTE          Date Time: 04/09/20 2:41 PM  Patient Name: Yolanda Cox  Attending Physician: Deloris Ping, MD  Type of Admission: Observation     ASSESSMENT / PLAN     S/p fall with:    Closed head injury, AMS, HCT showing no evidence of acute intracranial abnormality.  -neuro checks q 4  -repeat hct for any acute change  -no changes in MS overnight  -could follow up in concussion clinic if she has persistent symptoms in a few weeks    Mild Anterior wege compression fracture of T7, age indeterminate, no acute findings.  -no tenderness on exam, likely old.    No additional acute traumatic injuries on tertiary exam     Trauma Acute Care Surgery will sign off at this point.  Please call us at [phone] or [phone] if we can be of any further assistance.     SUBJECTIVE:   awake, alert, no acute distress, no nausea or vomiting, no abdominal pain and no overnight events  MEDICATIONS:     Current Facility-Administered Medications   Medication Dose Route Frequency    aspirin  325 mg Oral Daily    atorvastatin  40 mg Oral Daily    [START ON 04/10/2020] cefTRIAXone  1 g Intravenous Q24H    clindamycin  300 mg Intravenous Q8H    folic acid  1 mg Oral Daily    levothyroxine  75 mcg Oral Daily at 0600    sertraline  50 mg Oral Daily     PHYSICAL EXAM:     Vitals:    04/09/20 1200 04/09/20 1232 04/09/20 1300 04/09/20 1400   BP: 161/68 164/68     Pulse: 69 68 69 76   Resp: 16 15     Temp: 97.6 F (36.4 C) 98.2 F (36.8 C)     TempSrc: Oral Temporal     SpO2: 98% 97%     Weight:       Height:         General appearance - alert,  and in no distress, oriented to person, place, and time and normal appearing weight  Mental status - normal mood, behavior, speech,   Head-posterior scalp hematoma  Eyes - pupils equal and reactive, extraocular eye movements intact  Ears - right ear normal, left ear normal  Nose - normal and patent, normal nontender sinuses  Mouth - mucous membranes  moist,   Neck - supple, no cervical spine tenderness,   Chest - symmetric air entry, no tachypnea, or cyanosis  Heart - normal rate, regular rhythm, normal S1, S2,   Abdomen - soft, nontender, nondistended, tolerating po  Back exam - full range of motion, no thoracic or lumbar tenderness,   Neurological - motor and sensory grossly normal bilaterally, no tremors, strength 5/5  Musculoskeletal - no joint tenderness, deformity or swelling, no muscular tenderness noted  Extremities - peripheral pulses normal, no pedal edema, no cyanosis  Skin - normal coloration and turgor, no rashes,     Patient Lines/Drains/Airways Status     Active PICC Line / CVC Line / PIV Line / Drain / Airway / Intraosseous Line / Epidural Line / ART Line / Line / Wound / Pressure Ulcer / NG/OG Tube     Name Placement date Placement time Site Days    Peripheral IV 04/08/20 18 G Left Antecubital 04/08/20  1640  Antecubital  less than 1  Peripheral IV 04/09/20 20 G Right Forearm 04/09/20  0533  Forearm  less than 1    External Urinary Catheter 04/08/20  1746  --  less than 1              Intake and Output Summary (Last 24 hours) at Date Time    Intake/Output Summary (Last 24 hours) at 04/09/2020 1441  Last data filed at 04/09/2020 1200  Gross per 24 hour   Intake 400 ml   Output 0 ml   Net 400 ml     LABS:     Results     Procedure Component Value Units Date/Time    Lipid panel [[id number]]  (Abnormal) Collected: 04/09/20 0914    Specimen: Blood Updated: 04/09/20 1415     Cholesterol 108 mg/dL      Triglycerides 78 mg/dL      HDL 29 mg/dL      LDL Calculated 63 mg/dL      VLDL Calculated 16 mg/dL      Cholesterol / HDL Ratio 3.7    CULTURE BLOOD AEROBIC AND ANAEROBIC [[id number]] Collected: 04/09/20 0914    Specimen: Blood, Venipuncture Updated: 04/09/20 1353    Narrative:      The order will result in two separate 8-39ml bottles  Please do NOT order repeat blood cultures if one has been  drawn within the last 48 hours  UNLESS concerned  for  endocarditis  AVOID BLOOD CULTURE DRAWS FROM CENTRAL LINE IF POSSIBLE  Indications:->Pneumonia  1 BLUE+1 PURPLE    CULTURE BLOOD AEROBIC AND ANAEROBIC [[id number]] Collected: 04/09/20 0914    Specimen: Blood, Venipuncture Updated: 04/09/20 1353    Narrative:      The order will result in two separate 8-51ml bottles  Please do NOT order repeat blood cultures if one has been  drawn within the last 48 hours  UNLESS concerned for  endocarditis  AVOID BLOOD CULTURE DRAWS FROM CENTRAL LINE IF POSSIBLE  Indications:->Pneumonia  1 BLUE+1 PURPLE    Troponin I [[id number]] Collected: 04/09/20 0914    Specimen: Blood Updated: 04/09/20 1043     Troponin I <0.01 ng/mL     COVID-19 (SARS-COV-2) ([facility]) [[id number]] Collected: 04/09/20 1004    Specimen: Nasopharyngeal Swab from Nasopharynx Updated: 04/09/20 1042     Purpose of COVID testing Screening     SARS-CoV-2 Specimen Source Nasopharyngeal     SARS CoV 2 Overall Result Negative    Narrative:      o Collect and clearly label specimen type:  o Upper respiratory specimen: One Nasopharyngeal Dry Swab NO  Transport Media.  o Hand deliver to laboratory ASAP  Indication for testing->Extended care facility admission to  semi private room  Screening    Rapid influenza A/B antigens [[id number]] Collected: 04/09/20 0902    Specimen: Nasopharyngeal Swab from Nasal Wash Updated: 04/09/20 1026    Narrative:      ORDER#: [id number]                                    ORDERED BY: Teresa Coombs  SOURCE: Nasal Wash                                   COLLECTED:  04/09/20 09:02  ANTIBIOTICS AT COLL.:  RECEIVED :  04/09/20 10:14  Influenza Rapid Antigen A&B                FINAL       04/09/20 10:26  04/09/20   Negative for Influenza A and B             Reference Range: Negative      TSH [[id number]] Collected: 04/09/20 0914     Updated: 04/09/20 1017     TSH 1.69 uIU/mL     Troponin I [[id number]] Collected: 04/09/20 0914    Specimen: Blood Updated: 04/09/20  0959     Troponin I 0.01 ng/mL     Magnesium [[id number]] Collected: 04/09/20 0914     Updated: 04/09/20 0956     Magnesium 2.1 mg/dL     Phosphorus [[id number]] Collected: 04/09/20 0914     Updated: 04/09/20 0956     Phosphorus 3.2 mg/dL     Acetaminophen Level [[id number]]  (Abnormal) Collected: 04/09/20 0914    Specimen: Blood Updated: 04/09/20 0956     Acetaminophen Level <7 ug/mL     Salicylate Level [[id number]]  (Abnormal) Collected: 04/09/20 0914    Specimen: Blood Updated: 04/09/20 0956     Salicylate Level <5.0 mg/dL     Comprehensive metabolic panel [[id number]]  (Abnormal) Collected: 04/09/20 0914    Specimen: Blood Updated: 04/09/20 0955     Glucose 208 mg/dL      BUN 63.8 mg/dL      Creatinine 0.8 mg/dL      Sodium 756 mEq/L      Potassium 4.0 mEq/L      Chloride 105 mEq/L      CO2 24 mEq/L      Calcium 8.9 mg/dL      Protein, Total 7.0 g/dL      Albumin 3.9 g/dL      AST (SGOT) 20 U/L      ALT 11 U/L      Alkaline Phosphatase 57 U/L      Bilirubin, Total 1.6 mg/dL      Globulin 3.1 g/dL      Albumin/Globulin Ratio 1.3     Anion Gap 9.0    GFR [[id number]] Collected: 04/09/20 0914     Updated: 04/09/20 0955     EGFR >60.0    Prolactin [[id number]]  (Abnormal) Collected: 04/08/20 1639    Specimen: Blood Updated: 04/09/20 0125     Prolactin 40.2 ng/mL     Type and Screen [[id number]] Collected: 04/08/20 1639    Specimen: Blood Updated: 04/08/20 2303     ABO Rh A POS     AB Screen Gel POS    Urine Rapid Drug Screen [[id number]] Collected: 04/08/20 2112    Specimen: Urine Updated: 04/08/20 2128     Urine Amphetamine Screen Negative     Barbiturate Screen, UR Negative     Benzodiazepine Screen, UR Negative     Cannabinoid Screen, UR Negative     Cocaine, UR Negative     Opiate Screen, UR Negative     PCP Screen, UR Negative    Antibody Screen - Tube Method [[id number]] Collected: 04/08/20 1639     Updated: 04/08/20 2010     Antibody Screen Tube NEG    Glucose Whole Blood - POCT [[id number]]  (Abnormal) Collected:  04/08/20 2005     Updated: 04/08/20 2007     Whole Blood Glucose POCT 217 mg/dL     ABO/Rh [[id number]] Collected: 04/08/20 1733    Specimen: Blood Updated:  04/08/20 1852     ABO Rh A POS    CBC without differential [[id number]]  (Abnormal) Collected: 04/08/20 1639    Specimen: Blood Updated: 04/08/20 1836     WBC 20.05 x10 3/uL      Hgb 11.0 g/dL      Hematocrit 62.1 %      Platelets 185 x10 3/uL      RBC 3.62 x10 6/uL      MCV 94.2 fL      MCH 30.4 pg      MCHC 32.3 g/dL      RDW 16 %      MPV 10.6 fL      Nucleated RBC 0.1 /100 WBC      Absolute NRBC 0.02 x10 3/uL     PT/APTT [[id number]]  (Abnormal) Collected: 04/08/20 1639     Updated: 04/08/20 1702     PT 13.4 sec      PT INR 1.1     PTT 36 sec     Ethanol (Alcohol) Level [[id number]] Collected: 04/08/20 1639    Specimen: Blood Updated: 04/08/20 1700     Alcohol None Detected mg/dL         RADS:   Radiological procedure personally reviewed:   CT Reconstruction T-spine    Result Date: 04/08/2020   Mild anterior wedge compression fracture at T7 is age indeterminate, but has imaging features of chronicity. No definitive acute thoracic vertebral fracture, compression deformity or traumatic malalignment. Diffuse thoracic spondylosis. Waynard Edwards, MD  04/08/2020 6:00 PM    CT Head without  Contrast    Result Date: 04/08/2020   1. No CT evidence of acute intracranial abnormality. 2. Cerebral volume loss, intracranial atherosclerosis and moderate sequela of chronic small vessel ischemic disease. Waynard Edwards, MD  04/08/2020 5:03 PM    CT Angiogram Chest    Result Date: 04/08/2020  COMBINED   No definite acute pathology noted within the chest abdomen or pelvis. Groundglass opacities within the right lower lobe could represent infection. Trace left pleural effusion. Jimmye Norman, MD  04/08/2020 6:21 PM    CT Cervical Spine WO Contrast    Result Date: 04/08/2020   No acute cervical spine fracture or traumatic malalignment. Cervical spondylosis and facet arthrosis with  degenerative spondylolisthesis as described. Waynard Edwards, MD  04/08/2020 5:48 PM    CT Abdomen Pelvis W IV/ WO PO Cont    Result Date: 04/08/2020  COMBINED   No definite acute pathology noted within the chest abdomen or pelvis. Groundglass opacities within the right lower lobe could represent infection. Trace left pleural effusion. Jimmye Norman, MD  04/08/2020 6:21 PM     Chest AP Portable    Result Date: 04/08/2020  . Marland Kitchen Mild left basilar atelectasis Marty Heck, MD  04/08/2020 4:44 PM    CT Reconstruction L-spine    Result Date: 04/08/2020   No acute lumbar vertebral fracture, compression deformity or traumatic malalignment. Degenerative spondylosis and facet arthrosis superimposed upon S-shaped rotatory scoliosis, multilevel lateral listhesis and L5-S1 anterolisthesis. Waynard Edwards, MD  04/08/2020 5:52 PM    EKG Results     Procedure Component Value Units Date/Time    ECG 12 Lead [[id number]] Collected: 04/08/20 1636     Updated: 04/09/20 0909     Ventricular Rate 70 BPM      Atrial Rate 70 BPM      P-R Interval 158 ms      QRS Duration 82 ms      Q-T Interval  406 ms      QTC Calculation (Bezet) 438 ms      P Axis 69 degrees      R Axis 42 degrees      T Axis 60 degrees     Narrative:      NORMAL SINUS RHYTHM  NONSPECIFIC T WAVE ABNORMALITY  ABNORMAL ECG  NO PREVIOUS ECGS AVAILABLE  Confirmed by Amado Nash MD, SUBASH ([id number]) on 04/09/2020 9:09:49 AM           I have personally reviewed the patients history and 24 hour interval events, along with vitals, labs, radiology images and nursing. So far today I have spent 35 minutes providing care for this patient excluding teaching and billable procedures, and not overlapping with any other providers.    Signed by: Karena Addison NP-C                    [facility] NP                   Ext. [phone]     Attending Attestation:   I have directly reviewed the clinical findings, labs, imaging studies and management of this patient in detail. I have  interviewed and examined the patient and agree with the documentation, assessment and plan of Yolanda Cox as recorded by Karena Addison, NP-C.      Rahil Dharia, D.O.      *Due to pandemic of coronavirus and based on hospital policies and procedures ; this document may contain, but not limited to, information based on direct patient contact, observation , telephone conversation, use of A/V devices , medical records, conversation with other treatment team members, information and physical exam by other treatment team members.  Not all errors are caught or corrected. If there are questions or concerns about the content of this note or information contained within the body of this document they should be addressed directly with the author for clarification.    *This note was generated by the Epic EMR system/ Dragon speech recognition and may contain inherent errors or omissions not intended by the user. Grammatical errors, random word insertions, deletions, pronoun errors and incomplete sentences are occasional consequences of this technology due to software limitations. Not all errors are caught or corrected. If there are questions or concerns about the content of this note or information contained within the body of this dictation they should be addressed directly with the author for clarification

## 2020-04-09 NOTE — ED Notes (Signed)
Daughter called, made note that her mom won't remember information provided so to please involve the daughter in all important conversations.     Yolanda Cox, Evalee Mutton, MD  04/09/20 [phone]

## 2020-04-09 NOTE — Plan of Care (Signed)
Problem: Moderate/High Fall Risk Score >5  Goal: Patient will remain free of falls  Outcome: Progressing  Flowsheets (Taken 04/09/2020 2113)  VH High Risk (Greater than 13):   Keep door open for better visibility   Request PT/OT therapy consult order from physician for patients with gait/mobility impairment   Use assistive devices   Use of floor mat     Problem: Safety  Goal: Patient will be free from injury during hospitalization  Outcome: Progressing  Flowsheets (Taken 04/09/2020 2113)  Patient will be free from injury during hospitalization:   Assess patient's risk for falls and implement fall prevention plan of care per policy   Provide and maintain safe environment   Use appropriate transfer methods   Ensure appropriate safety devices are available at the bedside   Include patient/ family/ care giver in decisions related to safety   Hourly rounding  Goal: Patient will be free from infection during hospitalization  Outcome: Progressing  Flowsheets (Taken 04/09/2020 2113)  Free from Infection during hospitalization:   Assess and monitor for signs and symptoms of infection   Monitor lab/diagnostic results   Monitor all insertion sites (i.e. indwelling lines, tubes, urinary catheters, and drains)     Problem: Pain  Goal: Pain at adequate level as identified by patient  Outcome: Progressing  Flowsheets (Taken 04/09/2020 2113)  Pain at adequate level as identified by patient:   Identify patient comfort function goal   Assess for risk of opioid induced respiratory depression, including snoring/sleep apnea. Alert healthcare team of risk factors identified.   Assess pain on admission, during daily assessment and/or before any "as needed" intervention(s)   Reassess pain within 30-60 minutes of any procedure/intervention, per Pain Assessment, Intervention, Reassessment (AIR) Cycle   Evaluate patient's satisfaction with pain management progress   Evaluate if patient comfort function goal is met   Offer non-pharmacological pain  management interventions     Problem: Side Effects from Pain Analgesia  Goal: Patient will experience minimal side effects of analgesic therapy  Outcome: Progressing  Flowsheets (Taken 04/09/2020 2113)  Patient will experience minimal side effects of analgesic therapy:   Monitor/assess patient's respiratory status (RR depth, effort, breath sounds)   Assess for changes in cognitive function   Prevent/manage side effects per LIP orders (i.e. nausea, vomiting, pruritus, constipation, urinary retention, etc.)   Evaluate for opioid-induced sedation with appropriate assessment tool (i.e. POSS)     Problem: Discharge Barriers  Goal: Patient will be discharged home or other facility with appropriate resources  Outcome: Progressing  Flowsheets (Taken 04/09/2020 2113)  Discharge to home or other facility with appropriate resources:   Provide appropriate patient education   Provide information on available health resources   Initiate discharge planning     Problem: Psychosocial and Spiritual Needs  Goal: Demonstrates ability to cope with hospitalization/illness  Outcome: Progressing  Flowsheets (Taken 04/09/2020 2113)  Demonstrates ability to cope with hospitalizations/illness:   Encourage verbalization of feelings/concerns/expectations   Provide quiet environment   Assist patient to identify own strengths and abilities   Encourage patient to set small goals for self   Encourage participation in diversional activity   Reinforce positive adaptation of new coping behaviors     Problem: Compromised Tissue integrity  Goal: Damaged tissue is healing and protected  Outcome: Progressing  Flowsheets (Taken 04/09/2020 2113)  Damaged tissue is healing and protected:   Monitor/assess Braden scale every shift   Provide wound care per wound care algorithm   Reposition patient every 2 hours  and as needed unless able to reposition self   Increase activity as tolerated/progressive mobility   Relieve pressure to bony prominences for patients at  moderate and high risk   Avoid shearing injuries   Keep intact skin clean and dry   Use bath wipes, not soap and water, for daily bathing   Use incontinence wipes for cleaning urine, stool and caustic drainage. Foley care as needed   Monitor external devices/tubes for correct placement to prevent pressure, friction and shearing   Encourage use of lotion/moisturizer on skin   Monitor patient's hygiene practices  Goal: Nutritional status is improving  Outcome: Progressing  Flowsheets (Taken 04/09/2020 2113)  Nutritional status is improving:   Assist patient with eating   Allow adequate time for meals   Include patient/patient care companion in decisions related to nutrition

## 2020-04-09 NOTE — Plan of Care (Addendum)
Problem: Day of Admission - Stroke  Goal: Core/Quality measure requirements - Admission  Flowsheets (Taken 04/09/2020 0800)  Core/Quality measure requirements - Admission:   Document NIH Stroke Scale on admission   Document nursing swallow/dysphagia screen on admission. If patient fails, keep patient NPO (follow your hospital protocol on swallowing screening).   VTE Prevention: Ensure anticoagulant(s) administered and/or anti-embolism stockings/devices documented as ordered   Ensure antithrombotic administered or contraindication documented by LIP         RN thoroughly assessed patient's learning abilities.    Patient Status: Patient has no complaints of pain at this time ( 0/10 ). No evidence of grimace and any other signs of pain nor any kind of discomfort.     Individualized Plan of Care for today:  Continue to monitor pain level and medicate if needed. RN will continue to monitor pain status. Patient educated about about pain management and was asked to seek assistance if necessary. Patient was notified that the RN will conduct a purposeful hourly rounding through out the entire shift to attend her needs. Hand out for pain management was given to the patient and explained on layman's term. Today's plan of care and goals were discussed with the patient, who agrees to it and was able to verbalize and demonstrate understanding of the disease process, treatment plan, medications and consequences of noncompliance. All questions and concerns were addressed.     2 staff member skin assessment completed by Alexia Freestone  CHG bath: Completed  Clinical cytogeneticist signed by patient/staff: Completed  Admission packet reviewed with patient/family: Completed  Reviewed patient's belongings and home medications: Completed   Surveillance MRSA culture (SNF/Rehab/History): N/A  Existing lines/Foley evaluated: N/A  Arm bands applied: N/A  Interpreter waiver completed: N/A      Neuro checks q4h  EEG  PT OT SLP  External cath  care

## 2020-04-10 DIAGNOSIS — E039 Hypothyroidism, unspecified: Secondary | ICD-10-CM

## 2020-04-10 DIAGNOSIS — J690 Pneumonitis due to inhalation of food and vomit: Secondary | ICD-10-CM

## 2020-04-10 DIAGNOSIS — R569 Unspecified convulsions: Principal | ICD-10-CM

## 2020-04-10 LAB — CBC
Absolute NRBC: 0.04 10*3/uL — ABNORMAL HIGH (ref 0.00–0.00)
Hematocrit: 31.4 % — ABNORMAL LOW (ref 34.7–43.7)
Hgb: 10.2 g/dL — ABNORMAL LOW (ref 11.4–14.8)
MCH: 30.7 pg (ref 25.1–33.5)
MCHC: 32.5 g/dL (ref 31.5–35.8)
MCV: 94.6 fL (ref 78.0–96.0)
MPV: 10.9 fL (ref 8.9–12.5)
Nucleated RBC: 0.2 /100 WBC — ABNORMAL HIGH (ref 0.0–0.0)
Platelets: 149 10*3/uL (ref 142–346)
RBC: 3.32 10*6/uL — ABNORMAL LOW (ref 3.90–5.10)
RDW: 16 % — ABNORMAL HIGH (ref 11–15)
WBC: 18.92 10*3/uL — ABNORMAL HIGH (ref 3.10–9.50)

## 2020-04-10 LAB — HEMOGLOBIN A1C
Average Estimated Glucose: 171.4 mg/dL
Hemoglobin A1C: 7.6 % — ABNORMAL HIGH (ref 4.6–5.9)

## 2020-04-10 LAB — BASIC METABOLIC PANEL
Anion Gap: 11 (ref 5.0–15.0)
BUN: 13 mg/dL (ref 7.0–19.0)
CO2: 22 mEq/L (ref 22–29)
Calcium: 8.8 mg/dL (ref 7.9–10.2)
Chloride: 105 mEq/L (ref 100–111)
Creatinine: 0.8 mg/dL (ref 0.6–1.0)
Glucose: 164 mg/dL — ABNORMAL HIGH (ref 70–100)
Potassium: 3.8 mEq/L (ref 3.5–5.1)
Sodium: 138 mEq/L (ref 136–145)

## 2020-04-10 LAB — GLUCOSE WHOLE BLOOD - POCT
Whole Blood Glucose POCT: 287 mg/dL — ABNORMAL HIGH (ref 70–100)
Whole Blood Glucose POCT: 194 mg/dL — ABNORMAL HIGH (ref 70–100)
Whole Blood Glucose POCT: 275 mg/dL — ABNORMAL HIGH (ref 70–100)

## 2020-04-10 LAB — GFR: EGFR: >60

## 2020-04-10 MED ORDER — GLUCAGON 1 MG IJ SOLR (WRAP)
1.0000 mg | INTRAMUSCULAR | Status: DC | PRN
Start: 2020-04-10 — End: 2020-04-10

## 2020-04-10 MED ORDER — AMLODIPINE BESYLATE 5 MG PO TABS
5.0000 mg | ORAL_TABLET | Freq: Once | ORAL | Status: AC
Start: 2020-04-10 — End: 2020-04-10
  Administered 2020-04-10: 10:00:00 5 mg via ORAL
  Filled 2020-04-10: qty 1

## 2020-04-10 MED ORDER — AMLODIPINE BESYLATE 5 MG PO TABS
5.0000 mg | ORAL_TABLET | Freq: Every day | ORAL | Status: DC
Start: 2020-04-10 — End: 2020-04-10
  Administered 2020-04-10 (×2): 5 mg via ORAL
  Filled 2020-04-10 (×2): qty 1

## 2020-04-10 MED ORDER — DEXTROSE 50 % IV SOLN
12.5000 g | INTRAVENOUS | Status: DC | PRN
Start: 2020-04-10 — End: 2020-04-10

## 2020-04-10 MED ORDER — AMLODIPINE BESYLATE 5 MG PO TABS
10.0000 mg | ORAL_TABLET | Freq: Every day | ORAL | Status: DC
Start: 2020-04-11 — End: 2020-04-11
  Administered 2020-04-11: 11:00:00 10 mg via ORAL
  Filled 2020-04-10: qty 2

## 2020-04-10 MED ORDER — GLUCOSE 40 % PO GEL
15.0000 g | ORAL | Status: DC | PRN
Start: 2020-04-10 — End: 2020-04-10

## 2020-04-10 MED ORDER — INSULIN LISPRO 100 UNIT/ML SC SOLN
1.0000 [IU] | Freq: Three times a day (TID) | SUBCUTANEOUS | Status: DC
Start: 2020-04-10 — End: 2020-04-11
  Administered 2020-04-10: 13:00:00 3 [IU] via SUBCUTANEOUS
  Administered 2020-04-11: 13:00:00 4 [IU] via SUBCUTANEOUS
  Filled 2020-04-10: qty 9
  Filled 2020-04-10: qty 12

## 2020-04-10 NOTE — Progress Notes (Signed)
Called EEG technician at [facility] about getting the EEG done on this patient. [place] Neighbor will try to get her if Lissa Hoard is not in house.

## 2020-04-10 NOTE — Progress Note - Problem Oriented Charting Notewrit (Signed)
Progress Note neurology    Yolanda Cox CSN:[id number],MRN:[id number] is a 87 y.o. female,   Chief Complaint   Patient presents with    Trauma    Head Injury     04/10/2020   Principal Problem:    Fall, initial encounter    Past Medical History:   Diagnosis Date    Anemia     Fibromyalgia     Hyperlipidemia     hypothyroid     Osteoporosis     Rheumatoid     TIA (transient ischemic attack)          Subjective:   Yolanda Cox was resting comfortably  Does not report headache nausea vomiting    Objective:   Blood pressure 146/71, pulse 69, temperature 97.6 F (36.4 C), temperature source Oral, resp. rate 18, height 1.618 m (5' 3.7"), weight 74.6 kg (164 lb 7.4 oz), SpO2 94 %.    Intake/Output Summary (Last 24 hours) at 04/10/2020 1806  Last data filed at 04/10/2020 1639  Gross per 24 hour   Intake 20 ml   Output 1400 ml   Net -1380 ml     Scheduled Meds:  Current Facility-Administered Medications   Medication Dose Route Frequency    [START ON 04/11/2020] amLODIPine  10 mg Oral Daily    aspirin  325 mg Oral Daily    atorvastatin  40 mg Oral Daily    calcium citrate-vitamin D  2 tablet Oral Daily    folic acid  1 mg Oral Daily    insulin lispro  1-5 Units Subcutaneous TID AC    lactobacillus/streptococcus  1 capsule Oral Daily    levETIRAcetam  500 mg Oral Q12H [facility]    levothyroxine  75 mcg Oral Daily at 0600    multivitamin  1 tablet Oral Daily    piperacillin-tazobactam  4.5 g Intravenous Q6H    sertraline  50 mg Oral Daily    vitamin D  25 mcg Oral Daily     Continuous Infusions:  PRN Meds:.acetaminophen, albuterol-ipratropium, butalbital-acetaminophen-caffeine, Nursing communication: Adult Hypoglycemia Treatment Algorithm **AND** dextrose **AND** dextrose **AND** glucagon (rDNA), diphenhydrAMINE, guaiFENesin, LORazepam, melatonin, methylPREDNISolone, naloxone, ondansetron  Exam  Patient was resting comfortably  No significant neurological change  Pupil round and reactive at 3-4 mm.  Extra  occular movements intact  No nystagmus,  No facial asymmetry.   Move extremities   Supple Neck  Chest wall move symmetrical  Abdomen: not distended  Extremities:No Cyanosis, edema  CBC w Diff:   Lab Results   Component Value Date    WBC 18.92 (H) 04/10/2020    HGB 10.2 (L) 04/10/2020    HCT 31.4 (L) 04/10/2020    PLT 149 04/10/2020     CMP: @BRIEFLAB (NA,K,CL,CO2,BUN,CREATININE,GLU,PROT,ALBUMIN,  CA,BILITOT,ALKPHOS,AST,ALT,GLO)@   Cardiac markers: No results found for: CKMB, MYOGLOBIN   BNP: No results found for: BNP         No results found for: LIPASE   Radiology Reports  Radiology imaging reviewed  CT Reconstruction T-spine    Result Date: 04/08/2020  HISTORY: Trauma with posttraumatic thoracic back pain. COMPARISON: None. TECHNIQUE: Small field-of-view axial reconstructions of the thoracic spine were rendered retrospectively from enhanced thoracic and abdominal CT. Coronal and sagittal reconstructions were provided. This CT study was performed using radiation dose reduction techniques including one or more of the following: automated exposure control, adjustment of the mA and/or kV according to patient size, and the use of iterative reconstruction technique. FINDINGS: The thoracic spine is normally aligned on both  the coronal and sagittal reconstructions. There is a mild, age-indeterminate anterior wedge compression fracture at T7 with downward concavity the superior endplate and roughly 30% vertebral body height loss anteriorly. There is no posterior vertebral body height loss or bony retropulsion. The vertebral body heights are otherwise well maintained. The facets are well aligned bilaterally. The central canal contents are unremarkable within the limits of this modality. Diffuse discogenic degeneration is present without obvious canal or foraminal encroachment.      Mild anterior wedge compression fracture at T7 is age indeterminate, but has imaging features of chronicity. No definitive acute thoracic vertebral  fracture, compression deformity or traumatic malalignment. Diffuse thoracic spondylosis. Waynard Edwards, MD  04/08/2020 6:00 PM    CT Head without  Contrast    Result Date: 04/08/2020  HISTORY: Trauma with head injury. COMPARISON: None. TECHNIQUE: Serial axial images were obtained from the skull base to the vertex without intravenous contrast. This CT study was performed using radiation dose reduction techniques including one or more of the following: automated exposure control, adjustment of the mA and/or kV according to patient size, and the use of iterative reconstruction technique. FINDINGS: There is diffuse soft tissue swelling with subcutaneous hematoma formation over the left parietal and occipital scalp. The calvarium is intact. The ventricles and sulci are prominent, consistent with moderate generalized cerebral parenchymal volume loss. The ventricles are symmetric and the basal cisterns are patent. Moderate periventricular, deep and subcortical white matter hypodensities are nonspecific but statistically reflect the sequela of chronic small vessel ischemic change. No intracranial hemorrhage, extra-axial fluid collection, midline shift or mass-effect is evident. No evidence of acute large vessel or territorial ischemia. Vascular calcifications indicate intracranial atherosclerosis. The visualized paranasal sinuses and mastoid air cells are clear. There are bilateral lens implants present from prior cataract extractions. The orbital contents are unremarkable.      1. No CT evidence of acute intracranial abnormality. 2. Cerebral volume loss, intracranial atherosclerosis and moderate sequela of chronic small vessel ischemic disease. Waynard Edwards, MD  04/08/2020 5:03 PM    CT Angiogram Chest    Result Date: 04/08/2020  PROCEDURE:  CT ANGIOGRAM CHEST, CT ABDOMEN PELVIS W IV/ WO PO CONT HISTORY:  Trauma TECHNIQUE: CT of the chest was performed. MIPS images were obtained. CT of the abdomen and pelvis was performed  using non-ionic intravenous contrast.  Omnipaque 350 100 cc IV given. Oral contrast was not administered. COMPARISON:  None FINDINGS CT CHEST:  Support devices: None. Lower cervical region: Unremarkable. Lymph nodes: There is no thoracic adenopathy. Pleural spaces/diaphragm:Trace left pleural effusion. Heart/Pericardium/Great Vessels: Heart is normal in size. No pericardial effusion. Mild coronary calcifications. Aorta is normal in size. Atheromatous disease of the aorta. Main pulmonary artery is normal in caliber. No main, right left, or lobar pulmonary embolism. Lungs: No endobronchial lesion. Biapical pleural-parenchymal thickening. Bibasilar subsegmental atelectasis. There is patchy right lower lobe groundglass opacity. Upper GI tract: The esophagus is grossly unremarkable. Body wall:Chest wall soft tissues are unremarkable. FINDINGS CT ABDOMEN/PELVIS: Liver: Multiple subcentimeter hypodense lesions throughout the liver too small to characterize. Biliary tree: The patient is post cholecystectomy. Mild extrahepatic biliary ductal dilatation with distal tapering likely secondary to postcholecystectomy state. Spleen: The spleen is normal in size. Pancreas:  Atrophic Adrenal glands:  The adrenal glands are normal in size and shape. Kidneys:  There are bilateral symmetric nephrograms without hydronephrosis. Small low density foci in the kidneys are too small to characterize. Lymph nodes: Abdomen: There is no abdominal adenopathy. Pelvis: There is  no pelvic adenopathy. Vasculature:  There is no abdominal aortic aneurysm. Atherosclerotic calcification is seen. Peritoneum/mesentery/omentum:  There is no free fluid or free air. GI tract:  There is no bowel obstruction. Pelvic urogenital structures: Bladder is normal. Uterus is present. Body wall:  Abdominal wall soft tissues are unremarkable Bones: Degenerative changes of the spine. Anterior wedge compression deformity of the T7 vertebral body. Please see separate report  for further evaluation of the thoracic and lumbar spine     COMBINED   No definite acute pathology noted within the chest abdomen or pelvis. Groundglass opacities within the right lower lobe could represent infection. Trace left pleural effusion. Jimmye Norman, MD  04/08/2020 6:21 PM    CT Cervical Spine WO Contrast    Result Date: 04/08/2020  HISTORY: Trauma with posttraumatic cervical tenderness. COMPARISON: None. TECHNIQUE: Unenhanced axial images were acquired through the cervical spine. Coronal and sagittal reconstructions were provided. This CT study was performed using radiation dose reduction techniques including one or more of the following: automated exposure control, adjustment of the mA and/or kV according to patient size, and the use of iterative reconstruction technique. FINDINGS: There is grade 1 anterolisthesis at C3-4, C4-5, C7-T1 and T1-T2 due to facet arthrosis with subluxation. The cervical spine is otherwise anatomic in alignment on both coronal and sagittal reconstructions. The relationship of the dens to the ring of C1 is normal and the facet joints are well aligned bilaterally. No acute fracture is identified. No prevertebral soft tissue swelling is evident. Intrinsic discogenic degenerative changes are most notable at C5-6 and C6-7. Hypertrophic facet arthrosis is most notable on the left at C3-4, C4-5 and T1-T2. A coarse calcification within the posterior aspect of the right parotid gland is incidental in nature. Only trace intimal calcification is noted at each carotid bulb/bifurcation. The thyroid gland is atrophic. There is a partially calcified pleural parenchymal scarring at the left lung apex.      No acute cervical spine fracture or traumatic malalignment. Cervical spondylosis and facet arthrosis with degenerative spondylolisthesis as described. Waynard Edwards, MD  04/08/2020 5:48 PM    MRI Brain W WO Contrast    Result Date: 04/09/2020  HISTORY: 87 year old female patient with  seizure. COMPARISON: No prior MR. Correlation made with unenhanced CT the head dated 04/08/2020. TECHNIQUE: On a 3.0 Tesla closed system, the brain was imaged utilizing multiple pulse sequences in orthogonal planes both before and after the uneventful intravenous administration of 17 cc Clariscan. Additional coronal FSE T2 and FLAIR sequences through the temporal lobes and hippocampal formations supplemented the usual series. FINDINGS: There is generalized cerebral parenchymal volume loss present with prominence of the ventricles and cortical sulci. Moderate to advanced chronic microvascular sequela is also present. No intracranial hemorrhage, midline shift or extra-axial fluid collection is demonstrated. No diffusion restriction or pathologic parenchymal enhancement is present. The medial temporal lobes and hippocampal formations are symmetric in volume, signal and appearance. No heterotopia or migrational abnormality is appreciated. The brainstem and cerebellar hemispheres are unremarkable. The expected venous and arterial flow-voids are present. Generalized soft tissue swelling of the left parietal scalp is present with subcutaneous hematoma formation, similar to that seen on the previous CT. There are bilateral lens implants present from prior cataract extractions. The orbital contents are otherwise unremarkable. The visualized paranasal sinuses and mastoid air cells are clear.      1. No MR evidence of acute intracranial abnormality, infarct, mass, hemorrhage or hydrocephalus. 2. Moderate generalized intraparenchymal volume loss with moderately advanced chronic  microvascular sequela. Waynard Edwards, MD  04/09/2020 10:15 PM    CT Abdomen Pelvis W IV/ WO PO Cont    Result Date: 04/08/2020  PROCEDURE:  CT ANGIOGRAM CHEST, CT ABDOMEN PELVIS W IV/ WO PO CONT HISTORY:  Trauma TECHNIQUE: CT of the chest was performed. MIPS images were obtained. CT of the abdomen and pelvis was performed using non-ionic intravenous  contrast.  Omnipaque 350 100 cc IV given. Oral contrast was not administered. COMPARISON:  None FINDINGS CT CHEST:  Support devices: None. Lower cervical region: Unremarkable. Lymph nodes: There is no thoracic adenopathy. Pleural spaces/diaphragm:Trace left pleural effusion. Heart/Pericardium/Great Vessels: Heart is normal in size. No pericardial effusion. Mild coronary calcifications. Aorta is normal in size. Atheromatous disease of the aorta. Main pulmonary artery is normal in caliber. No main, right left, or lobar pulmonary embolism. Lungs: No endobronchial lesion. Biapical pleural-parenchymal thickening. Bibasilar subsegmental atelectasis. There is patchy right lower lobe groundglass opacity. Upper GI tract: The esophagus is grossly unremarkable. Body wall:Chest wall soft tissues are unremarkable. FINDINGS CT ABDOMEN/PELVIS: Liver: Multiple subcentimeter hypodense lesions throughout the liver too small to characterize. Biliary tree: The patient is post cholecystectomy. Mild extrahepatic biliary ductal dilatation with distal tapering likely secondary to postcholecystectomy state. Spleen: The spleen is normal in size. Pancreas:  Atrophic Adrenal glands:  The adrenal glands are normal in size and shape. Kidneys:  There are bilateral symmetric nephrograms without hydronephrosis. Small low density foci in the kidneys are too small to characterize. Lymph nodes: Abdomen: There is no abdominal adenopathy. Pelvis: There is no pelvic adenopathy. Vasculature:  There is no abdominal aortic aneurysm. Atherosclerotic calcification is seen. Peritoneum/mesentery/omentum:  There is no free fluid or free air. GI tract:  There is no bowel obstruction. Pelvic urogenital structures: Bladder is normal. Uterus is present. Body wall:  Abdominal wall soft tissues are unremarkable Bones: Degenerative changes of the spine. Anterior wedge compression deformity of the T7 vertebral body. Please see separate report for further evaluation of  the thoracic and lumbar spine     COMBINED   No definite acute pathology noted within the chest abdomen or pelvis. Groundglass opacities within the right lower lobe could represent infection. Trace left pleural effusion. Jimmye Norman, MD  04/08/2020 6:21 PM     Chest AP Portable    Result Date: 04/08/2020  History: Fall. Trauma Comparison none Technique AP portable semierect FINDINGS: Moderate rotation to the right. No edema consolidation effusion or pneumothorax. Suspected left basilar atelectasis.Marland Kitchen Heart size upper normal in size. Atherosclerotic changes, ectasia with the aortic arch well defined. There is biapical pleural thickening likely scarring The bones are osteopenic. There is no displaced rib fracture. Degenerative joint changes both shoulders.     . . Mild left basilar atelectasis Marty Heck, MD  04/08/2020 4:44 PM    CT Reconstruction L-spine    Result Date: 04/08/2020  HISTORY: Trauma with posttraumatic low back pain. COMPARISON: None. TECHNIQUE: Small field-of-view axial reconstructions of the lumbar spine were rendered retrospectively from an enhanced abdominal and pelvic CT. Coronal and sagittal reconstructions were provided. This CT study was performed using radiation dose reduction techniques including one or more of the following: automated exposure control, adjustment of the mA and/or kV according to patient size, and the use of iterative reconstruction technique. FINDINGS: There is prominent S-shaped lumbar scoliosis, convex toward the right at the L2-L3 level with roughly 20 degrees of angulation and convex toward the left at the L4-L5 level with roughly 25 degrees of angulation. There is right  lateral listhesis of L2 on L3 and L3 on L4 with left lateral listhesis of L4 on L5. There is grade 1 anterolisthesis of L5 on S1. The vertebral body heights are maintained. The facets are well aligned bilaterally. The central canal contents are unremarkable within the limits of this modality.  Intrinsic disc degeneration is present at every level with vacuum phenomenon. Disc space height loss, endplate irregularities, sclerosis and circumferential disc osteophyte formation is most notable at L3-4 and L4-5. There is bilateral hypertrophic facet arthrosis and spurring at many levels. Symmetric degenerative changes are present along the sacroiliac joints including vacuum phenomenon, marginal sclerosis and ventral disc osteophyte formation.      No acute lumbar vertebral fracture, compression deformity or traumatic malalignment. Degenerative spondylosis and facet arthrosis superimposed upon S-shaped rotatory scoliosis, multilevel lateral listhesis and L5-S1 anterolisthesis. Waynard Edwards, MD  04/08/2020 5:52 PM    Significant Tests:  See full reports for all details   @[email or web address]   Assessment & Plan   87 year old female was sent from [facility] yesterday after she had a syncopal episode. Patient informs she remembers feeling nauseous and was walking to the dinner to ask for help but passed out before that. While in the ER she had a episode of tonic-clonic seizure as described by her daughter. Patient has history of traumatic brain bleed in [place] about a year ago. She also has history of normal pressure hydrocephalus  New onset seizure  Syncopal episode  Fall with scalp abrasion  Started on Keppra 500 twice daily   Possible aspiration pneumonia-possibly related to seizures  Closed head injury after a fall  CT head shows no evidence of acute intracranial abnormality  Mild anterior wedge compression fracture of T7  Hyperlipidemia-continue Lipitor  Hypothyroidism-continue Synthroid  History of rheumatoid arthritis-on methotrexate weekly  History of osteoporosis-continue Os-Cal and vitamin D  Patient has some memory impairments also bladder incontinence with shuffling gait features of normal pressure hydrocephalus. Patient and daughter are very well aware of this condition and apparently in the past  has not seek any surgical intervention  MRI of head will be obtained  I have discussed case with the hospitalist attending and have recommended antiseizure medication Keppra.  Occupational and physical therapy assistance  Reviewed notes and agree with present plans  04/10/2020 ,no significant neurological change  Patient resting comfortably  MRI of the head reviewed, No MR evidence of acute intracranial abnormality, infarct, mass,  hemorrhage or hydrocephalus.  Moderate generalized intraparenchymal volume loss with moderately  advanced chronic microvascular sequela.  Patient daughter was updated about MRI report  EEG was done, would review  Notes reviewed agree with plans

## 2020-04-10 NOTE — Progress Notes (Signed)
Case Management Initial Assessment and Discharge Planning:    Situation FALL     Background [place] alone in [place]. Uses walker. Independent with ADL.   PT recom acute rehab. [facility] declined since there is no bed available as of now.   Initial assessment completed after speaking with the daughte.r she requested for [facility] acute rehab since daughter lives in [place].  Verified demo, pcp and pharmacy.   Assessment AOx3     Recommendation Acute rehab, [facility].  Referral sen out

## 2020-04-10 NOTE — PT Eval Note (Signed)
[facility]  [address]  [place], [place]. [address]    Department of Rehabilitation  [phone]    Physical Therapy Evaluation    Patient: Yolanda Cox    MRN#: [id number]     Q657/Q469.A    Time of treatment: Time Calculation  PT Received On: 04/10/20  Start Time: 6295  Stop Time: 0910  Time Calculation (min): 29 min    PT Visit Number: 1    Consult received for Yolanda Cox for PT Evaluation and Treatment.  Patients medical condition is appropriate for Physical therapy intervention at this time.      Assessment:   Yolanda Cox is a 87 y.o. female admitted 04/08/2020.  Pt's functional mobility is impacted by:  decreased activity tolerance, decreased balance, gait impairment, decreased strength and decreased cognition, decreased motor planning.  There are a few comorbidities or other factors that affect plan of care and require modification of task including: assistive device needed for mobility and lives alone.  Standardized tests and exams incorporated into evaluation include AMPAC mobility.  Pt demonstrates a stable clinical presentation due to no adverse response to activity.   Pt would continue to benefit from PT to address these deficits and increase functional independence.     Complexity Level Hx and Co  morbidites Examination Clinical Decision Making Clinical Presentation   Low no impact 1-2 elements Limited options Stable   Moderate   1-2 factors 3 or more   Several options Evolving, plan may alter   High 3 or more 4 or more Multiple options Unstable, unpredictable       Impairments: Assessment: Decreased endurance/activity tolerance;Decreased functional mobility;Decreased balance;Gait impairment;Decreased cognition.     Therapy Diagnosis: generalized weakness, decreased functional mobility , decreased independence with ADL's, increased gait dysfunction and decreased endurance/ activity engagement due to fall with head injury. Without therapy interventions,  patient is at risk for falls, dependence on caregivers for mobility and dependence on caregivers for ADL's.    Rehabilitation Potential: Prognosis: Good;With continued PT status post acute discharge      Plan:    Treatment/Interventions: Gait training;Neuromuscular re-education;Functional transfer training;LE strengthening/ROM;Endurance training;Bed mobility;Cognitive reorientation PT Frequency: 3-4x/wk    Risks/Benefits/POC Discussed with Pt/Family: With patient     Goals:   Goals  Goal Formulation: With patient  Time for Goal Acheivement: By time of discharge  Goals: Select goal  Pt Will Go Supine To Sit: with supervision;to maximize functional mobility and independence;5 visits  Pt Will Perform Sit to Stand: with minimal assist;to maximize functional mobility and independence;5 visits  Pt Will Transfer Bed/Chair: with rolling walker;with minimal assist;to maximize functional mobility and independence;5 visits  Pt Will Ambulate: 31-50 feet;with rolling walker;with minimal assist;to maximize functional mobility and independence;5 visits      Discharge Recommendations:   Based on today's session patient's discharge recommendation is the following: Acute Rehab        If Discharge Recommendation: Acute Rehab is not available, then the patient will need SNF. If pt returns home, she will benefit from 24/7 supervision, assistance for all mobility and ADLs, HH PT and eventual follow up with OP concussion clinic        Precautions and Contraindications: Fall risk       Medical Diagnosis: Nausea [R11.0]  Seizure [R56.9]  Concussion without loss of consciousness, initial encounter [S06.0X0A]  Injury of head, initial encounter [S09.90XA]  Fall, initial encounter [W19.XXXA]  Leukocytosis, unspecified type [D72.829]  Pneumonia due to infectious organism, unspecified laterality, unspecified  part of lung [J18.9]    History of Present Illness: Yolanda Cox is a 87 y.o. female admitted on 04/08/2020 with fall with head  injury, seizure, post concussion symptoms.    Patient Active Problem List   Diagnosis    Fall, initial encounter        Past Medical/Surgical History:  Past Medical History:   Diagnosis Date    Anemia     Fibromyalgia     Hyperlipidemia     hypothyroid     Osteoporosis     Rheumatoid     TIA (transient ischemic attack)       Past Surgical History:   Procedure Laterality Date    CHOLECYSTECTOMY           X-Rays/Tests/Labs:  MRI Brain W WO Contrast    Result Date: 04/09/2020   1. No MR evidence of acute intracranial abnormality, infarct, mass, hemorrhage or hydrocephalus. 2. Moderate generalized intraparenchymal volume loss with moderately advanced chronic microvascular sequela. Waynard Edwards, MD  04/09/2020 10:15 PM      Social History:  Prior Level of Function  Prior level of function: Independent with ADLs,Ambulates with assistive device  Assistive Device: Front wheel walker  Driving: does not drive  Cooking: No (has meals prepared)  Home Living Arrangements  Living Arrangements: Family members  Type of Home: Apartment Yolanda Cox)  Home Layout: Elevator  Bathroom Shower/Tub: Walk-in shower  Bathroom Toilet: Raised  Bathroom Equipment: Shower chair,Grab bars in shower,Hand-held shower,Grab bars around toilet      Subjective:    Patient is agreeable to participation in the therapy session. Nursing clears patient for therapy. Pt delayed with responses throughout but alert and appropriate. C/o HA. RN aware.   Patient Goal  Patient Goal: To feel better       Objective:   Observation of Patient/Vital Signs:  Patient is in bed with telemetry and female external catheter in place.    Cognition/Neuro Status  Arousal/Alertness: Delayed responses to stimuli  Attention Span: Attends to task with redirection  Orientation Level: Oriented X4  Memory: Appears intact  Following Commands: Follows one step commands with increased time;Follows one step commands with repetition  Safety Awareness: moderate verbal  instruction  Insights: Decreased awareness of deficits  Behavior: calm;cooperative  Motor Planning: decreased processing speed;decreased initiation  Coordination: intact (slow)    Vision: saccadic eye movement during smooth pursuit when tracking R more vs L. Denies blurry/double vision  Sensation: intact light touch BLE    Gross ROM  Right Lower Extremity ROM: within functional limits  Left Lower Extremity ROM: within functional limits  Gross Strength  Right Lower Extremity Strength: 4/5  Left Lower Extremity Strength: 4/5       Functional Mobility  Supine to Sit: Moderate Assist;HOB raised, assistance for trunk elevation and LE management  Sit to Stand: Moderate Assist (Heavy R lean upon standing)  Stand to Sit: Moderate Assist  Sit to Supine: Min A     Locomotion  Ambulation: Moderate Assist (with B handheld assistance)  Pattern: decreased step length;decreased cadence (R lean with instruction to correct. Heavily pushing through RUE for stability. Small, short steps. Improved when ambulating back to bed from bathroom.)  Distance Walked (ft) (Step 6,7): 20 Feet  PMP Activity: Step 6 - Walks in Room     Balance  Balance: needs focused assessment  Sitting - Static:  (poor initially, improved as session progressed. R lean with max A to maintain initially.)  Standing - Static: Poor  Standing - Dynamic: Poor    Participation and Endurance  Participation Effort: good  Endurance: Tolerates 10 - 20 min exercise with multiple rests. C/o dizziness with all upright activity, did not worsen. No SOB Noted. No c/o nausea.    AM-PAC Inpatient Short Forms  Inpatient AM-PAC Performed? (PT): Basic Mobility Inpatient Short Form  AM-PAC "6 Clicks" Basic Mobility Inpatient Short Form  Turning Over in Bed: A lot  Sitting Down On/Standing From Armchair: A lot  Lying on Back to Sitting on Side of Bed: A lot  Assist Moving to/from Bed to Chair: A lot  Assist to Walk in Hospital Room: A lot  Assist to Climb 3-5 Steps with Railing:  Total  PT Basic Mobility Raw Score: 11  CMS 0-100% Score: 72.57%    Treatment Activities: Mobility performed as above with prolonged rest breaks as needed due to dizziness. Pt with dazed look throughout session but able to follow all commands. Required constant instruction for postural awareness and safety during ambulation bout. R lean in both sitting and standing. Encouraged to focus on something in front of her to aid in decreasing dizziness. Min A for sitting balance during voiding for safety. Min A while standing sinkside due to R lean, dec WB through LLE and need for UE support. Returned to bed and educated in AP, QS, GS and HS and encouraged to perform LE therex throughout the day to decrease effects of immobility. Educated in post concussion symptoms including HA, changes in vision, nausea and dizziness and encouraged rest, low lights/noises and proper PO intake for recovery. Pt verbalized understanding for all education provided.    Educated the patient to role of physical therapy, plan of care, goals of therapy and HEP, safety with mobility and ADLs.    At end of session pt seated upright in bed, call bell and items in reach. RN aware. Bed alarm not activating, RN aware of same.      Therapist PPE during session procedural mask, goggles  and gloves       Delfin Edis, PT, DPT  Pager #: [phone]

## 2020-04-10 NOTE — Progress Notes (Signed)
NEURODIAGNOSTIC LABORATORY  ROUTINE ELECTROENCEPHALOGRAM    Date/Time: 04/10/2020 2:56 PM  Patient Name: Yolanda Cox, Yolanda Cox  DOB: 1929-07-13  [id number]  MRN: [id number]  Study Number:     Description of the Recording:     This electroencephalogram was performed with conventional scalp electrode placement in accordance to the International 10-20 system convention. The electroencephalogram was recorded using both bipolar and referential montages          Comments:       Signed by: Fritzi Mandes

## 2020-04-10 NOTE — SLP Eval Note (Signed)
[facility]  [address]  [place], [place]. [address]    Department of Rehabilitation Services  [phone]    Speech and Language Therapy Evaluation    Patient: Yolanda Cox    MRN#: [id number]     Time of treatment:   SLP Received On: 04/10/20  Start Time: 1200  Stop Time: 1225  Time Calculation (min): 25 min    SLP Visit Number: 1    Consult received for [name] for SLP Evaluation and Treatment.      Assessment:   -Pt with at least mild dysarthria characterized by decreased rate of speech, and mildly decreased precision  -Pt with at least mild-moderately decreased cognitive/linguitci skills characterized by decreased attention/concentraiton, short term memory, and executive functioning. Relative strengths included pt's basic auditory comprehension and verbal expression skills though decreased performance as length and complexity increased.    Plan/ Recommendations:   -SLP to follow for cognitive/lingusitic dx;/tx while pt here  -Suggest acute rehab when pt ready for d/c    Goals:    COGNITIVE  1. Pt will complete functional visual attention tasks with 95% X3  2. Pt will complete funcitonal auditory attention tasks with 95% X3  3. Pt will complete divided /alternating attention tasks with 95% X3   4. Pt will recall complex auditorily presented information with 905 X3  5. Pt wil utilize targeted compensatory strategies to facilitate performance on memory and attention tasks (ie association, visual imagery, note taking, repetition, and rehearsal)  6. Pt will recall daily events with 90% X3          Expected disposition: Recommendations  Recommendations: Acute Rehab  SLP Frequency Recommended: 4-5x/wk  SLP - Next Visit Recommended: 04/11/20     Discharge Recommendations:   Recommendations: Acute Rehab      Current Hospitalization:  Referring Physician: Zetta Bills  Date of Referral: 04/10/2020    Medical Diagnosis: Nausea [R11.0]  Seizure [R56.9]  Concussion without loss of  consciousness, initial encounter [S06.0X0A]  Injury of head, initial encounter [S09.90XA]  Fall, initial encounter [W19.XXXA]  Leukocytosis, unspecified type [D72.829]  Pneumonia due to infectious organism, unspecified laterality, unspecified part of lung [J18.9]      History of Present Illness: Yolanda Cox is a 87 y.o. female admitted on 04/08/2020  with the chief complaint of Trauma and Head Injury     This is an 87 year old female with above-mentioned past medical and surgical history that was was brought in by EMS status post fall and head injury at the [employer] retirement community. Per EMR, patient was walking at the retirement community that she is a resident of when she fell forward striking her forehead on the floor, patient had been reportedly nauseated prior to the fall. Per EMR, patient had a change in her mental status while in route to the hospital. At this time patient has a flat affect and very delayed responses, patient is alert to self and place. Patient reports of a headache and points to the back of her head and reports of a back ache, unable to provide detailed history at this time. History has been obtained from EMR.     MRI-   1. No MR evidence of acute intracranial abnormality, infarct, mass,  hemorrhage or hydrocephalus.  2. Moderate generalized intraparenchymal volume loss with moderately  advanced chronic microvascular sequela.  Patient Active Problem List   Diagnosis    Fall, initial encounter        Past Medical/Surgical History:  Past Medical History:   Diagnosis Date    Anemia     Fibromyalgia     Hyperlipidemia     hypothyroid     Osteoporosis     Rheumatoid     TIA (transient ischemic attack)       Past Surgical History:   Procedure Laterality Date    CHOLECYSTECTOMY           Social History:  Prior Level of Function  Prior level of function: Independent with ADLs;Ambulates with assistive device  Assistive Device: Front wheel walker  Driving: does not  drive  Cooking: No (has meals prepared)  Home Living Arrangements  Living Arrangements: Family members  Type of Home: Apartment [place] [place]in Clancy)  Home Layout: Elevator  Bathroom Shower/Tub: Walk-in shower  Bathroom Toilet: Raised  Bathroom Equipment: Shower chair;Grab bars in shower;Hand-held shower;Grab bars around toilet    Subjective:    Family and/or guardian are agreeable to patient's participation in the therapy session. Patients medical condition is appropriate for Speech therapy intervention at this time.       Objective:   Observation of Patient/Vital Signs:  Patient is in bed with telemetry and peripheral IV in place.    As part of the cognitive/linguistic evaluation, formal and informal assessments, observation, and pt interview were utilized to assess pt's current status relative to speech, language, and cognition.    The following symptoms were self reported :     MILD:  Nausea, photophobia, noise sensitivity, memory deficits, difficulty concentrating  MODERATE: HA  SEVERE: fatigue, drowsiness, fatigue, balance, dizziness    Cognitive Status and Neuro Exam:  Cognition  Arousal/Alertness: Delayed responses to stimuli  Attention Span: Attends to task with redirection  Orientation Level: Oriented to place;Oriented to person  Memory: Decreased short term memory  Following Commands: Follows one step commands without difficulty;Follows multistep commands with increased time;Follows multistep commands with repetition  Safety Awareness: minimal verbal instruction  Insights: Decreased awareness of deficits  Problem Solving: Assistance required to identify errors made;Assistance required to generate solutions    Oral Motor Assessment:  Oral/Motor  Labial ROM: Within Functional Limits  Labial Symmetry: Within Functional Limits  Labial Strength: Within Functional Limits  Labial Sensation: Within Functional Limits  Lingual ROM: Within Functional Limits  Lingual Symmetry: Within Functional Limits  Lingual Strength:  Within Functional Limits  Lingual Sensation: Within Functional Limits  Velum: Within Functional Limits  Mandible: Within Functional Limits  Facial ROM: Within Functional Limits  Facial Symmetry: Within Functional Limits  Facial Sensation: Within Functional Limits  Vocal Quality: Within Functional Limits  Vocal Intensity: Within Functional Limits  Gag: Within Functional Limits  Apraxia: None present  Intelligibility: Intelligible  Breath Support: Adequate for speech  Dentition: Adequate  Hearing: Within Functional Limits    Auditory Comprehension:  Auditory Comprehension  Auditory Comprehension: Exceptions to [facility]  Yes/No Questions: Mild  Basic Questions: Mild  Complex Questions: Moderate  Commands: Exceptions to [facility]  One Step Basic Commands: WFL  Two Step Basic Commands: Mild  Multistep Basic Commands: Moderate  Conversation: Simple  Interfering Components: Attention - selective;Attention - sustained;Processing speed;Working Actor: Extra processing time    Reading Comprehension:  Reading Comprehension  Reading Status: Unable to assess (refused to read stating she 'just can't')    Expression:  Expression  Primary Mode of Expression: Verbal    Verbal Expression  Verbal Expression  Single Words: Moderate  Connected Speech: Exceptions to [facility]  Expressing Basic Needs: [facility]  Expressing Simple Ideas: Mild  Expressing Complex Ideas: Moderate  Initiation: Mild  Repetition: Mild  Effective Techniques: Decreased rate;Provide extra time;Word retrival strategies    Written Expression  Written Expression  Written Expression:  (Did not assess- pt stating she is 'too tired')      Behavior:  Behavior  Insight: Moderate insight  Impulsive: Within functional limits  Task Initiation: Delayed initiation  Flexibility of Thought: Reduced flexibility  Planning: Reduced planning skills  Organization: Moderately disorganized  Processing Speed: Delayed  Perseveration: Present - verbal            Educated the patient to role  of speech therapy, plan of care, goals of therapy.    Therapist PPE during session procedural mask, face shield , gown  and gloves      Doralee Albino. Margo Aye, MS CCC-SLP

## 2020-04-10 NOTE — Progress Notes (Signed)
[facility]  Progress Note  Patient Info:   Date/Time: 04/10/2020 / 8:47 AM   Admit Date:04/08/2020  Patient Name:Yolanda Cox   ZOX:[id number]   PCP: Shelbie Proctor, MD  Attending Physician:Koji Niehoff, Olen Pel, MD     Assessment and Plan:     Pt seen and examined.  Patient was sent from [facility] on 1/8 after she had a syncopal episode.  Patient informs she remembers feeling nauseous and was walking to the dinner to ask for help but passed out before that.  While in the ER she had a episode of tonic-clonic seizure as described by her daughter.  Patient has history of traumatic brain bleed in [place] about a year ago.  She also has history of normal pressure hydrocephalus    New onset seizure  Syncopal episode  Fall with scalp abrasion  -Check EEG,   -MRI with contrast shows no evidence of acute intracranial abnormality, infarct, mass, hemorrhage or hydrocephalus.  Moderate generalized intraparenchymal volume loss with moderately advanced chronic microvascular sequela.  -Appreciate neurology evaluation-discussed the case with Dr. Marney Doctor  -Seizure precautions  -Ativan as needed  -Started on Keppra 500 twice daily as per Dr. Marney Doctor    Possible aspiration pneumonia-possibly related to seizures  -Continue Zosyn  - Probiotics  - Nebs as needed  - Incentive spirometry  - F/u CXR in 4-6 weeks for resolution  - Robitussin PRN  - Speech eval - ?postconcussive symptoms     Closed head injury after a fall  -CT head shows no evidence of acute intracranial abnormality  -Continue neurochecks every 4 hours  -We will repeat CT head if she has any acute changes  -Tylenol and Fioricet as needed for headache  -Could follow-up in concussion clinic if she has persistent symptoms after few weeks    Mild anterior wedge compression fracture of T7  -Age-indeterminate, no acute findings  -No tenderness on exam    HTN  - Bp high  - Increased Norvasc to 10 mg    Diabetes type 2 - New diagnosis   HBA1C - 7.6  - Will  start SSI   - Diabetic diet    Hyperlipidemia  -Continue Lipitor    Hypothyroidism  Continue Synthroid    History of rheumatoid arthritis  -On methotrexate weekly    History of osteoporosis  -Continue Os-Cal and vitamin D    Updated daughter at the bedside    DVT Prohylaxis:SEDs   Central Line/Foley Catheter/PICC line status: none   Code Status: NO CPR  -  ALLOW NATURAL DEATH  Disposition:SNF  Type of Admission:Inpatient  Expected Date of Discharge: 1-2 days   Milestones required for discharge: Clinical improvement  Hospital Problems:   Principal Problem:    Fall, initial encounter    Subjective:   04/10/20   Patient is tired and sleeping  Daughter informs the activity in the hospital is too much for her so she is very tired and sleeps all the time  No more seizures since admission  Chief Complaint:  Trauma and Head Injury    ROS  Objective:     Vitals:    04/09/20 2345 04/10/20 0106 04/10/20 0358 04/10/20 0806   BP: 185/71 185/71 165/69 167/74   Pulse: 67  69 67   Resp: 16  15 18    Temp: 98.5 F (36.9 C)  98.2 F (36.8 C) 97.7 F (36.5 C)   TempSrc: Temporal  Temporal Oral   SpO2: 94%  92% 92%  Weight:       Height:         Physical Exam:   Physical Exam   Heart - S1, S2 heard.  No murmur  Lungs - bilateral air entry present, no wheeze or rales  Abdomen - soft, bowel sounds normal, nondistended, no tenderness  Central nervous system - no focal deficits  Musculoskeletal - no edema    Results of Labs/imaging   Labs and radiology reports have been reviewed.    Hospitalist   Signed by:   Deloris Ping, MD  04/10/2020 8:47 AM    *This note was generated by the Epic EMR system/ Dragon speech recognition and may contain inherent errors or omissions not intended by the user. Grammatical errors, random word insertions, deletions, pronoun errors and incomplete sentences are occasional consequences of this technology due to software limitations. Not all errors are caught or corrected. If there are questions or  concerns about the content of this note or information contained within the body of this dictation they should be addressed directly with the author for clarification

## 2020-04-10 NOTE — OT Eval Note (Addendum)
[facility]  [address]  [place], [place]. [address]    Department of Rehabilitation Services  [phone]    Occupational Therapy Evaluation    Patient: Yolanda Cox    MRN#: [id number]     [id number]/[id number].A    Time of treatment: Time Calculation  OT Received On: 04/10/20  Start Time: 6295  Stop Time: 0910  Time Calculation (min): 29 min  OT Visit Number: 1    Consult received for Yolanda Cox for OT Evaluation and Treatment.  Patients medical condition is appropriate for Occupational therapy intervention at this time.    Assessment:   Yolanda Cox is a 87 y.o. female admitted 04/08/2020.   Brief chart review completed including review of labs, review of imaging, review of vitals, review of H&P and physician progress notes and review of consulting physician notes .  Pt's ability to complete ADLs and functional transfers is impaired due to the following deficits:  decreased activity tolerance, decreased balance, decreased bed mobility, decreased insight, decreased judgment, decreased memory, decreased safety awareness, lethargy, decreased problem solving, decreased strength and transfers .  Pt demonstrates performance deficits with grooming, bathing, dressing, toileting and functional mobility. There are a few comorbidities or other factors that affect plan of care and require modification of task including: assistive device needed for mobility and lives alone.  Pt would continue to benefit from OT to address these deficits and increase functional independence.    Assessment: decreased strength;balance deficits;decreased independence with ADLs;decreased safety awareness;decreased attention;decreased cognition;decreased endurance/activity tolerance;decreased independence with IADLs     Complexity Chart Review Performance Deficits Clinical Decision Making Hx/Comorbidities Assistance needed   Low Brief 1-3 Limited options None None (or at baseline)   Moderate Expanded 3-5 Several  Options 1-2 Min/Mod assist (not at baseline)   High Extensive 5 or more Multiple options 3 or more Max/dependent (not at baseline     Therapy Diagnosis: generalized weakness, decreased functional mobility  and decreased independence with ADL's due to s/p fall with head injury. Without therapy interventions, patient is at risk for falls and decreased independence.    Rehabilitation Potential: Prognosis: Fair;With continued OT s/p acute discharge      Plan:   OT Frequency Recommended: 3-4x/wk   Treatment Interventions: ADL retraining;Functional transfer training;UE strengthening/ROM;Endurance training;Patient/Family training;Neuro muscular reeducation          Risks/Benefits/POC Discussed with Pt/Family: With patient    Goals:   Goal Formulation: Patient  Time For Goal Achievement: by time of discharge  ADL Goals  Patient will groom self: Supervision;at sinkside;5 visits  Patient will dress lower body: Supervision;5 visits  Patient will toilet: Supervision;5 visits  Mobility and Transfer Goals  Pt will transfer bed to toilet: Supervision;5 visits                         Discharge Recommendations:   Based on today's session patient's discharge recommendation is the following: Discharge Recommendation: Acute Rehab.  Patient anticipated to benefit from and to be able to engage in 3 hours of therapy a day for 5 days a week.           If Discharge Recommendation: Acute Rehab is not available, then the patient will need 24/7 supervision, assistance with mobility, assistance with ADL's, SNF and with eventually follow up with OP concussion clinic.        Precautions and Contraindications: Falls Risk          Medical Diagnosis: Nausea [R11.0]  Seizure [R56.9]  Concussion without loss of consciousness, initial encounter [S06.0X0A]  Injury of head, initial encounter [S09.90XA]  Fall, initial encounter [W19.XXXA]  Leukocytosis, unspecified type [D72.829]  Pneumonia due to infectious organism, unspecified laterality, unspecified  part of lung [J18.9]    History of Present Illness: Yolanda Cox is a 87 y.o. female admitted on 04/08/2020 with "the chief complaint of Trauma and Head Injury     This is an 87 year old female with above-mentioned past medical and surgical history that was was brought in by EMS status post fall and head injury at the [employer] [facility] community. Per EMR, patient was walking at the retirement community that she is a resident of when she fell forward striking her forehead on the floor, patient had been reportedly nauseated prior to the fall. Per EMR, patient had a change in her mental status while in route to the hospital. At this time patient has a flat affect and very delayed responses, patient is alert to self and place. Patient reports of a headache and points to the back of her head and reports of a back ache, unable to provide detailed history at this time. History has been obtained from EMR."--as per H & P note.        Patient Active Problem List   Diagnosis    Fall, initial encounter        Past Medical/Surgical History:  Past Medical History:   Diagnosis Date    Anemia     Fibromyalgia     Hyperlipidemia     hypothyroid     Osteoporosis     Rheumatoid     TIA (transient ischemic attack)       Past Surgical History:   Procedure Laterality Date    CHOLECYSTECTOMY           X-Rays/Tests/Labs:  CT Reconstruction T-spine    Result Date: 04/08/2020   Mild anterior wedge compression fracture at T7 is age indeterminate, but has imaging features of chronicity. No definitive acute thoracic vertebral fracture, compression deformity or traumatic malalignment. Diffuse thoracic spondylosis. Waynard Edwards, MD  04/08/2020 6:00 PM    CT Head without  Contrast    Result Date: 04/08/2020   1. No CT evidence of acute intracranial abnormality. 2. Cerebral volume loss, intracranial atherosclerosis and moderate sequela of chronic small vessel ischemic disease. Waynard Edwards, MD  04/08/2020 5:03 PM    CT  Angiogram Chest    Result Date: 04/08/2020  COMBINED   No definite acute pathology noted within the chest abdomen or pelvis. Groundglass opacities within the right lower lobe could represent infection. Trace left pleural effusion. Jimmye Norman, MD  04/08/2020 6:21 PM    CT Cervical Spine WO Contrast    Result Date: 04/08/2020   No acute cervical spine fracture or traumatic malalignment. Cervical spondylosis and facet arthrosis with degenerative spondylolisthesis as described. Waynard Edwards, MD  04/08/2020 5:48 PM    MRI Brain W WO Contrast    Result Date: 04/09/2020   1. No MR evidence of acute intracranial abnormality, infarct, mass, hemorrhage or hydrocephalus. 2. Moderate generalized intraparenchymal volume loss with moderately advanced chronic microvascular sequela. Waynard Edwards, MD  04/09/2020 10:15 PM    CT Abdomen Pelvis W IV/ WO PO Cont    Result Date: 04/08/2020  COMBINED   No definite acute pathology noted within the chest abdomen or pelvis. Groundglass opacities within the right lower lobe could represent infection. Trace left pleural effusion. Jimmye Norman, MD  04/08/2020 6:21  PM     Chest AP Portable    Result Date: 04/08/2020  . Marland Kitchen Mild left basilar atelectasis Marty Heck, MD  04/08/2020 4:44 PM    CT Reconstruction L-spine    Result Date: 04/08/2020   No acute lumbar vertebral fracture, compression deformity or traumatic malalignment. Degenerative spondylosis and facet arthrosis superimposed upon S-shaped rotatory scoliosis, multilevel lateral listhesis and L5-S1 anterolisthesis. Waynard Edwards, MD  04/08/2020 5:52 PM        Social History:  Prior Level of Function  Prior level of function: Independent with ADLs,Ambulates with assistive device  Assistive Device: Front wheel walker  Driving: does not drive  Cooking: No (has meals prepared)  Home Living Arrangements  Living Arrangements: Family members  Type of Home: Apartment [place])  Home Layout: Elevator  Bathroom Shower/Tub: Walk-in  shower  Bathroom Toilet: Raised  Bathroom Equipment: Shower chair,Grab bars in shower,Hand-held shower,Grab bars around toilet      Subjective:   Patient is agreeable to participation in the therapy session. Nursing clears patient for therapy.  Subjective: Patient asleep upon arrival however easily aroused and agreed to OT session  Pain Assessment  Pain Assessment:  (Patient c/o headache upon arrival; RN notified of same).        Objective:   Observation of Patient/Vital Signs:  Patient is in bed with telemetry, peripheral IV and female external catheter in place.         Cognition/Neuro Status  Arousal/Alertness: Appropriate responses to stimuli  Attention Span: Appears intact  Orientation Level: Disoriented to situation;Oriented to place;Oriented to time;Oriented to person  Memory: Decreased recall of recent events;Decreased short term memory  Following Commands: minimal verbal instruction  Safety Awareness: maximal verbal instruction  Insights: Decreased awareness of deficits;Educated in safety awareness  Behavior: calm;cooperative;flat affect (delayed responses)  Motor Planning: decreased processing speed;decreased initiation  Coordination:  (intact for serial opposition)  Hand Dominance: right handed    Gross ROM  Right Upper Extremity ROM: within functional limits  Left Upper Extremity ROM: within functional limits  Gross Strength  Right Upper Extremity Strength: 4/5  Left Upper Extremity Strength: 4/5          Sensory  Auditory: intact  Tactile - Light Touch: intact (B UEs as per Patient report)       Self-care and Home Management  Grooming: Minimal Assist;standing at sink;steadying;verbal prompting;wash/dry hands (occasional R lateral leaning requring verbal and tactile instructions to increase weightbearing thru LLE with B UE support)    Mobility and Transfers  Scooting to EOB: Moderate Assist  Supine to Sit: Increased Time;Increased Effort;using bedrail;HOB raised;Moderate Assist  Sit to Supine: Min A for  B LE mgmt  Sit to Stand: Moderate Assist (from bed; 2 person assist for safety)Heavy R sided lateral lean upon standing  Bed to Chair: Moderate Assist (w/ B UE support; occasional R lateral leaning requiring verbal instruction to increase weightbearing thru L LE)     Balance  Static Sitting Balance:  (varied during the session from Max A initially to CGA))  Dyanamic Sitting Balance: poor  Static Standing Balance: fair (w/ B UE support)  Dynamic Standing Balance: poor (w/ B UE support)    Participation and Endurance  Participation Effort: fair  Endurance: Tolerates 10 - 20 min exercise with multiple rests    AM-PAC "6 Clicks" Daily Activity Inpatient Short Form  Inpatient AM-PAC Performed?: yes  Put On/Take Off Lower Body Clothing: A lot  Assist with Bathing: A lot  Assist with Toileting:  A lot  Put On/Take Off Upper Body Clothing: A lot  Assist with Grooming: A lot  Assist with Eating: A little  OT Daily Activity Raw Score: 13  CMS 0-100% Score: 63.03%    PMP - Progressive Mobility Protocol   PMP Activity: Step 6 - Walks in Room  Distance Walked (ft) (Step 6,7): 20 Feet    Treatment Activities: Increased time and effort to perform OOB transfers/mobility and ADL's due to decreased cognition/dizziness. Flat affect for majority of the session however able to follow simple commands and noted increase in verbalizations at end of session when asking to comb her hair. R lateral leaning noted upon standing from bed and again when standing from toilet; verbal and tactile instructions to facilitate upright posture and to increase weightbearing thru LLE and needed B UE support to maintain balance. Patient educated in importance of OOB sitting for all meals to increase endurance/strength for activities and for pulmonary hygiene. Patient in bed at end of session with all needs within reach. Patient instructed to ring for nursing for all needs and appeared receptive to all education provided. Bed Alarm activated (however ? If  set/broken,RN notified of same) for Patient's safety and RN notified of session outcome.      Educated the patient to role of occupational therapy, plan of care, goals of therapy and safety with mobility and ADLs, home safety.          Therapist PPE during session procedural mask, goggles  and gloves     Tennis Ship. Trixie Deis, MS,OTR/L  Pager # [phone]  [phone]

## 2020-04-11 ENCOUNTER — Inpatient Hospital Stay
Admission: RE | Admit: 2020-04-11 | Discharge: 2020-04-28 | DRG: 945 | Disposition: A | Discharge status: Home Health | Payer: Medicare Other | Source: Other Acute Inpatient Hospital | Attending: Student in an Organized Health Care Education/Training Program | Admitting: Student in an Organized Health Care Education/Training Program

## 2020-04-11 DIAGNOSIS — R55 Syncope and collapse: Secondary | ICD-10-CM

## 2020-04-11 DIAGNOSIS — J690 Pneumonitis due to inhalation of food and vomit: Secondary | ICD-10-CM | POA: Diagnosis present

## 2020-04-11 DIAGNOSIS — R5381 Other malaise: Principal | ICD-10-CM | POA: Diagnosis present

## 2020-04-11 DIAGNOSIS — Z8673 Personal history of transient ischemic attack (TIA), and cerebral infarction without residual deficits: Secondary | ICD-10-CM

## 2020-04-11 DIAGNOSIS — M810 Age-related osteoporosis without current pathological fracture: Secondary | ICD-10-CM

## 2020-04-11 DIAGNOSIS — F32A Depression, unspecified: Secondary | ICD-10-CM | POA: Diagnosis present

## 2020-04-11 DIAGNOSIS — X58XXXS Exposure to other specified factors, sequela: Secondary | ICD-10-CM | POA: Diagnosis present

## 2020-04-11 DIAGNOSIS — M069 Rheumatoid arthritis, unspecified: Secondary | ICD-10-CM | POA: Diagnosis present

## 2020-04-11 DIAGNOSIS — M797 Fibromyalgia: Secondary | ICD-10-CM | POA: Diagnosis present

## 2020-04-11 DIAGNOSIS — Z79890 Hormone replacement therapy (postmenopausal): Secondary | ICD-10-CM

## 2020-04-11 DIAGNOSIS — E1165 Type 2 diabetes mellitus with hyperglycemia: Secondary | ICD-10-CM | POA: Diagnosis present

## 2020-04-11 DIAGNOSIS — E785 Hyperlipidemia, unspecified: Secondary | ICD-10-CM | POA: Diagnosis present

## 2020-04-11 DIAGNOSIS — Z794 Long term (current) use of insulin: Secondary | ICD-10-CM

## 2020-04-11 DIAGNOSIS — W1830XD Fall on same level, unspecified, subsequent encounter: Secondary | ICD-10-CM

## 2020-04-11 DIAGNOSIS — E876 Hypokalemia: Secondary | ICD-10-CM | POA: Diagnosis not present

## 2020-04-11 DIAGNOSIS — R569 Unspecified convulsions: Secondary | ICD-10-CM | POA: Diagnosis present

## 2020-04-11 DIAGNOSIS — Z7951 Long term (current) use of inhaled steroids: Secondary | ICD-10-CM

## 2020-04-11 DIAGNOSIS — I10 Essential (primary) hypertension: Secondary | ICD-10-CM | POA: Diagnosis present

## 2020-04-11 DIAGNOSIS — Z7982 Long term (current) use of aspirin: Secondary | ICD-10-CM

## 2020-04-11 DIAGNOSIS — S0001XA Abrasion of scalp, initial encounter: Secondary | ICD-10-CM

## 2020-04-11 DIAGNOSIS — E039 Hypothyroidism, unspecified: Secondary | ICD-10-CM | POA: Diagnosis present

## 2020-04-11 DIAGNOSIS — A084 Viral intestinal infection, unspecified: Secondary | ICD-10-CM | POA: Diagnosis not present

## 2020-04-11 DIAGNOSIS — H9190 Unspecified hearing loss, unspecified ear: Secondary | ICD-10-CM | POA: Diagnosis present

## 2020-04-11 DIAGNOSIS — E119 Type 2 diabetes mellitus without complications: Secondary | ICD-10-CM

## 2020-04-11 DIAGNOSIS — Z79899 Other long term (current) drug therapy: Secondary | ICD-10-CM

## 2020-04-11 DIAGNOSIS — Z87891 Personal history of nicotine dependence: Secondary | ICD-10-CM

## 2020-04-11 DIAGNOSIS — R531 Weakness: Secondary | ICD-10-CM | POA: Diagnosis present

## 2020-04-11 DIAGNOSIS — Z9049 Acquired absence of other specified parts of digestive tract: Secondary | ICD-10-CM

## 2020-04-11 DIAGNOSIS — S22060A Wedge compression fracture of T7-T8 vertebra, initial encounter for closed fracture: Secondary | ICD-10-CM

## 2020-04-11 DIAGNOSIS — D638 Anemia in other chronic diseases classified elsewhere: Secondary | ICD-10-CM | POA: Diagnosis present

## 2020-04-11 DIAGNOSIS — N39498 Other specified urinary incontinence: Secondary | ICD-10-CM | POA: Diagnosis present

## 2020-04-11 DIAGNOSIS — R561 Post traumatic seizures: Secondary | ICD-10-CM | POA: Diagnosis present

## 2020-04-11 DIAGNOSIS — S0990XA Unspecified injury of head, initial encounter: Secondary | ICD-10-CM

## 2020-04-11 DIAGNOSIS — S069X0S Unspecified intracranial injury without loss of consciousness, sequela: Secondary | ICD-10-CM

## 2020-04-11 DIAGNOSIS — G3184 Mild cognitive impairment, so stated: Secondary | ICD-10-CM | POA: Diagnosis present

## 2020-04-11 DIAGNOSIS — S0001XD Abrasion of scalp, subsequent encounter: Secondary | ICD-10-CM

## 2020-04-11 DIAGNOSIS — R471 Dysarthria and anarthria: Secondary | ICD-10-CM | POA: Diagnosis present

## 2020-04-11 LAB — BASIC METABOLIC PANEL
Anion Gap: 11 (ref 5.0–15.0)
BUN: 15.4 mg/dL (ref 7.0–19.0)
CO2: 24 mEq/L (ref 22–29)
Calcium: 8.7 mg/dL (ref 7.9–10.2)
Chloride: 106 mEq/L (ref 100–111)
Creatinine: 0.8 mg/dL (ref 0.6–1.0)
Glucose: 226 mg/dL — ABNORMAL HIGH (ref 70–100)
Potassium: 3.8 mEq/L (ref 3.5–5.1)
Sodium: 141 mEq/L (ref 136–145)

## 2020-04-11 LAB — CBC
Absolute NRBC: 0 10*3/uL (ref 0.00–0.00)
Hematocrit: 31.5 % — ABNORMAL LOW (ref 34.7–43.7)
Hgb: 10.4 g/dL — ABNORMAL LOW (ref 11.4–14.8)
MCH: 31 pg (ref 25.1–33.5)
MCHC: 33 g/dL (ref 31.5–35.8)
MCV: 93.8 fL (ref 78.0–96.0)
MPV: 10.5 fL (ref 8.9–12.5)
Nucleated RBC: 0 /100 WBC (ref 0.0–0.0)
Platelets: 164 10*3/uL (ref 142–346)
RBC: 3.36 10*6/uL — ABNORMAL LOW (ref 3.90–5.10)
RDW: 16 % — ABNORMAL HIGH (ref 11–15)
WBC: 11.34 10*3/uL — ABNORMAL HIGH (ref 3.10–9.50)

## 2020-04-11 LAB — GLUCOSE WHOLE BLOOD - POCT
Whole Blood Glucose POCT: 197 mg/dL — ABNORMAL HIGH (ref 70–100)
Whole Blood Glucose POCT: 343 mg/dL — ABNORMAL HIGH (ref 70–100)
Whole Blood Glucose POCT: 229 mg/dL — ABNORMAL HIGH (ref 70–100)
Whole Blood Glucose POCT: 144 mg/dL — ABNORMAL HIGH (ref 70–100)

## 2020-04-11 LAB — GFR: EGFR: >60

## 2020-04-11 MED ORDER — ATORVASTATIN CALCIUM 40 MG PO TABS
40.0000 mg | ORAL_TABLET | Freq: Every evening | ORAL | Status: DC
Start: 2020-04-11 — End: 2020-04-28
  Administered 2020-04-11 – 2020-04-27 (×15): 40 mg via ORAL
  Filled 2020-04-11 (×15): qty 1

## 2020-04-11 MED ORDER — AMOXICILLIN-POT CLAVULANATE 875-125 MG PO TABS
1.0000 | ORAL_TABLET | Freq: Two times a day (BID) | ORAL | 0 refills | Status: DC
Start: 2020-04-11 — End: 2020-04-27

## 2020-04-11 MED ORDER — MELATONIN 3 MG PO TABS
3.0000 mg | ORAL_TABLET | Freq: Every evening | ORAL | Status: DC | PRN
Start: 2020-04-11 — End: 2020-04-28

## 2020-04-11 MED ORDER — GLUCOSE 40 % PO GEL
15.0000 g | ORAL | Status: DC | PRN
Start: 2020-04-11 — End: 2020-04-28

## 2020-04-11 MED ORDER — ALBUTEROL-IPRATROPIUM 2.5-0.5 (3) MG/3ML IN SOLN
3.0000 mL | Freq: Four times a day (QID) | RESPIRATORY_TRACT | Status: DC | PRN
Start: 2020-04-11 — End: 2020-04-28

## 2020-04-11 MED ORDER — VITAMIN D 25 MCG (1000 UT) PO TABS
25.0000 ug | ORAL_TABLET | Freq: Every day | ORAL | Status: DC
Start: 2020-04-12 — End: 2020-04-28
  Administered 2020-04-12 – 2020-04-28 (×15): 25 ug via ORAL
  Filled 2020-04-11 (×15): qty 1

## 2020-04-11 MED ORDER — AMOXICILLIN-POT CLAVULANATE 875-125 MG PO TABS
1.0000 | ORAL_TABLET | Freq: Two times a day (BID) | ORAL | Status: AC
Start: 2020-04-11 — End: 2020-04-16
  Administered 2020-04-11 – 2020-04-16 (×10): 1 via ORAL
  Filled 2020-04-11 (×10): qty 1

## 2020-04-11 MED ORDER — MAGNESIUM HYDROXIDE 400 MG/5ML PO SUSP
30.0000 mL | Freq: Every day | ORAL | Status: DC | PRN
Start: 2020-04-11 — End: 2020-04-28

## 2020-04-11 MED ORDER — INSULIN LISPRO 100 UNIT/ML SC SOLN
1.0000 [IU] | Freq: Every evening | SUBCUTANEOUS | Status: DC
Start: 2020-04-11 — End: 2020-04-28
  Administered 2020-04-12: 21:00:00 1 [IU] via SUBCUTANEOUS
  Administered 2020-04-13 – 2020-04-14 (×2): 2 [IU] via SUBCUTANEOUS
  Administered 2020-04-19 – 2020-04-25 (×4): 1 [IU] via SUBCUTANEOUS
  Filled 2020-04-11 (×8): qty 3

## 2020-04-11 MED ORDER — CALCIUM CITRATE-VITAMIN D 315-250 MG-UNIT PO TABS
2.0000 | ORAL_TABLET | Freq: Every day | ORAL | Status: DC
Start: 2020-04-12 — End: 2020-04-28
  Administered 2020-04-12 – 2020-04-28 (×15): 2 via ORAL
  Filled 2020-04-11 (×15): qty 2

## 2020-04-11 MED ORDER — INSULIN LISPRO 100 UNIT/ML SC SOLN
1.0000 [IU] | Freq: Three times a day (TID) | SUBCUTANEOUS | Status: DC
Start: 2020-04-11 — End: 2020-04-28
  Administered 2020-04-11 – 2020-04-12 (×2): 2 [IU] via SUBCUTANEOUS
  Administered 2020-04-12: 13:00:00 3 [IU] via SUBCUTANEOUS
  Administered 2020-04-12 – 2020-04-14 (×4): 2 [IU] via SUBCUTANEOUS
  Administered 2020-04-14: 13:00:00 3 [IU] via SUBCUTANEOUS
  Administered 2020-04-15: 17:00:00 2 [IU] via SUBCUTANEOUS
  Administered 2020-04-15 – 2020-04-16 (×2): 1 [IU] via SUBCUTANEOUS
  Administered 2020-04-16 – 2020-04-17 (×3): 2 [IU] via SUBCUTANEOUS
  Administered 2020-04-18: 12:00:00 1 [IU] via SUBCUTANEOUS
  Administered 2020-04-18: 18:00:00 2 [IU] via SUBCUTANEOUS
  Administered 2020-04-19: 18:00:00 3 [IU] via SUBCUTANEOUS
  Administered 2020-04-20 – 2020-04-25 (×8): 2 [IU] via SUBCUTANEOUS
  Administered 2020-04-27 – 2020-04-28 (×3): 1 [IU] via SUBCUTANEOUS
  Filled 2020-04-11 (×2): qty 6
  Filled 2020-04-11: qty 9
  Filled 2020-04-11 (×2): qty 6
  Filled 2020-04-11 (×5): qty 3
  Filled 2020-04-11: qty 6
  Filled 2020-04-11: qty 3
  Filled 2020-04-11: qty 6
  Filled 2020-04-11: qty 3
  Filled 2020-04-11: qty 6
  Filled 2020-04-11: qty 3
  Filled 2020-04-11 (×2): qty 6
  Filled 2020-04-11: qty 3
  Filled 2020-04-11: qty 6
  Filled 2020-04-11 (×2): qty 3
  Filled 2020-04-11: qty 6
  Filled 2020-04-11: qty 3

## 2020-04-11 MED ORDER — ACETAMINOPHEN 325 MG PO TABS
650.0000 mg | ORAL_TABLET | Freq: Four times a day (QID) | ORAL | Status: DC | PRN
Start: 2020-04-11 — End: 2020-04-28
  Administered 2020-04-11 – 2020-04-13 (×3): 650 mg via ORAL
  Filled 2020-04-11 (×3): qty 2

## 2020-04-11 MED ORDER — ONDANSETRON 4 MG PO TBDP
4.0000 mg | ORAL_TABLET | Freq: Three times a day (TID) | ORAL | Status: DC | PRN
Start: 2020-04-11 — End: 2020-04-28

## 2020-04-11 MED ORDER — SERTRALINE HCL 50 MG PO TABS
50.0000 mg | ORAL_TABLET | Freq: Every day | ORAL | Status: DC
Start: 2020-04-12 — End: 2020-04-28
  Administered 2020-04-12 – 2020-04-28 (×15): 50 mg via ORAL
  Filled 2020-04-11 (×15): qty 1

## 2020-04-11 MED ORDER — AMLODIPINE BESYLATE 5 MG PO TABS
10.0000 mg | ORAL_TABLET | Freq: Every day | ORAL | Status: DC
Start: 2020-04-12 — End: 2020-04-28
  Administered 2020-04-12 – 2020-04-28 (×15): 10 mg via ORAL
  Filled 2020-04-11 (×15): qty 2

## 2020-04-11 MED ORDER — FOLIC ACID 1 MG PO TABS
1.0000 mg | ORAL_TABLET | Freq: Every day | ORAL | Status: DC
Start: 2020-04-12 — End: 2020-04-28
  Administered 2020-04-12 – 2020-04-28 (×15): 1 mg via ORAL
  Filled 2020-04-11 (×15): qty 1

## 2020-04-11 MED ORDER — RISAQUAD PO CAPS
1.0000 | ORAL_CAPSULE | Freq: Every day | ORAL | Status: DC
Start: 2020-04-12 — End: 2020-04-27

## 2020-04-11 MED ORDER — SENNOSIDES-DOCUSATE SODIUM 8.6-50 MG PO TABS
2.0000 | ORAL_TABLET | Freq: Every evening | ORAL | Status: DC | PRN
Start: 2020-04-11 — End: 2020-04-28
  Administered 2020-04-15: 22:00:00 2 via ORAL
  Filled 2020-04-11: qty 2

## 2020-04-11 MED ORDER — LEVOTHYROXINE SODIUM 25 MCG PO TABS
75.0000 ug | ORAL_TABLET | Freq: Every day | ORAL | Status: DC
Start: 2020-04-12 — End: 2020-04-28
  Administered 2020-04-12 – 2020-04-28 (×15): 75 ug via ORAL
  Filled 2020-04-11 (×13): qty 3
  Filled 2020-04-11: qty 2
  Filled 2020-04-11 (×2): qty 3

## 2020-04-11 MED ORDER — BUTALBITAL-APAP-CAFFEINE 50-325-40 MG PO TABS
1.0000 | ORAL_TABLET | ORAL | Status: DC | PRN
Start: 2020-04-11 — End: 2020-04-27

## 2020-04-11 MED ORDER — LEVETIRACETAM 500 MG PO TABS
500.0000 mg | ORAL_TABLET | Freq: Two times a day (BID) | ORAL | Status: DC
Start: 2020-04-11 — End: 2020-04-28
  Administered 2020-04-11 – 2020-04-28 (×30): 500 mg via ORAL
  Filled 2020-04-11 (×30): qty 1

## 2020-04-11 MED ORDER — DEXTROSE 50 % IV SOLN
12.5000 g | INTRAVENOUS | Status: DC | PRN
Start: 2020-04-11 — End: 2020-04-28

## 2020-04-11 MED ORDER — RISAQUAD PO CAPS
1.0000 | ORAL_CAPSULE | Freq: Every day | ORAL | Status: DC
Start: 2020-04-12 — End: 2020-04-28
  Administered 2020-04-12 – 2020-04-28 (×15): 1 via ORAL
  Filled 2020-04-11 (×15): qty 1

## 2020-04-11 MED ORDER — GLUCAGON 1 MG IJ SOLR (WRAP)
1.0000 mg | INTRAMUSCULAR | Status: DC | PRN
Start: 2020-04-11 — End: 2020-04-28

## 2020-04-11 MED ORDER — INSULIN LISPRO 100 UNIT/ML SC SOLN
SUBCUTANEOUS | Status: DC
Start: 2020-04-11 — End: 2020-04-27

## 2020-04-11 MED ORDER — LEVETIRACETAM 500 MG PO TABS
500.0000 mg | ORAL_TABLET | Freq: Two times a day (BID) | ORAL | Status: DC
Start: 2020-04-11 — End: 2020-04-27

## 2020-04-11 MED ORDER — AMLODIPINE BESYLATE 10 MG PO TABS
10.0000 mg | ORAL_TABLET | Freq: Every day | ORAL | Status: DC
Start: 2020-04-12 — End: 2020-04-27

## 2020-04-11 MED ORDER — BUTALBITAL-APAP-CAFFEINE 50-325-40 MG PO TABS
1.0000 | ORAL_TABLET | ORAL | Status: DC | PRN
Start: 2020-04-11 — End: 2020-04-28
  Administered 2020-04-13 – 2020-04-15 (×2): 1 via ORAL
  Filled 2020-04-11 (×2): qty 1

## 2020-04-11 MED ORDER — GUAIFENESIN 100 MG/5ML PO SOLN
200.0000 mg | ORAL | Status: DC | PRN
Start: 2020-04-11 — End: 2020-04-27

## 2020-04-11 MED ORDER — ASPIRIN 325 MG PO TABS
325.0000 mg | ORAL_TABLET | Freq: Every day | ORAL | Status: DC
Start: 2020-04-12 — End: 2020-04-28
  Administered 2020-04-12 – 2020-04-28 (×15): 325 mg via ORAL
  Filled 2020-04-11 (×15): qty 1

## 2020-04-11 MED ORDER — ALBUTEROL-IPRATROPIUM 2.5-0.5 (3) MG/3ML IN SOLN
3.0000 mL | Freq: Four times a day (QID) | RESPIRATORY_TRACT | Status: DC | PRN
Start: 2020-04-11 — End: 2020-04-27

## 2020-04-11 MED ORDER — TAB-A-VITE/BETA CAROTENE PO TABS
1.0000 | ORAL_TABLET | Freq: Every day | ORAL | Status: DC
Start: 2020-04-12 — End: 2020-04-28
  Administered 2020-04-12 – 2020-04-28 (×15): 1 via ORAL
  Filled 2020-04-11 (×15): qty 1

## 2020-04-11 NOTE — Plan of Care (Signed)
Problem: Moderate/High Fall Risk Score >5  Goal: Patient will remain free of falls  Outcome: Progressing  Flowsheets (Taken 04/10/2020 2000)  High (Greater than 13):   HIGH-Bed alarm on at all times while patient in bed   HIGH-Utilize chair pad alarm for patient while in the chair   HIGH-Apply yellow "Fall Risk" arm band   HIGH-Initiate use of floor mats as appropriate   LOW-Fall Interventions Appropriate for Low Fall Risk   LOW-Anticoagulation education for injury risk   MOD-Use of chair-pad alarm when appropriate   MOD-Consider a move closer to Nurses Station   MOD-Remain with patient during toileting     Problem: Safety  Goal: Patient will be free from injury during hospitalization  Outcome: Progressing  Flowsheets (Taken 04/11/2020 0346)  Patient will be free from injury during hospitalization:   Assess patient's risk for falls and implement fall prevention plan of care per policy   Provide and maintain safe environment   Use appropriate transfer methods   Ensure appropriate safety devices are available at the bedside   Hourly rounding   Include patient/ family/ care giver in decisions related to safety   Assess for patients risk for elopement and implement Elopement Risk Plan per policy  Goal: Patient will be free from infection during hospitalization  Outcome: Progressing  Flowsheets (Taken 04/11/2020 0346)  Free from Infection during hospitalization:   Assess and monitor for signs and symptoms of infection   Monitor lab/diagnostic results   Monitor all insertion sites (i.e. indwelling lines, tubes, urinary catheters, and drains)   Encourage patient and family to use good hand hygiene technique     Problem: Pain  Goal: Pain at adequate level as identified by patient  Outcome: Progressing  Flowsheets (Taken 04/11/2020 0346)  Pain at adequate level as identified by patient:   Identify patient comfort function goal   Assess for risk of opioid induced respiratory depression, including snoring/sleep apnea. Alert  healthcare team of risk factors identified.   Assess pain on admission, during daily assessment and/or before any "as needed" intervention(s)   Reassess pain within 30-60 minutes of any procedure/intervention, per Pain Assessment, Intervention, Reassessment (AIR) Cycle   Consult/collaborate with Physical Therapy, Occupational Therapy, and/or Speech Therapy   Include patient/patient care companion in decisions related to pain management as needed   Evaluate if patient comfort function goal is met     Problem: Side Effects from Pain Analgesia  Goal: Patient will experience minimal side effects of analgesic therapy  Outcome: Progressing  Flowsheets (Taken 04/11/2020 0346)  Patient will experience minimal side effects of analgesic therapy:   Monitor/assess patient's respiratory status (RR depth, effort, breath sounds)   Assess for changes in cognitive function   Prevent/manage side effects per LIP orders (i.e. nausea, vomiting, pruritus, constipation, urinary retention, etc.)     Problem: Discharge Barriers  Goal: Patient will be discharged home or other facility with appropriate resources  Outcome: Progressing  Flowsheets (Taken 04/11/2020 0346)  Discharge to home or other facility with appropriate resources:   Provide appropriate patient education   Provide information on available health resources   Initiate discharge planning     Problem: Psychosocial and Spiritual Needs  Goal: Demonstrates ability to cope with hospitalization/illness  Outcome: Progressing  Flowsheets (Taken 04/11/2020 0346)  Demonstrates ability to cope with hospitalizations/illness:   Encourage verbalization of feelings/concerns/expectations   Provide quiet environment   Assist patient to identify own strengths and abilities   Include patient/ patient care companion in decisions  Problem: Compromised Tissue integrity  Goal: Damaged tissue is healing and protected  Outcome: Progressing  Flowsheets (Taken 04/11/2020 0346)  Damaged tissue is healing  and protected:   Monitor/assess Braden scale every shift   Provide wound care per wound care algorithm   Reposition patient every 2 hours and as needed unless able to reposition self   Increase activity as tolerated/progressive mobility   Relieve pressure to bony prominences for patients at moderate and high risk   Avoid shearing injuries   Keep intact skin clean and dry   Monitor external devices/tubes for correct placement to prevent pressure, friction and shearing   Encourage use of lotion/moisturizer on skin   Monitor patient's hygiene practices   Consult/collaborate with wound care nurse  Goal: Nutritional status is improving  Outcome: Progressing  Flowsheets (Taken 04/11/2020 0346)  Nutritional status is improving:   Assist patient with eating   Allow adequate time for meals   Encourage patient to take dietary supplement(s) as ordered   Include patient/patient care companion in decisions related to nutrition

## 2020-04-11 NOTE — Discharge Summary (Signed)
[facility] HOSPITALIST   [place] Summary   Patient Info:   Date/Time: 04/11/2020 / 3:18 PM   Admit Date:04/08/2020  Patient Name:Yolanda Cox   ZOX:[id number]   PCP: Shelbie Proctor, MD  Attending Physician:Shabre Kreher, Olen Pel, MD     Hospital Course:   Please see H&P for complete details of HPI and ROS. The patient was admitted to [facility] and has been taken care as mentioned below.    Patient was sent from [facility] on 1/8 after she had a syncopal episode.  Patient informs she remembers feeling nauseous and was walking to the dinner to ask for help but passed out before that.  While in the ER she had a episode of tonic-clonic seizure as described by her daughter.  Patient has history of traumatic brain bleed in [place] about a year ago.  She also has history of normal pressure hydrocephalus    New onset seizure  Syncopal episode  Fall with scalp abrasion  -Normal EEG per Dr Marney Doctor   -MRI with contrast shows no evidence of acute intracranial abnormality, infarct, mass, hemorrhage or hydrocephalus.  Moderate generalized intraparenchymal volume loss with moderately advanced chronic microvascular sequela.  -Appreciate neurology evaluation-discussed the case with Dr. Marney Doctor  -Seizure precautions  -Started on Keppra 500 twice daily as per Dr. Marney Doctor    Possible aspiration pneumonia-possibly related to seizures  -Initially treated with Zosyn  - will [place] with Augmentin for 5 more days   - Probiotics  - Nebs as needed  - Incentive spirometry  - F/u CXR in 4-6 weeks for resolution  - Robitussin PRN  - Appreciate Speech eval - suggested acute rehab    Closed head injury after a fall  -CT head shows no evidence of acute intracranial abnormality  -Tylenol and Fioricet as needed for headache    Mild anterior wedge compression fracture of T7  -Age-indeterminate, no acute findings  -No tenderness on exam    HTN  - Cont Norvasc to 10 mg    Diabetes type 2 - New diagnosis   HBA1C - 7.6  - Diabetic diet   -  Daughter informs she has hyperglycemia only during crisis and recovers later - will not start ant oral hypoglycemic as she can get hypoglycemic - Advised sliding scale insulin     Hyperlipidemia  -Continue Lipitor    Hypothyroidism  Continue Synthroid    History of rheumatoid arthritis  -On methotrexate weekly    History of osteoporosis  -Continue Os-Cal and vitamin D    Updated daughter at the bedside      Disposition:home  Condition at Discharge and Prognosis: stable   Admission Date:04/08/2020  Discharge Date: 04/11/20  Type of Admission:Inpatient   Code Status: NO CPR  -  ALLOW NATURAL DEATH  Subjective at the time of discharge:   Pt informs she feels better today  No chest pain , SOB or palpitations     Chief Complaint:  Trauma and Head Injury    Objective:     Vitals:    04/11/20 0520 04/11/20 0750 04/11/20 1041 04/11/20 1212   BP: 164/72 163/72 136/76 127/71   Pulse:  71 79 67   Resp:  14 20 14    Temp:  97.9 F (36.6 C) 97.6 F (36.4 C) 98.1 F (36.7 C)   TempSrc:  Oral Oral Oral   SpO2:  95% 97% 96%   Weight:       Height:  Physical Exam:   Heart - S1, S2 heard.  No murmur  Lungs - bilateral air entry present, no wheeze or rales  Abdomen - soft, bowel sounds normal, nondistended, no tenderness  Central nervous system - no focal deficits, drowsy but arousible   Musculoskeletal - no edema     Clinical Presentation:   History of Presenting Illness: Please refer to HPI in the Detailed H&P  Discharge Medications:   Discharge Medications:      Discharge Medication List      Taking    albuterol-ipratropium 2.5-0.5(3) mg/3 mL nebulizer  Dose: 3 mL  Commonly known as: DUO-NEB  Take 3 mLs by nebulization every 6 (six) hours as needed (Shortness of breath or wheezing)     amLODIPine 10 MG tablet  Dose: 10 mg  Commonly known as: NORVASC  Start taking on: April 12, 2020  Take 1 tablet (10 mg total) by mouth daily     amoxicillin-clavulanate 875-125 MG per tablet  Dose: 1 tablet  Commonly known as:  AUGMENTIN  Take 1 tablet by mouth 2 (two) times daily for 5 days     aspirin 325 MG tablet  Dose: 325 mg  Take 325 mg by mouth daily     atorvastatin 40 MG tablet  Dose: 40 mg  Commonly known as: LIPITOR  Take 40 mg by mouth daily     butalbital-acetaminophen-caffeine 50-325-40 MG per tablet  Dose: 1 tablet  Commonly known as: FIORICET  Take 1 tablet by mouth every 4 (four) hours as needed for Headaches     calcium carbonate 600 MG Tabs tablet  Dose: 600 mg  Commonly known as: OS-CAL  Take 600 mg by mouth daily     folic acid 1 MG tablet  Dose: 1 mg  Commonly known as: FOLVITE  Take 1 mg by mouth daily     guaiFENesin 100 MG/5ML Soln  Dose: 200 mg  Commonly known as: ROBITUSSIN  Take 10 mLs (200 mg total) by mouth every 4 (four) hours as needed (cough)     insulin lispro 100 UNIT/ML injection  Commonly known as: HumaLOG  If BG >180 add the following: 181 - 200 - 2 unit 201 - 250 - 3 unit 251 - 300 - 4 unit 301 - 350 - 5 unit >350 give additional 10 unit     lactobacillus/streptococcus Caps  Dose: 1 capsule  Start taking on: April 12, 2020  Take 1 capsule by mouth daily     levETIRAcetam 500 MG tablet  Dose: 500 mg  Commonly known as: KEPPRA  Take 1 tablet (500 mg total) by mouth every 12 (twelve) hours     levothyroxine 75 MCG tablet  Dose: 75 mcg  Commonly known as: SYNTHROID  Take 75 mcg by mouth Once a day at 6:00am     methotrexate 2.5 MG tablet  For: Rheumatoid Arthritis  Take by mouth once a week     multivitamin capsule  Dose: 1 capsule  Take 1 capsule by mouth daily     OMEGA-3 FISH OIL PO  Dose: 1,000 mg  Take 1,000 mg by mouth daily     sertraline 50 MG tablet  Dose: 50 mg  Commonly known as: ZOLOFT  Take 50 mg by mouth daily     silver sulfADIAZINE 1 % cream  Commonly known as: SILVADENE  Apply topically daily     vitamin D 25 MCG (1000 UT) tablet  Dose: 25 mcg  Commonly known as: cholecalciferol  Take 25 mcg by mouth daily          Follow up recommendations:   Follow up:    Follow-up Information      Ro, Saunders Glance, MD .    Specialty: Internal Medicine  Contact information:  [address] [place] [address]  [phone]             Everlean Patterson, MD. Schedule an appointment as soon as possible for a visit.    Specialty: Neurology  Contact information:  [address]  [place] [address]  [phone]                        Results of Labs/imaging:   Labs have been reviewed:   Coagulation Profile:   Recent Labs   Lab 04/08/20  1639   PT 13.4*   PT INR 1.1   PTT 36       CBC review:   Recent Labs   Lab 04/11/20  0455 04/10/20  0519 04/08/20  1639   WBC 11.34* 18.92* 20.05*   Hgb 10.4* 10.2* 11.0*   Hematocrit 31.5* 31.4* 34.1*   Platelets 164 149 185   MCV 93.8 94.6 94.2   RDW 16* 16* 16*     Chem Review:  Recent Labs   Lab 04/11/20  0455 04/10/20  0519 04/09/20  0914 04/08/20  1641   Sodium 141 138 138  --    Potassium 3.8 3.8 4.0  --    Chloride 106 105 105  --    CO2 24 22 24   --    BUN 15.4 13.0 15.3  --    Creatinine 0.8 0.8 0.8  --    Creatinine I-Stat  --   --   --  0.6   Glucose 226* 164* 208*  --    Calcium 8.7 8.8 8.9  --    Magnesium  --   --  2.1  --    Phosphorus  --   --  3.2  --    Bilirubin, Total  --   --  1.6*  --    AST (SGOT)  --   --  20  --    ALT  --   --  11  --    Alkaline Phosphatase  --   --  57  --      Results     Procedure Component Value Units Date/Time    CULTURE BLOOD AEROBIC AND ANAEROBIC [[id number]] Collected: 04/09/20 0914    Specimen: Blood, Venipuncture Updated: 04/11/20 1421    Narrative:      The order will result in two separate 8-46ml bottles  Please do NOT order repeat blood cultures if one has been  drawn within the last 48 hours  UNLESS concerned for  endocarditis  AVOID BLOOD CULTURE DRAWS FROM CENTRAL LINE IF POSSIBLE  Indications:->Pneumonia  ORDER#: [id number]                                    ORDERED BY: DABHIYA, MUNIRA  SOURCE: Blood, Venipuncture Peripheral, Left arm     COLLECTED:  04/09/20 09:14  ANTIBIOTICS AT COLL.:                                 RECEIVED :  04/09/20  13:53  Culture Blood Aerobic and Anaerobic        PRELIM      04/11/20 14:21  04/10/20   No Growth after 1 day/s of incubation.  04/11/20   No Growth after 2 day/s of incubation.      CULTURE BLOOD AEROBIC AND ANAEROBIC [[id number]] Collected: 04/09/20 0914    Specimen: Blood, Venipuncture Updated: 04/11/20 1421    Narrative:      The order will result in two separate 8-66ml bottles  Please do NOT order repeat blood cultures if one has been  drawn within the last 48 hours  UNLESS concerned for  endocarditis  AVOID BLOOD CULTURE DRAWS FROM CENTRAL LINE IF POSSIBLE  Indications:->Pneumonia  ORDER#: [id number]                                    ORDERED BY: DABHIYA, MUNIRA  SOURCE: Blood, Venipuncture LeFT HAND                COLLECTED:  04/09/20 09:14  ANTIBIOTICS AT COLL.:                                RECEIVED :  04/09/20 13:53  Culture Blood Aerobic and Anaerobic        PRELIM      04/11/20 14:21  04/10/20   No Growth after 1 day/s of incubation.  04/11/20   No Growth after 2 day/s of incubation.      Glucose Whole Blood - POCT [[id number]]  (Abnormal) Collected: 04/11/20 1211     Updated: 04/11/20 1232     Whole Blood Glucose POCT 343 mg/dL     Glucose Whole Blood - POCT [[id number]]  (Abnormal) Collected: 04/11/20 0749     Updated: 04/11/20 0830     Whole Blood Glucose POCT 197 mg/dL     Basic Metabolic Panel [[id number]]  (Abnormal) Collected: 04/11/20 0455    Specimen: Blood Updated: 04/11/20 0557     Glucose 226 mg/dL      BUN 47.4 mg/dL      Creatinine 0.8 mg/dL      Calcium 8.7 mg/dL      Sodium 259 mEq/L      Potassium 3.8 mEq/L      Chloride 106 mEq/L      CO2 24 mEq/L      Anion Gap 11.0    GFR [[id number]] Collected: 04/11/20 0455     Updated: 04/11/20 0557     EGFR >60.0    CBC without differential [[id number]]  (Abnormal) Collected: 04/11/20 0455    Specimen: Blood Updated: 04/11/20 0540     WBC 11.34 x10 3/uL      Hgb 10.4 g/dL      Hematocrit 66.0 %      Platelets 164 x10 3/uL       RBC 3.36 x10 6/uL      MCV 93.8 fL      MCH 31.0 pg      MCHC 33.0 g/dL      RDW 16 %      MPV 10.5 fL      Nucleated RBC 0.0 /100 WBC      Absolute NRBC 0.00 x10 3/uL     Glucose Whole Blood - POCT [[id number]]  (Abnormal) Collected: 04/10/20 2033     Updated: 04/10/20 2056     Whole  Blood Glucose POCT 275 mg/dL     Glucose Whole Blood - POCT [[id number]]  (Abnormal) Collected: 04/10/20 1638     Updated: 04/10/20 1658     Whole Blood Glucose POCT 194 mg/dL         Radiology reports have been reviewed:  Radiology Results (24 Hour)     ** No results found for the last 24 hours. **        CT Reconstruction T-spine    Result Date: 04/08/2020  HISTORY: Trauma with posttraumatic thoracic back pain. COMPARISON: None. TECHNIQUE: Small field-of-view axial reconstructions of the thoracic spine were rendered retrospectively from enhanced thoracic and abdominal CT. Coronal and sagittal reconstructions were provided. This CT study was performed using radiation dose reduction techniques including one or more of the following: automated exposure control, adjustment of the mA and/or kV according to patient size, and the use of iterative reconstruction technique. FINDINGS: The thoracic spine is normally aligned on both the coronal and sagittal reconstructions. There is a mild, age-indeterminate anterior wedge compression fracture at T7 with downward concavity the superior endplate and roughly 30% vertebral body height loss anteriorly. There is no posterior vertebral body height loss or bony retropulsion. The vertebral body heights are otherwise well maintained. The facets are well aligned bilaterally. The central canal contents are unremarkable within the limits of this modality. Diffuse discogenic degeneration is present without obvious canal or foraminal encroachment.      Mild anterior wedge compression fracture at T7 is age indeterminate, but has imaging features of chronicity. No definitive acute thoracic vertebral fracture,  compression deformity or traumatic malalignment. Diffuse thoracic spondylosis. Waynard Edwards, MD  04/08/2020 6:00 PM    CT Head without  Contrast    Result Date: 04/08/2020  HISTORY: Trauma with head injury. COMPARISON: None. TECHNIQUE: Serial axial images were obtained from the skull base to the vertex without intravenous contrast. This CT study was performed using radiation dose reduction techniques including one or more of the following: automated exposure control, adjustment of the mA and/or kV according to patient size, and the use of iterative reconstruction technique. FINDINGS: There is diffuse soft tissue swelling with subcutaneous hematoma formation over the left parietal and occipital scalp. The calvarium is intact. The ventricles and sulci are prominent, consistent with moderate generalized cerebral parenchymal volume loss. The ventricles are symmetric and the basal cisterns are patent. Moderate periventricular, deep and subcortical white matter hypodensities are nonspecific but statistically reflect the sequela of chronic small vessel ischemic change. No intracranial hemorrhage, extra-axial fluid collection, midline shift or mass-effect is evident. No evidence of acute large vessel or territorial ischemia. Vascular calcifications indicate intracranial atherosclerosis. The visualized paranasal sinuses and mastoid air cells are clear. There are bilateral lens implants present from prior cataract extractions. The orbital contents are unremarkable.      1. No CT evidence of acute intracranial abnormality. 2. Cerebral volume loss, intracranial atherosclerosis and moderate sequela of chronic small vessel ischemic disease. Waynard Edwards, MD  04/08/2020 5:03 PM    CT Angiogram Chest    Result Date: 04/08/2020  PROCEDURE:  CT ANGIOGRAM CHEST, CT ABDOMEN PELVIS W IV/ WO PO CONT HISTORY:  Trauma TECHNIQUE: CT of the chest was performed. MIPS images were obtained. CT of the abdomen and pelvis was performed using  non-ionic intravenous contrast.  Omnipaque 350 100 cc IV given. Oral contrast was not administered. COMPARISON:  None FINDINGS CT CHEST:  Support devices: None. Lower cervical region: Unremarkable. Lymph nodes: There is no thoracic adenopathy.  Pleural spaces/diaphragm:Trace left pleural effusion. Heart/Pericardium/Great Vessels: Heart is normal in size. No pericardial effusion. Mild coronary calcifications. Aorta is normal in size. Atheromatous disease of the aorta. Main pulmonary artery is normal in caliber. No main, right left, or lobar pulmonary embolism. Lungs: No endobronchial lesion. Biapical pleural-parenchymal thickening. Bibasilar subsegmental atelectasis. There is patchy right lower lobe groundglass opacity. Upper GI tract: The esophagus is grossly unremarkable. Body wall:Chest wall soft tissues are unremarkable. FINDINGS CT ABDOMEN/PELVIS: Liver: Multiple subcentimeter hypodense lesions throughout the liver too small to characterize. Biliary tree: The patient is post cholecystectomy. Mild extrahepatic biliary ductal dilatation with distal tapering likely secondary to postcholecystectomy state. Spleen: The spleen is normal in size. Pancreas:  Atrophic Adrenal glands:  The adrenal glands are normal in size and shape. Kidneys:  There are bilateral symmetric nephrograms without hydronephrosis. Small low density foci in the kidneys are too small to characterize. Lymph nodes: Abdomen: There is no abdominal adenopathy. Pelvis: There is no pelvic adenopathy. Vasculature:  There is no abdominal aortic aneurysm. Atherosclerotic calcification is seen. Peritoneum/mesentery/omentum:  There is no free fluid or free air. GI tract:  There is no bowel obstruction. Pelvic urogenital structures: Bladder is normal. Uterus is present. Body wall:  Abdominal wall soft tissues are unremarkable Bones: Degenerative changes of the spine. Anterior wedge compression deformity of the T7 vertebral body. Please see separate report for  further evaluation of the thoracic and lumbar spine     COMBINED   No definite acute pathology noted within the chest abdomen or pelvis. Groundglass opacities within the right lower lobe could represent infection. Trace left pleural effusion. Jimmye Norman, MD  04/08/2020 6:21 PM    CT Cervical Spine WO Contrast    Result Date: 04/08/2020  HISTORY: Trauma with posttraumatic cervical tenderness. COMPARISON: None. TECHNIQUE: Unenhanced axial images were acquired through the cervical spine. Coronal and sagittal reconstructions were provided. This CT study was performed using radiation dose reduction techniques including one or more of the following: automated exposure control, adjustment of the mA and/or kV according to patient size, and the use of iterative reconstruction technique. FINDINGS: There is grade 1 anterolisthesis at C3-4, C4-5, C7-T1 and T1-T2 due to facet arthrosis with subluxation. The cervical spine is otherwise anatomic in alignment on both coronal and sagittal reconstructions. The relationship of the dens to the ring of C1 is normal and the facet joints are well aligned bilaterally. No acute fracture is identified. No prevertebral soft tissue swelling is evident. Intrinsic discogenic degenerative changes are most notable at C5-6 and C6-7. Hypertrophic facet arthrosis is most notable on the left at C3-4, C4-5 and T1-T2. A coarse calcification within the posterior aspect of the right parotid gland is incidental in nature. Only trace intimal calcification is noted at each carotid bulb/bifurcation. The thyroid gland is atrophic. There is a partially calcified pleural parenchymal scarring at the left lung apex.      No acute cervical spine fracture or traumatic malalignment. Cervical spondylosis and facet arthrosis with degenerative spondylolisthesis as described. Waynard Edwards, MD  04/08/2020 5:48 PM    MRI Brain W WO Contrast    Result Date: 04/09/2020  HISTORY: 87 year old female patient with  seizure. COMPARISON: No prior MR. Correlation made with unenhanced CT the head dated 04/08/2020. TECHNIQUE: On a 3.0 Tesla closed system, the brain was imaged utilizing multiple pulse sequences in orthogonal planes both before and after the uneventful intravenous administration of 17 cc Clariscan. Additional coronal FSE T2 and FLAIR sequences through the temporal lobes and  hippocampal formations supplemented the usual series. FINDINGS: There is generalized cerebral parenchymal volume loss present with prominence of the ventricles and cortical sulci. Moderate to advanced chronic microvascular sequela is also present. No intracranial hemorrhage, midline shift or extra-axial fluid collection is demonstrated. No diffusion restriction or pathologic parenchymal enhancement is present. The medial temporal lobes and hippocampal formations are symmetric in volume, signal and appearance. No heterotopia or migrational abnormality is appreciated. The brainstem and cerebellar hemispheres are unremarkable. The expected venous and arterial flow-voids are present. Generalized soft tissue swelling of the left parietal scalp is present with subcutaneous hematoma formation, similar to that seen on the previous CT. There are bilateral lens implants present from prior cataract extractions. The orbital contents are otherwise unremarkable. The visualized paranasal sinuses and mastoid air cells are clear.      1. No MR evidence of acute intracranial abnormality, infarct, mass, hemorrhage or hydrocephalus. 2. Moderate generalized intraparenchymal volume loss with moderately advanced chronic microvascular sequela. Waynard Edwards, MD  04/09/2020 10:15 PM    CT Abdomen Pelvis W IV/ WO PO Cont    Result Date: 04/08/2020  PROCEDURE:  CT ANGIOGRAM CHEST, CT ABDOMEN PELVIS W IV/ WO PO CONT HISTORY:  Trauma TECHNIQUE: CT of the chest was performed. MIPS images were obtained. CT of the abdomen and pelvis was performed using non-ionic intravenous  contrast.  Omnipaque 350 100 cc IV given. Oral contrast was not administered. COMPARISON:  None FINDINGS CT CHEST:  Support devices: None. Lower cervical region: Unremarkable. Lymph nodes: There is no thoracic adenopathy. Pleural spaces/diaphragm:Trace left pleural effusion. Heart/Pericardium/Great Vessels: Heart is normal in size. No pericardial effusion. Mild coronary calcifications. Aorta is normal in size. Atheromatous disease of the aorta. Main pulmonary artery is normal in caliber. No main, right left, or lobar pulmonary embolism. Lungs: No endobronchial lesion. Biapical pleural-parenchymal thickening. Bibasilar subsegmental atelectasis. There is patchy right lower lobe groundglass opacity. Upper GI tract: The esophagus is grossly unremarkable. Body wall:Chest wall soft tissues are unremarkable. FINDINGS CT ABDOMEN/PELVIS: Liver: Multiple subcentimeter hypodense lesions throughout the liver too small to characterize. Biliary tree: The patient is post cholecystectomy. Mild extrahepatic biliary ductal dilatation with distal tapering likely secondary to postcholecystectomy state. Spleen: The spleen is normal in size. Pancreas:  Atrophic Adrenal glands:  The adrenal glands are normal in size and shape. Kidneys:  There are bilateral symmetric nephrograms without hydronephrosis. Small low density foci in the kidneys are too small to characterize. Lymph nodes: Abdomen: There is no abdominal adenopathy. Pelvis: There is no pelvic adenopathy. Vasculature:  There is no abdominal aortic aneurysm. Atherosclerotic calcification is seen. Peritoneum/mesentery/omentum:  There is no free fluid or free air. GI tract:  There is no bowel obstruction. Pelvic urogenital structures: Bladder is normal. Uterus is present. Body wall:  Abdominal wall soft tissues are unremarkable Bones: Degenerative changes of the spine. Anterior wedge compression deformity of the T7 vertebral body. Please see separate report for further evaluation of  the thoracic and lumbar spine     COMBINED   No definite acute pathology noted within the chest abdomen or pelvis. Groundglass opacities within the right lower lobe could represent infection. Trace left pleural effusion. Jimmye Norman, MD  04/08/2020 6:21 PM     Chest AP Portable    Result Date: 04/08/2020  History: Fall. Trauma Comparison none Technique AP portable semierect FINDINGS: Moderate rotation to the right. No edema consolidation effusion or pneumothorax. Suspected left basilar atelectasis.Marland Kitchen Heart size upper normal in size. Atherosclerotic changes, ectasia with the aortic  arch well defined. There is biapical pleural thickening likely scarring The bones are osteopenic. There is no displaced rib fracture. Degenerative joint changes both shoulders.     . . Mild left basilar atelectasis Marty Heck, MD  04/08/2020 4:44 PM    CT Reconstruction L-spine    Result Date: 04/08/2020  HISTORY: Trauma with posttraumatic low back pain. COMPARISON: None. TECHNIQUE: Small field-of-view axial reconstructions of the lumbar spine were rendered retrospectively from an enhanced abdominal and pelvic CT. Coronal and sagittal reconstructions were provided. This CT study was performed using radiation dose reduction techniques including one or more of the following: automated exposure control, adjustment of the mA and/or kV according to patient size, and the use of iterative reconstruction technique. FINDINGS: There is prominent S-shaped lumbar scoliosis, convex toward the right at the L2-L3 level with roughly 20 degrees of angulation and convex toward the left at the L4-L5 level with roughly 25 degrees of angulation. There is right lateral listhesis of L2 on L3 and L3 on L4 with left lateral listhesis of L4 on L5. There is grade 1 anterolisthesis of L5 on S1. The vertebral body heights are maintained. The facets are well aligned bilaterally. The central canal contents are unremarkable within the limits of this modality.  Intrinsic disc degeneration is present at every level with vacuum phenomenon. Disc space height loss, endplate irregularities, sclerosis and circumferential disc osteophyte formation is most notable at L3-4 and L4-5. There is bilateral hypertrophic facet arthrosis and spurring at many levels. Symmetric degenerative changes are present along the sacroiliac joints including vacuum phenomenon, marginal sclerosis and ventral disc osteophyte formation.      No acute lumbar vertebral fracture, compression deformity or traumatic malalignment. Degenerative spondylosis and facet arthrosis superimposed upon S-shaped rotatory scoliosis, multilevel lateral listhesis and L5-S1 anterolisthesis. Waynard Edwards, MD  04/08/2020 5:52 PM    Pathology:   Specimens (From admission, onward)    None        Pending Lab Results:   Labs/Images to be followed at your PCP office:   Unresulted Labs     None        Hospitalist:   Signed by: Deloris Ping, MD  04/11/2020 3:18 PM  Time spent for discharge: 50 minutes      *This note was generated by the Epic EMR system/ Dragon speech recognition and may contain inherent errors or omissions not intended by the user. Grammatical errors, random word insertions, deletions, pronoun errors and incomplete sentences are occasional consequences of this technology due to software limitations. Not all errors are caught or corrected. If there are questions or concerns about the content of this note or information contained within the body of this dictation they should be addressed directly with the author for clarification

## 2020-04-11 NOTE — SLP Progress Note (Signed)
Speech Language Pathology    [facility]  [address]  [place], [place]. [address]    Department of Rehabilitation Services  [phone]    Speech Language and Pathology Therapy Treatment Note       Patient:  Yolanda Cox MRN#:  [id number]  [facility] M228/M228.A    Time of treatment: SLP Received On: 04/11/20  Start Time: 1215  Stop Time: 1249  Time Calculation (min): 34 min  Treatment # 2    Date of Initial Evaluations: 04/10/2020  Initial Evaluation Recommendations:  Acute Rehab    Precautions and Contraindications:  Falls    Assessment:   *- Pt's speech much more intelligible today as compared to last session on 04/10/2020  -Pt with improved cognitive/language skills re: short term memory, attention, executive function    Plan:    -Continue to suggest inpt rehab (acute) at this point, though pt much more alert and responsive today as compared to yesterday  -Will modify d/c recs. Next session as appropriate         Risks/benefits/POC discussed with RN, pt    Goals:   Time For Goal Achievement: by time of discharge    COGNITIVE (PROGRESSING)  1. Pt will complete functional visual attention tasks with 95% X3  2. Pt will complete funcitonal auditory attention tasks with 95% X3  3. Pt will complete divided /alternating attention tasks with 95% X3   4. Pt will recall complex auditorily presented information with 90% X3  5. Pt wil utilize targeted compensatory strategies to facilitate performance on memory and attention tasks (ie association, visual imagery, note taking, repetition, and rehearsal)  6. Pt will recall daily events with 90% X3  NEW  -Pt will complete basic calculation tasks with 90%X3  -Pt will provide solutions to basi problem solving tasks with ~90%X3           Subjective:   Patient's medical condition is appropriate for Speech Language Pathology Therapy intervention at this time.  Patient is agreeable to participation in the therapy  session.    Objective:   Observation of Patient/Vital Signs:  Patient is in bed with telemetry and peripheral IV in place.    1. Pt will complete functional visual attention tasks with 95% X3  -Pt now able to complete sentence level reading tasks with 90% accuracy; she did have difficulty with tracking for 2-3 sentences but was able to self correct  2. Pt will complete funcitonal auditory attention tasks with 95% X3  -Pt able to follow 1-2 commands with 90% this session but when increased to 3 step commands, pt with decreased ability to follow 3rd command. Repetition and rehearsal improved performance  3. Pt will complete divided /alternating attention tasks with 95% X3   -DNT  4. Pt will recall complex auditorily presented information with 90% X3  -Pt able to listen to 2-3 paragraph excerpts and respond to factual Wh?s with 80%  5. Pt wil utilize targeted compensatory strategies to facilitate performance on memory and attention tasks (ie association, visual imagery, note taking, repetition, and rehearsal)  -Pt able to request repetition and use rehearsal with verbal cues this session  6. Pt will recall daily events with 90% X3  -pt recalled breakfast items and conversation with her daughter from this am.. She continues to have difficulty recalling information with ~5 minute delay    Educated the patient to role of Speech therapy, plan of care, goals of therapy .  Family and patient education includes written and verbal     Patient left without needs and call bell within reach. RN notified of session outcome.      Therapist PPE during session procedural mask, face shield  and gloves      Doralee Albino. Margo Aye, MS CCC-SLP

## 2020-04-11 NOTE — Procedures (Signed)
Service Date: 04/10/2020     Patient Type: I     PHYSICIAN/PROVIDER: Everlean Patterson MD     REFERRING PHYSICIAN:      TITLE OF PROCEDURE:  This is a digital EEG study.  19 channels used.  International standard  10-20 electrode placement.     HISTORY OF PRESENT ILLNESS:  The patient is an 87 year old being evaluated for altered mental status,  some seizure activity.     MEDICATIONS:  Include anti-seizure medication, Keppra.     ELECTROENCEPHALOGRAM DESCRIPTION:  The background is mixture of component.  Predominant background is about 7  or 7.5 Hz medium amplitude.  At times, there is some generalized slowing.   No epileptiform potentials or any definitive focal EEG abnormality that  could be detected in the present study.  At times, there is muscle tension  artifact obscuring underlying EEG activity.     CLINICAL INTERPRETATION:  There is mild background slowing that may indicate drowsiness.  No  epileptiform potentials.  No definitive focal EEG abnormality that could be  detected.  Please note an absence of epileptiform potentials in the EEG  study would not rule out seizure.  Suggest clinical correlation.                    D:  04/10/2020 20:22 PM by Dr. Brantley Stage. Marney Doctor[place] MD ([id number])  T:  04/11/2020 10:08 AM by       Everlean Cherry: [id number]) (Doc ID: [id number])

## 2020-04-11 NOTE — Discharge Instr - AVS First Page (Addendum)
Reason for your Hospital Admission:  New onset Seizure Syncope   Aspiration Pneumonia       Instructions for after your discharge:  Please take medications as prescribed   Follow up with Neurology - Dr Thedore Mins  Continue Incentive Spirometry  Duonebs as needed  F/u CXR in 4-6 weeks for resolution

## 2020-04-11 NOTE — Progress Notes (Signed)
Placed a call to [facility]. As per Mikel Cella, COORDINATOR, [phone], CALL Nurse Sela Hua back in 5 minutes- [phone]

## 2020-04-11 NOTE — Discharge Summary -  Nursing (Signed)
Report given to Nurse Sela Hua at [facility]. Pt's daughter drove her to the facility. Belongings with pt. Paperwork /copy of AVS given to pt's daughter. Wheeled to lobby by [employer].

## 2020-04-11 NOTE — Plan of Care (Signed)
Problem: Moderate/High Fall Risk Score >5  Goal: Patient will remain free of falls  Outcome: Not Progressing     Problem: Safety  Goal: Patient will be free from injury during hospitalization  Outcome: Not Progressing  Goal: Patient will be free from infection during hospitalization  Outcome: Not Progressing     Problem: Pain  Goal: Pain at adequate level as identified by patient  Outcome: Not Progressing     Problem: Side Effects from Pain Analgesia  Goal: Patient will experience minimal side effects of analgesic therapy  Outcome: Not Progressing     Problem: Discharge Barriers  Goal: Patient will be discharged home or other facility with appropriate resources  Outcome: Not Progressing

## 2020-04-11 NOTE — Rehab Liaison Note ([facility]) (Signed)
Yolanda Cox  MRN: [id number]  Account: [id number]  Session Start: 04/11/2020 12:00:00 AM  Session Stop: 04/11/2020 12:00:00 AM    Total Treatment Minutes:  Minutes    Clinical Liaison  Inpatient Rehabilitation Pre Admission Screen    Medical Diagnosis:  Trauma Head Injury Tonic Clonic Seizure Concussion without  loss of consciousness  Rehab Diagnosis: Neurological Condtions  Brain injury  Probable Impairment Group Code: 02.22 Traumatic, closed injury  Demographics:   Age: 87Y   Gender: Female   Name, phone and relationship of contact person:  Contact Name: Baron Hamper  Phone:   [phone]  Relationship:   Child   Guardian/Power of Attorney:   It is unknown whether the patient has a Advertising account planner.   [facility] Medical Record Number: [id number]  Past Medical History: Anemia  Fibromyalgia  Hyperlipidemia  hypothyroid  Osteoporosis  Rheumatoid  TIA (transient ischemic attack)    PSH  CHOLECYSTECTOMY    Prior Level of Functioning:  Mobility:   indpendent  Activities of Daily Living:  indpendent  Cognition:  WNL  Communication:  WNL  Swallowing:  WNL    CARE Tool  Prior Functioning:  Self Care: Patient completed the activities by him/herself, with or without an  assistive device, with no assistance from a helper.  Indoor Mobility: Patient completed the activities by him/herself, with or  without an assistive device, with no assistance from a helper.  Stairs: Patient completed the activities by him/herself, with or without an  assistive device, with no assistance from a helper.  Functional Cognition: Patient needed partial assistance from another person to  complete activities.  Prior Device Use: Walker  Health Conditions: Patient has had two or more falls, or a fall with injury, in  the past year. Patient has not had major surgery during the 100 days prior to  admission.    Social History:  Marital Status: Widowed  Children: 2          Reside:  1 local  Employment Status:   retired  Investment banker, corporate:  NA  Pre-hospital Living Environment: Type of Home: Apartment [address] [place])  Home Layout: Occupational psychologist Shower/Tub: Walk-in shower  Bathroom Toilet: Raised  Bathroom Equipment: Shower chair,Grab bars in shower,Hand-held shower,Grab bars  around toilet  Language:  English  Hand Dominance: Right.    The following information was gathered for consideration and maintenance in the  medical record to substantiate medical necessity for an IRF level of care.    This assessment is being completed by Gwenevere Abbot , BSN, RN . Patient is  currently at [facility] Unit  # [phone] CM Samina [phone].    The patient is being referred and recommended by their physician, Deloris Ping, MD, to be assessed both medically and functionally in regard to  their premorbid functional capacity to determine whether they can benefit from a  rehabilitation level of care offered by our facility.    The following is information regarding the medical complexity and clinical risk  factors that needs to be considered for the appropriate management of the  patient's care and recovery.  Acute Medical Conditions: None at this time.  History of Present Illness:  Yolanda Cox is a 87 y.o. female with a  medical history of anemia fibromyalgia HLD hypothyroid osteoporosis rheumatoid  arthritis TIA and cerebral hemorrhage  who was brought in by EMS to [facility] status  post fall and head injury at the [facility] retirement community. Per EMR,  patient was walking at the retirement community that she is a resident of when  she fell forward striking her forehead on the floor, patient had been reportedly  nauseated prior to the fall. Per EMR, patient had a change in her mental status  while in route to the hospital. At this time patient has a flat affect and very  delayed responses, patient is alert to self and place. Patient reported a  headache and points to the back of her head and reports of a  backache. While in  the ER she had a episode of tonic-clonic seizure as described by her daughter.  Patient has history of traumatic brain bleed in [place] about a year ago.   She also has history of normal pressure hydrocephalus. She lives alone in [place] independent living in a 1 level home with no steps to enter or inside.  Prior to her recent decline she was independent for all mobility and ADLs.  Currently she has min a to mod a for bed mobility and transfers, She waslked 20  feet mod a with a rw. She is min a for grooming. She has mild-moderately  decreased cognitive/linguitci skills characterized by decreased  attention/concentration, short term memory, and executive functioning. The  patient has been working well with therapy services to include PT, OT, and SLP  to address functional mobility, self-care deficit, cognition, and swallowing.  The patient would benefit from an intensive level of rehab along with close  medical management of multiple medical conditions and rehab MD to drive the  rehab process.  The patient is medically stable for IRF level of care.    This patient has been admitted to the inpatient rehab unit to respond to the  COVID - 19 public health emergency under the CMS 1135 waiver to assist with  hospitalization surge and care capacity constraints. This patient does meet  admission criteria to an inpatient rehab level of care, per CMS guidelines,  however their primary diagnosis and reason for admission does not meet 60% rule  criteria.     Date of Onset: 04/09/2020   Date Admitted to Acute:  04/09/2020  Current Precautions:   History of seizures.  Food Allergies  No known food allergies.  Medication Allergies   Augmentin [amoxicillin-pot Clavulanate] High Rash  Biaxin [clarithromycin] High Rash  Zithromax [azithromycin] High Rash Rash.  Other Allergies Not applicable.    Present Systems Summary:  Vital signs: Maximum Temperature (last 48 hrs):  97.6 degrees.  Pulse:  79 beats per  minute.  Respirations:  20 breaths per minute.  Blood Pressure:   136/76 mmHg.  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  Vision: Patient has vision issues. Bilateral eyes.  Other medical comments:   Code:  NO CPR - AND (no ACP docs)  DIET:  Current Diet Texture: Regular diet.  Current Liquid Consistency: Thin liquids.  Type: Regular. Consistent carbohydrate. Heart healthy  Bladder: Patient is continent of bladder.  Bowel: Patient does not have an ostomy.  Date of Last Bowel Movement:  04/07/2019 Patient is continent of bowel.  Integumentary:   Intact.  Cardiopulmonary:   Room Air.  Dialysis: Patient currently is not receiving dialysis.  Current Medication(s): 04/11/2020   amLODIPine (NORVASC) tablet 10 mg 10 mg, PO, Daily Given, 10 mg at 01/11 1047  aspirin tablet 325 mg 325  mg, PO, Daily Given, 325 mg at 01/11 1048  atorvastatin (LIPITOR) tablet 40 mg 40 mg, PO, Daily Given, 40 mg at 01/10 2151  calcium citrate-vitamin D (CITRACAL+D) 315-250 MG-UNIT per tablet 2 tablet 2  tablet, PO, Daily Given, 2 tablet at 01/11 1051  folic acid (FOLVITE) tablet 1 mg 1 mg, PO, Daily Given, 1 mg at 01/11 1050  insulin lispro (HumaLOG) injection 1-5 Units (And Linked Group #1) 1-5 Units,  SC, TID AC Given, 3 Units at 01/10 1255  lactobacillus/streptococcus (RISAQUAD) capsule 1 capsule 1 capsule, PO,  Daily Given, 1 capsule at 01/11 1052  levETIRAcetam (KEPPRA) tablet 500 mg 500 mg, PO, Q12H SCH Given, 500 mg at 01/11  1051  levothyroxine (SYNTHROID) tablet 75 mcg 75 mcg, PO, Daily at 0600 Given, 75 mcg  at 01/11 0558  multivitamin tablet 1 tablet 1 tablet, PO, Daily Given, 1 tablet at 01/11 1052  piperacillin-tazobactam (ZOSYN) 4.5 g in sodium chloride 0.9 % 100 mL IVPB  mini-bag plus 4.5 g, IV, Q6H New Bag, 4.5 g at 01/11 1055  sertraline (ZOLOFT) tablet 50 mg 50 mg, PO, Daily Given, 50 mg at 01/11 1049  vitamin D  (cholecalciferol) tablet 25 mcg 25 mcg, PO, Daily Given, 25 mcg at  01/11 1050      PRN    Medication Ordered Dose/Rate, Route, Frequency Last Action  acetaminophen (TYLENOL) tablet 650 mg 650 mg, PO, Q6H PRN Given, 650 mg at 01/09  2051  albuterol-ipratropium (DUO-NEB) 2.5-0.5(3) mg/3 mL nebulizer 3 mL 3 mL,  NEBULIZATION, Q6H PRN Ordered  butalbital-acetaminophen-caffeine (FIORICET) 50-325-40 MG per tablet 1 tablet 1  tablet, PO, Q4H PRN Given, 1 tablet at 01/10 1715  dextrose (GLUCOSE) 40 % oral gel 15 g of glucose (And Linked Group #2) 15 g of  glucose, PO, PRN Ordered  dextrose 50 % bolus 12.5 g (And Linked Group #2) 12.5 g, IV, PRN Ordered  diphenhydrAMINE (BENADRYL) injection 25 mg 25 mg, IV, Once PRN Ordered  glucagon (rDNA) (GLUCAGEN) injection 1 mg (And Linked Group #2) 1 mg, IM,  PRN Ordered  guaiFENesin (ROBITUSSIN) oral solution 200 mg 200 mg, PO, Q4H PRN Ordered  LORazepam (ATIVAN) injection 2 mg 2 mg, IV, Q8H PRN Ordered  melatonin tablet 3 mg 3 mg, PO, QHS PRN Ordered  methylPREDNISolone sodium succinate (Solu-MEDROL) injection 125 mg 125 mg, IV,  Once PRN Ordered  naloxone (NARCAN) injection 0.2 mg 0.2 mg, IV, PRN Ordered  ondansetron (ZOFRAN) injection 4 mg 4 mg, IV, Q8H PRN Given, 4 mg at 01/09 0711  Current Alcohol Use:  Patient does not consume alcohol  Current Tobacco Use:  No, patient does not use tobacco  Current Drug Use: No, patient does not use recreational drugs.  Pain: Patient currently without complaints of pain.    Additional medical documentation contributing to the expected care of this  patient may be noted in the following areas:  Laboratory-Chemistry/Hematology: 04/11/2020   WBC: 11.34 (H)  Hemoglobin: 10.4 (L)  Hematocrit: 31.5 (L)  Platelet Count: 164  RBC: 3.36 (L)  MCV: 93.8  MCH: 31.0  MCHC: 33.0  RDW: 16 (H)  MPV: 10.5  Nucleated RBC: 0.0  Nucleated RBC Absolute: 0.00  Glucose: 226 (H)  BUN: 15.4  Creatinine: 0.8  Sodium: 141  Potassium: 3.8  Chloride: 106  CO2: 24  Calcium:  8.7  Anion Gap: 11.0  EGFR: >60.0  Cultures:   Blood: 04/10/20   No Growth after 1 day/s of incubation  Radiology:   CT  Reconstruction T-spine    Result Date: 04/08/2020   Mild anterior wedge compression fracture at T7 is age indeterminate, but has  imaging features of chronicity. No definitive acute thoracic vertebral fracture,  compression deformity or traumatic malalignment. Diffuse thoracic spondylosis.  Waynard Edwards, MD  04/08/2020 6:00 PM    CT Head without  Contrast    Result Date: 04/08/2020   1. No CT evidence of acute intracranial abnormality. 2. Cerebral volume loss,  intracranial atherosclerosis and moderate sequela of chronic small vessel  ischemic disease. Waynard Edwards, MD  04/08/2020 5:03 PM    CT Angiogram Chest    Result Date: 04/08/2020  COMBINED   No definite acute pathology noted within the chest abdomen or pelvis.  Groundglass opacities within the right lower lobe could represent infection.  Trace left pleural effusion. Jimmye Norman, MD  04/08/2020 6:21 PM    CT Cervical Spine WO Contrast    Result Date: 04/08/2020   No acute cervical spine fracture or traumatic malalignment. Cervical  spondylosis and facet arthrosis with degenerative spondylolisthesis as  described. Waynard Edwards, MD  04/08/2020 5:48 PM    MRI Brain W WO Contrast    Result Date: 04/09/2020   1. No MR evidence of acute intracranial abnormality, infarct, mass, hemorrhage  or hydrocephalus. 2. Moderate generalized intraparenchymal volume loss with  moderately advanced chronic microvascular sequela. Waynard Edwards, MD  04/09/2020 10:15 PM    CT Abdomen Pelvis W IV/ WO PO Cont    Result Date: 04/08/2020  COMBINED   No definite acute pathology noted within the chest abdomen or pelvis.  Groundglass opacities within the right lower lobe could represent infection.  Trace left pleural effusion. Jimmye Norman, MD  04/08/2020 6:21 PM     Chest AP Portable    Result Date: 04/08/2020  . Marland Kitchen Mild left basilar atelectasis Marty Heck, MD   04/08/2020 4:44 PM    CT Reconstruction L-spine    Result Date: 04/08/2020   No acute lumbar vertebral fracture, compression deformity or traumatic  malalignment. Degenerative spondylosis and facet arthrosis superimposed upon  S-shaped rotatory scoliosis, multilevel lateral listhesis and L5-S1  anterolisthesis. Waynard Edwards, MD  04/08/2020 5:52 PM  IVs:  IV Access: No IV access.    Is patient presently participating in rehabilitation? Yes  Adjustment to Present Illness: Patient is coping adequately.   Patient is accepting limitations adequately.   Patient's expectations are realistic.   Patient is motivated.  Activity Tolerance:  Good.  SPECIAL NEEDS: None.   Are you currently experiencing fever and/or symptoms of acute respiratory  illness (such as cough, difficulty breathing)? NO  Do you have a history of travel from affected geographic areas within 14 days of  symptom onset? NO  Did you have close contact with a laboratory confirmed COVID-19 person? NO  Does the person reside in a nursing home or long-term care facility or assisted  living facility? NO  Was the patient ever tested for COVID? YES  Document COVID test results/date: Negative on 04/09/2020    Current Functional Status  Weight-bearing Status:   No Restrictions  Mobility: 04/10/2020  Supine to Sit: Moderate Assist;HOB raised, assistance for trunk elevation and LE  management  Sit to Stand: Moderate Assist (Heavy R lean upon standing)  Stand to Sit: Moderate Assist  Sit to Supine: Min A  Locomotion  Ambulation: Moderate Assist (with B handheld assistance)  Pattern: decreased step length;decreased cadence (R lean with instruction to  correct. Heavily pushing through RUE for stability.  Small, short steps. Improved  when ambulating back to bed from bathroom.)  Distance Walked (ft) (Step 6,7): 20 Feet  PMP Activity: Step 6 - Walks in Room  Activities of Daily Living: 04/10/2020  Grooming: Minimal Assist;standing at sink;steadying;verbal prompting;wash/dry  hands  (occasional R lateral leaning requring verbal and tactile instructions to  increase weightbearing thru LLE with B UE support)  Cognition: 04/10/2020 Follows commands. Safety awareness. Insight. Problem  solving. Memory.  Communication: 04/10/2020 Dysarthria.  Swallowing:  04/10/2020   No swallowing deficits present.  Other Impairments: Gait abnormality    Patient is able to understand and make healthcare decisions  No, discussed  information with SULLENDER,JILL  Payer Source: Level of care will be discussed with the following payer sources  if/when applicable.  Primary: Medicare A  Secondary: UNITED HEALTHCARE-UHC AARP MEDICARE SUPP    Information and Case Discussion:   Rehabilitation risks/benefits were reviewed.  Patient/family/caregiver agrees to/accepts rehabilitation risks/benefits.   Rehab literature/brochure was not provided at this time.  Case will be  discussed with Physician/Medical Director.        IRF Admission Approval/Non-Approval  Appropriateness for admission to the Inpatient Rehabilitation Facility:  The  patient's condition is sufficiently stable to allow active participation in an  intensive interdisciplinary inpatient rehabilitation program. The patient would  benefit from interdisciplinary inpatient rehabilitation provided by a physician,  rehab-focused nursing, and a minimum of two rehab therapies which will provide  specialized care for the following functional deficits:   Bladder Management.  Bowel Management.  Cognition  Communication  Leisure Skills  Mobility  Safety Risk  Self Care Management  Comorbid conditions present at pre-admission:  The interdisciplinary team will also manage the potential risks and  complications from the following comorbid conditions:   Diabetes  Recommended services:  The recommended interdisciplinary team will be comprised of the following  services:   Medical Supervision.  24 Hour Rehabilitation Nursing.  Physical Therapy.  Occupational Therapy.  Speech  Therapy.  Therapeutic Recreation.  Registered Dietitian.  Patient's expected intensity and frequency of participation in the  interdisciplinary rehabilitation program: is 3 hours of therapy 5 days/week.  Prognosis and level of expected improvement with inpatient rehabilitation stay  is:   The patient will likely progress with course of intensive inpatient acute  rehabilitation and be discharged to home with assistance from spouse and/or  family members.  It is anticipated the patient will achieve level of mod I with  least restrictive device with supervision.  The patient will require intensive  occupational therapy and physical therapy to work on bed mobility, transfers,  gait, strengthening, range of motion, balance, endurance, coordination, basic  ADLs, and safety.  The patient will have speech therapy for motor speech,  language, swallowing and cognitive linguistics.  The patient will have 24 hour  rehab nursing to monitor vital signs, administer medications and education,  skin/wound infection prevention, pain control, bowel and bladder, bladder scans  if necessary, fall prevention, and reinforcement of therapy strategies.  Patient  will have case management for discharge planning and team coordination.  Patient  will have registered dietician for weight management as needed.  Patient?s  family will be scheduled for family/patient education.  Patient will have  therapeutic recreation for community re-entry and adaptive equipment.  Patient  will have frequent visits by physiatry who will assess, plan, implement, and  evaluate the individualized rehabilitation program for them.  Estimated date of admission to acute inpatient:   04/11/2020  Estimated length of inpatient rehabilitation  stay in order to achieve rehab  medical/functional goals:   10 days  Anticipated destination post discharge from inpatient rehabilitation is:   community discharge with assistance.    Anticipate patient will need the following  services post discharge from  inpatient rehabilitation: Home health therapy. Home health nursing.    Physician Approval Status of Admission: Admission Approval:  The patient's  condition is sufficiently stable to allow active participation in an intensive  interdisciplinary inpatient rehabilitation program. The pt needs IRF level of  care for safe, effective and efficient restoration of function with daily MD  e/T/m for VS, labs, cardiopulm and neuro status.Marland KitchenMarland KitchenPt and family education and  training is needed for safe d/c planning....    This assessment denotes that on admission to the inpatient rehabilitation  facility, our physician will provide documentation that demonstrates clinical  rehabilitation complications for which the patient is at risk and a specific  plan to avoid those risks. Further, the medical conditions present create  possible adverse conditions that predictably can be controlled through an  intensive rehabilitation plan of care to be outlined at admission.    Department of Medical Assistance Services [facility])  Intensive Rehabilitation Admission Certification    I. Certification Statement:    In accordance with 42 CFR 456.60, I certify that Yolanda  Cox  meets the  admission criteria for intensive rehabilitation services set forth in 12 VAC  30-60-120.    II. Criteria Determination:  (In order to meet intensive rehabilitation criteria, the recipient must require  all the items listed below)    The rehabilitation cannot be safely and adequately carried out in a less  intensive setting; and    The interdisciplinary coordinated team approach is required; and    The recipient requires at least 2 of the the four therapies:    Physical Therapy services on a daily basis  Occupational Therapy services on a daily basis    III. Physician Signature Required:    Signed by: Gwenevere Abbot, BSN, RN 04/11/2020 2:27:00 PM    Physician CoSigned By: Candie Echevaria 04/11/2020 15:18:35

## 2020-04-11 NOTE — Progress Notes (Signed)
Assessment done earlier, Pt lethargic but easily aroused , Pt denies pain or acute distress. Neuro check stable, POC and safety plan reviewed with pt, Pt resting in bed, call bell within reach. Will continue to monitor.

## 2020-04-11 NOTE — Rehab Evaluation ([facility]) (Signed)
NAMECHARISSE Cox  MRN: [id number]  Account: [id number]  Session Start: 04/11/2020 12:00:00 AM  Session Stop: 04/11/2020 12:00:00 AM    Total Treatment Minutes:  Minutes    Rehabilitation Nursing  Inpatient Rehabilitation Admission Assessment - Functional Status and Care Plan    Rehab Diagnosis: Neurological Condtions  Brain injury  Demographics:            Age: 22Y            Gender: Female            Date of Onset: 04/09/2020            Date of Admission: 04/11/2020 5:40:00 PM  Primary Language: Len[place] Ponds    Past Medical History: Anemia  Fibromyalgia  Hyperlipidemia  hypothyroid  Osteoporosis  Rheumatoid  TIA (transient ischemic attack)    PSH  CHOLECYSTECTOMY  History of Present Illness: Yolanda Cox is a 87 y.o. female with a  medical history of anemia fibromyalgia HLD hypothyroid osteoporosis rheumatoid  arthritis TIA and cerebral hemorrhage  who was brought in by EMS to [facility] status  post fall and head injury at the [employer] retirement community. Per EMR,  patient was walking at the retirement community that she is a resident of when  she fell forward striking her forehead on the floor, patient had been reportedly  nauseated prior to the fall. Per EMR, patient had a change in her mental status  while in route to the hospital. At this time patient has a flat affect and very  delayed responses, patient is alert to self and place. Patient reported a  headache and points to the back of her head and reports of a backache. While in  the ER she had a episode of tonic-clonic seizure as described by her daughter.  Patient has history of traumatic brain bleed in [place] about a year ago.   She also has history of normal pressure hydrocephalus. She lives alone in [place] independent living in a 1 level home with no steps to enter or inside.  Prior to her recent decline she was independent for all mobility and ADLs.  Currently she has min a to mod a for bed mobility and transfers, She waslked 20  feet  mod a with a rw. She is min a for grooming. She has mild-moderately  decreased cognitive/linguitci skills characterized by decreased  attention/concentration, short term memory, and executive functioning. The  patient has been working well with therapy services to include PT, OT, and SLP  to address functional mobility, self-care deficit, cognition, and swallowing.  The patient would benefit from an intensive level of rehab along with close  medical management of multiple medical conditions and rehab MD to drive the  rehab process.  The patient is medically stable for IRF level of care.  Social History:  Marital Status: Widowed  Children: 2          Reside:  1 local  Employment Status:  retired  Investment banker, corporate:  NA    Rehabilitation Precautions Restrictions:   Fall precaution, seizure precaution    Orientation to Rehabilitation: Patient and Family were given instructions and  information on hospital orientation. Orientation was provided for the following  areas: orientation checklist, rehab handbook, bed controls, call light,  chaplain, daily routine, meal times, phone and phone numbers, and visiting  hours., Orientation checklist, Rehab handbook, Bed controls, Call light, Daily  routine, Meal times, Phone and phone numbers, Visiting hours  Patient/Caregiver Goals:  Patient and family's functional goals for patient: "  To get stronger"    Wounds/Incisions: See Epic    Medication Review: No clinically significant medication issues identified this  shift.    CARE Tool  The scores below reflect the patient's usual function prior to therapeutic  intervention.    Self-Care Functional Assessment  Eating: 05 - Setup or Clean Up Assistance: Helper sets up or cleans up prior to  or following an activity  Assistive Device(s):  None    Oral Hygiene: 05 - Setup or Clean Up Assistance: Helper sets up or cleans up  prior to or following an activity  Assistive Device(s):   None, Built Up Handle  Context for oral  hygiene:   Seated    Toileting Hygiene: 02 - Substantial/Maximal Assistance: Helper does 51-75% of  the activity  Toileting Equipment:   Bedpan  Assistive Device(s):   None    Mobility Functional Assessment  Roll Left and Right: 03 - Partial/Moderate Assistance: Helper does 26-50% of the  activity  Assistive Device(s):   None    Sit to Lying: 02 - Substantial/Maximal Assistance: Helper does 76-99% of the  activity  Assistive Device(s):   None    Lying to Sitting on Side of Bed: 02 - Substantial/Maximal Assistance: Helper  does 76-99% of the activity  Assistive Device(s):   None    Sit to Stand: 02 - Substantial/Maximal Assistance: Helper does 76-99% of the  activity  Assistive Device(s):   None    Chair/Bed-to-Chair Transfer: Transfering to and from a bed were  observed/assessed. 02 - Substantial/Maximal Assistance: Helper does 76-99% of  the activity  Assistive Device(s):   None    Toilet Transfer: 02 - Substantial/Maximal Assistance: Helper does 76-99% of the  activity  Assistive Device(s):   None    Special Treatments, Procedures, and Programs: Patient did not receive total  parenteral nutrition treatment at the time of admission.    Bladder and Bowel Counts:                       Bladder (# only)             Bowel (# only)  Number of Episodes  Continent            3                            0  Incontinent          1                            0    Interdisciplinary Educational Needs and Learning Preferences:       Learning Preference: The patient's preferred learning method is:  Explanation.  The patient's preferred learning method is: Demonstration.  The patient's preferred learning method is: Programme researcher, broadcasting/film/video.       Barriers to Learning: Acuity of illness.  Hearing deficits.  Mobility.  Visual deficits.       Learning Needs: None., Medical management, Pain management, Plan of care,  Precautions, Rehabilitation techniques and procedures, Safety, Skin/wound care    Education Provided: Precautions. Pain  management. Pain scale. Medication  options. Side effects. Clinical indicators of pain. Plan of care. Safety issues  and interventions. Fall protocol. Supervision requirements. Incentive  spirometry. Breathing exercises. Activities of daily living. Safety. Wheelchair  mobility. Gait. Medication. Administration. Name  and dosage. Purpose. Side  Effects. Interaction. Labs. Anticoagulants. Cardiac. Nutrition and lifestyle  changes. Monitoring blood pressure. Monitoring heart rate. Diabetes. Medication.  Glucose testing. Insulin drawing/mixing/administration. Skin care. Effects on  wound healing. Diabetic foot care. Diet management. Hypoglycemia  causes/symptoms/treatment. Hyperglycemia causes/symptoms/treatment. Weight  management.  Brain Injury: Seizures       Audience: Patient.       Mode: Explanation.  Demonstration.  Teacher, English as a foreign language provided.       Response: Applied knowledge.  Verbalized understanding.  Needs reinforcement.    Rehab Potential: Good family/social support, Motivated  Barriers to Progress/Discharge: Medical condition    TEAM CARE PLAN  Identified problems from team documentation:      Identified problems from this assessment:     Safety Risk : Patient will recognized limitations and call for assistance 100%  of the time before getting out of bed.    Discipline:  Nursing    Please review Integrated Patient View Care Plan Flowsheet for Team identified  Problems, Interventions, and Goals.    Signed by: Brandon Melnick, RN 04/11/2020 7:00:00 PM

## 2020-04-11 NOTE — Progress Notes (Signed)
Pt arrived in the unit accompanied by her daughter around 29 ,came from [facility] admission care done.Pt and family oriented to the room and how to use callbell for an assistance,bed alarm on,callbell nearby.Pt looks so tired and sleepy but arousable,alert oriented x3. Skin admission assessment done with Stark Klein RN supervisor,with bruising both arm,with abrasion ,redness left scalp.                    or.

## 2020-04-11 NOTE — Progress Notes (Signed)
Jill/daughter's first choice was [facility].  Caryn Bee foos confirmed they can accept the patient. Daughter not sure if she wants home health or acute rehab, daughter will visit the patient this afternoon and will make the decision after that.   CM to follow up.

## 2020-04-11 NOTE — Progress Notes (Signed)
Patient will be going to [facility], room Room 304 report [phone]) [phone]  Daughter will drive the patient.   Notified bedside nurse.

## 2020-04-11 NOTE — Progress Note - Problem Oriented Charting Notewrit (Signed)
Progress Note neurology    Yolanda Cox ZOX:[id number]:[id number] is a 87 y.o. female,   No chief complaint on file.    04/11/2020   Principal Problem:    Debility    Past Medical History:   Diagnosis Date    Anemia     Fibromyalgia     Hyperlipidemia     hypothyroid     Osteoporosis     Rheumatoid     TIA (transient ischemic attack)          Subjective:   Yolanda Cox today reports no significant headache.  No passing out  No any seizure activity    Objective:   Blood pressure 145/74, pulse 70, temperature 98.1 F (36.7 C), temperature source Oral, resp. rate 16, height 1.651 m (5\' 5" ), weight 71.3 kg (157 lb 3 oz), SpO2 98 %.    Intake/Output Summary (Last 24 hours) at 04/11/2020 2219  Last data filed at 04/11/2020 1827  Gross per 24 hour   Intake 50 ml   Output 0 ml   Net 50 ml     Scheduled Meds:  Current Facility-Administered Medications   Medication Dose Route Frequency    [START ON 04/12/2020] amLODIPine  10 mg Oral Daily    amoxicillin-clavulanate  1 tablet Oral Q12H SCH    [START ON 04/12/2020] aspirin  325 mg Oral Daily    atorvastatin  40 mg Oral QHS    [START ON 04/12/2020] calcium citrate-vitamin D  2 tablet Oral Daily    [START ON 04/12/2020] folic acid  1 mg Oral Daily    insulin lispro  1-3 Units Subcutaneous QHS    insulin lispro  1-5 Units Subcutaneous TID AC    [START ON 04/12/2020] lactobacillus/streptococcus  1 capsule Oral Daily    levETIRAcetam  500 mg Oral Q12H SCH    [START ON 04/12/2020] levothyroxine  75 mcg Oral Daily at 0600    [START ON 04/12/2020] multivitamin  1 tablet Oral Daily    [START ON 04/12/2020] sertraline  50 mg Oral Daily    [START ON 04/12/2020] vitamin D  25 mcg Oral Daily     Continuous Infusions:  PRN Meds:.acetaminophen, albuterol-ipratropium, butalbital-acetaminophen-caffeine, Nursing communication: Adult Hypoglycemia Treatment Algorithm **AND** dextrose **AND** dextrose **AND** glucagon (rDNA), magnesium hydroxide, melatonin, ondansetron,  senna-docusate  Exam  She was awake and alert sitting in hospital chair  No significant neurological change but does appear more sharp  Follow simple command  Speech intact.   Visual fields seem intact  Pupil round and reactive at 3-4 mm.  Extra occular movements intact  No nystagmus,  No facial weakness.   Move extremities upper and lower, fair grip.  Supple Neck  Chest wall move symmetrical  Normal 1st, and 2nd heart sound.  Abdomen: not distended  Extremities:No Cyanosis, edema  CBC w Diff:   Lab Results   Component Value Date    WBC 11.34 (H) 04/11/2020    HGB 10.4 (L) 04/11/2020    HCT 31.5 (L) 04/11/2020    PLT 164 04/11/2020     CMP: @BRIEFLAB (NA,K,CL,CO2,BUN,CREATININE,GLU,PROT,ALBUMIN,  CA,BILITOT,ALKPHOS,AST,ALT,GLO)@   Cardiac markers: No results found for: CKMB, MYOGLOBIN   BNP: No results found for: BNP         No results found for: LIPASE   Radiology Reports  Radiology imaging reviewed  No results found.  Significant Tests:  See full reports for all details   @RISRSLT   Assessment & Plan   87 year old femalewas sent from [facility] yesterday after  she had a syncopal episode. Patient informs she remembers feeling nauseous and was walking to the dinner to ask for help but passed out before that. While in the ER she had a episode of tonic-clonic seizure as described by her daughter. Patient has history of traumatic brain bleed in [place] about a year ago. She also has history of normal pressure hydrocephalus  New onset seizure  Syncopal episode  Fall with scalp abrasion  Started on Keppra 500 twice daily   Possible aspiration pneumonia-possibly related to seizures  Closed head injury after a fall  CT head shows no evidence of acute intracranial abnormality  Mild anterior wedge compression fracture of T7  Hyperlipidemia-continue Lipitor  Hypothyroidism-continue Synthroid  History of rheumatoid arthritis-on methotrexate weekly  History of osteoporosis-continue Os-Cal and vitamin D  Patient has  some memory impairments also bladder incontinence with shuffling gait features ofnormal pressure hydrocephalus.Patient and daughter are very well aware of this condition and apparently in the past has not seek any surgical intervention  MRI of head will be obtained  I have discussed case with the hospitalist attending and haverecommended antiseizuremedication Keppra.  Occupational and physical therapy assistance  Reviewed notes and agree with present plans  04/10/2020 ,no significant neurological change  Patient resting comfortably  MRI of the head reviewed, No MR evidence of acute intracranial abnormality, infarct, mass,  hemorrhage or hydrocephalus.  Moderate generalized intraparenchymal volume loss with moderately  advanced chronic microvascular sequela.  Patient daughter was updated about MRI report  EEG was done, would review  Notes reviewed agree with plans  April 11, 2020 patient reports doing better  She was awake and alert  Neurologically stable.  Reviewed EEG no epileptiform potential  Please note an absence of epileptic potential in the EEG would not rule out seizure disorder.  Continue present medication stable for discharge

## 2020-04-12 DIAGNOSIS — R5381 Other malaise: Principal | ICD-10-CM

## 2020-04-12 DIAGNOSIS — R561 Post traumatic seizures: Secondary | ICD-10-CM

## 2020-04-12 LAB — CBC AND DIFFERENTIAL
Absolute NRBC: 0 10*3/uL (ref 0.00–0.00)
Hematocrit: 32.1 % — ABNORMAL LOW (ref 34.7–43.7)
Hgb: 10.5 g/dL — ABNORMAL LOW (ref 11.4–14.8)
MCH: 30.6 pg (ref 25.1–33.5)
MCHC: 32.7 g/dL (ref 31.5–35.8)
MCV: 93.6 fL (ref 78.0–96.0)
MPV: 10.9 fL (ref 8.9–12.5)
Nucleated RBC: 0 /100 WBC (ref 0.0–0.0)
Platelets: 177 10*3/uL (ref 142–346)
RBC: 3.43 10*6/uL — ABNORMAL LOW (ref 3.90–5.10)
RDW: 16 % — ABNORMAL HIGH (ref 11–15)
WBC: 8.42 10*3/uL (ref 3.10–9.50)

## 2020-04-12 LAB — BASIC METABOLIC PANEL
Anion Gap: 8 (ref 5.0–15.0)
BUN: 11 mg/dL (ref 7.0–19.0)
CO2: 26 mEq/L (ref 21–29)
Calcium: 9.4 mg/dL (ref 7.9–10.2)
Chloride: 106 mEq/L (ref 100–111)
Creatinine: 0.7 mg/dL (ref 0.4–1.5)
Glucose: 164 mg/dL — ABNORMAL HIGH (ref 70–100)
Potassium: 3.3 mEq/L — ABNORMAL LOW (ref 3.5–5.1)
Sodium: 140 mEq/L (ref 136–145)

## 2020-04-12 LAB — GLUCOSE WHOLE BLOOD - POCT
Whole Blood Glucose POCT: 212 mg/dL — ABNORMAL HIGH (ref 70–100)
Whole Blood Glucose POCT: 273 mg/dL — ABNORMAL HIGH (ref 70–100)
Whole Blood Glucose POCT: 244 mg/dL — ABNORMAL HIGH (ref 70–100)
Whole Blood Glucose POCT: 247 mg/dL — ABNORMAL HIGH (ref 70–100)

## 2020-04-12 LAB — MAN DIFF ONLY
Atypical Lymphocytes %: 1 %
Atypical Lymphocytes Absolute: 0.08 10*3/uL — ABNORMAL HIGH (ref 0.00–0.00)
Band Neutrophils Absolute: 0.17 10*3/uL (ref 0.00–1.00)
Band Neutrophils: 2 %
Basophils Absolute Manual: 0.08 10*3/uL (ref 0.00–0.08)
Basophils Manual: 1 %
Eosinophils Absolute Manual: 0.17 10*3/uL (ref 0.00–0.44)
Eosinophils Manual: 2 %
Lymphocytes Absolute Manual: 1.26 10*3/uL (ref 0.42–3.22)
Lymphocytes Manual: 15 %
Metamyelocytes Absolute: 0.17 10*3/uL — ABNORMAL HIGH
Metamyelocytes: 2 %
Monocytes Absolute: 1.52 10*3/uL — ABNORMAL HIGH (ref 0.21–0.85)
Monocytes Manual: 18 %
Myelocytes Absolute: 0.59 10*3/uL — ABNORMAL HIGH
Myelocytes: 7 %
Neutrophils Absolute Manual: 4.38 10*3/uL (ref 1.10–6.33)
Segmented Neutrophils: 52 %

## 2020-04-12 LAB — CELL MORPHOLOGY
Cell Morphology: ABNORMAL — AB
Platelet Estimate: NORMAL

## 2020-04-12 LAB — GFR: EGFR: >60

## 2020-04-12 LAB — HEMOLYSIS INDEX: Hemolysis Index: 6 (ref 0–24)

## 2020-04-12 MED ORDER — POTASSIUM CHLORIDE CRYS ER 20 MEQ PO TBCR
40.0000 meq | EXTENDED_RELEASE_TABLET | Freq: Once | ORAL | Status: AC
Start: 2020-04-12 — End: 2020-04-12
  Administered 2020-04-12: 10:00:00 40 meq via ORAL
  Filled 2020-04-12: qty 2

## 2020-04-12 NOTE — Progress Notes (Signed)
MEDICINE NEW CONSULT    Date Time: 04/12/20 11:19 AM  Patient Name: Yolanda Cox  Requesting Physician: Charlestine Massed, MD  Consulting Physician: Vernell Barrier, MD, MD    Primary Care Physician: Shelbie Proctor, MD    Reason for Consultation: Medical comanagement      Assessment:     Patient Active Problem List    Diagnosis Date Noted    Seizure after head injury 04/11/2020    Debility 04/11/2020    Fall, initial encounter 04/09/2020           Recommendations:   No change in medications.  Continue PT/OT    Charlestine Massed, MD, thank you for this consultation.  We will follow the patient with you during this hospitalization.  Please contact me with any questions or issues.    History of Presenting Illness:   Yolanda Cox is a 18 [name] 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  He lives at [facility] retirement community in [place].  The day of admission on 04/09/2020 she felt nauseated and she to walk to the dining area to seek help but passed that before she got there.  Was taken to [facility] emergency department and while there she had a tonic-clonic.  A CT scan of the head shows sequelae of microvascular disease but no acute abnormalities.  CT scan of the spine showed no acute lesions.  She was started on Keppra, and was treated with Zosyn for possible aspiration pneumonia.  She was discharged to acute rehab center on 04/12/2019.    Review of systems:  Denies HA,visual or ENT problems. No SOB, cough or chest pain. No N,V,abdominal pain,    Medications at discharge include:  Keppra 500 mg twice daily, 01/06/1972-12 1 tablet every 12 hours for 5 days.,  Amlodipine 10 mg daily, aspirin 325 mg daily, Lipitor 40 mg daily, Citracal plus D, lactobacillus, levothyroxine, potassium chloride, Zoloft,  Methotrexate 2.5 mg once a week.    Vital signs on admission.:  Temperature normal, pulse was 60,  respirations 18, pressure 152/74.    Lab results on 04/12/2020:  WBC 8.42, H&H 10.5 and 32.1, platelets 177.  RBC indices are normal.  Glucose 164, BUN and creatinine 11 and 0.5, sodium 140, potassium 3.3, chloride 106 bicarbonate 26, calcium 9.4  .    Past Medical History:     Past Medical History:   Diagnosis Date    Anemia     Fibromyalgia     Hyperlipidemia     hypothyroid     Osteoporosis     Rheumatoid     TIA (transient ischemic attack)        Available old records reviewed, including: Epic    Past Surgical History:     Past Surgical History:   Procedure Laterality Date    CHOLECYSTECTOMY         Family History:   No family history on file.    Social History:     Social History     Tobacco Use   Smoking Status Former Smoker   Smokeless Tobacco Not on file     Social History     Substance and Sexual Activity   Alcohol Use Not Currently     Social History     Substance and Sexual Activity   Drug Use Never       Allergies:     Allergies   Allergen Reactions  Biaxin [Clarithromycin] Rash    Zithromax [Azithromycin] Rash       Medications:     Home Medications     Med List Status: Complete Set By: Wynetta Fines, RN at 04/11/2020  6:46 PM                albuterol-ipratropium (DUO-NEB) 2.5-0.5(3) mg/3 mL nebulizer     Take 3 mLs by nebulization every 6 (six) hours as needed (Shortness of breath or wheezing)     amLODIPine (NORVASC) 10 MG tablet     Take 1 tablet (10 mg total) by mouth daily     amoxicillin-clavulanate (AUGMENTIN) 875-125 MG per tablet     Take 1 tablet by mouth 2 (two) times daily for 5 days     aspirin 325 MG tablet     Take 325 mg by mouth daily     atorvastatin (LIPITOR) 40 MG tablet     Take 40 mg by mouth daily     butalbital-acetaminophen-caffeine (FIORICET) 50-325-40 MG per tablet     Take 1 tablet by mouth every 4 (four) hours as needed for Headaches     calcium carbonate (OS-CAL) 600 MG Tab tablet     Take 600 mg by mouth daily     folic acid (FOLVITE) 1 MG tablet     Take 1  mg by mouth daily     guaiFENesin (ROBITUSSIN) 100 MG/5ML Solution     Take 10 mLs (200 mg total) by mouth every 4 (four) hours as needed (cough)     insulin lispro (HumaLOG) 100 UNIT/ML injection     If BG >180 add the following: 181 - 200 - 2 unit 201 - 250 - 3 unit 251 - 300 - 4 unit 301 - 350 - 5 unit >350 give additional 10 unit     lactobacillus/streptococcus (RISAQUAD) Cap     Take 1 capsule by mouth daily     levETIRAcetam (KEPPRA) 500 MG tablet     Take 1 tablet (500 mg total) by mouth every 12 (twelve) hours     levothyroxine (SYNTHROID) 75 MCG tablet     Take 75 mcg by mouth Once a day at 6:00am     methotrexate 2.5 MG tablet     Take by mouth once a week     Multiple Vitamin (multivitamin) capsule     Take 1 capsule by mouth daily     Omega-3 Fatty Acids (OMEGA-3 FISH OIL PO)     Take 1,000 mg by mouth daily     sertraline (ZOLOFT) 50 MG tablet     Take 50 mg by mouth daily     silver sulfADIAZINE (SILVADENE) 1 % cream     Apply topically daily     vitamin D (cholecalciferol) 25 MCG (1000 UT) tablet     Take 25 mcg by mouth daily          Current Facility-Administered Medications   Medication Dose Route Frequency    amLODIPine  10 mg Oral Daily    amoxicillin-clavulanate  1 tablet Oral Q12H [facility]    aspirin  325 mg Oral Daily    atorvastatin  40 mg Oral QHS    calcium citrate-vitamin D  2 tablet Oral Daily    folic acid  1 mg Oral Daily    insulin lispro  1-3 Units Subcutaneous QHS    insulin lispro  1-5 Units Subcutaneous TID AC    lactobacillus/streptococcus  1 capsule Oral Daily  levETIRAcetam  500 mg Oral Q12H [facility]    levothyroxine  75 mcg Oral Daily at 0600    multivitamin  1 tablet Oral Daily    potassium chloride  40 mEq Oral Once    sertraline  50 mg Oral Daily    vitamin D  25 mcg Oral Daily            Review of Systems:   All other systems were reviewed and are negative except as noted.    Physical Exam:     Patient Vitals for the past 24 hrs:   BP Temp Temp src Pulse Resp SpO2  Height Weight   04/12/20 0848 152/76 -- -- 63 18 95 % -- --   04/12/20 0516 152/69 98.1 F (36.7 C) Oral 69 18 94 % -- --   04/11/20 1900 145/74 98.1 F (36.7 C) Oral 70 16 98 % -- --   04/11/20 1745 152/74 98.4 F (36.9 C) Oral 65 -- 97 % 1.651 m (5\' 5" ) 71.3 kg (157 lb 3 oz)     Body mass index is 26.16 kg/m.    Intake/Output Summary (Last 24 hours) at 04/12/2020 1119  Last data filed at 04/11/2020 2000  Gross per 24 hour   Intake 170 ml   Output 0 ml   Net 170 ml       General: awake, alert,  no acute distress, slow to resond.  HEENT: perrla, eomi, sclera anicteric  oropharynx clear without lesions, mucous membranes moist  Neck: supple, no lymphadenopathy, no thyromegaly,  no carotid bruits  Cardiovascular: regular rate and rhythm, no murmurs, rubs or gallops  Lungs: clear to auscultation bilaterally, without wheezing, rhonchi, or rales  Abdomen: soft, non-tender, non-distended; no palpable masses,  normoactive bowel sounds, no rebound or guarding  Extremities: no clubbing, cyanosis, or edema  Neuro: A+O x 3, cranial nerves grossly intact, strength 5/5 in upper extremities, sensation intact,   Skin: no rashes or lesions noted        Labs:     Recent Labs     04/12/20  0526 04/11/20  0455   WBC 8.42 11.34*   Hgb 10.5* 10.4*   Hematocrit 32.1* 31.5*   Platelets 177 164       Recent Labs     04/12/20  0526 04/11/20  0455   Sodium 140 141   Potassium 3.3* 3.8   Chloride 106 106   CO2 26 24   BUN 11.0 15.4   Creatinine 0.7 0.8   Glucose 164* 226*   Calcium 9.4 8.7       No results for input(s): AST, ALT, ALKPHOS, PROT, ALB in the last 72 hours.    No results for input(s): PTT, PT, INR in the last 72 hours.    Imaging personally reviewed, including:     Signed by: Vernell Barrier, MD, MD    cc: Charlestine Massed, MD  Ro, Saunders Glance, MD

## 2020-04-12 NOTE — H&P (Signed)
IRF Post-Admission Assessment  History and Physical    Reason for admission: Debility due to seizures and aspiration pneumonia    Date of admission: 04/11/2020    PRECAUTIONS: seizure, falls, skin    History of present illness:   Patient is an 87 y.o. right hand-dominant female with prior traumatic brain bleed, NPH, HTN, DM, HLD, hypothyroidism, RA, and osteoporosis who presented to the ED from [facility] assisted living after having a syncopal episode and hitting her head. Reportedly patient was altered while in the ambulance and had seizure activity while in the ED. CT head was done and showed no evidence of acute intracranial injury. EEG was done and was reported as normal. MRI brain showed moderate generalized intraparenchymal volume loss with moderately advanced chronic microvascular changes but no acute changes. Neurology was consulted and patient was started on keppra. She was found to have pneumonia, which was thought likely to be secondary to aspiration from seizure activity. She was started initially on IV abx and then transitioned to oral by the time of discharge. Per documentation the remainder of her hospitalization was unremarkable. She was evaluated by PT/OT/SLP and deemed appropriate for acute inpatient rehab and was transferred to the [facility] inpatient rehab unit on 04/11/2020.    Prior to this admission she lived in an assisted living facility where she is able to get help for many of her daily tasks.    Past Medical History:   Past Medical History:   Diagnosis Date    Anemia     Fibromyalgia     Hyperlipidemia     hypothyroid     Osteoporosis     Rheumatoid     TIA (transient ischemic attack)        Allergies:   Allergies   Allergen Reactions    Biaxin [Clarithromycin] Rash    Zithromax [Azithromycin] Rash           Family History: No family history on file.    Functional history and social history:   Pre-morbid status Admission status   Bed mobility Independent    Transfers  Independent    Locomotion Independent    Upper body dressing Independent    Lower body dressing Independent    Bathing Independent    Toileting Independent    Communication Not impaired    Swallow Not impaired    Cognition Not impaired      Current Functional Status  Weight-bearing Status:   No Restrictions  Mobility: 04/10/2020  Supine to Sit: Moderate Assist;HOB raised, assistance for trunk elevation and LE  management  Sit to Stand: Moderate Assist (Heavy R lean upon standing)  Stand to Sit: Moderate Assist  Sit to Supine: Min A  Locomotion  Ambulation: Moderate Assist (with B handheld assistance)  Pattern: decreased step length;decreased cadence (R lean with instruction to  correct. Heavily pushing through RUE for stability. Small, short steps. Improved  when ambulating back to bed from bathroom.)  Distance Walked (ft) (Step 6,7): 20 Feet  PMP Activity: Step 6 - Walks in Room  Activities of Daily Living: 04/10/2020  Grooming: Minimal Assist;standing at sink;steadying;verbal prompting;wash/dry  hands (occasional R lateral leaning requring verbal and tactile instructions to  increase weightbearing thru LLE with B UE support)  Cognition: 04/10/2020 Follows commands. Safety awareness. Insight. Problem  solving. Memory.  Communication: 04/10/2020 Dysarthria.  Swallowing:  04/10/2020   No swallowing deficits present.  Other Impairments: Gait abnormality      Review of systems:  Pertinent positives are:  Weakness  A full review of systems was obtained and was otherwise negative.    Medications:      Scheduled Meds: PRN Meds:    amLODIPine, 10 mg, Oral, Daily  amoxicillin-clavulanate, 1 tablet, Oral, Q12H [facility]  aspirin, 325 mg, Oral, Daily  atorvastatin, 40 mg, Oral, QHS  calcium citrate-vitamin D, 2 tablet, Oral, Daily  folic acid, 1 mg, Oral, Daily  insulin lispro, 1-3 Units, Subcutaneous, QHS  insulin lispro, 1-5 Units, Subcutaneous, TID AC  lactobacillus/streptococcus, 1 capsule, Oral, Daily  levETIRAcetam, 500 mg, Oral,  Q12H [facility]  levothyroxine, 75 mcg, Oral, Daily at 0600  multivitamin, 1 tablet, Oral, Daily  sertraline, 50 mg, Oral, Daily  vitamin D, 25 mcg, Oral, Daily        Continuous Infusions:   acetaminophen, 650 mg, Q6H PRN  albuterol-ipratropium, 3 mL, Q6H PRN  butalbital-acetaminophen-caffeine, 1 tablet, Q4H PRN  dextrose, 15 g of glucose, PRN   And  dextrose, 12.5 g, PRN   And  glucagon (rDNA), 1 mg, PRN  magnesium hydroxide, 30 mL, Daily PRN  melatonin, 3 mg, QHS PRN  ondansetron, 4 mg, Q8H PRN  senna-docusate, 2 tablet, QHS PRN          Medication Review  1. A complete drug regimen review was completed: Yes  2. Were any drug issues found during review?: No         Physical Examination:   BP 152/76    Pulse 63    Temp 98.1 F (36.7 C) (Oral)    Resp 18    Ht 1.651 m (5\' 5" )    Wt 71.3 kg (157 lb 3 oz)    SpO2 95%    BMI 26.16 kg/m   Estimated body mass index is 26.16 kg/m as calculated from the following:    Height as of this encounter: 1.651 m (5\' 5" ).    Weight as of this encounter: 71.3 kg (157 lb 3 oz).    General: WN/WD, in NAD  HEENT: NC/AT, moist mucosal membranes, anicteric sclera  Cardiac: regular rate, extremities warm and well perfused  Resp: non-labored breathing, acyanotic  Abdomen: soft, non-tender, non-distended  Neuro: Awake and alert, oriented to person and place, knows the month and year but not day, speech fluent and spontaneous without dysarthria but is slowed, naming and repetition intact, calculation impaired      Manual muscle strength testing:  Muscle group Right Left   Shoulder abductors 4 4   Elbow flexors 5 5   Elbow extensors 5 5   Wrist extensors 5 5   Middle finger DIP flexors 5 5   Little finger abductors 5 5   Hip flexors 4 4   Knee extensors 5 5   Ankle dorsiflexors 5 5   Ankle plantarflexors 5 5   Great toe extensors 5 5     Psych: Alert Pleasant and cooperative    INPATIENT REHABILITATION FACILITY - PATIENT ASSESSMENT INSTRUMENT QUALITY INDICATORS       Section C.  Cognitive  Patterns.    C0100. Should Brief Interview for Mental Status (C0200-C0500) be conducted? (3-day assessment period) Attempt to conduct interview with all patients.      0. No (patient is rarely/never understood) Skip to C0900. Memory/Recall Ability   1. Yes Continue to C0200. Repetition of Three Words   Yes     Brief Interview for Mental Status (BIMS)    C0200. Repetition of Three Words      Ask patient: I am going to  say three words for you to remember. Please repeat the words after I have said all three. The words are: sock, blue and bed. Now tell me the three words.    Number of words repeated by patient after first attempt:   3. Three   2. Two   1. One   0. None     Two    After the patient's first attempt say: I will repeat each of the three words with a cue and ask you about them later: sock, something to wear; blue, a color; bed, a piece of furniture. You may repeat the words up to two more times             Brief Interview for Mental Status (BIMS) - Continued    C0300. Temporal Orientation: Year, Month, Day      A. Ask patient: Please tell me what year it is right now. Patient's answer is:   3. Correct   2. Missed by 1 year   1. Missed by 2 to 5 years   0. Missed by more than 5 years or no answer     Correct       B. Ask patient: What month are we in right now? Patient's answer is:   2. Accurate within 5 days   1. Missed by 6 days to 1 month  0. Missed by more than 1 month or no answer     Accurate within 5 days       C. Ask patient: What day of the week is today? Patient's answer is:   1. Correct   0. Incorrect or no answer     Incorrect or no answer     C0400. Recall    Ask patient: Let's go back to the first question. What were those three words that I asked you to repeat? If unable to remember a word, give cue (i.e., something to wear; a color; a piece of furniture) for that word.     A. Recalls sock?   2. Yes, no cue required  1. Yes, after cueing ("something to wear")  0. No, could not  recall     Yes, no cue required     B. Recalls blue?   2. Yes, no cue required   1. Yes, after cueing ("a color")  0. No, could not recall     Yes, after cueing ("a color")     C. Recalls bed?  2. Yes, no cue required   1. Yes, after cueing ("a piece of furniture")   0. No, could not recall     Yes, after cueing ("a piece of furniture")   C0500. BIMS Summary Score.      Select "Yes", if the patient was unable to complete the interview.  Select "No", if the patient was able to complete the interview.   No     C0600. Should the Staff Assessment for Mental Status [phone]) be Conducted?      0. No (patient was able to complete Brief Interview for Mental Status)   1. Yes (patient was unable to complete Brief Interview for Mental Status) Continue to C0900. Memory/Recall Ability     No (patient was able to complete Brief Interview for Mental Status)       Staff Assessment for Mental Status.    Do not conduct if Brief Interview for Mental Status (C0200-C0500) was completed.    C0900. Memory/Recall Ability.    Check all that the patient was normally  able to recall    A. Current season    B. Location of own room    C. Staff names and faces    E. That he or she is in a hospital/hospital unit    Z. None of the above were recalled     Current Season, Location of own room, Staff names and faces and That he or she is in a hospital/hospital unit       Labs:   No results for input(s): GLUCOSEWHOLE in the last 24 hours.    Recent Labs   Lab 04/12/20  0526 04/11/20  0455 04/10/20  0519 04/08/20  1639   WBC 8.42 11.34* 18.92* 20.05*   Hgb 10.5* 10.4* 10.2* 11.0*   Hematocrit 32.1* 31.5* 31.4* 34.1*   Platelets 177 164 149 185        Recent Labs   Lab 04/12/20  0526 04/11/20  0455 04/10/20  0519 04/09/20  0914   Sodium 140 141 138 138   Potassium 3.3* 3.8 3.8 4.0   Chloride 106 106 105 105   CO2 26 24 22 24    BUN 11.0 15.4 13.0 15.3   Creatinine 0.7 0.8 0.8 0.8   Calcium 9.4 8.7 8.8 8.9   Albumin  --   --   --  3.9   Protein, Total   --   --   --  7.0   Bilirubin, Total  --   --   --  1.6*   Alkaline Phosphatase  --   --   --  57   ALT  --   --   --  11   AST (SGOT)  --   --   --  20   Glucose 164* 226* 164* 208*   Magnesium  --   --   --  2.1   Phosphorus  --   --   --  3.2       Recent Labs   Lab 04/08/20  1639   PT INR 1.1       Results     Procedure Component Value Units Date/Time    Glucose Whole Blood - POCT [[id number]]  (Abnormal) Collected: 04/12/20 1135     Updated: 04/12/20 1138     Whole Blood Glucose POCT 273 mg/dL     MRSA culture - Nares [[id number]] Collected: 04/12/20 0526    Specimen: Culturette from Nares Updated: 04/12/20 1056    MRSA culture - Throat [[id number]] Collected: 04/12/20 0526    Specimen: Culturette from Throat Updated: 04/12/20 1056    Glucose Whole Blood - POCT [[id number]]  (Abnormal) Collected: 04/12/20 0855     Updated: 04/12/20 0858     Whole Blood Glucose POCT 212 mg/dL     Cell MorpHology [[id number]]  (Abnormal) Collected: 04/12/20 0526     Updated: 04/12/20 0734     Cell Morphology Abnormal     Platelet Estimate Normal     Macrocytic 2+     Polychromasia Present    Manual Differential [[id number]]  (Abnormal) Collected: 04/12/20 0526     Updated: 04/12/20 0734     Segmented Neutrophils 52 %      Band Neutrophils 2 %      Lymphocytes Manual 15 %      Monocytes Manual 18 %      Eosinophils Manual 2 %      Basophils Manual 1 %      Metamyelocytes 2 %      Myelocytes 7 %  Atypical Lymphocytes % 1 %      Neutrophils Absolute Manual 4.38 x10 3/uL      Band Neutrophils Absolute 0.17 x10 3/uL      Lymphocytes Absolute Manual 1.26 x10 3/uL      Monocytes Absolute 1.52 x10 3/uL      Eosinophils Absolute Manual 0.17 x10 3/uL      Basophils Absolute Manual 0.08 x10 3/uL      Metamyelocytes Absolute 0.17 x10 3/uL      Myelocytes Absolute 0.59 x10 3/uL      Atypical Lymphocytes Absolute 0.08 x10 3/uL     CBC and differential [[id number]]  (Abnormal) Collected: 04/12/20 0526    Specimen: Blood Updated: 04/12/20 0733      WBC 8.42 x10 3/uL      Hgb 10.5 g/dL      Hematocrit 40.9 %      Platelets 177 x10 3/uL      RBC 3.43 x10 6/uL      MCV 93.6 fL      MCH 30.6 pg      MCHC 32.7 g/dL      RDW 16 %      MPV 10.9 fL      Nucleated RBC 0.0 /100 WBC      Absolute NRBC 0.00 x10 3/uL     Basic Metabolic Panel [[id number]]  (Abnormal) Collected: 04/12/20 0526    Specimen: Blood Updated: 04/12/20 0724     Glucose 164 mg/dL      BUN 81.1 mg/dL      Creatinine 0.7 mg/dL      Calcium 9.4 mg/dL      Sodium 914 mEq/L      Potassium 3.3 mEq/L      Chloride 106 mEq/L      CO2 26 mEq/L      Anion Gap 8.0    GFR [[id number]] Collected: 04/12/20 0526     Updated: 04/12/20 0724     EGFR >60.0    Hemolysis index [[id number]] Collected: 04/12/20 0526     Updated: 04/12/20 0724     Hemolysis Index 6    Glucose Whole Blood - POCT [[id number]]  (Abnormal) Collected: 04/11/20 2232     Updated: 04/11/20 2233     Whole Blood Glucose POCT 144 mg/dL     Glucose Whole Blood - POCT [[id number]]  (Abnormal) Collected: 04/11/20 1755     Updated: 04/11/20 1800     Whole Blood Glucose POCT 229 mg/dL              Radiology: all results from this admission  CT Reconstruction T-spine    Result Date: 04/08/2020   Mild anterior wedge compression fracture at T7 is age indeterminate, but has imaging features of chronicity. No definitive acute thoracic vertebral fracture, compression deformity or traumatic malalignment. Diffuse thoracic spondylosis. Waynard Edwards, MD  04/08/2020 6:00 PM    CT Head without  Contrast    Result Date: 04/08/2020   1. No CT evidence of acute intracranial abnormality. 2. Cerebral volume loss, intracranial atherosclerosis and moderate sequela of chronic small vessel ischemic disease. Waynard Edwards, MD  04/08/2020 5:03 PM    CT Angiogram Chest    Result Date: 04/08/2020  COMBINED   No definite acute pathology noted within the chest abdomen or pelvis. Groundglass opacities within the right lower lobe could represent infection. Trace left pleural effusion.  Jimmye Norman, MD  04/08/2020 6:21 PM    CT Cervical Spine WO Contrast    Result Date:  04/08/2020   No acute cervical spine fracture or traumatic malalignment. Cervical spondylosis and facet arthrosis with degenerative spondylolisthesis as described. Waynard Edwards, MD  04/08/2020 5:48 PM    MRI Brain W WO Contrast    Result Date: 04/09/2020   1. No MR evidence of acute intracranial abnormality, infarct, mass, hemorrhage or hydrocephalus. 2. Moderate generalized intraparenchymal volume loss with moderately advanced chronic microvascular sequela. Waynard Edwards, MD  04/09/2020 10:15 PM    CT Abdomen Pelvis W IV/ WO PO Cont    Result Date: 04/08/2020  COMBINED   No definite acute pathology noted within the chest abdomen or pelvis. Groundglass opacities within the right lower lobe could represent infection. Trace left pleural effusion. Jimmye Norman, MD  04/08/2020 6:21 PM     Chest AP Portable    Result Date: 04/08/2020  . Marland Kitchen Mild left basilar atelectasis Marty Heck, MD  04/08/2020 4:44 PM    CT Reconstruction L-spine    Result Date: 04/08/2020   No acute lumbar vertebral fracture, compression deformity or traumatic malalignment. Degenerative spondylosis and facet arthrosis superimposed upon S-shaped rotatory scoliosis, multilevel lateral listhesis and L5-S1 anterolisthesis. Waynard Edwards, MD  04/08/2020 5:52 PM        Assessment:     87yo F with prior traumatic brain bleed, NPH, HTN, DM, HLD, hypothyroidism, RA, and osteoporosis presented after syncopal event and was found to have aspiration pneumonia thought to be secondary to seizures    #New onset seizures  - patient with syncopal episode in assisted living facility and then seizure activity while in ED  - spot EEG negative  - CT head and MRI brain negative for acute intracranial process  - seen by neurology, Dr. Marney Doctor while in acute hospital  - seizure precautions  - continue with keppra 500mg  BID    #Hx of traumatic brain bleed/NPH  - CT and MRI showing  chronic changes only; without evidence of hydrocephalus  - does have some cognitive impairments that may be secondary to this prior injury  - SLP will be working with patient on cognition    #Aspiration pneumonia  - thought to be secondary to seizure activity  - was on zosyn while in acute hospital and transitioned to augmentin to complete 5 day course on admission to rehab, stop date 1/16  - probiotic, incentive spirometer, duonebs PRN  - will need repeat CXR in 4-6 for resolution    #HTN  - amlodipine 10mg     #DM  - HbA1c 7.6  - per daughter the patient's blood sugar elevates when she is stressed and then it returns to normal, decision was made to hold off on starting an oral agent  - continue SSI    #HLD  - atorvastatin 40mg     #Hypothyroidism  - levothyroxine    #Anemia  - likely secondary to chronic disease  - Hb 10.5 on admission, continue to monitor with intermittent cbc    #T7 compression fracture  - age indeterminate with no acute findings on exam, continue to monitor    #Depression  - zoloft 50mg     #Rheumatoid arthritis  - on methotrexate qweekly, will confirm date and dose, likely start after abx completed    #Osteoporosis  - citracal-vitamin D 2 tab daily    #DVT ppx  - ASA 325    #Dispo  TBD        Patient Active Problem List   Diagnosis    Fall, initial encounter    Seizure after head  injury    Debility       [x]  Requires an intensive inpatient rehabilitation program with multidisciplinary therapies, rehab nursing, and close physician management.    [x]  The following co-morbidities may complicate rehabilitation:  Anemia, Pneumonia and Uncontrolled diabetes mellitus    [x]  Is at risk for the following complications:  Injurious falls, Pneumonia, Urinary tract infection, Venous thromboembolic disease and Hypoglycemia        Individualized Plan of Care:    - REHAB: Begin comprehensive and intensive inpatient rehab program, including:  Neuropsychology consult as needed;  Physical therapy 60-120  min daily, 5-6 times per week  Occupational therapy  60-120 min daily, 5-6 times per week  Speech therapy  60-120 min daily, 5-6 times per week  Therapeutic recreation  Psychology  Dietician consultation  Case management  Rehabilitation nursing    Will work in an interdisciplinary manner to address the following impairments and issues:   Mobility, ADLs, Cognitive impairments, Impaired strength, Impaired ROM, Impaired endurance, Medication management, Adjustment to disability, Leisure skills, Community support and resources and Impaired coordination    Requires 24h rehabilitation nursing to address:  Antibiotic administration, Bladder care, Bowel care, Glucose control, Hydration needs, Medication teaching, Nutrition, Positioning, Safety, Cardiopulmonary reassessments and Neuro checks    Anticipate a discharge to:  Assisted living facility    Estimated length of stay: 7-10 days      Goals for discharge:  Bed mobility Modified independent with LRAD   Transfers Modified independent with LRAD   Locomotion Modified independent with LRAD   Upper body dressing Modified independent with LRAD   Lower body dressing Modified independent with LRAD   Bathing Modified independent with LRAD   Toileting Modified independent with LRAD   Communication Use compensatory strategies to express needs and wants     Swallow Tolerate least restrictive oral diet without signs or symptoms of aspiration     Cognition Use compensatory strategies appropriately to compensate       Rehab potential: good    Prognosis: good    Potential limitations:Poor carryover      Review of Pre-admission assessment  [x]  I have reviewed the nurse liaisons pre-admission assessment in [employer].   I do not note any significant changes at this time and agree with patient's appropriateness for intensive inpatient rehab program.      Signed by: Charlestine Massed, MD MD    [facility]      If there are questions or concerns about the  content of this note or information contained within the body of this dictation they should be addressed directly with the author for clarification.

## 2020-04-12 NOTE — Rehab Evaluation ([facility]) (Addendum)
Corrected 04/13/2020 4:59:30 PM    NAME: Yolanda Cox  MRN: [id number]  Account: [id number]  Session Start: 04/12/2020 2:00:00 PM  Session Stop: 04/12/2020 3:00:00 PM    Total Treatment Minutes: 60.00 Minutes    Physical Therapy  Inpatient Rehabilitation Eval    Rehab Diagnosis: Closed head injury after a fall, Mild anterior wedge  compression fracture of T7  Demographics:            Age: 87Y            Gender: Female  Primary Language: English  Past Medical History: Anemia  Fibromyalgia  Hyperlipidemia  hypothyroid  Osteoporosis  Rheumatoid  TIA (transient ischemic attack)    PSH  CHOLECYSTECTOMY  History of Present Illness: "Yolanda Cox is a 87 y.o. female with a  medical history of anemia fibromyalgia, HLD, hypothyroid, osteoporosis,  rheumatoid, arthritis, TIA, and cerebral hemorrhage who was brought in by EMS to  [facility] status post fall and head injury at the [facility] retirement community.  Per EMR, patient was walking at the retirement community that she is a resident  of when she fell forward striking her forehead on the floor, patient had been  reportedly nauseated prior to the fall. Per EMR, patient had a change in her  mental status while in route to the hospital. At this time patient has a flat  affect and very delayed responses, patient is alert to self and place. Patient  reported a headache and points to the back of her head and reports of a  backache. While in the ER she had a episode of tonic-clonic seizure as described  by her daughter.  Patient has history of traumatic brain bleed in [place]  about a year ago.  She also has history of normal pressure hydrocephalus. She  lives alone in [place] independent living in a 1 level home with no steps to  enter or inside. Prior to her recent decline she was independent for all  mobility and ADLs. Currently she has min a to mod a for bed mobility and  transfers, She walked 20 feet mod a with a rw. She is min a for grooming. She  has  mild-moderately decreased cognitive/linguistic skills characterized by  decreased attention/concentration, short term memory, and executive functioning.  The patient has been working well with therapy services to include PT, OT, and  SLP to address functional mobility, self-care deficit, cognition, and  swallowing.  The patient would benefit from an intensive level of rehab along  with close medical management of multiple medical conditions and rehab MD to  drive the rehab process.  The patient is medically stable for IRF level of  care."   Date of Onset: 04/09/2020   Date of Admission: 04/11/2020 5:40:00 PM  Premorbid Functional Level: Pt reports being independent with ADLS prior to  admission. Pt reports using a 4WW for ambulation at baseline within her home and  community. Pt does not drive, and reports that her daughter or "the staff" at  her retirement home/assisted living center are responsible for providing her  with food.  Social/Education History: Pt reports living alone in an apartment/retirement  home in [place], [place]. 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 Pt has an adult daughter named [name] that lives and works in  [place], that can help her mother on her days off. Pt reports not  having any hobbies since her vision has deteriorated /R/2  years ago. Pt may not  be reliable historian 2/2 cognitive deficits. PT to confirm pt's subjective  reporting with pt's daughter, [name].  Home Environment: Pt reports living on the 3rd level of an apartment/assisted  living facility w/ no STE.  Pt reports not being a stair user; instead she  relies on elevator w/in building to access her apartment. Pt reports apartment  is single level, w/ a walk in shower.   DME: 1OX, shower chair, grab bars in shower, grab bars surrounding toilet,  elevated toilet.    Medications and Allergies: Significant rehabilitation considerations:   see EPIC  Rehabilitation Precautions/Restrictions:    Fall precaution, skin precaution, seizure precaution, HOH, impaired vision,  delayed mental processing    SUBJECTIVE  Patient/Caregiver Goals:  Patient's functional goals: "to get better"  Pain: Patient currently has pain.  Location: Pt initially reports no pain, but reaches to back of head when  answering. Upon reasassement, pt reports back of her head is tender from fall.  Type: Acute  Quality: Aching.  Tender.  Pain Scale: Behavioral.  Patient reports a pain level of - unable to comprehend 0-10 scale. 2/2 cognitive  deficits, numeric scale not utilized.  Pain Reassessment: No other pain is reported at this time.    OBJECTIVE  General Observations: Pt received sitting in WC, all needs within reach and call  bell within  reach. Pt requires simple instructions and increased time to answer questions  2/2 cognitive deficits. Pt is HOH and may have impaired vision.  Understanding of Current Condition: Pt has fair understanding of medical and  functional status, noting her impaired vision from her PMH has affected her  ability to participate in hobbies and recreational activities.  Vital Signs:                       Before Activity              After Activity  Vitals  Time                 2:00                         -  Position/Activity    Sitting upright in WC        -  BP Systolic          137                          -  BP Diastolic         71                           -  Pulse                69                           -  O2 Saturation        97%                          -    Communication/Cognition: Pt AxO x 3. Unable to recall name of hospital. Pt  presents w/ decreased attention and memory 2/2 residual cognitive deficits,  requiring increased time to answer questions and follow directions. Pt requires  verbal cuing  for proper sequencing of functional tasks, and increased time to  complete tasks.  Sensation: Distal and proximal sensation intact to light touch and grossly WNL.  UE and LE sensation grossly WNL.  Sensation to light touch equal bilaterally and  WNL.  Range of Motion: BL UE AROM WFL; BL LE AROM WFL  Strength/Motor Control: All BL UE ROM grossly 5/5.  BL hip flexion 4/5  for gross MMT. All other BL LE grossly 5/5. Tone of UE and  LE appears grossly normal.  Balance: Static sitting balance: Good-;  pt able to sit unsupported on EOB for  >20 seconds without balance loss and touch assist to maximize safety.   Dynamic sitting fair+; pt able to BL and anteriorly weight shift to prepare for  sit<>stand at EOB while placing BUE flat on bed for support. Static standing  balance fair-; pt requires FWW or helper to stand w/o LOB. Dynamic standing  balance: pt requires Min Assist from helper or FWW to safely weight shift past  midline.      10 Meter Walk Test  Gait speed in meters/second: 0.21 meters per second  Assistive devices or braces used: FWW  Assist required to complete test: Min A  Additional comments:   Gait speed, avg of 3 trials    Lower Extremity Orthosis: Patient does not present with foot drop or ankle  instability and does not need an orthotic.    Interventions:  High Complexity Evaluation: A history of present problem with 3 or more personal  factors and/or comorbidities that impact the plan of care  A clinical presentation with unstable and unpredictable characteristics No  treatment provided today. Fitted pt for a 18x18 WC cushion and a FWW. Pt  requires use of FWW at all times to safely ambulate 2/2 decreased balance,  decreased SLS phase of gait, and impaired cognition.  Patient was left seated in chair in his/her room. Handoff to nurse completed.  Chair alarm in place and activated.  Oriented to call bell and placed within reach.  Personal items within reach.  Assistive devices positioned out of reach.    Interdisciplinary Educational Needs and Learning Preferences:       Learning Preference: The patient's preferred learning method is:  Explanation.  The patient's preferred learning method is:  Demonstration.       Barriers to Learning: Acuity of illness.  Cognitive limitations.  Hearing deficits.  Visual deficits.       Learning Needs: Brain injury, Communication/cognition, Debility, Equipment,  Functional activities/mobility, Plan of care, Precautions, Rehabilitation  techniques and procedures, Safety    Education Provided: Precautions. Pain management. Side effects. Plan of care.  Rehab techniques and procedures. Oriented pt to IE. Safety issues and  interventions. Supervision requirements. Use of adaptive devices. Activities of  daily living. Bed mobility. Functional transfers. Fall prevention/balance  training. Financial planner. Wheelchair mobility. Equipment.  Brain Injury: Orientation       Audience: Patient.       Mode: Explanation.  Demonstration.       Response: Applied knowledge.  Verbalized understanding.  Needs practice.  Needs reinforcement.    ASSESSMENT  Summary of Deficits and Prognosis: Pt is an 87 y/o female S/P a mechanical fall  resulting in a TBI. Pt's PLOF is MOD I using a FWW. During PT eval, pt required  SUPV for functional bed mobility including  rolling L<>R, and sit<>supine. Pt  requires Min A for sit<> stand transfers from [facility] and bed w/ increased time and  effort 2/2 to cognitive deficits. Pt performed sit<>stand from bed and no AD w/  Min assist to maximize safety. Pt able to perform independent sitting balance  for >20 seconds, demonstrating appropriate trunk control for static ADLs. During  ambulation w/ FWW, pt presents w/ step through pattern w/ decreased step-length  and cadence. Pt showed no LOB during gait assesment using FWW. Pt demonstrates  self selected gait speed of 0.21 m/s on the , indicative of limited  household ambulation capacity. Recommending skilled PT care with a focus on  transfers, balance, and gait training to improve functional mobility, increase  independence, and return to PLOF.  Rehab Potential: Good family/social support, Good premorbid  functional status,  Living in the community premorbidly Pt living in assisted living or retirement  center prior to rehab admission, which provides assistance w/ household tasks  such as cooking for the residents.  Barriers to Progress/Discharge: Social/cultural barriers, Poor activity  tolerance, Functional status, Medical condition    CARE Tool  The scores below reflect the patient's usual function prior to therapeutic  intervention.    Mobility Functional Assessment  Roll Left and Right: 06 - Independent without an assistive device    Sit to Lying: 04 - Supervision: Helper provides supervision for safety or verbal  cues ONLY  Assistive Device(s):   Bed rail   bed flat    Lying to Sitting on Side of Bed: 04 - Supervision: Helper provides supervision  for safety or verbal cues ONLY  Assistive Device(s):   Bed rail   w/ bed flat    Sit to Stand: 03 - Partial/Moderate Assistance: Helper does 1-25% of the  activity  Assistive Device(s):   Elevated bed/surface, Rolling walker   Without FWW and elevated chair, PT provided <20% of lift during sit<>stand  transfer. With FWW, elevated chair and ongoing cues for transfer technique, PT  provided <10% of lift to stand.    Chair/Bed-to-Chair Transfer: 03 - Partial/Moderate Assistance: Helper does 1-25%  of the activity  Assistive Device(s):   Rolling walker   Pt performs w/ Mod A and minimal verbal cuing to increase safety.    Car Transfer: 03 - Partial/Moderate Assistance: Helper does 26-50% of the  activity   Using FWW and touch assist, pt requires increased time and multimodal cuing for  hand and foot placement to increase safety while transferring in and out of car.      Walk 10 Feet: 03 - Partial/Moderate Assistance: Helper does 1-25% of the  activity   Pt ambulated /R/10 feet with no AD and touch assist w/ decreased step length,  decreased cadence, narrow BOS 2/2 fear avoidant behaviors.    Walk 50 Feet with Two Turns: 03 - Partial/Moderate Assistance: Helper does  1-25%  of the activity   Pt tolerated /R/70 ft ambulation using FWW w/ touch assist and increased time  and effort 2/2 decreased gait speed and step length.    Walk 150 Feet:  88 - Not attempted due to medical or safety concerns.   Pt did not perform 2/2 to fatigue at time of gait assessment.    Walking 10 Feet on Uneven Surface: 88 - Not attempted due to medical or safety  concerns.   Pt did not perform 2/2 decreased balance during ambulation.    1 Step (curb):  88 - Not attempted due to medical or safety concerns.   Pt did not perform curb negotiation due to decreased dynamic standing balance.    4 Steps:  09 - Not applicable.   Pt reports not using stairs prior to rehab admission.    12 Steps: 09 - Not applicable.   Pt reports not using stairs prior to rehab admission.    Wheelchair: Patient does not use a wheelchair or scooter.    Mobility Discharge Goals (not met at this time):   Roll Left and Right: Patient completed the activities by him/herself with no  assistance from a helper.   Sit to Lying: Helper provides verbal cues or touching/steadying assistance as  patient completes activity.   Lying to Sitting on Side of Bed: Helper provides verbal cues or  touching/steadying assistance as patient completes activity.   Sit to Stand: Helper does less than half the effort. Helper lifts, holds or  supports trunk or limbs but provides less than half the effort.   Chair/Bed to Chair Transfer: Helper does less than half the effort. Helper  lifts, holds or supports trunk or limbs but provides less than half the effort.   Toilet Transfer: Helper does less than half the effort. Helper lifts, holds or  supports trunk or limbs but provides less than half the effort.   Car Transfer: Helper does less than half the effort. Helper lifts, holds or  supports trunk or limbs but provides less than half the effort.   Walk Discharge Goals:   Walk 50 Feet With 2 Turns: Helper does less than half the effort. Helper lifts,  holds or  supports trunk or limbs but provides less than half the effort.   Walk 150 Feet: Not attempted due to medical conditions or safety concerns   Walking 10 Feet on Uneven Surfaces: Not attempted due to medical conditions or  safety concerns   1 Step Over Curb or Up/Down Stair: Not attempted due to medical conditions or  safety concerns.    Short Term Goals:  Time frame to achieve short term goal(s): 2 weeks from IE on  04/12/20       1.  Pt will perform sit<>stand transfer from bed <>WC using LRAD and SUPV  to increase safety and independence.       2. Pt will ambulate >150 feet w/ FWW and SUPV to  demonstrate an increase in endurance and functional independence.       3. Pt will reach outside BOS 5 consecutive times in sitting w/ touch assist  to demonstrate weight shift in all planes to increase safety and dynamic sitting  balance.       4. Pt will navigate 6" threshold using LRAD and Min Assist to safely  simulate curb negotiation for increased dynamic standing balance and reduced  caregiver burden.  Long Term Goals:   Time frame to achieve long term goal(s): Time frame to  achieve short term goal(s): 2 weeks from IE on 04/12/20       1. Pt will perform 8 sit<>stand transfer from bed <>WC using LRAD and touch  assist, without LOB or rest breaks to demonstrate increased cardiovascular and  BLE strength and endurance.       2. Pt will ambulate >250 feet w/ FWW and SUPV to   demonstrate an increase in endurance and functional independence to return to  PLOF       3. Pt will reach outside of BOS 10 consecutive times in standing w/ Min  Assist to demonstrate weight shift in all planes to demonstrate increased  dynamic sitting balance and increased safety.       4.  Pt  will ambulate >50 feet w/ FWW, while navigating moving obstacles and  touch assist, to demonstrate increased focus, safety, and functional mobility in  a distracting environment.    Risks/Benefits of Rehabilitation Discussed with Patient/Caregiver:  Yes.  Recommendations/Goals for Rehabilitation Discussed with Patient/Caregiver: Yes.    PLAN  Physical Therapy Plan: Physical Therapy is recommended.  Recommended Frequency/Duration/Intensity: 60-120 minutes, 5-7 days a week for  10-14 days from IE on 04/12/2020.  Activities Contributing Toward Care Plan: TherActivities, TherEx, Transfer  training, gait training, static and dynamic balance training in sitting and  standing, cardiovascular endurance training, B LE strength training. Pt will  benefit from skilled care to improve safety, decrease fall risk, and reduce  caregiver burden, Shafter planning as part of the interdisciplinary team.  The patient is not appropriate for group treatment.    Team Care Plan  Please review Integrated Patient View Care Plan Flowsheet for Team identified  Problems, Interventions, and Goals    Identified problems from team documentation:  Problem: Impaired Cognition  Cognition: Primary Team Goal: Pt will answer orientation and biographical  questions with >90% accuracy given min cuing in order to improve memory./    Problem: Safety Risk and Restraint  Safety: Primary Team Goal: Patient will recognized limitations and call for  assistance 100% of the time before getting out of bed./    Identified problems from this assessment:     Mobility : Pt will correctly demonstrate safe sit<>stand transfers and  ambulation using FWW under direct SUPV or touch assist via clinical team.    Discipline:  Physical Therapy    3 Hour Rule Minutes: 60 minutes of PT treatment this session count towards  intensity and duration of therapy requirement. Patient was seen for the full  scheduled time of PT treatment this session.  Therapy Mode Minutes: Individual: 60 minutes.    Assessment and treatment was provided under the 1:1 direct supervision of Juluis Pitch, PT, DPT, NCS    Signed by: Everlene Other, SPT 04/12/2020 3:00:00 PM    Direct 1:1 supervision of Everlene Other, student of physical therapy,  was  provided throughout this evaluation.   - CoSigned By: Juluis Pitch, PT 04/12/2020 9:57:41 PM    Direct 1:1 supervision of Everlene Other, student of Physical Therapy, was  provided throughout this evaluation. - CoSigned By: Juluis Pitch, PT 04/13/2020  4:55:06 PM

## 2020-04-12 NOTE — Rehab IRF Data Coll Rights Priv Act ([facility]) (Signed)
NAMEWANDALEE KLANG  MRN: [id number]  Account: [id number]  Session Start: 04/11/2020 12:00:00 AM  Session Stop: 04/11/2020 12:00:00 AM    Total Treatment Minutes:  Minutes      IRFPPS Data Collection Rights and Rehab Privacy Act Notice: Patient received a  copy of the IRF-PAI Data Collection Rights and Rehab Privacy Act Notice.    Signed by: Hennie Duos, Quality Coordinator for Rehab Services 04/11/2020  5:50:00 PM

## 2020-04-12 NOTE — Rehab Evaluation (Medilinks) (Addendum)
Corrected 04/12/2020 5:34:19 PM    NAME: Yolanda Cox  MRN: [id number]  Account: [id number]  Session Start: 04/12/2020 10:00:00 AM  Session Stop: 04/12/2020 11:00:00 AM    Total Treatment Minutes: 60.00 Minutes    Occupational Therapy  Inpatient Rehabilitation Evaluation    Rehab Diagnosis: Neurological Condtions  Brain injury  Demographics:            Age: 87Y            Gender: Female  Primary Language: English    Past Medical History: Anemia  Fibromyalgia  Hyperlipidemia  hypothyroid  Osteoporosis  Rheumatoid  TIA (transient ischemic attack)    PSH  CHOLECYSTECTOMY  History of Present Illness: Yolanda Cox is a 87 y.o. female with a  medical history of anemia fibromyalgia HLD hypothyroid osteoporosis rheumatoid  arthritis TIA and cerebral hemorrhage  who was brought in by EMS to [facility] status  post fall and head injury at the [employer] retirement community. Per EMR,  patient was walking at the retirement community that she is a resident of when  she fell forward striking her forehead on the floor, patient had been reportedly  nauseated prior to the fall. Per EMR, patient had a change in her mental status  while in route to the hospital. At this time patient has a flat affect and very  delayed responses, patient is alert to self and place. Patient reported a  headache and points to the back of her head and reports of a backache. While in  the ER she had a episode of tonic-clonic seizure as described by her daughter.  Patient has history of traumatic brain bleed in [place] about a year ago.   She also has history of normal pressure hydrocephalus. She lives alone in [place] independent living in a 1 level home with no steps to enter or inside.  Prior to her recent decline she was independent for all mobility and ADLs.  Currently she has min a to mod a for bed mobility and transfers, She waslked 20  feet mod a with a rw. She is min a for grooming. She has mild-moderately  decreased  cognitive/linguitci skills characterized by decreased  attention/concentration, short term memory, and executive functioning. The  patient has been working well with therapy services to include PT, OT, and SLP  to address functional mobility, self-care deficit, cognition, and swallowing.  The patient would benefit from an intensive level of rehab along with close  medical management of multiple medical conditions and rehab MD to drive the  rehab process.  The patient is medically stable for IRF level of care.   Date of Onset: 04/09/2020   Date of Admission: 04/11/2020 5:40:00 PM  Premorbid Functional Level: Per pt, PTA she was Independent with ADLs, ambulates  with a FWW, Pt does not drive or cook (has prepared meals).  Social/Educational History: Pt is a widower in the past eight years. She has two  children: [name] lives in [place], and Lapeer lives in [place], [place].  Home Environment: Pt lives in an ILF [facility]) with an Actor. She  has walk in shower with a shower chair,grab bars in shower, hand-held shower,and  grab bars around toilet      Medications and Allergies: Significant rehabilitation considerations:   Please see epic for more information  Rehabilitation Precautions/Restrictions:   Fall precaution, seizure precaution    SUBJECTIVE  Patient Report: "I am very weak"  Patient/Caregiver Goals:  Patient's functional goals: "  I got to get my balance  and walk again"  Pain: Patient currently without complaints of pain.  Pain Reassessment: Pain was not reassessed as no pain was reported.    OBJECTIVE  General Observation: Epic chart reviewed, and orders acknowledged. Direct  handoff from RN approving pt for ADL session. Pt received semi reclined in bed,  dressed with her night gown, skin appears intact with contusion on her occipital  lobe of her head and pt is agreeable to participate.  Understanding of Current Condition: Fair understanding of medical and functional  status, able to recognize changes in  physical and cognitive abilities.  Vital Signs:                       Before Activity              After Activity  Vitals  Position/Activity    semi reclined in bed         shower  BP Systolic          129                          133  BP Diastolic         73                           84  Pulse                72                           -  O2 Saturation        94%                          -    Cognition: Pt is alert and OX3 as she was disoriented to place. She is calm,  cooperative, flat affect (delayed responses) and appropriate responses to  stimuli. She is with decreased recall of recent events as decreased short term  memory  . She was able to follow commands and maintain safety awareness with minimal  verbal cues.  Vision:  Pt is with decreased visual acuity reporting she has cloudiness as a  result of a blood vessel burst in her eyes prior to this hospitalization. Pt was  able to read numbers presented to her from approx 7 ft and with decreased  exposure to light. Pt is with delayed tracking, however, intact EOM, saccades,  and convergence. More testing required to assess peripheral vision and OT will  monitor in future session.  Perception: Intact visual perception    Upper Extremity Status   Tone: Pt is normotonic   Range of Motion: all joints in BUE are WFL   Fine Motor: Pt is with delayed thumb opposition, and ginger to nose, however,  was observed opening and grasping self containers with no extra efforts.   Other: Pt has a hx of falls, and is with significant fear of falling.  Additionally, she is b/l HOH using hearing aids.    Strength/Motor Control: MMT: 4+/5 for elbow flex and extension. 4-/5 for  shoulder flexion and abduction  Sensation: Intact light touch, pt denies any abnormal sensation    Interventions:  High Complexity Evaluation: An occupational profile and medical and therapy  history, which includes review of medical and/or therapy  records and extensive  additional review of physical,  cognitive, or psychosocial history related to  current functional performance  An assessment(s) that identify 5 or more performance deficits (i.e., relating to  physical, cognitive, or psychosocial skills) that result in activity limitations  and/or participation restrictions  Patient presents with comorbidities that affect occupational performance.  Significant modification of tasks or assistance (i.e., physical or verbal) with  assessment(s) is necessary to enable patient to complete evaluation component  Clinical decision-making is of high analytic complexity, which includes an  analysis of the patient profile, analysis of data from comprehensive  assessment(s), and consideration of multiple treatment options No treatment  provided today.  Patient was left seated in chair in his/her room. Handoff to nurse completed.  Chair alarm in place and activated.  Oriented to call bell and placed within reach.  Personal items within reach.  Assistive devices positioned out of reach.    Interdisciplinary Educational Needs and Learning Preferences:       Learning Preference: The patient's preferred learning method is:  Explanation.  The patient's preferred learning method is: Demonstration.       Barriers to Learning: Acuity of illness.  Cognitive limitations.  Hearing deficits.  Mobility.  Visual deficits.       Learning Needs: Brain injury, Communication/cognition, Debility, Plan of  care, Rehabilitation techniques and procedures, Safety    Education Provided: No education provided this session.    ASSESSMENT  Summary of Deficits and Prognosis: Yolanda Cox is an 87 year old female  with above-mentioned past medical and surgical history admitted to [facility] IR on  04/11/2020 s/p a fall resulted in head injury. Pt presented to OT eval with  decreased standing and seated balance lateral lean, with significant fear of  falling, decreased strength and endurance, decreased motor planning, cognitive  deficits, HOH, and visual  deficits all negatively affecting her ability to  complete her self care tasks safely and independently. Pt currently requires Min  A for many of her self care and mobility tasks. Pt will benefit from OT services  using multi-disciplinary approach to maximize independence and safety with  self-care tasks. Pt lives in an ILF and will be most likely need supervision  post IR to prevent future falls and ensure safety. ELOS is 10-14 days to allow  pt to advance within her POC, reduce care giver burden and prepare for a safe  [place].  Rehab Potential: Able to participate in an intensive inpatient interdisciplinary  rehabilitation program, Good family/social support, Good premorbid functional  status, Living in the community premorbidly  Barriers to Progress/Discharge: Medical condition, Reduced insight    Short Term Goals:  Time frame to achieve short term goal(s):   in 6-8 days       1. Pt will complete LB dress with CGA using AD as indicated       2. Pt will complete oral care/grooming tasks standing at sink side for  approx 3 minutes with CGA using AD as indicated.       3. Pt will complete toilet transfer with CGA using appropriate DME       4. Pt will complete toilet hygiene with CGA using appropriate DME as  indicated.  Long Term Goals:   Time frame to achieve long term goal(s):   In 10-14 days       1. Pt will complete LB dress with SPV using AD as indicated       2. Pt will complete oral care/grooming tasks standing at  sink side for  approx 7 minutes with SPV using AD as indicated.       3. Pt will complete toilet transfer with SPV using appropriate DME       4. Pt will complete toilet hygiene with SPV using appropriate DME as  indicated.       5. Pt's family/caregiver will attend family training and demo ability to  safely and independently provide recommended level of assist needed for ADLs and  functional mobility at discharge    CARE Tool  The scores below reflect the patient's usual function prior to  therapeutic  intervention.    Self-Care Functional Assessment    Oral Hygiene: 3 - Not attempted due to medical or safety concerns.    Toileting Hygiene: 03 - Partial/Moderate Assistance: Helper does 1-25% of the  activity  Toileting Equipment:   Nurse, mental health):   Art gallery manager Self: 03 - Partial/Moderate Assistance: Helper does 1-25% of the  activity  Location:  Shower.  Assistive Device(s):   Grab bar/arm rest to maintain balance, Hand held shower,  shower chair    Upper Body Dressing:   04 - Supervision: Helper provides supervision for safety or verbal cues ONLY  Assistive Device(s):   None    Lower Body Dressing: 03 - Partial/Moderate Assistance: Helper does 1-25% of the  activity  Assistive Device(s):   Assistive device for item retrieval/balance    Putting On/Taking Off Footwear: 03 - Partial/Moderate Assistance: Helper does  1-25% of the activity  Assistive Device(s):   None    Mobility Functional Assessment    Chair/Bed-to-Chair Transfer: 03 - Partial/Moderate Assistance: Helper does 1-25%  of the activity  Assistive Device(s):   None    Toilet Transfer: 03 - Partial/Moderate Assistance: Helper does 1-25% of the  activity  Assistive Device(s):   Grab bars    Picking Up Objects: 88 - Not attempted due to medical or safety concerns.    Self Care Discharge Goals:   Eating: Patient completed the activities by him/herself with no assistance from  a helper.   Oral Hygiene: Patient completed the activities by him/herself with no  assistance from a helper.   Toileting Hygiene: Patient completed the activities by him/herself with no  assistance from a helper.   Shower/Bathe Self: Helper provides verbal cues or touching/steadying assistance  as patient completes activity.   Upper Body Dressing: Patient completed the activities by him/herself with no  assistance from a helper.   Lower Body Dressing: Helper provides verbal cues or touching/steadying  assistance as patient completes activity.    Putting On/Taking Off Footwear: Patient completed the activities by him/herself  with no assistance from a helper.   Toilet Transfer: Helper provides verbal cues or touching/steadying assistance  as patient completes activity.   Picking Up Object: Helper provides verbal cues or touching/steadying assistance  as patient completes activity.  Hearing, Speech, and Vision: Expression of Ideas and Wants: Frequently exhibits  difficulty with expressing needs and ideas.  Understanding Verbal and Non-Verbal Content: Usually Understands: Understands  most conversations, but misses some part/intent of message. Requires cues at  times to understand.    Risks/Benefits of Rehabilitation Discussed with Patient/Caregiver: Yes.  Recommendations/Goals for Rehabilitation Discussed with Patient/Caregiver: Yes.    PLAN  Occupational Therapy Plan: Occupational Therapy is recommended.  Recommended Frequency/Duration/Intensity: 60-120 min/day, 5-6 days/week, for  approx 10-14 days from evaluation on 04/13/2019; 1:1 therapy or group treatment  as appropriate.  Activities Contributing Toward Care  Plan: ADL/IADL retraining, ther ex, ther  act, modalities, DME recommendations, d/c planning, equipment, family  education/training, functional mobility/transfer training, energy conservation,  balance training, fall prevention training, functional activity tolerance  training, NMRE, cognitive retraining, ARMEO, RTI, Bioness  Patient is appropriate for treatment in mobility group. Patient will benefit  from group therapy to increase aerobic endurance capacity for ambulating longer  distances, to improve safety with use of appropriate AD while dividing  attention, improve negotiation of environmental barriers in a distracting  environment to simulate interaction with others in the community, provide  interaction with peers with similar diagnoses in order to improve coping and  therapeutic participation, enhance ability to direct care with  caregivers,  integrate and carry over functional social-pragmatic language skills, practice  communication and/or functional cognitive skills in a setting that mimics a real  world environment (e.g., work, outing with friends)., receive support and  constructive criticism from peers in a group setting, practice and carry over  skills learned in individual sessions with new partners, clinicians and  contexts, practice applying strategies to manage communication or cognitive  challenges in a distracting environment, experience modelling by peers    Team Care Plan  Please review Integrated Patient View Care Plan Flowsheet for Team identified  Problems, Interventions, and Goals.    Identified problems from team documentation:  Problem: Impaired Cognition  Cognition: Primary Team Goal: Pt will answer orientation and biographical  questions with >90% accuracy given min cuing in order to improve memory./    Problem: Safety Risk and Restraint  Safety: Primary Team Goal: Patient will recognized limitations and call for  assistance 100% of the time before getting out of bed./    Identified problems from this assessment:     Self Care Management : Pt will complete toilet transfers and all associated  tasks with supervision using AD as indicated to ensure safety.    Discipline:  Occupational Therapy    3 Hour Rule Minutes: 60 minutes of OT treatment this session count towards  intensity and duration of therapy requirement. Patient was seen for the full  scheduled time of OT treatment this session.  Therapy Mode Minutes: Individual: 60 minutes.    Signed by: Lorie Apley, OT 04/12/2020 11:00:00 AM

## 2020-04-12 NOTE — Progress Notes (Signed)
Referred to patient by RN Stark Klein.  Meet patient at bedside. Introduced Orthoptist role.  Patient shared that she is [place].  She attended a "full [employer] while living in [place].  She moved to her current assisted living last year, and has not connected with a faith community since.  Her faith is still important to her.  Chaplain offered prayer at her request.  She invited chaplain follow-up when possible.    Rev. Jackquline Berlin, M.Div., [facility]  Lead Chaplain for [facility] and [employer]  PGR [id number]  [phone]  [name]  In emergencies page [phone]    Chaplain Service      Assessment:  Spiritual Assessment: Coping with illness/hospitalization    Background:  Visit Type: Initial was made by Chaplain with patient, [name], based on Source: Nurse Referral.  Present at Visit: only the patient was present.  Spiritual Care Provided to: patient only.  Length of Visit: 16-30 minutes .    Summary:  Reason for Request: Spiritual Assessment,Emotional Support,Spiritual Support   Spiritual Care Interventions: Provided comfort, encouragement, affirmation,Provided reflective and compassionate listening,Provided prayer,Made a positive connection with patient,Provided Chaplaincy education   Spiritual Care Outcomes: Patient expressed appreciation of visit

## 2020-04-12 NOTE — Progress Notes (Signed)
CASE MANAGEMENT PROGRESS NOTE      Patient: Yolanda Cox (87 y.o. female)  Admission Date: 04/11/2020 [facility] Day 1)    Active Hospital Problems    Diagnosis    Debility       Length of stay: Hospital Day 1      CM met with the patient at bedside, case opened to [facility] services chart reviewed.     IDPA to be completed in Medilinks.      Team conferences will be held Thursday 04/14/19 at 11:00 am to discuss current functional status, barriers to discharge, estimated length of stay, and discharge recommendations.     CM will continue to follow.        Ms. Dallie Piles, MSW   Rehabilitation Case Manager  [facility] Inpatient Acute Rehab   [address]   [place], [place] [address]  [place] [phone]

## 2020-04-12 NOTE — Rehab Progress Note (Medilinks) (Signed)
Yolanda Cox  MRN: [id number]  Account: [id number]  Session Start: 04/12/2020 12:00:00 AM  Session Stop: 04/12/2020 12:00:00 AM    Total Treatment Minutes:  Minutes    Rehabilitation Nursing  Inpatient Rehabilitation Shift Assessment    Rehab Diagnosis: Neurological Condtions  Brain injury  Demographics:            Age: 32Y            Gender: Female  Primary Language: English    Date of Onset:  04/09/2020  Date of Admission: 04/11/2020 5:40:00 PM    Rehabilitation Precautions Restrictions:   Fall precaution, seizure precaution    Patient Report: " I am ok "  Patient/Caregiver Goals:  " To get stronger"    Wounds/Incisions: See Epic    Medication Review: No clinically significant medication issues identified this  shift.    Bowel and Bladder Output:                       Bladder (# only)             Bowel (# only)  Number of Episodes  Continent            3                            1  Incontinent          0                            0    Additional Education Provided:    Education Provided: Precautions. Pain management. Pain scale. Medication  options. Side effects. Plan of care. Safety issues and interventions. Fall  protocol. Supervision requirements. Use of adaptive devices. Skin/wound care.  Prevention of skin breakdown. Safety. Medication. Name and dosage.  Administration. Purpose. Diabetes. Hypoglycemia causes/symptoms/treatment.  Hyperglycemia causes/symptoms/treatment.       Audience: Patient.       Mode: Explanation.       Response: Verbalized understanding.    TEAM CARE PLAN  Identified problems from team documentation:  Problem: Impaired Cognition  Cognition: Primary Team Goal: Pt will answer orientation and biographical  questions with >90% accuracy given min cuing in order to improve memory./    Problem: Safety Risk and Restraint  Safety: Primary Team Goal: Patient will recognized limitations and call for  assistance 100% of the time before getting out of bed./    Please review Integrated Patient  View Care Plan Flowsheet for Team identified  Problems, Interventions, and Goals.    Signed by: Judie Petit, RN 04/12/2020 2:01:00 PM

## 2020-04-12 NOTE — Rehab Evaluation ([facility]) (Addendum)
Corrected 04/13/2020 7:51:26 AM    NAME: Yolanda Cox  MRN: [id number]  Account: [id number]  Session Start: 04/12/2020 9:00:00 AM  Session Stop: 04/12/2020 10:00:00 AM    Total Treatment Minutes: 60.00 Minutes    Speech Language Pathology  Inpatient Rehabilitation Language Cognitive-Dysphagia Evaluation    Rehab Diagnosis: Neurological Conditions  Brain injury  Demographics:            Age: 87Y            Gender: Female  Primary Language: English    Past Medical History: Anemia  Fibromyalgia  Hyperlipidemia  hypothyroid  Osteoporosis  Rheumatoid  TIA (transient ischemic attack)    PSH  CHOLECYSTECTOMY  History of Present Illness: Yolanda Cox is a 87 y.o. female with a  medical history of anemia fibromyalgia HLD hypothyroid osteoporosis rheumatoid  arthritis TIA and cerebral hemorrhage  who was brought in by EMS to [facility] status  post fall and head injury at the [employer] retirement community. Per EMR,  patient was walking at the retirement community that she is a resident of when  she fell forward striking her forehead on the floor, patient had been reportedly  nauseated prior to the fall. Per EMR, patient had a change in her mental status  while in route to the hospital. At this time patient has a flat affect and very  delayed responses, patient is alert to self and place. Patient reported a  headache and points to the back of her head and reports of a backache. While in  the ER she had a episode of tonic-clonic seizure as described by her daughter.  Patient has history of traumatic brain bleed in [place] about a year ago.   She also has history of normal pressure hydrocephalus. She lives alone in [place] independent living in a 1 level home with no steps to enter or inside.  Prior to her recent decline she was independent for all mobility and ADLs.  Currently she has min a to mod a for bed mobility and transfers, She waslked 20  feet mod a with a rw. She is min a for grooming. She has  mild-moderately  decreased cognitive/linguitci skills characterized by decreased  attention/concentration, short term memory, and executive functioning. The  patient has been working well with therapy services to include PT, OT, and SLP  to address functional mobility, self-care deficit, cognition, and swallowing.  The patient would benefit from an intensive level of rehab along with close  medical management of multiple medical conditions and rehab MD to drive the  rehab process.  The patient is medically stable for IRF level of care.   Date of Onset: 04/09/2020   Date of Admission: 04/11/2020 5:40:00 PM  Premorbid Functional Level: was living alone within retirement community [facility]). Was independent in all ADLs. Will follow up with daughter regarding  iADLs/driving status.  Social/Educational History: Widow x8-9 years. From [place], relocated to  [place] last year to be closer to her daughter. She currently lives in [place], [place]  within the [employer]. Prior to retirement, she reports working for  [employer]. She has 2 adult daughters [name] /T/ Eunice Blase). [name] lives locally and  Debbie lives in [place].  Home Environment: please see OT/PT evaluations for more details on home setup    Medications and Allergies: Significant rehabilitation considerations:   see EPIC  Rehabilitation Precautions/Restrictions:   Fall precaution, seizure precaution    SUBJECTIVE  Patient/Caregiver Goals:  Patient's functional goals: " To get stronger"  Understanding of Current Condition: Fair understanding of medical and functional  status, able to recognize changes in physical and cognitive abilities.  Pain: Patient currently without complaints of pain.  Pain Reassessment: Pain was not reassessed as no pain was reported.    OBJECTIVE  Vision and Hearing: Bilateral hearing aids used during evaluation; however were  not always effective, needing frequent repetition. Pt reports vision  difficulties, but did not have glasses  in the room. Suspect lingering visual  deficits from prior TBI (possible field cut), but will rely on OT testing to  determine.    Oral Motor Exam: Symmetrical. Natural and intact dentition. WFL ROM,  coordination, and strength.  Swallow:   Clinical Assessment: Bedside swallow exam consisting of regular solids x2 and  thin liquids x5. Oral phase relatively intact, c/b adequate oral containment,  timely mastication, and prompt AP transit. Pharyngeal phase WFL, as no s/s of  aspiration were present on any textures or consistencies.   Recommended Diet: Regular diet. Thin liquids.   Swallow Precautions: Upright position, Small bite/sip, Multiple swallows,  Alert, Oral care after meals, Alternate solid/liquid, Limit distractions    Language/Cognitive Tests Administered:  Results from CLQT are as follows:    Personal Facts- 4/8  Symbol Cancellation- 12/12  Confrontation Naming- 10/10  Clock Drawing- 6/13  Story Retelling- 8/10  Symbol Trails- 6/10  Generative Naming- 3/9  Design Memory- 3/6  Mazes- 0/8  Design Generation- 8/13    Cognitive Domain Scores  Attention- 156 (Mild)  Memory- 109 (Moderate)  Executive Functions- 17 (Mild)  Language- 25 (Mild)  Visuospatial Skills- 56 (Mild)    Communication:   Speech c/b low volume, slow rate, and reduced intonation; however does not  impact intelligibly at the conversation level. Expressive / receptive language  are intact, with no moments of anomia and was able to follow simple instructions  with repetition. Repetition needed for cognitive deficits and hearing loss vs.  auditory comprehension. Reading and writing skills were not assessed on this  date and will continue to monitor during stay in [place].    Cognition:   Attention:       Mild deficits per results of CLQT. Relatively strong sustained  attention, as pt did not require any redirections throughout 60-minute session.  Breakdowns observed in L-attention and alternating/divided attention. Suspect  visual deficits also  playing a role in visual tasks, which could have skewed  results.   Processing: At-least mild delays in processing, needing frequent repetition of  instructions and extra time for completion of tasks. Hearing loss also playing a  role in ability to effectively process auditory details.   Behavior: Flat affect and at times was exhibiting self-limiting behaviors. Pt  often stopping before time was called and stating "I can't do it."   Memory:       Moderate impairments in memory per results of CLQT. Pt is Ox3  (self, place, and situation); however unable to recall date or year. Pt stating  it is January 2002 and the president is Earl Lites. Breakdowns also observed  in delayed recall and short/long term memory.   Problem Solving/Reasoning: Was not formally testing, although anticipate  at-least mild deficits in this area based on informal observations.   Awareness/Judgment: Fair understanding of medical diagnosis and  physical/cognitive limitations; however it was difficulty for her to recall  whether these problems are new or related to prior TBI x1 year ago.   Executive Functioning: Mild deficits  per results of the CLQT. Breakdowns  observed in planning, organization, and mental flexibility. Marland Kitchen      CARE Tool  The scores below reflect the patient's usual function prior to therapeutic  intervention.    Hearing, Speech, Vision: Expression of Ideas and Wants: Expresses complex  messages without difficulty and with speech that is clear and easy to  understand.  Understanding Verbal and Non-Verbal Content: Sometimes Understands: Understands  only basic conversations or simple, direct phrases. Frequently requires cues to  understand.    Interventions: None provided today.  Patient was returned to or left in bed. Handoff to nurse completed.  Bed alarm in place and activated.  Oriented to call bell and placed within reach.  Personal items within reach.  Assistive devices positioned out of reach.  Bed placed in lowest  position.  Interdisciplinary Educational Needs and Learning Preferences:  Education not  assessed/provided this session.    ASSESSMENT  Pt is a 87 yo F who presents to [facility] s/p a fall within her [facility] community  [facility] MRI did not reveal any acute changes; however she does have a  history of TBI x1 year ago and recent seizure activity (in the ED after her  fall). Initial ST evaluation revealed mild-moderate cognitive deficits in the  areas of processing, L-attention, memory, and executive functioning. Pt is  currently Ox3 and struggles to recall biographical details (age/address),  current events (President), and temporal concepts (date/year). Cognitive  presentation is further complicated by hearing loss and suspected visual  component. The impairments mentioned above will impact the pt's ability to live  independently and complete ADLs/iADLs, therefore would benefit from skilled ST  while in [place].    Rehab Potential: Able to participate in an intensive inpatient interdisciplinary  rehabilitation program, Good family/social support, Good premorbid functional  status, Living in the community premorbidly, Motivated  Barriers to Progress/Discharge:  cognitive deficits    Short Term Goals: Time frame to achieve short term goal(s):  1-week       1. Pt will answer orientation and biographical questions with >90% accuracy  given min cuing in order to improve memory.       2. Pt will locate pertinent details within functional text (e.g. medicine  labels / bills / recipes) with 80% accuracy given min-mod cuing in order to  improve L-attention and visual scanning skills.       3. Pt will complete simple medication management tasks with 80% accuracy  given min-mod cuing in order to improve executive functioning skills.  Long Term Goals:  Time frame to achieve long term goal(s):  2-weeks       1. Pt will recall 3-4 newly presented details with 80% accuracy given min  cuing in order to improve memory skills.        2. Pt will locate pertinent details within functional text (e.g. medicine  labels / bills / recipes) with 90% accuracy given min cuing in order to improve  L-attention and visual scanning skills.       3. Pt will complete simple medication management tasks with 90% accuracy  given min cuing in order to improve executive functioning skills.    Risks/Benefits of Rehabilitation Discussed with Patient/Caregiver: Yes.  Recommendations/Goals for Rehabilitation Discussed with Patient/Caregiver: Yes.    PLAN  Speech Pathology Plan: Speech Language Pathology is recommended to address:  Recommended Frequency/Duration/Intensity: 60-120 minutes 1-2x day, 5-6x week,  for 10-14 days  Activities Contributing Toward Care Plan: dynamic assessment, neuro-cognitive  retraining,  memory strategies, finance/medication management exercises, external  aids, brain injury education, family training, and d/c planning  The patient is appropriate for group treatment. Patient will benefit from group  therapy to practice communication and/or functional cognitive skills in a  setting that mimics a real world environment (e.g., work, outing with friends).,  receive support and constructive criticism from peers in a group setting,  practice and carry over skills learned in individual sessions with new partners,  clinicians and contexts, practice applying strategies to manage communication or  cognitive challenges in a distracting environment, experience modeling by peers    TEAM CARE PLAN  Please review Integrated Patient View Care Plan Flowsheet for Team identified  Problems, Interventions, and Goals.    Identified problems from team documentation:  Problem: Safety Risk and Restraint  Safety: Primary Team Goal: Patient will recognized limitations and call for  assistance 100% of the time before getting out of bed./    Identified problems from this assessment:     Cognition : Pt will answer orientation and biographical questions with >90%  accuracy  given min cuing in order to improve memory.    Discipline:  Speech/Language Pathology    3 Hour Rule Minutes: 60 minutes of SLP treatment this session count towards  intensity and duration of therapy requirement. Patient was seen for the full  scheduled time of SLP treatment this session.  Therapy Mode Minutes: Individual: 60 minutes.    * Original [address] Cognitive Scale co-authored by Bonney Aid, Ph.D.,  Margot Chimes, M.A., Lanier Ensign, M.A., [facility], [place].  Revised 02/13/73 by Margot Chimes, M.A., and Vernie Shanks, O.T.R.    Signed by: Mikael Spray, CCC/SLP 04/12/2020 10:00:00 AM

## 2020-04-13 DIAGNOSIS — I10 Essential (primary) hypertension: Secondary | ICD-10-CM

## 2020-04-13 LAB — MRSA CULTURE
Culture MRSA Surveillance: NEGATIVE
Culture MRSA Surveillance: NEGATIVE

## 2020-04-13 LAB — URINALYSIS
Bilirubin, UA: NEGATIVE
Blood, UA: NEGATIVE
Glucose, UA: 500 — AB
Ketones UA: NEGATIVE
Leukocyte Esterase, UA: NEGATIVE
Nitrite, UA: NEGATIVE
Protein, UR: NEGATIVE
Specific Gravity UA: 1.025 (ref 1.001–1.035)
Urine pH: 6.5 (ref 5.0–8.0)
Urobilinogen, UA: 1 mg/dL (ref 0.2–2.0)

## 2020-04-13 LAB — GLUCOSE WHOLE BLOOD - POCT
Whole Blood Glucose POCT: 164 mg/dL — ABNORMAL HIGH (ref 70–100)
Whole Blood Glucose POCT: 229 mg/dL — ABNORMAL HIGH (ref 70–100)
Whole Blood Glucose POCT: 249 mg/dL — ABNORMAL HIGH (ref 70–100)
Whole Blood Glucose POCT: 266 mg/dL — ABNORMAL HIGH (ref 70–100)

## 2020-04-13 NOTE — Rehab Progress Note ([facility]) (Signed)
Yolanda Cox  MRN: [id number]  Account: [id number]  Session Start: 04/13/2020 12:00:00 AM  Session Stop: 04/13/2020 12:00:00 AM    Total Treatment Minutes:  Minutes    Case Management  Inpatient Rehabilitation Team Conference    Conference Date/Time: 04/13/2020 11:08:00 AM    Demographics            Age: 78Y            Gender: Female    Admission Date: 04/11/2020 5:40:00 PM  Diagnosis: Closed head injury after a fall, Mild anterior wedge compression  fracture of T7  Comorbidities:    VITAL SIGNS  Blood Pressure: 152/75 mmHg  Temperature:  degrees  Pulse:  beats per minute  Respirations:  breaths per minute  Pain: 8/10    WEIGHT and NUTRITION  Admission Weight:  pounds; Current Weight: pounds  Weight Change since Admit:  Food Consistency:  Liquid Consistency:    Plan Of Care  Anticipated Discharge Date/Estimated Length of Stay: 04/25/2020  Anticipated Discharge Destination: Community discharge with assistance  Discharge Plan : Home  Fall Risk Level: Yellow (Medium)  Case Management: Oretha Milch, CM  Nurse: Bertram Denver, RN  Occupational Therapy: Lorie Apley, OT  Physical Therapy: Juluis Pitch, PT  Physician: Gillis Santa, MD  Speech Language Pathology: Mikael Spray, SLP    The following is a list of patient problems that have been identified by the  interdisciplinary team:    Problem: Impaired Cognition  Cognition Status Update: Pt can answer orientation and biographical questions  with 70% accuracy given mod cues.  Team Identified Barrier to Discharge: No  Interventions:  Decrease environmental stimuli: Active  Safety awareness training: Active  Compensatory strategies: Active  Cognition: Primary Team Goal/Status: Pt will answer orientation and biographical  questions with >90% accuracy given min cuing in order to improve memory. /    Problem: Impaired Mobility  Mobility Status Update: Min A for transfers, Min A for ambulation using FWW, bed  mobility SUPV w/ bed rail  Interventions:  Transfer  training: Active  Gait training: Active  Wheelchair propulsion and management: Active  Mobility: Primary Team Goal/Status: Pt will correctly demonstrate safe  sit<>stand transfers and ambulation using FWW under direct SUPV or touch assist  via clinical team. / Active    Problem: Impaired Self-care Mgmt/ADL/IADL  Team Identified Barrier to Discharge: Yes  Interventions:  Adaptive equipment training: Active  Compensatory strategies: Active  Encourage patient to participate in activities of daily living: Active  Self Care: Primary Team Goal/Status: Pt will complete toilet transfers and all  associated tasks with supervision using AD as indicated to ensure safety. /    Problem: Safety Risk and Restraint  Safety Risk Status Update: Pt calls for assistance. Pt is yellow on the fall  risk scale.  Team Identified Barrier to Discharge: Yes  Interventions:  Identify patient at risk for falls: Active  Call light within reach: Active  Orient patient/family/caregiver to room and equipment: Active  Stay with patient in bathroom: Active  Bed alarm/wheelchair alarm: Active  Anticipate needs, including personal items within reach: Active  Safety: Primary Team Goal/Status: Patient will recognized limitations and call  for assistance 100% of the time before getting out of bed. / Active    Self Care Functional Status  Eating:  5 - Setup or clean-up assistance  Oral Hygiene:  55 - Not attempted due to medical condition or safety concern  Toileting:  3 - Partial/moderate assistance  Shower Bathe:  3 -  Partial/moderate assistance  Upper Body Dressing:  4 - Supervision or touching assistance  Lower Body Dressing:  3 - Partial/moderate assistance  Putting On/Taking Off Footwear:  3 - Partial/moderate assistance    Mobility Functional Status  Roll Left and Right:  6 - Independent without devices  Sit to Lying:  4 - Supervision or touching assistance  Lying to Sitting on Side of Bed:  4 - Supervision or touching assistance  Sit to Stand:  3 -  Partial/moderate assistance  Chair/Bed-to-Chair Transfer:  3 - Partial/moderate assistance  Toilet Transfer:  3 - Partial/moderate Naval architect:  3 - Partial/moderate assistance  Walk 10 Feet:  3 - Partial/moderate assistance  Walk 50 Feet with Two Turns:  3 - Partial/moderate assistance  Walking 150 Feet:  88 - Not attempted due to medical condition or safety concern  Walking 10 Feet on Uneven Surface:  88 - Not attempted due to medical condition  or safety concern  1 Step - Curb:  88 - Not attempted due to medical condition or safety concern  4 Steps:  9 - Not applicable  12 Steps:  9 - Not applicable  Picking Up an Object:  79 - Not attempted due to medical condition or safety  concern      Comments: None.    Signed by: Dallie Piles, MSW 04/13/2020 2:01:00 PM    Physician CoSigned By: Gillis Santa 04/13/2020 17:07:53

## 2020-04-13 NOTE — Rehab Evaluation ([facility]) (Signed)
[name]  MRN: [id number]  Account: [id number]  Session Start: 04/13/2020 3:00:00 PM  Session Stop: 04/13/2020 3:40:00 PM    Total Treatment Minutes:  Minutes    Therapeutic Recreation  Inpatient Rehabilitation Initial Evaluation    Risks/Benefits of Rehabilitation Discussed with Patient/Caregiver: Yes.    Rehab Diagnosis: Closed head injury after a fall, Mild anterior wedge  compression fracture of T7  Demographics:            Age: 87Y            Gender: Female  Primary Language: English    Past Medical History: Anemia  Fibromyalgia  Hyperlipidemia  hypothyroid  Osteoporosis  Rheumatoid  TIA (transient ischemic attack)    PSH  CHOLECYSTECTOMY  History of Present Illness: "Yolanda Cox is a 87 y.o. female with a  medical history of anemia fibromyalgia, HLD, hypothyroid, osteoporosis,  rheumatoid, arthritis, TIA, and cerebral hemorrhage who was brought in by EMS to  [facility] status post fall and head injury at the [employer] retirement community.  Per EMR, patient was walking at the retirement community that she is a resident  of when she fell forward striking her forehead on the floor, patient had been  reportedly nauseated prior to the fall. Per EMR, patient had a change in her  mental status while in route to the hospital. At this time patient has a flat  affect and very delayed responses, patient is alert to self and place. Patient  reported a headache and points to the back of her head and reports of a  backache. While in the ER she had a episode of tonic-clonic seizure as described  by her daughter.  Patient has history of traumatic brain bleed in [place]  about a year ago.  She also has history of normal pressure hydrocephalus. She  lives alone in [place] independent living in a 1 level home with no steps to  enter or inside. Prior to her recent decline she was independent for all  mobility and ADLs. Currently she has min a to mod a for bed mobility and  transfers, She waslked 20  feet mod a with a rw. She is min a for grooming. She  has mild-moderately decreased cognitive/linguitci skills characterized by  decreased attention/concentration, short term memory, and executive functioning.  The patient has been working well with therapy services to include PT, OT, and  SLP to address functional mobility, self-care deficit, cognition, and  swallowing.  The patient would benefit from an intensive level of rehab along  with close medical management of multiple medical conditions and rehab MD to  drive the rehab process.  The patient is medically stable for IRF level of  care."            Date of Onset: 04/09/2020            Date of Admission: 04/11/2020 5:40:00 PM    Medications and Allergies: Significant rehabilitation considerations:   see EPIC  Rehabilitation Precautions/Restrictions:   Fall precaution, skin precaution, seizure precaution, HOH, impaired vision,  delayed mental processing    SUBJECTIVE  Premorbid Functional Level: Patient reported Pt reports being independent with  ADLS prior to admission. Pt reports using a 4WW for ambulation at baseline  within her home and community. Pt does not drive, and reports that her daughter  or "the staff" at her retirement home/assisted living center are responsible for  providing her with food.  Understanding of Current Condition: Pt has  fair understanding of medical and  functional status, noting her impaired vision from her PMH has affected her  ability to participate in hobbies and recreational activities.  Patient/Caregiver Goals:  Patient's functional goals: "to get better"  Pain: Patient currently without complaints of pain.  Social History:  Marital Status: Widowed  Children: 2          Reside:  1 local  Employment Status:  retired  Investment banker, corporate:  NA    OBJECTIVE  Physical Limitations: Pt presents with decreased standing and seated balance  lateral lean, with significant fear of falling, decreased strength and  endurance,  decreased motor planning, cognitive deficits, HOH, and visual  deficits.    Cognition/Behavior: Per SLP eval, pt presents w mild-moderate cognitive deficits  in the areas of processing, L-attention, memory, and executive functioning. Pt  is currently Ox3 and struggles to recall biographical details (age/address),  current events (President), and temporal concepts (date/year). Cognitive  presentation is further complicated by hearing loss and suspected visual  component.    Leisure Interests:  Intellectual: None  Home Activities:  Television  Social:  Restaurants  Outdoor: None  Community Involvement: Religious involvement  Fitness/Sports: None  Creative: Knitting/Crocheting.  Other: Singing  Information Obtained From:  Pt and granddaughter    Pain Reassessment: Pain was not reassessed as no pain was reported.    Interdisciplinary Educational Needs and Learning Preferences:  Education not  assessed/provided this session.    ASSESSMENT  Summary of Deficits and Related Problems: Patient presents with the following  deficits: Decreased Activity Level. Decreased Awareness of Leisure  Modifications. Decreased Functional Mobility. Decreased Leisure Independence.  Decreased Orientation. Decreased Resource Awareness.  These deficits are secondary to: Impaired Strength. Impaired Coordination.  Cognitive Deficits.  Rehab Potential: Able to participate in an intensive inpatient interdisciplinary  rehabilitation program, Good family/social support, Good premorbid functional  status, Living in the community premorbidly  Barriers to Progress/Discharge:  Cognitive deficits    Long Term Goals: Time frame to achieve long term goal(s): 2 weeks       1. Pt will participate in a leisure activity of choice req. min assist for  30 minutes.  Short Term Goals: Time frame to achieve short term goal(s): 5 visits        1. Pt will improve strength and endurance as demonstrated through the  ability to perform and tolerate leisure tasks in  dayrooms setting for 30  minutes.    Recommendations/Goals for Rehabilitation Discussed with Patient/Caregiver: Yes.    Received pt in [facility] w granddaughter present. CTRS introduced self, explained role  of TR in rehab setting. Eval impacted by pt's hearing despite wearing hearing  aids. Inaccurate reporter of interests and daily routine, stated "I don't know  what I do all day". [place] and initiation of leisure tasks impacted by hearing  and vision deficits, but pt expressed interest in adapted crochet. Pt would  benefit from participation in TR interventions to address stregnth, cognition,  and leisure skills.    PLAN  Therapeutic Recreation services are recommended to address: Strength, endurance,  cognition, leisure modifications    Care Plan  Identified problems from team documentation:  Problem: Impaired Cognition  Cognition: Primary Team Goal: Pt will answer orientation and biographical  questions with >90% accuracy given min cuing in order to improve memory./    Problem: Impaired Mobility  Mobility: Primary Team Goal: Pt will correctly demonstrate safe sit<>stand  transfers and ambulation using FWW under direct SUPV or touch assist via  clinical team./Active    Problem: Impaired Self-care Mgmt/ADL/IADL  Self Care: Primary Team Goal: Pt will complete toilet transfers and all  associated tasks with supervision using AD as indicated to ensure safety./    Problem: Safety Risk and Restraint  Safety: Primary Team Goal: Patient will recognized limitations and call for  assistance 100% of the time before getting out of bed./Active    Identified problems from this assessment:     Leisure Skills : Pt and caregiver will describe opportunities and resources for  social engagement in the community.    Discipline:  Therapeutic Recreation    Please review Integrated Patient View Care Plan Flowsheet for Team identified  Problems, Interventions, and Goals.    Signed by: Sindy Messing, TR 04/13/2020 4:00:00 PM

## 2020-04-13 NOTE — Rehab Progress Note ([facility]) (Signed)
Yolanda Cox  MRN: [id number]  Account: [id number]  Session Start: 04/13/2020 10:00:00 AM  Session Stop: 04/13/2020 11:00:00 AM    Total Treatment Minutes: 60.00 Minutes    Speech Language Pathology  Inpatient Rehabilitation Progress Note - Brief    Rehab Diagnosis: Closed head injury after a fall, Mild anterior wedge  compression fracture of T7  Demographics:            Age: 80Y            Gender: Female  Rehabilitation Precautions/Restrictions:   Fall precaution, skin precaution, seizure precaution, HOH, impaired vision,  delayed mental processing    SUBJECTIVE  Patient Report: "I don't remember your name."  Pain: Patient currently has pain.  Patient reports a pain level of 8 out of 10. Pt refused medication but RN  notified at end of session.    OBJECTIVE    Interventions:       Speech Treatment: Goal setting activity completed to assess pt's  understanding of cognitive deficits and understanding of ST. Pt able to  recognize "memory" as an area to work on; however needed max cuing to develop a  specific goal. Goals recorded within rehab binder. Additionally, pt provided  with an another visual aid for goals in larger font d/t visual deficits.  Remainder of session targeting memory through review of orientation and  biographical detaisl. Pt scoring 23/30 on O-log, needing cuing for name of  hospital, date, and deficits s/p fall + seizures. Biographical questions were  answered with 80% accuracy given min cuing. Cuing needed for components of  address. Pt introduced to memory strategies (WRAP) to improve carry over of new  information; however pt with limited understanding of these concepts. Therefore  SLP recommends external aids as the only memory strategies at this time.    Patient was left seated in chair in his/her room. Handoff to nurse completed.  Bed alarm in place and activated.  Oriented to call bell and placed within reach.  Personal items within reach.  Assistive devices positioned out of  reach.  Pain Reassessment: Pain was not reassessed as no pain was reported.    Education Provided:    Education Provided: Plan of care. Cognitive functioning. Compensatory cognitive  strategies/aids       Audience: Patient.       Mode: Explanation.  Demonstration.  Teacher, English as a foreign language provided.       Response: Verbalized understanding.  Needs practice.  Needs reinforcement.    ASSESSMENT  Significant improvements in memory and recall of personal details. Limited  understanding of internal memory strategies; therefore would only benefit from  external aids at this time. Recommend introduction of memory book in next  session.    PLAN  Continued Speech Language Pathology is recommended to address:  Recommended Frequency/Duration/Intensity: 60-120 minutes 1-2x day, 5-6x week,  for 10-14 days  Continued Activities Contributing Toward Care Plan: dynamic assessment,  neuro-cognitive retraining, memory strategies, finance/medication management  exercises, external aids, brain injury education, family training, and d/c  planning    3 Hour Rule Minutes: 60 minutes of SLP treatment this session count towards  intensity and duration of therapy requirement. Patient was seen for the full  scheduled time of SLP treatment this session.  Therapy Mode Minutes: Individual: 60 minutes.    Signed by: Mikael Spray, CCC/SLP 04/13/2020 11:00:00 AM

## 2020-04-13 NOTE — Progress Notes (Signed)
MEDICINE PROGRESS NOTE  Macyn Shropshire R. Geoffery Spruce MD    Date Time: 04/13/20 7:52 AM  Patient Name: Yolanda Cox  Attending Physician: Arizona Constable, MD, MD    CC: Medical management    Interval History/24 hour events: Complaining of some pain in the scalp since the fall on the left side    Patient is an 87 y.o. right hand-dominant female with prior traumatic brain bleed, NPH, HTN, DM, HLD, hypothyroidism, RA, and osteoporosis who presented to the ED from [facility] assisted living after having a syncopal episode and hitting her head. Reportedly patient was altered while in the ambulance and had seizure activity while in the ED. CT head was done and showed no evidence of acute intracranial injury. EEG was done and was reported as normal. MRI brain showed moderate generalized intraparenchymal volume loss with moderately advanced chronic microvascular changes but no acute changes. Neurology was consulted and patient was started on keppra. She was found to have pneumonia, which was thought likely to be secondary to aspiration from seizure activity. She was started initially on IV abx and then transitioned to oral by the time of discharge. Per documentation the remainder of her hospitalization was unremarkable. She was evaluated by PT/OT/SLP and deemed appropriate for acute inpatient rehab and was transferred to the [facility] inpatient rehab unit on 04/11/2020.   Review of Systems:   Review of Systems - Negative except As in HPI      Physical Exam:   Patient Vitals for the past 24 hrs:   BP Temp Temp src Pulse Resp SpO2   04/13/20 0600 (!) 151/97 98.4 F (36.9 C) Oral 68 16 97 %   04/12/20 1605 134/72 97.3 F (36.3 C) Oral 65 16 96 %   04/12/20 0848 152/76 -- -- 63 18 95 %       Intake and Output Summary (Last 24 hours) at Date Time    Intake/Output Summary (Last 24 hours) at 04/13/2020 0752  Last data filed at 04/12/2020 2000  Gross per 24 hour   Intake 375 ml   Output 0 ml   Net 375 ml       General: awake, alert,  oriented x 1; no acute distress.  Cardiovascular: regular rate and rhythm, no murmurs, rubs or gallops  Lungs: clear to auscultation bilaterally, without wheezing, rhonchi, or rales  Abdomen: soft, non-tender, non-distended; no palpable masses, no hepatosplenomegaly, normoactive bowel sounds, no rebound or guarding  Extremities: no clubbing, cyanosis, or edema  Other: left side of scalp some tenderness    Meds:     Medications were reviewed:    Labs:     Results     Procedure Component Value Units Date/Time    MRSA culture - Throat [[id number]] Collected: 04/12/20 0526    Specimen: Culturette from Throat Updated: 04/13/20 0737     Culture MRSA Surveillance Negative for Methicillin Resistant Staph aureus    MRSA culture - Nares [[id number]] Collected: 04/12/20 0526    Specimen: Culturette from Nares Updated: 04/13/20 0737     Culture MRSA Surveillance Negative for Methicillin Resistant Staph aureus    Glucose Whole Blood - POCT [[id number]]  (Abnormal) Collected: 04/13/20 0725     Updated: 04/13/20 0728     Whole Blood Glucose POCT 164 mg/dL     Glucose Whole Blood - POCT [[id number]]  (Abnormal) Collected: 04/12/20 2049     Updated: 04/12/20 2059     Whole Blood Glucose POCT 247 mg/dL     Glucose  Whole Blood - POCT [[id number]]  (Abnormal) Collected: 04/12/20 1638     Updated: 04/12/20 1645     Whole Blood Glucose POCT 244 mg/dL     Glucose Whole Blood - POCT [[id number]]  (Abnormal) Collected: 04/12/20 1135     Updated: 04/12/20 1138     Whole Blood Glucose POCT 273 mg/dL     Glucose Whole Blood - POCT [[id number]]  (Abnormal) Collected: 04/12/20 0855     Updated: 04/12/20 0858     Whole Blood Glucose POCT 212 mg/dL             Imaging personally reviewed, including:       Assessment:     Patient Active Problem List   Diagnosis    Fall, initial encounter    Seizure after head injury    Debility           Plan:   -Continue Keppra 500 twice daily  -Finish course of Augmentin for aspiration pneumonia  -Continue amlodipine  for hypertension  -Blood sugar running high hemoglobin A1c was 7.6, (per daughter blood sugar goes and she is under stress doesn't want any oral hypoglycemic at present we'll monitor  -Was on methotrexate for rheumatoid arthritis as outpatient may resume soon  -On aspirin 325 for DVT prophylaxis        Disposition:   Today's date: 04/13/2020        Signed by: Arizona Constable, MD, MD

## 2020-04-13 NOTE — Rehab Progress Note ([facility]ilinks) (Signed)
Yolanda Cox  MRN: [id number]  Account: [id number]  Session Start: 04/13/2020 12:00:00 AM  Session Stop: 04/13/2020 12:00:00 AM    Total Treatment Minutes:  Minutes    Rehabilitation Nursing  Inpatient Rehabilitation Shift Assessment    Rehab Diagnosis: Closed head injury after a fall, Mild anterior wedge  compression fracture of T7  Demographics:            Age: 32Y            Gender: Female  Primary Language: English    Date of Onset:  04/09/2020  Date of Admission: 04/11/2020 5:40:00 PM    Rehabilitation Precautions Restrictions:   Fall precaution, skin precaution, seizure precaution, HOH, impaired vision,  delayed mental processing    Patient Report: "Okay".  Patient/Caregiver Goals:  "to get better"    Wounds/Incisions: See Epic    Medication Review: No clinically significant medication issues identified this  shift.    Bowel and Bladder Output:                       Bladder (# only)             Bowel (# only)  Number of Episodes  Continent            1                            0  Incontinent          0                            0    Additional Education Provided:    Education Provided: Pain management. Pain scale. Safety issues and  interventions. Fall protocol. Medication. Name and dosage. Administration.  Purpose. Side Effects.       Audience: Patient.       Mode: Explanation.       Response: Verbalized understanding.    TEAM CARE PLAN  Identified problems from team documentation:  Problem: Impaired Cognition  Cognition: Primary Team Goal: Pt will answer orientation and biographical  questions with >90% accuracy given min cuing in order to improve memory./    Problem: Impaired Mobility  Mobility: Primary Team Goal: Pt will correctly demonstrate safe sit<>stand  transfers and ambulation using FWW under direct SUPV or touch assist via  clinical team./    Problem: Impaired Self-care Mgmt/ADL/IADL  Self Care: Primary Team Goal: Pt will complete toilet transfers and all  associated tasks with supervision  using AD as indicated to ensure safety./    Problem: Safety Risk and Restraint  Safety: Primary Team Goal: Patient will recognized limitations and call for  assistance 100% of the time before getting out of bed./    Please review Integrated Patient View Care Plan Flowsheet for Team identified  Problems, Interventions, and Goals.    Signed by: Maximiano Coss, RN 04/13/2020 4:03:00 AM

## 2020-04-13 NOTE — Rehab Progress Note ([facility]) (Signed)
Yolanda Cox  MRN: [id number]  Account: [id number]  Session Start: 04/13/2020 12:00:00 AM  Session Stop: 04/13/2020 12:00:00 AM    Total Treatment Minutes:  Minutes    Rehabilitation Nursing  Inpatient Rehabilitation Shift Assessment    Rehab Diagnosis: Closed head injury after a fall, Mild anterior wedge  compression fracture of T7  Demographics:            Age: 46Y            Gender: Female  Primary Language: English    Date of Onset:  04/09/2020  Date of Admission: 04/11/2020 5:40:00 PM    Rehabilitation Precautions Restrictions:   Fall precaution, skin precaution, seizure precaution, HOH, impaired vision,  delayed mental processing    Patient Report: "I'm good. I have some head pain."  Patient/Caregiver Goals:  "to get better"    Wounds/Incisions: See Epic    Medication Review: No clinically significant medication issues identified this  shift.    Bowel and Bladder Output:                       Bladder (# only)             Bowel (# only)  Number of Episodes  Continent            2                            1  Incontinent          0                            0    Additional Education Provided:    Education Provided: Precautions. Pain management. Pain scale. Medication  options. Safety issues and interventions. Fall protocol. Supervision  requirements. Use of adaptive devices. Medication. Name and dosage.  Administration. Purpose.       Audience: Patient.       Mode: Explanation.  Demonstration.       Response: Applied knowledge.  Verbalized understanding.  Demonstrated skill.    TEAM CARE PLAN  Identified problems from team documentation:  Problem: Impaired Cognition  Cognition: Primary Team Goal: Pt will answer orientation and biographical  questions with >90% accuracy given min cuing in order to improve memory./    Problem: Impaired Mobility  Mobility: Primary Team Goal: Pt will correctly demonstrate safe sit<>stand  transfers and ambulation using FWW under direct SUPV or touch assist via  clinical  team./Active    Problem: Impaired Self-care Mgmt/ADL/IADL  Self Care: Primary Team Goal: Pt will complete toilet transfers and all  associated tasks with supervision using AD as indicated to ensure safety./    Problem: Safety Risk and Restraint  Safety: Primary Team Goal: Patient will recognized limitations and call for  assistance 100% of the time before getting out of bed./Active    Please review Integrated Patient View Care Plan Flowsheet for Team identified  Problems, Interventions, and Goals.    Signed by: Bertram Denver, RN 04/13/2020 5:05:00 PM

## 2020-04-13 NOTE — Rehab Progress Note (Medilinks) (Signed)
Yolanda Cox  MRN: [id number]  Account: [id number]  Session Start: 04/13/2020 2:00:00 PM  Session Stop: 04/13/2020 3:00:00 PM    Total Treatment Minutes: 60.00 Minutes    Occupational Therapy  Inpatient Rehabilitation Progress Note - Brief    Rehab Diagnosis: Closed head injury after a fall, Mild anterior wedge  compression fracture of T7  Demographics:            Age: 31Y            Gender: Female  Rehabilitation Precautions/Restrictions:   Fall precaution, skin precaution, seizure precaution, HOH, impaired vision,  delayed mental processing    SUBJECTIVE  Patient Report: "I think I need then to turn down the lights"  Pain: Patient currently has pain.  Patient reports a pain level of 6 out of 10. Patient medicated. Pt c/o head ache      OBJECTIVE  Vital Signs:                       Before Activity              After Activity  Vitals  Position/Activity    seated upright in a wc       -  BP Systolic          134                          -  BP Diastolic         79                           -  Pulse                69                           -  O2 Saturation        93%                          -    Interventions:       Self Care/Home Management:  Assessed pt's peripheral vision conducting  confrontation test with pt detecting fingers in all four quadrants with mild  delay. Pt received household functional mobility training using a FWW and CGA to  ensure safety. She required similar level of assistance to perform toileting  tasks and occ vc to walk inside the walker, and reach back for the chair to  ensure it is locked. Pt demonstrated ability to perform Fair+ dynamic standing  balance as she completed oral care tasks standing at sink side unsupported.  Pt  was observed with decreased motor planning, initiation and, delayed information  processing, Contacted pt's daughter with pt's permission and confirmed pt's  report. Pt's daughter confirmed current visual acuity problems presented at  baseline, as she had a  blood vessel burst in her R eye, and unsuccessful  cataract surgery in her L eye resulting in "cloudiness". Pt demon light  sensitivity and bed side RN was consulted with recommendation to put sign on the  door to alert staff members.  Patient was left seated in chair in his/her room. Handoff to nurse completed.  Chair alarm in place and activated.  Oriented to call bell and placed within reach.  Personal items within reach.  Assistive devices positioned out of  reach.  Pain Reassessment: Pain was not reassessed as no pain was reported.    Education Provided:    Education Provided: Activities of daily living. Functional transfers. Safety.       Audience: Patient.       Mode: Explanation.  Demonstration.       Response: Applied knowledge.  Verbalized understanding.  Demonstrated skill.  Needs practice.    ASSESSMENT  Pt responded well to the use of AD as she demonstrated ability to complete all  functional tasks with CGA as a compared to Min A on IE. Pt was able to complete  grooming task at sink side while standing for approx. 2 minutes, FWW,  intermittently unsupported. Pt is with decreased visual acuity, impaired spatial  relations, light sensitivity, and contrast sensitivity affecting her ability to  maintain safety. Pt will benefit from OT to improve motor reaction, alertness,  compensatory techniques (perhaps the use of an Ipad during leisure/education  time), balance, and functional transfers to maximize safety at time of [place] from  IR.    CARE Tool  Self-Care Functional Assessment    Toileting Hygiene: 04 - Touching Assistance: Helper provides  touching/steadying/contact guard  Toileting Equipment:   Scientist, research (physical sciences) Device(s):   Adaptive device to maintain balance, Grab bar    Mobility Functional Assessment    Toilet Transfer: 04 - Touching Assistance: Helper provides  touching/steadying/contact guard  Assistive Device(s):   Grab bars, Rolling walker    PLAN  Continued Occupational Therapy is  recommended.  Recommended Frequency/Duration/Intensity: 60-120 min/day, 5-6 days/week, for  approx 10-14 days from evaluation on 04/13/2019; 1:1 therapy or group treatment  as appropriate.  Continued Activities Contributing Toward Care Plan: ADL/IADL retraining, ther  ex, ther act, modalities, DME recommendations, d/c planning, equipment, family  education/training, functional mobility/transfer training, energy conservation,  balance training, fall prevention training, functional activity tolerance  training, NMRE, cognitive retraining, ARMEO, RTI, Bioness    3 Hour Rule Minutes: 60 minutes of OT treatment this session count towards  intensity and duration of therapy requirement. Patient was seen for the full  scheduled time of OT treatment this session.  Therapy Mode Minutes: Individual: 60 minutes.    Signed by: Lorie Apley, OT 04/13/2020 3:00:00 PM

## 2020-04-13 NOTE — Progress Notes (Signed)
PHYSICAL MEDICINE AND REHABILITATION  PROGRESS NOTE -- FACE-TO-FACE ENCOUNTER    Date Time: 04/13/20 1:40 PM  Patient Name: Yolanda Cox, Yolanda Cox    Admission date:  04/11/2020    Subjective:     No acute events overnight. Patient feels well today without current complaints. Says she was tired after therapy from working hard. Intermittent headache managed with PRN medications.    Functional Status:     PT:  Summary of Deficits and Prognosis: Pt is an 87 y/o female S/P a mechanical fall  resulting in a TBI. Pt's PLOF is MOD I using a FWW. During PT eval, pt required  SUPV for functional bed mobility including  rolling L<>R, and sit<>supine. Pt  requires Min A for sit<> stand transfers from [facility] and bed w/ increased time and  effort 2/2 to cognitive deficits. Pt performed sit<>stand from bed and no AD w/  Min assist to maximize safety. Pt able to perform independent sitting balance  for >20 seconds, demonstrating appropriate trunk control for static ADLs. During  ambulation w/ FWW, pt presents w/ step through pattern w/ decreased step-length  and cadence. Pt showed no LOB during gait assesment using FWW. Pt demonstrates  self selected gait speed of 0.21 m/s on the , indicative of limited  household ambulation capacity. Recommending skilled PT care with a focus on  transfers, balance, and gait training to improve functional mobility, increase  independence, and return to PLOF.  Rehab Potential: Good family/social support, Good premorbid functional status,  Living in the community premorbidly Pt living in assisted living or retirement  center prior to rehab admission, which provides assistance w/ household tasks  such as cooking for the residents.  Barriers to Progress/Discharge: Social/cultural barriers, Poor activity  tolerance, Functional status, Medical condition    OT:  Summary of Deficits and Prognosis: Yolanda Cox is an 87 year old female  with above-mentioned past medical and surgical history  admitted to [facility] on  04/11/2020 s/p a fall resulted in head injury. Pt presented to OT eval with  decreased standing and seated balance lateral lean, with significant fear of  falling, decreased strength and endurance, decreased motor planning, cognitive  deficits, HOH, and visual deficits all negatively affecting her ability to  complete her self care tasks safely and independently. Pt currently requires Min  A for many of her self care and mobility tasks. Pt will benefit from OT services  using multi-disciplinary approach to maximize independence and safety with  self-care tasks. Pt lives in an ILF and will be most likely need supervision  post IR to prevent future falls and ensure safety. ELOS is 10-14 days to allow  pt to advance within her POC, reduce care giver burden and prepare for a safe  [place].    SLP:  Significant improvements in memory and recall of personal details. Limited  understanding of internal memory strategies; therefore would only benefit from  external aids at this time. Recommend introduction of memory book in next  session.    Medications:   Medication reviewed by me:     Scheduled Meds: PRN Meds:   amLODIPine, 10 mg, Oral, Daily  amoxicillin-clavulanate, 1 tablet, Oral, Q12H [facility]  aspirin, 325 mg, Oral, Daily  atorvastatin, 40 mg, Oral, QHS  calcium citrate-vitamin D, 2 tablet, Oral, Daily  folic acid, 1 mg, Oral, Daily  insulin lispro, 1-3 Units, Subcutaneous, QHS  insulin lispro, 1-5 Units, Subcutaneous, TID AC  lactobacillus/streptococcus, 1 capsule, Oral, Daily  levETIRAcetam, 500 mg, Oral, Q12H [facility]  levothyroxine, 75 mcg, Oral, Daily at 0600  multivitamin, 1 tablet, Oral, Daily  sertraline, 50 mg, Oral, Daily  vitamin D, 25 mcg, Oral, Daily        Continuous Infusions:   acetaminophen, 650 mg, Q6H PRN  albuterol-ipratropium, 3 mL, Q6H PRN  butalbital-acetaminophen-caffeine, 1 tablet, Q4H PRN  dextrose, 15 g of glucose, PRN   And  dextrose, 12.5 g, PRN   And  glucagon (rDNA), 1 mg,  PRN  magnesium hydroxide, 30 mL, Daily PRN  melatonin, 3 mg, QHS PRN  ondansetron, 4 mg, Q8H PRN  senna-docusate, 2 tablet, QHS PRN          Medication Review  1. A complete drug regimen review was completed: Yes  2. Were any drug issues found during review?: No      Review of Systems:   A comprehensive review of systems was: No fevers, chills, nausea, vomiting, chest pain, shortness of breath, cough, headache, double vision.  All others negative.    Physical Exam:     Vitals:    04/12/20 0848 04/12/20 1605 04/13/20 0600 04/13/20 0833   BP: 152/76 134/72 (!) 151/97 152/75   Pulse: 63 65 68 70   Resp: 18 16 16     Temp:  97.3 F (36.3 C) 98.4 F (36.9 C) 98.2 F (36.8 C)   TempSrc:  Oral Oral Oral   SpO2: 95% 96% 97% 96%   Weight:       Height:           Intake and Output Summary (Last 24 hours) at Date Time    Intake/Output Summary (Last 24 hours) at 04/13/2020 1340  Last data filed at 04/13/2020 0910  Gross per 24 hour   Intake 375 ml   Output 0 ml   Net 375 ml     P.O.: 300 mL (04/13/20 0910)     Urine: 0 mL (04/13/20 0835)           General: WN/WD, in NAD, seen sitting in chair at bedside  HEENT: NC/AT, moist mucosal membranes, anicteric sclera  Cardiac: regular rate, extremities warm and well perfused  Resp: non-labored breathing, acyanotic  Abdomen: soft, non-tender, non-distended  Neuro: Awake and alert, oriented to person and place, knows the month and year but not day, speech fluent and spontaneous without dysarthria but is slowed, mild proximal weakness but moves extremities with AG strength throughout    Labs:   No results for input(s): GLUCOSEWHOLE in the last 24 hours.    Recent Labs   Lab 04/12/20  0526 04/11/20  0455 04/10/20  0519 04/08/20  1639   WBC 8.42 11.34* 18.92* 20.05*   Hgb 10.5* 10.4* 10.2* 11.0*   Hematocrit 32.1* 31.5* 31.4* 34.1*   Platelets 177 164 149 185        Recent Labs   Lab 04/12/20  0526 04/11/20  0455 04/10/20  0519 04/09/20  0914   Sodium 140 141 138 138   Potassium 3.3* 3.8  3.8 4.0   Chloride 106 106 105 105   CO2 26 24 22 24    BUN 11.0 15.4 13.0 15.3   Creatinine 0.7 0.8 0.8 0.8   Calcium 9.4 8.7 8.8 8.9   Albumin  --   --   --  3.9   Protein, Total  --   --   --  7.0   Bilirubin, Total  --   --   --  1.6*   Alkaline Phosphatase  --   --   --  57   ALT  --   --   --  11   AST (SGOT)  --   --   --  20   Glucose 164* 226* 164* 208*   Magnesium  --   --   --  2.1   Phosphorus  --   --   --  3.2       Recent Labs   Lab 04/08/20  1639   PT INR 1.1       Results     Procedure Component Value Units Date/Time    Glucose Whole Blood - POCT [[id number]]  (Abnormal) Collected: 04/13/20 1123     Updated: 04/13/20 1127     Whole Blood Glucose POCT 229 mg/dL     MRSA culture - Throat [[id number]] Collected: 04/12/20 0526    Specimen: Culturette from Throat Updated: 04/13/20 0737     Culture MRSA Surveillance Negative for Methicillin Resistant Staph aureus    MRSA culture - Nares [[id number]] Collected: 04/12/20 0526    Specimen: Culturette from Nares Updated: 04/13/20 0737     Culture MRSA Surveillance Negative for Methicillin Resistant Staph aureus    Glucose Whole Blood - POCT [[id number]]  (Abnormal) Collected: 04/13/20 0725     Updated: 04/13/20 0728     Whole Blood Glucose POCT 164 mg/dL     Glucose Whole Blood - POCT [[id number]]  (Abnormal) Collected: 04/12/20 2049     Updated: 04/12/20 2059     Whole Blood Glucose POCT 247 mg/dL     Glucose Whole Blood - POCT [[id number]]  (Abnormal) Collected: 04/12/20 1638     Updated: 04/12/20 1645     Whole Blood Glucose POCT 244 mg/dL                Rads:   Radiological Procedure reviewed.  Radiology Results (24 Hour)     ** No results found for the last 24 hours. **              Assessment and Plan:     87yo F with prior traumatic brain bleed, NPH, HTN, DM, HLD, hypothyroidism, RA, and osteoporosis presented after syncopal event and was found to have aspiration pneumonia thought to be secondary to seizures    #Rehab  - continue PT/OT/SLP  - 1/13 patient was  discussed this morning in team conference, see medilinks notes for details    #New onset seizures  - patient with syncopal episode in assisted living facility and then seizure activity while in ED  - spot EEG negative  - CT head and MRI brain negative for acute intracranial process  - seen by neurology, Dr. Marney Doctor while in acute hospital  - seizure precautions  - continue with keppra 500mg  BID    #Hx of traumatic brain bleed/NPH  - CT and MRI showing chronic changes only; without evidence of hydrocephalus  - does have some cognitive impairments that may be secondary to this prior injury  - SLP will be working with patient on cognition    #Aspiration pneumonia  - thought to be secondary to seizure activity  - was on zosyn while in acute hospital and transitioned to augmentin to complete 5 day course on admission to rehab, stop date 1/16  - probiotic, incentive spirometer, duonebs PRN  - will need repeat CXR in 4-6 for resolution    #HTN  - amlodipine 10mg     #DM  - HbA1c 7.6  - per daughter the patient's blood sugar elevates when she is  stressed and then it returns to normal, decision was made to hold off on starting an oral agent  - continue SSI    #HLD  - atorvastatin 40mg     #Hypothyroidism  - levothyroxine    #Anemia  - likely secondary to chronic disease  - Hb 10.5 on admission, continue to monitor with intermittent cbc    #T7 compression fracture  - age indeterminate with no acute findings on exam, continue to monitor    #Depression  - zoloft 50mg     #Rheumatoid arthritis  - on methotrexate qweekly, will confirm date and dose, likely start after abx completed    #Osteoporosis  - citracal-vitamin D 2 tab daily    #DVT ppx  - ASA 325    #Dispo  EDD 1/25    Patient Active Problem List   Diagnosis    Fall, initial encounter    Seizure after head injury    Debility       Continue comprehensive and intensive inpatient rehab program, including:   Physical therapy 60-120 min daily, 5-6 times per week,  Occupational therapy 60-120 min daily, 5-6 times per week, Case management and Rehabilitation nursing    Signed by: Charlestine Massed, MD MD    [facility]    If there are questions or concerns about the content of this note or information contained within the body of this dictation they should be addressed directly with the author for clarification.

## 2020-04-13 NOTE — Rehab Evaluation (Medilinks) (Signed)
Yolanda Cox  MRN: [id number]  Account: [id number]  Session Start: 04/12/2020 12:00:00 AM  Session Stop: 04/13/2020 12:00:00 AM    Total Treatment Minutes:  Minutes    Case Management  Inpatient Rehabilitation Initial Assessment    Rehab Diagnosis: Neurological Condtions  Brain injury  Demographics:            Age: 87Y            Gender: Female    Past Medical History: Anemia  Fibromyalgia  Hyperlipidemia  hypothyroid  Osteoporosis  Rheumatoid  TIA (transient ischemic attack)    PSH  CHOLECYSTECTOMY  History of Present Illness: Yolanda Cox is a 87 y.o. female with a  medical history of anemia fibromyalgia HLD hypothyroid osteoporosis rheumatoid  arthritis TIA and cerebral hemorrhage  who was brought in by EMS to [facility] status  post fall and head injury at the [employer] retirement community. Per EMR,  patient was walking at the retirement community that she is a resident of when  she fell forward striking her forehead on the floor, patient had been reportedly  nauseated prior to the fall. Per EMR, patient had a change in her mental status  while in route to the hospital. At this time patient has a flat affect and very  delayed responses, patient is alert to self and place. Patient reported a  headache and points to the back of her head and reports of a backache. While in  the ER she had a episode of tonic-clonic seizure as described by her daughter.  Patient has history of traumatic brain bleed in [place] about a year ago.   She also has history of normal pressure hydrocephalus. She lives alone in [place] independent living in a 1 level home with no steps to enter or inside.  Prior to her recent decline she was independent for all mobility and ADLs.  Currently she has min a to mod a for bed mobility and transfers, She waslked 20  feet mod a with a rw. She is min a for grooming. She has mild-moderately  decreased cognitive/linguitci skills characterized by  decreased  attention/concentration, short term memory, and executive functioning. The  patient has been working well with therapy services to include PT, OT, and SLP  to address functional mobility, self-care deficit, cognition, and swallowing.  The patient would benefit from an intensive level of rehab along with close  medical management of multiple medical conditions and rehab MD to drive the  rehab process.  The patient is medically stable for IRF level of care.   Date of Onset: 04/09/2020   Date of Admission: 04/11/2020 5:40:00 PM    Premorbid Functional Level: Pt reports being independent with ADLS prior to  admission. Pt reports using a 4WW for ambulation at baseline within her home and  community. Pt does not drive, and reports that her daughter or "the staff" at  her retirement home/assisted living center are responsible for providing her  with food.  Understanding of Current Condition: Pt has fair understanding of medical and  functional status, noting her impaired vision from her PMH has affected her  ability to participate in hobbies and recreational activities.  Patient/Caregiver Goals:  Patient and family's functional goals for patient: "to  get better"  Home Environment: Patient lives in: retirement community. [address] [place] [address]  [phone]  Primary Language: English    Demographics: (Source of HX, Pre-Hosp, Marital, Income, [place], [place])  Source  of History: Patient.  Pre-Hospital Living  Environment: Assisted Living Residence.  Marital Status: Widowed.  Income Source:   Tree surgeon.    Vocational Status: Retired for Age.  Family Contact Information:  Primary Contact: Baron Hamper  Relationship: Daughter  Address: [address] [place]  Primary Number: [phone]  Secondary Number: None  Family or Representative Notification: Patient does not want family/chosen  representative to be notified of admission to the hospital.  POA/Guardian Information:  Baron Hamper  Phone Number:  [phone]  Relationship to Patient: Daughter: Power of [place].  Care Act Designee: Baron Hamper  Address: [facility]  Phone number: [phone]  Military Status: The patient has never been in the [employer].  Financial Concerns: None    Special Needs: None.   None Listed  Observed Behaviors:  Cooperative.  Psychosocial History:   None.  Adjustment to Present Illness:  Patient is not coping adequately.   Patient is accepting limitations adequately.   Patient's expectations are realistic.   Patient is motivated.  Patient Perceived Primary Stressors:  Providers: Neurology Follow Up  [place]  [address]  [place] [address]  [phone]      Primary Care Follow Up  Follow up with Dr. Loa Socks  [address] [place] [address]  [phone]  Primary Care Physician Notification: Patient does not want primary care  physician to be notified of admission to the hospital.    Home Care/Long Term Care Policy: None.    Interdisciplinary Educational Needs and Learning Preferences:       Learning Preference: The patient's preferred learning method is:  Explanation.       Barriers to Learning: No barriers.       Learning Needs: None.    Education Provided: Plan of care.       Audience: Patient.       Mode: Explanation.       Response: Verbalized understanding.    Rehab Potential: Able to participate in an intensive inpatient interdisciplinary  rehabilitation program, Good family/social support, Good premorbid functional  status, Good premorbid medical status, Living in the community premorbidly,  Motivated  Barriers to Progress/Discharge: No potential barriers to progress.    Case Management Psychosocial Assessment: CM met with the patient at bedside and  spoke with her daughter Yolanda Cox via phone. Prior to admission the patient lived in  an assisted living facility where she is able to get help for many of her daily  tasks. The patient lives at [facility].    The patient has a  walker.    During the conversation discussed the role of CM. CM explained Team conferences  will be held Thursday 04/13/2020 at 11:00 am to discuss current functional  status, barriers to discharge, estimated length of stay, and discharge  recommendations.    CM explained importance of family training and family meeting. Discussed  referrals for Home health vs Outpatient therapy based on team?s recommendations  Family Meeting: Family meeting was offered and will be scheduled. Date and time  pending.  Community Resources: CM will continue to follow for need.    Medicare Important Message: The Medicare Important Message letter was issued.    Care Plan  Identified problems from team documentation:  Problem: Impaired Cognition  Cognition: Primary Team Goal: Pt will answer orientation and biographical  questions with >90% accuracy given min cuing in order to improve memory./    Problem: Impaired Mobility  Mobility: Primary Team Goal: Pt will  correctly demonstrate safe sit<>stand  transfers and ambulation using FWW under direct SUPV or touch assist via  clinical team./Active    Problem: Impaired Self-care Mgmt/ADL/IADL  Self Care: Primary Team Goal: Pt will complete toilet transfers and all  associated tasks with supervision using AD as indicated to ensure safety./    Problem: Safety Risk and Restraint  Safety: Primary Team Goal: Patient will recognized limitations and call for  assistance 100% of the time before getting out of bed./Active    Identified problems from this assessment:     No problems identified at this time.    Please review Integrated Patient View Care Plan Flowsheet for Team identified  Problems, Interventions, and Goals.    Signed by: Dallie Piles, MSW 04/12/2020 2:28:00 PM

## 2020-04-13 NOTE — Rehab Progress Note ([facility]) (Signed)
NAMEABREY BRADWAY  MRN: [id number]  Account: [id number]  Session Start: 04/13/2020 9:20:00 AM  Session Stop: 04/13/2020 10:00:00 AM    Total Treatment Minutes: 40.00 Minutes    Physical Therapy  Inpatient Rehabilitation Progress Note - Brief    Rehab Diagnosis: Closed head injury after a fall, Mild anterior wedge  compression fracture of T7  Demographics:            Age: 78Y            Gender: Female  Rehabilitation Precautions/Restrictions:   Fall precaution, skin precaution, seizure precaution, HOH, impaired vision,  delayed mental processing    SUBJECTIVE  Patient Report: Pt reports no pain today. When asked if she enjoys physical  therapy, pt reports that she does, "very much."  Pain: Patient currently without complaints of pain.    OBJECTIVE  Vital Signs:                       Before Activity              After Activity  Vitals  Time                 9:00                         -  Position/Activity    Sitting upright in WC        -  BP Systolic          122                          -  BP Diastolic         70                           -  Pulse                77 bpm                       -  O2 Saturation        97%                          -    Interventions:       Therapeutic Activities:  Pt demonstrated a car transfer 1x, gait training  on even and uneven surfaces using FWW, dynamic sitting balance, and dynamic  standing balance to increase environmental safety and fall prevention to prepare  pt for return to home setting upon D/C. Pt ambulated >150 ft using FWW through  hallway filled with static and mobile obstacles to increase problem solving and  safety awareness while functionally navigating an environment similar to her  retirement community. Pt completed >10 feet of gait training over uneven  surfaces w/ FWW to simulate an outdoor environment to increase implementation of  balance strategies and problem solving for decreased caregiver burden. Mass  practice of dynamic sitting balance performed using 6  cones placed /R/2-3 feet  anteriorly, bilaterally, and posterior laterally at varying levels to facilitate  active trunk rotation, anterior and bilateral weight-shifts, and reaching across  midline to increase problem solving and functional balance. Pt reached to stack  cones 2x10 times at floor level, 2x10 at chest level (on 2 rolling trays), and  3x10 at mat level.  In standing w/ BUE placed on FWW, completed 2x10 of kicking  6 cones placed /R/2 feet anteriorly and bilaterally at floor level to increase  time spent in SLS to increase SLS during swing phase to encourage proper gait  mechanics during ambulation.  Patient was left seated in chair in his/her room.  Patient was handed off to next therapist. Handoff to nurse completed.  Chair alarm in place and activated.  Oriented to call bell and placed within reach.  Personal items within reach.  Assistive devices positioned out of reach.  Pain Reassessment: Pain was not reassessed as no pain was reported.    Education Provided:    Education Provided: Plan of care. Rehab techniques and procedures. Pt oriented  to plan for today's PT session, using short, concise statements to facilitate  memory retention. Safety issues and interventions. Supervision requirements. Use  of adaptive devices. Functional transfers. Fall prevention/balance training. Car  transfers. Safety. Gait. Stair/curb/environmental barrier negotiation.       Audience: Patient.       Mode: Explanation.  Demonstration.       Response: Applied knowledge.  Verbalized understanding.  Demonstrated skill.  Needs practice.  Needs reinforcement.    ASSESSMENT  Pt requires SUPV for steadying support during stand pivot car transfer. However,  pt demonstrates approppriate sequencing, requiring min verbal cuing for L  lateral scooting to center herself once seated to allow for door clearance.  During ambulation w/ FWW and touch assist, pt follows simple directions for  turning L and R when navigating >150 feet  through hallway. Pt presents w/  decreased cadence, decreased step length, narrow BOS, and kyphotic posture while  ambulating, requiring moderate verbal cuing to bring COM closer to FWW to  increase proper gait mechanics and safety. See care tools for ambulation over  uneven ground.  During dynamic sitting intervention, pt initially presents w/ decreased trunk  control during anterior weight shift, however, using SUPV and moderate  multimodal cuing, pt utilizes anterior weight shifting and trunk extension  to  reach w/ R dominant hand for stacking cones. Pt requires SUPV and minimal verbal  cuing to achieve trunk rotation in bilateral and posterior lateral directions to  stack cones. With SUPV, pt followed simple directions to reach w/ contralateral  hand and cross midline to stack cones, w no LOB noted. During dynamic standing  task, pt Mod Assist using FWW for BUE support and presents w/ decreased single  leg stance time and compensatory lateral sway to kick cones 2/2 to decreased  gluteal strength and balance strategies. Recommending dynamic balance training  in sitting and standing, strengthening of BLE, and gait training to facilitate  safe transfers and ambulation for reduced caregiver burden and return to PLOF.    CARE Tool  Mobility Functional Assessment  Car Transfer: 04 - Touching Assistance: Helper provides  touching/steadying/contact guard   Pt requires touch assist for steadying as she transfers from [facility] to car w/  increased time and effort 2/2 to decreased safety awareness. Pt uses appropriate  sequencing throughout the task, requiring verbal cuing for scooting to the L for  improved sitting mechanics while seated in car.    Walk 150 Feet:  04 - Touching Assistance: Helper provides  touching/steadying/contact guard   Pt performs dual task of following simple directions to navigate hallway while  ambulating using FWW and touch assist. Pt presents w/ decreased step length,  decreased cadence, and narrow  BOS. Pt requires moderate verbal cuing to maintain  upright positioning and to bring FWW close to COM to maintain balance during  gait.    Walking 10 Feet on Uneven Surface: 03 - Partial/Moderate Assistance: Helper does  1-25% of the activity   Pt ambulated over uneven ground /R/10 feet and two 1" thresholds using FWW and  Min Assist. Pt required moderate verbal cuing to increase upright posturing and  bring COM closer to FWW to facilitate efficient gait mechanics. However, pt  demonstrated independent problem solving by reducing gait speed and using front  of walker to assess ground level before stepping through to prevent LOB.      PLAN  Continued Physical Therapy is recommended.  Recommended Frequency/Duration/Intensity: 60-120 minutes, 5-7 days a week for  10-14 days from IE on 04/12/2020.  D/C set for 04/25/2020.  Activities Contributing Toward Care Plan: TherActivities, TherEx, Transfer  training, gait training, static and dynamic balance training in sitting and  standing, cardiovascular endurance training, B LE strength training. Pt will  benefit from skilled care to improve safety, decrease fall risk, and reduce  caregiver burden, [place] planning as part of the interdisciplinary team.    3 Hour Rule Minutes: 40 minutes of PT treatment this session count towards  intensity and duration of therapy requirement. Patient was not seen for the full  scheduled time of PT treatment this session.   Pt required increased time to complete breakfast tray.  Will attempt to see patient later today, as scheduled.  Therapy Mode Minutes: Individual: 40 minutes.    Assessment and treatment was provided under the 1:1 direct supervision of Juluis Pitch, PT, DPT, NCS    Signed by: Everlene Other, SPT 04/13/2020 10:00:00 AM    Direct 1:1 supervision of Everlene Other, student of Physical Therapy, was  provided throughout this session.   - CoSigned By: Juluis Pitch, PT 04/13/2020 8:16:17 PM

## 2020-04-13 NOTE — Progress Notes (Signed)
[name]  MRN: [id number]  Account: [id number]  Session Start: 04/13/2020 12:00:00 AM  Session Stop: 04/13/2020 12:00:00 AM    Total Treatment Minutes:  Minutes    Physical Medicine and Rehabilitation  Initial Individualized Interdisciplinary Plan Of Care    Rehab Diagnosis: Closed head injury after a fall, Mild anterior wedge  compression fracture of T7  Demographics:            Age: 42Y            Gender: Female    Prognosis: good    Plan Of Care  Anticipated Discharge Date/Estimated Length of Stay: 10-14 days  Anticipated Discharge Destination: Community discharge with assistance  Discharge Plan : home with home health  Fall Risk Level: Yellow (Medium)  Medical Necessity Expected Level Rationale: medical management, vital signs, lab  monitoring  Intensity and Duration: an average of 3 hours/5 days per week  Medical Supervision and 24 Hour Rehab Nursing: x  Physical Therapy: x  PT Intensity/Duration: 60-120 min daily, 5-6 times per week  PT Number of days: 10  Occupational Therapy: x  OT Intensity/Duration: 60-120 min daily, 5-6 times per week  OT Number of days: 10  Speech and Language Therapy: x  SLP Intensity/Duration: 60-120 min daily, 5-6 times per week  SLP Number of days: 10  Therapeutic Recreation: x  Psychology: x  Registered Dietician: x  Case Management: Oretha Milch, CM  Nurse: Bertram Denver, RN  Occupational Therapy: Lorie Apley, OT  Physical Therapy: Juluis Pitch, PT  Physician: Gillis Santa, MD  Speech Language Pathology: Mikael Spray, SLP    The following is a list of patient problems that have been identified by the  interdisciplinary team:    Problem: Impaired Cognition  Cognition Status Update: Pt can answer orientation and biographical questions  with 70% accuracy given mod cues.  Team Identified Barrier to Discharge: No  Interventions:  Decrease environmental stimuli: Active  Safety awareness training: Active  Compensatory strategies: Active  Cognition: Primary Team  Goal/Status: Pt will answer orientation and biographical  questions with >90% accuracy given min cuing in order to improve memory. /    Problem: Impaired Leisure Skills  Leisure Skills: Primary Team Goal/Status: Pt and caregiver will describe  opportunities and resources for social engagement in the community. /    Problem: Impaired Mobility  Mobility Status Update: Min A for transfers, Min A for ambulation using FWW, bed  mobility SUPV w/ bed rail  Interventions:  Transfer training: Active  Gait training: Active  Wheelchair propulsion and management: Active  Mobility: Primary Team Goal/Status: Pt will correctly demonstrate safe  sit<>stand transfers and ambulation using FWW under direct SUPV or touch assist  via clinical team. / Active    Problem: Impaired Self-care Mgmt/ADL/IADL  Team Identified Barrier to Discharge: Yes  Interventions:  Adaptive equipment training: Active  Compensatory strategies: Active  Encourage patient to participate in activities of daily living: Active  Self Care: Primary Team Goal/Status: Pt will complete toilet transfers and all  associated tasks with supervision using AD as indicated to ensure safety. /    Problem: Safety Risk and Restraint  Safety Risk Status Update: Pt calls for assistance. Pt is yellow on the fall  risk scale.  Team Identified Barrier to Discharge: Yes  Interventions:  Identify patient at risk for falls: Active  Call light within reach: Active  Orient patient/family/caregiver to room and equipment: Active  Stay with patient in bathroom: Active  Bed alarm/wheelchair alarm: Active  Anticipate needs, including personal items within reach: Active  Safety: Primary Team Goal/Status: Patient will recognized limitations and call  for assistance 100% of the time before getting out of bed. / Active    Comments: 04/13/2020    Signed by: Gillis Santa, MD 04/13/2020 5:11:00 PM    Physician CoSigned By: Gillis Santa 04/13/2020 17:10:49

## 2020-04-13 NOTE — Rehab Progress Note ([facility]) (Signed)
[name]  MRN: [id number]  Account: [id number]  Session Start: 04/13/2020 4:30:00 PM  Session Stop: 04/13/2020 4:30:00 PM    Total Treatment Minutes:  Minutes    Physical Therapy  Inpatient Rehabilitation Exception Note    Patient was unable to complete planned therapy session.  0 minutes of PT treatment this session count towards intensity and duration of  therapy requirement. Patient was not seen for the full scheduled time of PT  treatment this session.   PT attempted to make up missed minutes this PM. Pt fatigue and sleep in bed.  Family requesting to let patient sleep and attempt tomorrow.  Will attempt to see patient tomorrow,  as scheduled    Signed by: Juluis Pitch, PT 04/13/2020 4:30:00 PM

## 2020-04-14 LAB — GLUCOSE WHOLE BLOOD - POCT
Whole Blood Glucose POCT: 152 mg/dL — ABNORMAL HIGH (ref 70–100)
Whole Blood Glucose POCT: 258 mg/dL — ABNORMAL HIGH (ref 70–100)
Whole Blood Glucose POCT: 216 mg/dL — ABNORMAL HIGH (ref 70–100)
Whole Blood Glucose POCT: 210 mg/dL — ABNORMAL HIGH (ref 70–100)
Whole Blood Glucose POCT: 235 mg/dL — ABNORMAL HIGH (ref 70–100)

## 2020-04-14 NOTE — Rehab Progress Note ([facility]) (Signed)
Yolanda Cox  MRN: [id number]  Account: [id number]  Session Start: 04/14/2020 1:00:00 PM  Session Stop: 04/14/2020 2:00:00 PM    Total Treatment Minutes: 60.00 Minutes    Physical Therapy  Inpatient Rehabilitation Progress Note - Brief    Rehab Diagnosis: Closed head injury after a fall, Mild anterior wedge  compression fracture of T7  Demographics:            Age: 56Y            Gender: Female  Rehabilitation Precautions/Restrictions:   Fall precaution, skin precaution, seizure precaution, HOH, impaired vision,  delayed mental processing    SUBJECTIVE  Patient Report: Pt received seated in w/c, chair alarm engaged, needs within  reach. Pt agreeable to PT tx. Pt c/o HA to area of head injured during fall. Pt  declines pharmacological interventions  Pain: Patient currently has pain.  Patient reports a pain level of 6 out of 10. Repositioned patient.    OBJECTIVE  Vital Signs:                       Before Activity              After Activity  Vitals  Position/Activity    sitting                      -  BP Systolic          123                          -  BP Diastolic         71                           -  Pulse                66                           -    Interventions:       Therapeutic Activities:  PT session with focus on mass practice sit<>stand  with emphasis on hand placement for DME safety, and gait training to increase  step height and gait speed with visual and audible cues to normalize gait  pattern. Cardiovascular warm-up prior to NMRE for transfer and gait training. Pt  completed 1 x 5 reps sit<>stand and 3 bouts of continuous marching in standing x  30 seconds with 30 second standing rest breaks at CGA level. Transfer Training:  Mass practice via blocked and then random rehearsal for sit<>stand and  stand-step turns using FWW with Mod verbal cues for hand placement to prevent  FWW from tipping Total 17 transfers completed using varied surfaces (w/c, tall  back arm chair, standard arm chair,  bariatric arm chair).  Gait training: 2 x 15  feet using FWW to walk over 2-3 inch objects on the floor to encourage increase  hip/knee flexion nd step length during swing phase of gait to correct self  selected shuffling gait pattern. Pt able to lead with L and R LE over obstacles  with CGA-Min A. Seated in w/c, pt completed 30 seconds of repeated marching with  60 bpm and 70 bpm in preparation for gait training with metronome. 3 x 100 feet  using FWW at CGA (no audible metronome, 60 bpm metronome,  70 bpm metronome) to  continue to facilitate increased step height and gait speed.      Patient was left seated in chair with family/friends present. Handoff to nurse  completed.  Chair alarm in place and activated.  Oriented to call bell and placed within reach.  Personal items within reach.  Assistive devices positioned out of reach.  Bed placed in lowest position.  Pain Reassessment: Pt with HA pain throughout session. Pt declines medication  management. Low stim environment utilized for symptom management. HA symptoms to  do appear to impact pt arousal or participation in therapy session    Education Provided:    Education Provided: Functional transfers. Safety. Gait.       Audience: Patient.       Mode: Explanation.  Demonstration.  Teacher, English as a foreign language provided.   Daughter Yolanda Cox) present at end of PT session. PT completed memory log book to  review key treatment interventions completed during session       Response: Verbalized understanding.  Needs practice.  Needs reinforcement.    ASSESSMENT  Pt responded well to audible metronome to increase step height and gait speed to  normalize gait pattern due to bradykinesia s/p TBI. Pt continues to benefit from  tactile cues to recall appropriate hand placement for sit<>stand transfers to  maintain safety with DME and prevent FWW from tipping. Pt presents with L visual  inattention and absent self selected counter-clockwise turns throughout session.  Pt will benefit from  continued skilled acute PT to address L visual inattention,  standing balance training, and gait pattern normalization interventions to  maximize functional recovery and decrease risk of falls.    PLAN  Continued Physical Therapy is recommended.  Recommended Frequency/Duration/Intensity: 60-120 minutes, 5-7 days a week for  10-14 days from IE on 04/12/2020.  D/C set for 04/25/2020.  Activities Contributing Toward Care Plan: TherActivities, TherEx, Transfer  training, gait training, static and dynamic balance training in sitting and  standing, cardiovascular endurance training, B LE strength training. Pt will  benefit from skilled care to improve safety, decrease fall risk, and reduce  caregiver burden, [place] planning as part of the interdisciplinary team.    3 Hour Rule Minutes: 60 minutes of PT treatment this session count towards  intensity and duration of therapy requirement. Patient was seen for the full  scheduled time of PT treatment this session.  Therapy Mode Minutes: Individual: 60 minutes.    Signed by: Juluis Pitch, PT 04/14/2020 2:00:00 PM

## 2020-04-14 NOTE — Rehab Progress Note ([facility]) (Signed)
Yolanda Cox  MRN: [id number]  Account: [id number]  Session Start: 04/14/2020 12:00:00 AM  Session Stop: 04/14/2020 12:00:00 AM    Total Treatment Minutes:  Minutes    Rehabilitation Nursing  Inpatient Rehabilitation Shift Assessment    Rehab Diagnosis: Closed head injury after a fall, Mild anterior wedge  compression fracture of T7  Demographics:            Age: 58Y            Gender: Female  Primary Language: English    Date of Onset:  04/09/2020  Date of Admission: 04/11/2020 5:40:00 PM    Rehabilitation Precautions Restrictions:   Fall precaution, skin precaution, seizure precaution, HOH, impaired vision,  delayed mental processing    Patient Report: " OK"  Patient/Caregiver Goals:  "to get better"    Wounds/Incisions: See Epic    Medication Review: No clinically significant medication issues identified this  shift.    Bowel and Bladder Output:                       Bladder (# only)             Bowel (# only)  Number of Episodes  Continent            3                            0  Incontinent          0                            0    Additional Education Provided:    Education Provided: Precautions. Pain management. Medication options. Pain  scale. Clinical indicators of pain. Safety issues and interventions. Fall  protocol. Supervision requirements. Activities of daily living. Fall  prevention/balance training. Safety. Gait. Medication. Administration. Name and  dosage. Side Effects. Purpose. Labs. Anticoagulants. Cardiac. Nutrition and  lifestyle changes. Monitoring blood pressure. Diabetes. Medication. Glucose  testing. Insulin drawing/mixing/administration. Skin care. Effects on wound  healing. Diabetic foot care. Diet management. Hypoglycemia  causes/symptoms/treatment. Hyperglycemia causes/symptoms/treatment. Weight  management.       Audience: Patient.       Mode: Explanation.  Demonstration.  Teacher, English as a foreign language provided.       Response: Applied knowledge.  Verbalized understanding.  Needs  reinforcement.    TEAM CARE PLAN  Identified problems from team documentation:  Problem: Impaired Cognition  Cognition: Primary Team Goal: Pt will answer orientation and biographical  questions with >90% accuracy given min cuing in order to improve memory./    Problem: Impaired Leisure Skills  Leisure Skills: Primary Team Goal: Pt and caregiver will describe opportunities  and resources for social engagement in the community./    Problem: Impaired Mobility  Mobility: Primary Team Goal: Pt will correctly demonstrate safe sit<>stand  transfers and ambulation using FWW under direct SUPV or touch assist via  clinical team./Active    Problem: Impaired Self-care Mgmt/ADL/IADL  Self Care: Primary Team Goal: Pt will complete toilet transfers and all  associated tasks with supervision using AD as indicated to ensure safety./    Problem: Safety Risk and Restraint  Safety: Primary Team Goal: Patient will recognized limitations and call for  assistance 100% of the time before getting out of bed./Active    Please review Integrated Patient View Care Plan Flowsheet for Team identified  Problems,  Interventions, and Goals.    Signed by: Brandon Melnick, RN 04/14/2020 12:01:00 AM

## 2020-04-14 NOTE — Progress Notes (Signed)
MEDICINE PROGRESS NOTE  Yolanda Cox R. Geoffery Spruce MD    Date Time: 04/14/20 2:41 PM  Patient Name: Yolanda Cox  Attending Physician: Arizona Constable, MD, MD    CC: Medical management    Interval History/24 hour events: Tolerating PT slept better    Patient is an 87 y.o. right hand-dominant female with prior traumatic brain bleed, NPH, HTN, DM, HLD, hypothyroidism, RA, and osteoporosis who presented to the ED from [facility] assisted living after having a syncopal episode and hitting her head. Reportedly patient was altered while in the ambulance and had seizure activity while in the ED. CT head was done and showed no evidence of acute intracranial injury. EEG was done and was reported as normal. MRI brain showed moderate generalized intraparenchymal volume loss with moderately advanced chronic microvascular changes but no acute changes. Neurology was consulted and patient was started on keppra. She was found to have pneumonia, which was thought likely to be secondary to aspiration from seizure activity. She was started initially on IV abx and then transitioned to oral by the time of discharge. Per documentation the remainder of her hospitalization was unremarkable. She was evaluated by PT/OT/SLP and deemed appropriate for acute inpatient rehab and was transferred to the [facility] inpatient rehab unit on 04/11/2020.   Review of Systems:   Review of Systems - Negative except As in HPI      Physical Exam:     Patient Vitals for the past 24 hrs:   BP Temp Temp src Pulse Resp SpO2   04/14/20 1101 132/68 -- -- 65 -- --   04/14/20 0819 147/75 97.7 F (36.5 C) Oral 63 16 94 %   04/14/20 0625 136/72 98.1 F (36.7 C) Oral 64 16 95 %   04/13/20 1546 136/74 97.3 F (36.3 C) Oral 63 16 96 %       Intake and Output Summary (Last 24 hours) at Date Time    Intake/Output Summary (Last 24 hours) at 04/14/2020 1441  Last data filed at 04/14/2020 0825  Gross per 24 hour   Intake 620 ml   Output 300 ml   Net 320 ml       General:  awake, alert, oriented x 1; no acute distress.  Cardiovascular: regular rate and rhythm, no murmurs, rubs or gallops  Lungs: clear to auscultation bilaterally, without wheezing, rhonchi, or rales  Abdomen: soft, non-tender, non-distended; no palpable masses, no hepatosplenomegaly, normoactive bowel sounds, no rebound or guarding  Extremities: no clubbing, cyanosis, or edema  Other: left side of scalp some tenderness    Meds:     Medications were reviewed:    Labs:     Results     Procedure Component Value Units Date/Time    Glucose Whole Blood - POCT [[id number]]  (Abnormal) Collected: 04/14/20 1204     Updated: 04/14/20 1207     Whole Blood Glucose POCT 258 mg/dL     Glucose Whole Blood - POCT [[id number]]  (Abnormal) Collected: 04/14/20 0808     Updated: 04/14/20 0810     Whole Blood Glucose POCT 152 mg/dL     Glucose Whole Blood - POCT [[id number]]  (Abnormal) Collected: 04/13/20 2123     Updated: 04/13/20 2127     Whole Blood Glucose POCT 266 mg/dL     Glucose Whole Blood - POCT [[id number]]  (Abnormal) Collected: 04/13/20 1629     Updated: 04/13/20 1633     Whole Blood Glucose POCT 249 mg/dL     Urinalysis [  [id number]]  (Abnormal) Collected: 04/13/20 1438     Updated: 04/13/20 1502     Urine Type Random     Color, UA Yellow     Clarity, UA Clear     Specific Gravity UA 1.025     Urine pH 6.5     Leukocyte Esterase, UA Negative     Nitrite, UA Negative     Protein, UR Negative     Glucose, UA 500     Ketones UA Negative     Urobilinogen, UA 1.0 mg/dL      Bilirubin, UA Negative     Blood, UA Negative            Imaging personally reviewed, including:       Assessment:     Patient Active Problem List   Diagnosis    Fall, initial encounter    Seizure after head injury    Debility           Plan:   -Continue Keppra 500 twice daily  -Finish course of Augmentin for aspiration pneumonia  -Continue amlodipine for hypertension  -Blood sugar running high hemoglobin A1c was 7.6, (per daughter blood sugar goes and she is  under stress doesn't want any oral hypoglycemic at present we'll monitor  -Was on methotrexate for rheumatoid arthritis as outpatient may resume soon  -On aspirin 325 for DVT prophylaxis        Disposition:   Today's date: 04/14/2020        Signed by: Arizona Constable, MD, MD

## 2020-04-14 NOTE — Rehab Progress Note (Medilinks) (Signed)
NAMEMCKYLA Cox  MRN: [id number]  Account: [id number]  Session Start: 04/14/2020 11:00:00 AM  Session Stop: 04/14/2020 12:00:00 PM    Total Treatment Minutes: 60.00 Minutes    Occupational Therapy  Inpatient Rehabilitation Progress Note - Brief    Rehab Diagnosis: Closed head injury after a fall, Mild anterior wedge  compression fracture of T7  Demographics:            Age: 49Y            Gender: Female  Rehabilitation Precautions/Restrictions:   Fall precaution, skin precaution, seizure precaution, HOH, impaired vision,  delayed mental processing    SUBJECTIVE  Patient Report: "That wasn't so bad" in regards to mobility and ADL tasks.  Pain: Patient currently without complaints of pain.    OBJECTIVE  Vital Signs:                       Before Activity              After Activity  Vitals  Time                 1105                         1155  Position/Activity    supine                       seated  BP Systolic          132                          120  BP Diastolic         68                           70  Pulse                65                           66    Interventions:       Self Care/Home Management:  Pt seen in OT gym with focus on balance and  navigating tight spaces with RW during snack retrieval task, simulating home  environment. Pt performed with close SBA and 2 VCs on positioning RW and hand  placement for safety. Pt ambulated > 181ft to bathroom with SBA and performed  toileting routine with RW and grab bars. Pt required 1 VC for safety i.e.  getting closer to toilet prior to removing clothing. Pt participated in item  retrieval at floor level using RW and reacher, which pt reports she owns at  home. (+) recall of AE  purpose. Pt denies diplopia today, is able to track in  all quadrants. Initially states she is not light sensitive but later reports  opposite. Sunglasses used t/o session today.  Patient was left seated in chair in his/her room. Handoff to nurse completed.  Chair alarm in place and  activated.  Oriented to call bell and placed within reach.  Personal items within reach.  Assistive devices positioned out of reach.  Pain Reassessment: Pain was not reassessed as no pain was reported.    Education Provided:    Education Provided: Activities of daily living. Functional transfers. Safety.       Audience:  Patient.       Mode: Explanation.  Demonstration.       Response: Verbalized understanding.  Demonstrated skill.  Needs practice.    ASSESSMENT  Pt benefits from rephrasing and repeating questions 2/2 HOH and delayed auditory  processing. She is demonstrating progress with mobility and balance, as  evidenced by SBA today rather than steadying assist. Pt demonstrates no LOB, but  does require minimal cues to recall hand placement and safety strategies in  novel environment. Pt continues to have difficulty seeing at distance greater  than 12 " from face, and would benefit from low vision modifications, i.e. large  print, room modifications, etc. She will benefit from ongoing OT in order to  improve safety with ADLs and transfers, and educate on low vision adaptations in  order to safely return home to independent living.    CARE Tool  Self-Care Functional Assessment      Toileting Hygiene: 04 - Supervision: Helper provides supervision for safety or  verbal cues ONLY  Toileting Equipment:   Toilet  Assistive Device(s):   Adaptive device to maintain balance, Grab bar    Putting On/Taking Off Footwear: 05 - Setup or Clean Up Assistance: Helper sets  up or cleans up prior to or following an activity  Assistive Device(s):   None   slip on sneakers/no socks    Mobility Functional Assessment    Sit to Stand: 03 - Partial/Moderate Assistance: Helper does 1-25% of the  activity  Assistive Device(s):   Rolling walker    Chair/Bed-to-Chair Transfer: 04 - Supervision: Helper provides supervision for  safety or verbal cues ONLY  Assistive Device(s):   Bed rails, Rolling walker    Toilet Transfer: 04 - Supervision:  Helper provides supervision for safety or  verbal cues ONLY  Assistive Device(s):   Grab bars, Rolling walker    Picking Up Objects: 04 - Supervision: Helper provides supervision for safety or  verbal cues ONLY  Assistive Device(s:)   Assistive device to maintain balance, Reacher    PLAN  Continued Occupational Therapy is recommended.  Recommended Frequency/Duration/Intensity: 60-120 min/day, 5-6 days/week, for  approx 10-14 days from evaluation on 04/13/2019; 1:1 therapy or group treatment  as appropriate.  Continued Activities Contributing Toward Care Plan: ADL/IADL retraining, ther  ex, ther act, modalities, DME recommendations, d/c planning, equipment, family  education/training, functional mobility/transfer training, energy conservation,  balance training, fall prevention training, functional activity tolerance  training, NMRE, cognitive retraining, ARMEO, RTI, Bioness    3 Hour Rule Minutes: 60 minutes of OT treatment this session count towards  intensity and duration of therapy requirement. Patient was seen for the full  scheduled time of OT treatment this session.  Therapy Mode Minutes: Individual: 60 minutes.    Signed by: Melinda Crutch, OTR/L 04/14/2020 12:00:00 PM

## 2020-04-14 NOTE — Rehab Progress Note (Medilinks) (Signed)
Yolanda Cox  MRN: [id number]  Account: [id number]  Session Start: 04/14/2020 9:00:00 AM  Session Stop: 04/14/2020 10:00:00 AM    Total Treatment Minutes: 60.00 Minutes    Speech Language Pathology  Inpatient Rehabilitation Progress Note - Brief    Rehab Diagnosis: Closed head injury after a fall, Mild anterior wedge  compression fracture of T7  Demographics:            Age: 76Y            Gender: Female  Rehabilitation Precautions/Restrictions:   Fall precaution, skin precaution, seizure precaution, HOH, impaired vision,  delayed mental processing    SUBJECTIVE  Patient Report: "I hit my head"  Pain: Patient currently without complaints of pain.    OBJECTIVE    Interventions:       Speech Treatment: Pt participated in filling out memory book. Demonstrated  ability to label dates/month/and year on calendar with mod cues provided. able  to identify that it was Friday but unsure of date.  Labeled her own birthday and  a friends birthday as well. Pt able to recall falling on her way to lunch and  hitting her head when filling out summary portion of memory book. Pt was unaware  that she had a seizure while in the ER. Able to recall some of summary written  after reading it through. Pt stating that she really is grateful for the memory  book and plans to use it to help with recall.  Patient was returned to or left in bed. Handoff to nurse completed.  Oriented to call bell and placed within reach.  Personal items within reach.  Assistive devices positioned out of reach.  Bed placed in lowest position.  Pain Reassessment: Pain was not reassessed as no pain was reported.    Education Provided:    Education Provided: Plan of care. Cognitive functioning. Compensatory cognitive  strategies/aids       Audience: Patient.       Mode: Explanation.  Demonstration.       Response: Verbalized understanding.  Needs practice.  Needs reinforcement.    ASSESSMENT  Pt demonstrating ability to utilize memory book to increase insight  and recall  of personal information when provided with min/mod verbal cues by SLP. Pt  expressing interest and motivation in using memory book. Would continue to  benefit from SLP intervention to increase insight and ability to participate in  ADL's safely.    PLAN  Continued Speech Language Pathology is recommended to address:  Recommended Frequency/Duration/Intensity: 60-120 minutes 1-2x day, 5-6x week,  for 10-14 days  Continued Activities Contributing Toward Care Plan: dynamic assessment,  neuro-cognitive retraining, memory strategies, finance/medication management  exercises, external aids, brain injury education, family training, and d/c  planning    3 Hour Rule Minutes: 60 minutes of SLP treatment this session count towards  intensity and duration of therapy requirement. Patient was seen for the full  scheduled time of SLP treatment this session.  Therapy Mode Minutes: Individual: 60 minutes.    Signed by: Clydene Laming, SLP 04/14/2020 10:00:00 AM

## 2020-04-14 NOTE — Progress Notes (Signed)
PHYSICAL MEDICINE AND REHABILITATION  PROGRESS NOTE -- FACE-TO-FACE ENCOUNTER    Date Time: 04/14/20 2:44 PM  Patient Name: Yolanda Cox, Yolanda Cox    Admission date:  04/11/2020    Subjective:     No acute events overnight. Patient feels well today without current complaints. Had a good day in therapy. Pleased her daughter is here visiting and was able to help do her hair.     Functional Status:     PT:  Summary of Deficits and Prognosis: Pt is an 87 y/o female S/P a mechanical fall  resulting in a TBI. Pt's PLOF is MOD I using a FWW. During PT eval, pt required  SUPV for functional bed mobility including  rolling L<>R, and sit<>supine. Pt  requires Min A for sit<> stand transfers from [facility] and bed w/ increased time and  effort 2/2 to cognitive deficits. Pt performed sit<>stand from bed and no AD w/  Min assist to maximize safety. Pt able to perform independent sitting balance  for >20 seconds, demonstrating appropriate trunk control for static ADLs. During  ambulation w/ FWW, pt presents w/ step through pattern w/ decreased step-length  and cadence. Pt showed no LOB during gait assesment using FWW. Pt demonstrates  self selected gait speed of 0.21 m/s on the , indicative of limited  household ambulation capacity. Recommending skilled PT care with a focus on  transfers, balance, and gait training to improve functional mobility, increase  independence, and return to PLOF.  Rehab Potential: Good family/social support, Good premorbid functional status,  Living in the community premorbidly Pt living in assisted living or retirement  center prior to rehab admission, which provides assistance w/ household tasks  such as cooking for the residents.  Barriers to Progress/Discharge: Social/cultural barriers, Poor activity  tolerance, Functional status, Medical condition    OT:  Pt benefits from rephrasing and repeating questions 2/2 HOH and delayed auditory  processing. She is demonstrating progress with mobility and  balance, as  evidenced by SBA today rather than steadying assist. Pt demonstrates no LOB, but  does require minimal cues to recall hand placement and safety strategies in  novel environment. Pt continues to have difficulty seeing at distance greater  than 12 " from face, and would benefit from low vision modifications, i.e. large  print, room modifications, etc. She will benefit from ongoing OT in order to  improve safety with ADLs and transfers, and educate on low vision adaptations in  order to safely return home to independent living.    SLP:  Significant improvements in memory and recall of personal details. Limited  understanding of internal memory strategies; therefore would only benefit from  external aids at this time. Recommend introduction of memory book in next  session.    Medications:   Medication reviewed by me:     Scheduled Meds: PRN Meds:   amLODIPine, 10 mg, Oral, Daily  amoxicillin-clavulanate, 1 tablet, Oral, Q12H [facility]  aspirin, 325 mg, Oral, Daily  atorvastatin, 40 mg, Oral, QHS  calcium citrate-vitamin D, 2 tablet, Oral, Daily  folic acid, 1 mg, Oral, Daily  insulin lispro, 1-3 Units, Subcutaneous, QHS  insulin lispro, 1-5 Units, Subcutaneous, TID AC  lactobacillus/streptococcus, 1 capsule, Oral, Daily  levETIRAcetam, 500 mg, Oral, Q12H [facility]  levothyroxine, 75 mcg, Oral, Daily at 0600  multivitamin, 1 tablet, Oral, Daily  sertraline, 50 mg, Oral, Daily  vitamin D, 25 mcg, Oral, Daily        Continuous Infusions:   acetaminophen, 650 mg, Q6H PRN  albuterol-ipratropium, 3 mL, Q6H PRN  butalbital-acetaminophen-caffeine, 1 tablet, Q4H PRN  dextrose, 15 g of glucose, PRN   And  dextrose, 12.5 g, PRN   And  glucagon (rDNA), 1 mg, PRN  magnesium hydroxide, 30 mL, Daily PRN  melatonin, 3 mg, QHS PRN  ondansetron, 4 mg, Q8H PRN  senna-docusate, 2 tablet, QHS PRN          Medication Review  1. A complete drug regimen review was completed: Yes  2. Were any drug issues found during review?: No      Review of  Systems:   A comprehensive review of systems was: No fevers, chills, nausea, vomiting, chest pain, shortness of breath, cough, headache, double vision.  All others negative.    Physical Exam:     Vitals:    04/13/20 1546 04/14/20 0625 04/14/20 0819 04/14/20 1101   BP: 136/74 136/72 147/75 132/68   Pulse: 63 64 63 65   Resp: 16 16 16     Temp: 97.3 F (36.3 C) 98.1 F (36.7 C) 97.7 F (36.5 C)    TempSrc: Oral Oral Oral    SpO2: 96% 95% 94%    Weight:       Height:           Intake and Output Summary (Last 24 hours) at Date Time    Intake/Output Summary (Last 24 hours) at 04/14/2020 1444  Last data filed at 04/14/2020 0825  Gross per 24 hour   Intake 620 ml   Output 300 ml   Net 320 ml     P.O.: 200 mL (04/14/20 0825)     Urine: 0 mL (04/14/20 0825)           General: WN/WD, in NAD, seen lying in bed  HEENT: NC/AT, moist mucosal membranes, anicteric sclera  Cardiac: regular rate, extremities warm and well perfused  Resp: non-labored breathing, acyanotic  Abdomen: soft, non-tender, non-distended  Neuro: Awake and alert, oriented to person and place, knows the month and year but not day, speech fluent and spontaneous without dysarthria but is slowed, mild proximal weakness but moves extremities with AG strength throughout    Labs:   No results for input(s): GLUCOSEWHOLE in the last 24 hours.    Recent Labs   Lab 04/12/20  0526 04/11/20  0455 04/10/20  0519 04/08/20  1639   WBC 8.42 11.34* 18.92* 20.05*   Hgb 10.5* 10.4* 10.2* 11.0*   Hematocrit 32.1* 31.5* 31.4* 34.1*   Platelets 177 164 149 185        Recent Labs   Lab 04/12/20  0526 04/11/20  0455 04/10/20  0519 04/09/20  0914   Sodium 140 141 138 138   Potassium 3.3* 3.8 3.8 4.0   Chloride 106 106 105 105   CO2 26 24 22 24    BUN 11.0 15.4 13.0 15.3   Creatinine 0.7 0.8 0.8 0.8   Calcium 9.4 8.7 8.8 8.9   Albumin  --   --   --  3.9   Protein, Total  --   --   --  7.0   Bilirubin, Total  --   --   --  1.6*   Alkaline Phosphatase  --   --   --  57   ALT  --   --   --   11   AST (SGOT)  --   --   --  20   Glucose 164* 226* 164* 208*   Magnesium  --   --   --  2.1   Phosphorus  --   --   --  3.2       Recent Labs   Lab 04/08/20  1639   PT INR 1.1       Results     Procedure Component Value Units Date/Time    Glucose Whole Blood - POCT [[id number]]  (Abnormal) Collected: 04/14/20 1204     Updated: 04/14/20 1207     Whole Blood Glucose POCT 258 mg/dL     Glucose Whole Blood - POCT [[id number]]  (Abnormal) Collected: 04/14/20 0808     Updated: 04/14/20 0810     Whole Blood Glucose POCT 152 mg/dL     Glucose Whole Blood - POCT [[id number]]  (Abnormal) Collected: 04/13/20 2123     Updated: 04/13/20 2127     Whole Blood Glucose POCT 266 mg/dL     Glucose Whole Blood - POCT [[id number]]  (Abnormal) Collected: 04/13/20 1629     Updated: 04/13/20 1633     Whole Blood Glucose POCT 249 mg/dL     Urinalysis [[id number]]  (Abnormal) Collected: 04/13/20 1438     Updated: 04/13/20 1502     Urine Type Random     Color, UA Yellow     Clarity, UA Clear     Specific Gravity UA 1.025     Urine pH 6.5     Leukocyte Esterase, UA Negative     Nitrite, UA Negative     Protein, UR Negative     Glucose, UA 500     Ketones UA Negative     Urobilinogen, UA 1.0 mg/dL      Bilirubin, UA Negative     Blood, UA Negative               Rads:   Radiological Procedure reviewed.  Radiology Results (24 Hour)     ** No results found for the last 24 hours. **              Assessment and Plan:     87yo F with prior traumatic brain bleed, NPH, HTN, DM, HLD, hypothyroidism, RA, and osteoporosis presented after syncopal event and was found to have aspiration pneumonia thought to be secondary to seizures    #Rehab  - continue PT/OT/SLP  - 1/13 patient was discussed this morning in team conference, see medilinks notes for details    #New onset seizures  - patient with syncopal episode in assisted living facility and then seizure activity while in ED  - spot EEG negative  - CT head and MRI brain negative for acute intracranial  process  - seen by neurology, Dr. Marney Doctor while in acute hospital  - seizure precautions  - continue with keppra 500mg  BID    #Hx of traumatic brain bleed/NPH  - CT and MRI showing chronic changes only; without evidence of hydrocephalus  - does have some cognitive impairments that may be secondary to this prior injury  - SLP will be working with patient on cognition    #Aspiration pneumonia  - thought to be secondary to seizure activity  - was on zosyn while in acute hospital and transitioned to augmentin to complete 5 day course on admission to rehab, stop date 1/16  - probiotic, incentive spirometer, duonebs PRN  - will need repeat CXR in 4-6 weeks for resolution    #HTN  - amlodipine 10mg     #DM  - HbA1c 7.6  - per daughter the patient's blood sugar elevates when she is stressed and  then it returns to normal, decision was made to hold off on starting an oral agent  - continue SSI  - 1/14 blood sugar remains elevated but daughter prefers to not have patient on standing medications    #HLD  - atorvastatin 40mg     #Hypothyroidism  - levothyroxine    #Anemia  - likely secondary to chronic disease  - Hb 10.5 on admission, continue to monitor with intermittent cbc    #T7 compression fracture  - age indeterminate with no acute findings on exam, continue to monitor    #Depression  - zoloft 50mg     #Rheumatoid arthritis  - on methotrexate qweekly, will confirm date and dose, likely start after abx completed    #Osteoporosis  - citracal-vitamin D 2 tab daily    #DVT ppx  - ASA 325    #Dispo  EDD 1/25    Patient Active Problem List   Diagnosis    Fall, initial encounter    Seizure after head injury    Debility       Continue comprehensive and intensive inpatient rehab program, including:   Physical therapy 60-120 min daily, 5-6 times per week, Occupational therapy 60-120 min daily, 5-6 times per week, Case management and Rehabilitation nursing    Signed by: Charlestine Massed, MD MD    [facility]    If there are questions or concerns about the content of this note or information contained within the body of this dictation they should be addressed directly with the author for clarification.

## 2020-04-14 NOTE — Rehab Progress Note ([facility]) (Signed)
NAMEPAISLYNN Cox  MRN: [id number]  Account: [id number]  Session Start: 04/14/2020 12:00:00 AM  Session Stop: 04/14/2020 12:00:00 AM    Total Treatment Minutes:  Minutes    Rehabilitation Nursing  Inpatient Rehabilitation Shift Assessment    Rehab Diagnosis: Closed head injury after a fall, Mild anterior wedge  compression fracture of T7  Demographics:            Age: 30Y            Gender: Female  Primary Language: English    Date of Onset:  04/09/2020  Date of Admission: 04/11/2020 5:40:00 PM    Rehabilitation Precautions Restrictions:   Fall precaution, skin precaution, seizure precaution, HOH, impaired vision,  delayed mental processing    Patient Report: "I'm okay."  Patient/Caregiver Goals:  "to get better"    Wounds/Incisions: See Epic    Medication Review: No clinically significant medication issues identified this  shift.    Bowel and Bladder Output:                       Bladder (# only)             Bowel (# only)  Number of Episodes  Continent            2                            0  Incontinent          0                            0    Additional Education Provided:    Education Provided: Precautions. Safety issues and interventions. Fall protocol.  Supervision requirements. Use of adaptive devices. Skin/wound care. Critical  pressure areas. Prevention of skin breakdown. Medication. Name and dosage.  Administration. Purpose.       Audience: Patient.       Mode: Explanation.  Demonstration.       Response: Applied knowledge.  Verbalized understanding.  Needs reinforcement.    TEAM CARE PLAN  Identified problems from team documentation:  Problem: Impaired Cognition  Cognition: Primary Team Goal: Pt will answer orientation and biographical  questions with >90% accuracy given min cuing in order to improve memory./    Problem: Impaired Leisure Skills  Leisure Skills: Primary Team Goal: Pt and caregiver will describe opportunities  and resources for social engagement in the community./    Problem: Impaired  Mobility  Mobility: Primary Team Goal: Pt will correctly demonstrate safe sit<>stand  transfers and ambulation using FWW under direct SUPV or touch assist via  clinical team./Active    Problem: Impaired Self-care Mgmt/ADL/IADL  Self Care: Primary Team Goal: Pt will complete toilet transfers and all  associated tasks with supervision using AD as indicated to ensure safety./    Problem: Safety Risk and Restraint  Safety: Primary Team Goal: Patient will recognized limitations and call for  assistance 100% of the time before getting out of bed./Active    Please review Integrated Patient View Care Plan Flowsheet for Team identified  Problems, Interventions, and Goals.    Signed by: Bertram Denver, RN 04/14/2020 2:55:00 PM

## 2020-04-14 NOTE — Rehab PPS CMG ([facility]) (Addendum)
Corrected 05/01/2020 3:29:38 PM    NAME: Yolanda Cox  MRN: [id number]  Account: [id number]  Session Start: 04/13/2020 12:00:00 AM  Session Stop: 04/13/2020 12:00:00 AM    Total Treatment Minutes:  Minutes    PPS CMG Coordinator  Inpatient Rehabilitation Admission    IRF Admission Date:  04/11/2020      Admission Class: Initial Rehab.  [facility]  Pre-Hospital Living: Home. Pre-Hospital Living  With: (1) Alone.  Payer Source: Primary: Medicare Fee for Service  Secondary: Not Listed.  Additional InformationCopywriter, advertising.  Ethnic Group:  Caucasian.  Marital Status:  Marital Status: Widowed.    Impairment Group: 02.1 Non-traumatic  Date of Onset of Impairment: 04/09/2020    Etiologic Diagnosis Code(s):   Rank Code      Description  1    R56.9     Unspecified convulsions    Comorbidities:   Rank Code      Description    1    J69.0     Pneumonitis due to inhalation of food and vomit  2    I10.      Essential (primary) hypertension  3    Z87.820   Personal history of traumatic brain injury  4    E03.9     Hypothyroidism, unspecified  5    E78.5     Hyperlipidemia, unspecified  6    E11.65    Type 2 diabetes mellitus with hyperglycemia    Are there any arthritis conditions recorded for Impairment Group, Etiologic  Diagnosis, or Comorbid Conditions that meet all of the regulatory requirements  for IRF classification (in 42 CFR 412.29(b)(2)(x), (xi), and xii))? No    Cognitive Patterns: Brief Interview for Mental Status (BIMS) was conducted.  Repetition of Three Words: Two words  Able to report correct year: Correct  Able to report correct month: Accurate within 5 days  Able to report correct day of the week: Incorrect or no answer  Able to recall "sock": Yes, no cue required  Able to recall "blue": Yes, after cuing  Able to recall "bed": Yes, after cuing    BIMS SUMMARY SCORE: 11 Moderately impaired Patient was able to complete the  Brief Interview for Mental Status    Self Care  Admission Performance                       Performance  GG0130. Self Care  Eating               5 - Setup or clean-up assistance  Oral hygiene         57 - Not attempted due to medical or safety concerns  Toileting hygiene    2 - Substantial/maximal assistance  Shower/bathe self    3 - Partial/moderate assistance  Upper body dressing  4 - Supervision or touching assistance  Lower body dressing  3 - Partial/moderate assistance  Footwear on/off      3 - Partial/moderate assistance  Mobility Admission Performance                       Performance  GG0170. Mobility  Roll left/right      3 - Partial/moderate assistance  Sit to lying         2 - Substantial/maximal assistance  Lying to sitting bed 2 - Substantial/maximal assistance  Sit to stand         2 - Substantial/maximal assistance  Chair/bed  transfer   2 - Substantial/maximal assistance  Toilet transfer      2 - Substantial/maximal assistance  Car transfer         3 - Partial/moderate assistance  Walk 10 feet         3 - Partial/moderate assistance  Walk 50 ft 2 turns   3 - Partial/moderate assistance  Walk 150 feet        88 - Not attempted due to medical or safety concerns  10 ft uneven surface 88 - Not attempted due to medical or safety concerns  1 step (curb)        88 - Not attempted due to medical or safety concerns  4 steps              9 - Not applicable  12 steps             9 - Not applicable  Picking up object    88 - Not attempted due to medical or safety concerns  Use wheelchair?      No    Care Tool Bowel and Bladder: Bladder Continence: Always continent (no documented  incontinence).  Bowel Continence: Not rated (patient had an ostomy or did not have a bowel  movement for the entire 3 days).    MEDICAL NEEDS  Height on Admission: 65 inches.  Weight on Admission: 157 pounds.  Swallowing/Nutritional Status: Regular food (solids and liquids swallowed safely  without supervision or modified food or liquid consistency).    QUALITY INDICATORS  Skin  Conditions: Unhealed Pressure Ulcer/Injuries at Stage 1 or Higher on  Admission:  No.    Active Diagnosis: Comorbidities and Co-existing Conditions:   Diabetes Mellitus  (DM) - e.g., diabetic retinopathy, nephropathy, and neuropathy).    Medication:  Potential Clinically Significant Medication Issues: No issues found during  review    Signed by: Fabian November, LPTA, PPS Coordinator 04/13/2020 5:00:00 PM

## 2020-04-15 LAB — GLUCOSE WHOLE BLOOD - POCT
Whole Blood Glucose POCT: 154 mg/dL — ABNORMAL HIGH (ref 70–100)
Whole Blood Glucose POCT: 195 mg/dL — ABNORMAL HIGH (ref 70–100)
Whole Blood Glucose POCT: 225 mg/dL — ABNORMAL HIGH (ref 70–100)
Whole Blood Glucose POCT: 166 mg/dL — ABNORMAL HIGH (ref 70–100)

## 2020-04-15 NOTE — Progress Notes (Signed)
IRF Physiatry Attending Face to Face Progress  Note  .[x] No new events    Subjective:  Patient seen this evening and found sleeping       Objective:  Vitals: BP 139/63    Pulse 61    Temp 97.3 F (36.3 C) (Oral)    Resp 15    Ht 1.651 m (5\' 5" )    Wt 71.3 kg (157 lb 3 oz)    SpO2 93%    BMI 26.16 kg/m   Appears well.In no apparent distress.Resting comfortably   Awake ; normal mood and affect.Sclerae anicteric.   Moist mucous membranes.   No respiratory distress .    Auscultation: Heart S1, S2, Chest CTAB, abd soft, normal BS      New labs   Results     Procedure Component Value Units Date/Time    Glucose Whole Blood - POCT [[id number]]  (Abnormal) Collected: 04/15/20 1654     Updated: 04/15/20 1700     Whole Blood Glucose POCT 225 mg/dL     Glucose Whole Blood - POCT [[id number]]  (Abnormal) Collected: 04/15/20 1159     Updated: 04/15/20 1206     Whole Blood Glucose POCT 195 mg/dL     Glucose Whole Blood - POCT [[id number]]  (Abnormal) Collected: 04/15/20 0748     Updated: 04/15/20 0755     Whole Blood Glucose POCT 154 mg/dL           Medications:     Current Facility-Administered Medications   Medication Dose Route Frequency    amLODIPine  10 mg Oral Daily    amoxicillin-clavulanate  1 tablet Oral Q12H [facility]    aspirin  325 mg Oral Daily    atorvastatin  40 mg Oral QHS    calcium citrate-vitamin D  2 tablet Oral Daily    folic acid  1 mg Oral Daily    insulin lispro  1-3 Units Subcutaneous QHS    insulin lispro  1-5 Units Subcutaneous TID AC    lactobacillus/streptococcus  1 capsule Oral Daily    levETIRAcetam  500 mg Oral Q12H [facility]    levothyroxine  75 mcg Oral Daily at 0600    multivitamin  1 tablet Oral Daily    sertraline  50 mg Oral Daily    vitamin D  25 mcg Oral Daily         Medication Review  1. A complete drug regimen review was completed: yes  2. Were drug issues were found during review: no    If yes to drug issues found during review:      Assessment/Plan: 87 y.o. female  with dysfunction of  mobility/ ADL due to Debility  Continue comprehensive intensive inpatient rehab program. Medically stable. Continue current management.         Signed by: Cheryll Dessert, MD MD  04/15/2020, 9:20 PM    [facility]

## 2020-04-15 NOTE — Rehab Progress Note ([facility]) (Addendum)
NAMEMARA Cox  MRN: [id number]  Account: [id number]  Session Start: 04/15/2020 12:00:00 AM  Session Stop: 04/15/2020 12:00:00 AM    Total Treatment Minutes:  Minutes    Rehabilitation Nursing  Inpatient Rehabilitation Shift Assessment    Rehab Diagnosis: Closed head injury after a fall, Mild anterior wedge  compression fracture of T7  Demographics:            Age: 73Y            Gender: Female  Primary Language: English    Date of Onset:  04/09/2020  Date of Admission: 04/11/2020 5:40:00 PM    Rehabilitation Precautions Restrictions:   Fall precaution, skin precaution, seizure precaution, HOH, impaired vision,  delayed mental processing    Patient Report: "Good"  Patient/Caregiver Goals:  "to get better"    Wounds/Incisions: See Epic    Medication Review: No clinically significant medication issues identified this  shift.    Bowel and Bladder Output:                       Bladder (# only)             Bowel (# only)  Number of Episodes  Continent            2                            0  Incontinent          0                            0    Additional Education Provided:    Education Provided: Precautions. Pain management. Pain scale. Medication  options. Plan of care. Safety issues and interventions. Fall protocol.  Supervision requirements. Fall prevention/balance training. Safety. Skin/wound  care. Signs/symptoms of infection. Prevention of skin breakdown. Signs /T/  Symptoms associated with debility Medication. Administration. Name and dosage.  Purpose. Side Effects. Interaction. Diabetes. Diet management. Insulin  drawing/mixing/administration. Glucose testing.       Audience: Patient.       Mode: Explanation.       Response: Applied knowledge.  Verbalized understanding.  Needs reinforcement.    Education Provided: No education provided this session.    TEAM CARE PLAN  Identified problems from team documentation:  Problem: Impaired Cognition  Cognition: Primary Team Goal: Pt will answer orientation and  biographical  questions with >90% accuracy given min cuing in order to improve memory./    Problem: Impaired Leisure Skills  Leisure Skills: Primary Team Goal: Pt and caregiver will describe opportunities  and resources for social engagement in the community./    Problem: Impaired Mobility  Mobility: Primary Team Goal: Pt will correctly demonstrate safe sit<>stand  transfers and ambulation using FWW under direct SUPV or touch assist via  clinical team./Active    Problem: Impaired Self-care Mgmt/ADL/IADL  Self Care: Primary Team Goal: Pt will complete toilet transfers and all  associated tasks with supervision using AD as indicated to ensure safety./    Problem: Safety Risk and Restraint  Safety: Primary Team Goal: Patient will recognized limitations and call for  assistance 100% of the time before getting out of bed./Active    Please review Integrated Patient View Care Plan Flowsheet for Team identified  Problems, Interventions, and Goals.    Signed by: Jiles Prows, RN 04/15/2020 2:39:00 AM

## 2020-04-15 NOTE — Progress Notes (Signed)
MEDICINE PROGRESS NOTE  Luismiguel Lamere R. Geoffery Spruce MD    Date Time: 04/15/20 11:11 AM  Patient Name: Yolanda Cox  Attending Physician: Arizona Constable, MD, MD    CC: Medical management    Interval History/24 hour events: Tolerating PT slept better, no new complaints overnight    Patient is an 87 y.o. right hand-dominant female with prior traumatic brain bleed, NPH, HTN, DM, HLD, hypothyroidism, RA, and osteoporosis who presented to the ED from [facility] assisted living after having a syncopal episode and hitting her head. Reportedly patient was altered while in the ambulance and had seizure activity while in the ED. CT head was done and showed no evidence of acute intracranial injury. EEG was done and was reported as normal. MRI brain showed moderate generalized intraparenchymal volume loss with moderately advanced chronic microvascular changes but no acute changes. Neurology was consulted and patient was started on keppra. She was found to have pneumonia, which was thought likely to be secondary to aspiration from seizure activity. She was started initially on IV abx and then transitioned to oral by the time of discharge. Per documentation the remainder of her hospitalization was unremarkable. She was evaluated by PT/OT/SLP and deemed appropriate for acute inpatient rehab and was transferred to the [facility] inpatient rehab unit on 04/11/2020.   Review of Systems:   Review of Systems - Negative except As in HPI      Physical Exam:     Patient Vitals for the past 24 hrs:   BP Temp Temp src Pulse Resp SpO2   04/15/20 0925 116/56 -- -- 70 -- 97 %   04/15/20 0556 144/71 97.5 F (36.4 C) Oral 64 16 95 %   04/14/20 1607 129/64 98.4 F (36.9 C) Oral 61 17 96 %       Intake and Output Summary (Last 24 hours) at Date Time    Intake/Output Summary (Last 24 hours) at 04/15/2020 1111  Last data filed at 04/15/2020 0900  Gross per 24 hour   Intake 700 ml   Output 0 ml   Net 700 ml       General: awake, alert, oriented x 1;  no acute distress.  Cardiovascular: regular rate and rhythm, no murmurs, rubs or gallops  Lungs: clear to auscultation bilaterally, without wheezing, rhonchi, or rales  Abdomen: soft, non-tender, non-distended; no palpable masses, no hepatosplenomegaly, normoactive bowel sounds, no rebound or guarding  Extremities: no clubbing, cyanosis, or edema  Other: left side of scalp some tenderness    Meds:     Medications were reviewed:    Labs:     Results     Procedure Component Value Units Date/Time    Glucose Whole Blood - POCT [[id number]]  (Abnormal) Collected: 04/15/20 0748     Updated: 04/15/20 0755     Whole Blood Glucose POCT 154 mg/dL     Glucose Whole Blood - POCT [[id number]]  (Abnormal) Collected: 04/14/20 2027     Updated: 04/14/20 2034     Whole Blood Glucose POCT 235 mg/dL     Glucose Whole Blood - POCT [[id number]]  (Abnormal) Collected: 04/14/20 1605     Updated: 04/14/20 1610     Whole Blood Glucose POCT 210 mg/dL     Glucose Whole Blood - POCT [[id number]]  (Abnormal) Collected: 04/14/20 1456     Updated: 04/14/20 1459     Whole Blood Glucose POCT 216 mg/dL     Glucose Whole Blood - POCT [[id number]]  (Abnormal) Collected: 04/14/20  1204     Updated: 04/14/20 1207     Whole Blood Glucose POCT 258 mg/dL             Imaging personally reviewed, including:       Assessment:     Patient Active Problem List   Diagnosis    Fall, initial encounter    Seizure after head injury    Debility           Plan:   -Continue Keppra 500 twice daily  -Finish course of Augmentin for aspiration pneumonia  -Continue amlodipine for hypertension  -Blood sugar running high hemoglobin A1c was 7.6, (per daughter blood sugar goes and she is under stress doesn't want any oral hypoglycemic at present we'll monitor  -Was on methotrexate for rheumatoid arthritis as outpatient may resume soon  -On aspirin 325 for DVT prophylaxis        Disposition:   Today's date: 04/15/2020        Signed by: Arizona Constable, MD, MD

## 2020-04-15 NOTE — Rehab Progress Note ([facility]) (Signed)
Yolanda Cox  MRN: [id number]  Account: [id number]  Session Start: 04/15/2020 12:00:00 AM  Session Stop: 04/15/2020 12:00:00 AM    Total Treatment Minutes:  Minutes    Rehabilitation Nursing  Inpatient Rehabilitation Shift Assessment    Rehab Diagnosis: Closed head injury after a fall, Mild anterior wedge  compression fracture of T7  Demographics:            Age: 42Y            Gender: Female  Primary Language: English    Date of Onset:  04/09/2020  Date of Admission: 04/11/2020 5:40:00 PM    Rehabilitation Precautions Restrictions:   Fall precaution, skin precaution, seizure precaution, HOH, impaired vision,  delayed mental processing    Patient Report: "I slept OK"  Patient/Caregiver Goals:  "to get better"    Wounds/Incisions: See Epic    Medication Review: No clinically significant medication issues identified this  shift.    Bowel and Bladder Output:                       Bladder (# only)             Bowel (# only)  Number of Episodes  Continent            2                            0  Incontinent          0                            0    Additional Education Provided:    Education Provided: Safety issues and interventions. Fall protocol. Supervision  requirements. Skin/wound care. Prevention of skin breakdown. Fall  prevention/balance training. Safety. Seizure Precautions. .       Audience: Patient.       Mode: Explanation.       Response: Needs reinforcement.    TEAM CARE PLAN  Identified problems from team documentation:  Problem: Impaired Cognition  Cognition: Primary Team Goal: Pt will answer orientation and biographical  questions with >90% accuracy given min cuing in order to improve memory./    Problem: Impaired Leisure Skills  Leisure Skills: Primary Team Goal: Pt and caregiver will describe opportunities  and resources for social engagement in the community./    Problem: Impaired Mobility  Mobility: Primary Team Goal: Pt will correctly demonstrate safe sit<>stand  transfers and ambulation using  FWW under direct SUPV or touch assist via  clinical team./Active    Problem: Impaired Self-care Mgmt/ADL/IADL  Self Care: Primary Team Goal: Pt will complete toilet transfers and all  associated tasks with supervision using AD as indicated to ensure safety./    Problem: Safety Risk and Restraint  Safety: Primary Team Goal: Patient will recognized limitations and call for  assistance 100% of the time before getting out of bed./Active    Please review Integrated Patient View Care Plan Flowsheet for Team identified  Problems, Interventions, and Goals.    Signed by: Harlin Rain, RN 04/15/2020 2:47:00 PM

## 2020-04-15 NOTE — Progress Notes (Signed)
Patient stood up by herself from wheel chair and tried to go back to bed without calling.   This RN assisted her to bed and now patient is resting on bed. Notified supervisor, Hilda Lias, RN. [employer] initiated. Fall color changed to red.

## 2020-04-16 LAB — GLUCOSE WHOLE BLOOD - POCT
Whole Blood Glucose POCT: 152 mg/dL — ABNORMAL HIGH (ref 70–100)
Whole Blood Glucose POCT: 223 mg/dL — ABNORMAL HIGH (ref 70–100)
Whole Blood Glucose POCT: 192 mg/dL — ABNORMAL HIGH (ref 70–100)
Whole Blood Glucose POCT: 169 mg/dL — ABNORMAL HIGH (ref 70–100)

## 2020-04-16 NOTE — Rehab Progress Note ([facility]) (Signed)
Yolanda Cox  MRN: [id number]  Account: [id number]  Session Start: 04/16/2020 12:00:00 AM  Session Stop: 04/16/2020 12:00:00 AM    Total Treatment Minutes:  Minutes    Rehabilitation Nursing  Inpatient Rehabilitation Shift Assessment    Rehab Diagnosis: Closed head injury after a fall, Mild anterior wedge  compression fracture of T7  Demographics:            Age: 30Y            Gender: Female  Primary Language: English    Date of Onset:  04/09/2020  Date of Admission: 04/11/2020 5:40:00 PM    Rehabilitation Precautions Restrictions:   Fall precaution, skin precaution, seizure precaution, HOH, impaired vision,  delayed mental processing    Patient Report: "I'm fine"  Patient/Caregiver Goals:  "to get better"    Wounds/Incisions: See Epic    Medication Review: No clinically significant medication issues identified this  shift.    Bowel and Bladder Output:                       Bladder (# only)             Bowel (# only)  Number of Episodes  Continent            3                            0  Incontinent          0                            0    Additional Education Provided:    Education Provided: Safety issues and interventions. Fall protocol. Supervision  requirements. Skin/wound care. Prevention of skin breakdown. Fall  prevention/balance training. Precautions. Safety.       Audience: Patient.       Mode: Explanation.       Response: Verbalized understanding.    TEAM CARE PLAN  Identified problems from team documentation:  Problem: Impaired Cognition  Cognition: Primary Team Goal: Pt will answer orientation and biographical  questions with >90% accuracy given min cuing in order to improve memory./    Problem: Impaired Leisure Skills  Leisure Skills: Primary Team Goal: Pt and caregiver will describe opportunities  and resources for social engagement in the community./    Problem: Impaired Mobility  Mobility: Primary Team Goal: Pt will correctly demonstrate safe sit<>stand  transfers and ambulation using FWW  under direct SUPV or touch assist via  clinical team./Active    Problem: Impaired Self-care Mgmt/ADL/IADL  Self Care: Primary Team Goal: Pt will complete toilet transfers and all  associated tasks with supervision using AD as indicated to ensure safety./    Problem: Safety Risk and Restraint  Safety: Primary Team Goal: Patient will recognized limitations and call for  assistance 100% of the time before getting out of bed./Active    Please review Integrated Patient View Care Plan Flowsheet for Team identified  Problems, Interventions, and Goals.    Signed by: Harlin Rain, RN 04/16/2020 3:47:00 PM

## 2020-04-16 NOTE — Rehab Progress Note ([facility]) (Signed)
Yolanda Cox  MRN: [id number]  Account: [id number]  Session Start: 04/16/2020 9:00:00 AM  Session Stop: 04/16/2020 10:00:00 AM    Total Treatment Minutes: 60.00 Minutes    Physical Therapy  Inpatient Rehabilitation Progress Note - Brief    Rehab Diagnosis: Closed head injury after a fall, Mild anterior wedge  compression fracture of T7  Demographics:            Age: 61Y            Gender: Female  Rehabilitation Precautions/Restrictions:   Fall precaution, skin precaution, seizure precaution, HOH, impaired vision,  delayed mental processing    SUBJECTIVE  Patient Report: "I felt like I was wobbly standing there"  Pain: Patient currently without complaints of pain.    OBJECTIVE  Vital Signs:                       Before Activity              After Activity  Vitals  Position/Activity    seated                       seated  BP Systolic          144                          134  BP Diastolic         64                           65  Pulse                64                           72  O2 Saturation        96                           99    Interventions:       Therapeutic Activities:  Pt received in room supine in bed completing  breakfast, RN cleared for therapy and pt agreeable. Pt SBA for supine>sit,  required SBA at bedside to change out of pajamas, pt stood EOB SBA-CGA to  doff/don pants with RW. Pt ambulated EOB to sink w/ SBA, stood x10 min with  intermittent UE support of counter while performing morning ADLs/routine,  SBA-SPV for safety. In therapy gym, pt ambulated 2x60 ft bouts with RW and SBA,  min VC for keeping walker close to body and for step length and foot clearance.  For BLE strengthening cardiovascular endurance, and to encourage inc hip/knee  flex with gait, performed standing marching in place 2x20 reps SBA with RW  followed by alternating toe taps on 6" step 2x30 reps with seated rest. At end  of session, pt ambulated x50 ft with RW, SBA back to EOB, transitioned supine  SBA.  Patient was  returned to or left in bed. Handoff to nurse completed.  Bed alarm in place and activated.  Oriented to call bell and placed within reach.  Personal items within reach.  Assistive devices positioned out of reach.  Bed placed in lowest position.  Telesitter in use.  Pain Reassessment: Pain was not reassessed as no pain was reported.    Education  Provided:    Education Provided: Activities of daily living. Functional transfers. Fall  prevention/balance training. Safety. Equipment. Gait. Home exercise/activity  plan.       Audience: Patient.       Mode: Explanation.  Demonstration.       Response: Applied knowledge.  Verbalized understanding.  Demonstrated skill.  Needs practice.  Needs reinforcement.    ASSESSMENT  Pt tolerated session well, demonstrates improved overall safety with transfers  and ambulation, requiring less steadying assistance today and with no  LOB/issues. Pt does continue to require verbal cues for safe walker mgmt at  times, particularly with turns. Pt will benefit from cont PT POC to maximize  safety and independence.      PLAN  Continued Physical Therapy is recommended.  Recommended Frequency/Duration/Intensity: 60-120 minutes, 5-7 days a week for  10-14 days from IE on 04/12/2020.  D/C set for 04/25/2020.  Activities Contributing Toward Care Plan: TherActivities, TherEx, Transfer  training, gait training, static and dynamic balance training in sitting and  standing, cardiovascular endurance training, B LE strength training. Pt will  benefit from skilled care to improve safety, decrease fall risk, and reduce  caregiver burden, [place] planning as part of the interdisciplinary team.    3 Hour Rule Minutes: 60 minutes of PT treatment this session count towards  intensity and duration of therapy requirement. Patient was seen for the full  scheduled time of PT treatment this session.  Therapy Mode Minutes: Individual: 60 minutes.    Signed by: [place], PT 04/16/2020 10:00:00 AM

## 2020-04-16 NOTE — Rehab Progress Note ([facility]) (Signed)
Yolanda Cox  MRN: [id number]  Account: [id number]  Session Start: 04/16/2020 11:00:00 AM  Session Stop: 04/16/2020 12:00:00 PM    Total Treatment Minutes: 60.00 Minutes    Occupational Therapy  Inpatient Rehabilitation Progress Note - Brief    Rehab Diagnosis: Closed head injury after a fall, Mild anterior wedge  compression fracture of T7  Demographics:            Age: 69Y            Gender: Female  Rehabilitation Precautions/Restrictions:   Fall precaution, skin precaution, seizure precaution, HOH, impaired vision,  delayed mental processing    SUBJECTIVE  Patient Report: pt was pleasant and cooperative during session  Pain: Patient currently without complaints of pain.    OBJECTIVE  Vital Signs:                       Before Activity              After Activity  Vitals  Time                 1105                         1155  Position/Activity    supine in bed w/ HOB up      supine in bed w/ HOB up  BP Systolic          127                          133  BP Diastolic         65                           69  Pulse                64                           64    Interventions:       Self Care/Home Management:  Pt. assessed for toileting routine including  walking in and out of bathroom, voiding bladder w/ hyg and clothing management  then transitioning to sink for hand hyg all w/ SBA using FWW/GBs as needed and  Min verbal cues for safe sequence of steps w/ demo of understanding       Therapeutic Exercise:  Progressive resistance BUE ex w/ 2# free weight for  elbow flex/ext 3x15 then 1x10 for shoulder flexion w/ pt. rating "too hard" so  downgraded to 1# free weight for remaining 2x10 and then  stayed at 1# free  weight for chest press 3x10 all w/ Min verbal cues for proper form w/ demo of  understanding.       Therapeutic Activities:  Pt. assessed for STS and SPT w/ FWW and SBA w/ Min  verbal cues initally for hand placement but goo carryover w/ visual aid in  nplace if needed. Dyn standing balance training  for 9:21 unsupported w/ SBA and  no LOB w/ some mild c/o fatigue after task all to impmrove standing  tolerance/balance for increaed performance w/ all daily occupations.  Patient was returned to or left in bed. Handoff to nurse completed.  Bed alarm in place and activated.  Oriented to call bell and placed within reach.  Personal  items within reach.  Assistive devices positioned out of reach.  Bed placed in lowest position.  Pain Reassessment: Pain was not reassessed as no pain was reported.    Education Provided:    Education Provided: Plan of care. Activities of daily living. Functional  transfers. Safety. Fall prevention/balance training.       Audience: Patient.       Mode: Explanation.  Demonstration.       Response: Applied knowledge.  Verbalized understanding.  Needs practice.    ASSESSMENT  Pt. was pleasant and cooperative throughout session w/o c/o pain. Pt. was able  to demo understandin of proper hand placement during STS trasnfers but w/ SPT  pt. does still need some cueing for fully pi voting and reaching back prior to s  itting. Pt. w/ slow pace for all tasks but no impulsivity or unsafe behaviors  noted during session    CARE Tool  Self-Care Functional Assessment    Toileting Hygiene: 04 - Touching Assistance: Helper provides  touching/steadying/contact guard  Toileting Equipment:   Scientist, research (physical sciences) Device(s):   Adaptive device to maintain balance, Grab bar   increased time and min verbal cues to initiate task    Mobility Functional Assessment    Sit to Stand: 04 - Supervision: Helper provides supervision for safety or verbal  cues ONLY  Assistive Device(s):   Rolling walker   verbal cues for proper hand placement    Chair/Bed-to-Chair Transfer: 04 - Touching Assistance: Helper provides  touching/steadying/contact guard  Assistive Device(s):   Rolling walker    Toilet Transfer: 04 - Touching Assistance: Helper provides  touching/steadying/contact guard  Assistive Device(s):   Grab bars, Rolling  walker   min verbal cues to fully pivot w/ demo of understanding    PLAN  Continued Occupational Therapy is recommended.  Recommended Frequency/Duration/Intensity: 60-120 min/day, 5-6 days/week, for  approx 10-14 days from evaluation on 04/13/2019; 1:1 therapy or group treatment  as appropriate.  Continued Activities Contributing Toward Care Plan: ADL/IADL retraining, ther  ex, ther act, modalities, DME recommendations, d/c planning, equipment, family  education/training, functional mobility/transfer training, energy conservation,  balance training, fall prevention training, functional activity tolerance  training, NMRE, cognitive retraining, ARMEO, RTI, Bioness    3 Hour Rule Minutes: 60 minutes of OT treatment this session count towards  intensity and duration of therapy requirement. Patient was seen for the full  scheduled time of OT treatment this session.  Therapy Mode Minutes: Individual: 60 minutes.    Signed by: Vinetta Bergamo, OT 04/16/2020 12:00:00 PM

## 2020-04-16 NOTE — Rehab Progress Note ([facility]) (Signed)
Yolanda Cox  MRN: [id number]  Account: [id number]  Session Start: 04/16/2020 2:00:00 PM  Session Stop: 04/16/2020 3:00:00 PM    Total Treatment Minutes: 60.00 Minutes    Occupational Therapy  Inpatient Rehabilitation Treatment Note    Rehab Diagnosis: Closed head injury after a fall, Mild anterior wedge  compression fracture of T7  Demographics:            Age: 53Y            Gender: Female  Rehabilitation Precautions/Restrictions:   Fall precaution, skin precaution, seizure precaution, HOH, impaired vision,  delayed mental processing    Vital Signs:                       Before Activity              After Activity  Vitals  Time                 -                            1455  Position/Activity    -                            supine in bed  BP Systolic          -                            147  BP Diastolic         -                            69  Pulse                -                            66    Interventions:       Self Care/Home Management:  Pt. assessed for functional mobility and toilet  transfer w/ FWW and GBs as needed w/ CGA-SBA w/ no LOB. Toileting routine  assessed after BM w/ pt. able to [facility] clothes w/ increased time and SBA however  unable to reach behind to perform hyg and was attempting to wipe back->front so  therapist assisted w/ Max A for hyg and pt. edu on importance of wiping front to  back to prevent infection w/ pt. verbalizing understanding. Pt. assessd for hand  hyg standing at sink w/ SBA and increased time and min verbal cues to dispose of  papertowel properly w/ demo of understaning.       Therapeutic Activities:  Pt. was found seated by window w/ daughter present  at start of session w/o c/o pain. Daughter reported IPAD was set up for use of  direct TV when in bed and requested someone assist her mom w/ setting it up.  Therapist assisted pt. to determine most watched shows to add to "favorites" and  then trained pt on sequence of steps to log into ipad, find appropriate  app and  search channels for Live TV w/ pt. able to demo understanding after mass  practice but continues to have difficulty following inructions on screen at time  d/t slow pace so still will need Supervision-Min A  to manage it. Bed mob.  assessed sit->sup Mod Ind using bed rails and for increased time.  Patient was returned to or left in bed. Handoff to nurse completed.  Bed alarm in place and activated.  Oriented to call bell and placed within reach.  Personal items within reach.  Assistive devices positioned out of reach.  Bed placed in lowest position.    ASSESSMENT  Pt. was pleasant and cooperative throughout session. Pt. was trained on sequence  of steps to use Ipad to watch direct TV live shows while in bed w/ pt. able to  demo sequence on last trial but at times due to long nails and decreased  familiarity w/ device was having trouble clicking an image such as the volume  and then right away performing the next action like sliding volume up/down  before the screen went back to the orginal state. So further practice on pushing  the spot on the screen she wants and immediatly followd by the next button to  push to ensure accuracy w/ use and decreasing frustration when watching TV.    3 Hour Rule Minutes: 60 minutes of OT treatment this session count towards  intensity and duration of therapy requirement. Patient was seen for the full  scheduled time of OT treatment this session.  Therapy Mode Minutes: Individual: 60 minutes.    Signed by: Vinetta Bergamo, OT 04/16/2020 3:00:00 PM

## 2020-04-16 NOTE — Rehab Progress Note ([facility]) (Addendum)
Yolanda Cox  MRN: [id number]  Account: [id number]  Session Start: 04/16/2020 12:00:00 AM  Session Stop: 04/16/2020 12:00:00 AM    Total Treatment Minutes:  Minutes    Rehabilitation Nursing  Inpatient Rehabilitation Shift Assessment    Rehab Diagnosis: Closed head injury after a fall, Mild anterior wedge  compression fracture of T7  Demographics:            Age: 30Y            Gender: Female  Primary Language: English    Date of Onset:  04/09/2020  Date of Admission: 04/11/2020 5:40:00 PM    Rehabilitation Precautions Restrictions:   Fall precaution, skin precaution, seizure precaution, HOH, impaired vision,  delayed mental processing    Patient Report: " am good"  Patient/Caregiver Goals:  "to get better"    Wounds/Incisions: See Epic    Medication Review: No clinically significant medication issues identified this  shift.    Bowel and Bladder Output:                       Bladder (# only)             Bowel (# only)  Number of Episodes  Continent            2                            0  Incontinent          0                            0    Additional Education Provided:    Education Provided: Precautions. Pain management. Pain scale. Medication  options. Side effects. Clinical indicators of pain. Pain management. Plan of  care. Safety issues and interventions. Fall protocol. Use of adaptive devices.  Supervision requirements. Skin/wound care. Signs/symptoms of infection.  Prevention of skin breakdown. Fall prevention/balance training. Bed mobility.  Safety. Signs /T/ Symptoms associated with debility  Infections  Pressure ulcer  Pulmonary complications  Prevention of contractures       Audience: Patient.       Mode: Explanation.  Demonstration.       Response: Applied knowledge.  Verbalized understanding.  Demonstrated skill.  Needs reinforcement.    TEAM CARE PLAN  Identified problems from team documentation:  Problem: Impaired Cognition  Cognition: Primary Team Goal: Pt will answer orientation and  biographical  questions with >90% accuracy given min cuing in order to improve memory./    Problem: Impaired Leisure Skills  Leisure Skills: Primary Team Goal: Pt and caregiver will describe opportunities  and resources for social engagement in the community./    Problem: Impaired Mobility  Mobility: Primary Team Goal: Pt will correctly demonstrate safe sit<>stand  transfers and ambulation using FWW under direct SUPV or touch assist via  clinical team./Active    Problem: Impaired Self-care Mgmt/ADL/IADL  Self Care: Primary Team Goal: Pt will complete toilet transfers and all  associated tasks with supervision using AD as indicated to ensure safety./    Problem: Safety Risk and Restraint  Safety: Primary Team Goal: Patient will recognized limitations and call for  assistance 100% of the time before getting out of bed./Active    Please review Integrated Patient View Care Plan Flowsheet for Team identified  Problems, Interventions, and Goals.    Signed by: Jiles Prows,  RN 04/16/2020 1:41:00 AM

## 2020-04-17 LAB — GLUCOSE WHOLE BLOOD - POCT
Whole Blood Glucose POCT: 148 mg/dL — ABNORMAL HIGH (ref 70–100)
Whole Blood Glucose POCT: 229 mg/dL — ABNORMAL HIGH (ref 70–100)
Whole Blood Glucose POCT: 232 mg/dL — ABNORMAL HIGH (ref 70–100)
Whole Blood Glucose POCT: 179 mg/dL — ABNORMAL HIGH (ref 70–100)

## 2020-04-17 NOTE — Rehab Progress Note ([facility]) (Addendum)
NAMECALVIN Cox  MRN: [id number]  Account: [id number]  Session Start: 04/17/2020 12:00:00 AM  Session Stop: 04/17/2020 12:00:00 AM    Total Treatment Minutes:  Minutes    Rehabilitation Nursing  Inpatient Rehabilitation Shift Assessment    Rehab Diagnosis: Closed head injury after a fall, Mild anterior wedge  compression fracture of T7  Demographics:            Age: 41Y            Gender: Female  Primary Language: English    Date of Onset:  04/09/2020  Date of Admission: 04/11/2020 5:40:00 PM    Rehabilitation Precautions Restrictions:   Fall precaution, skin precaution, seizure precaution, HOH, impaired vision,  delayed mental processing    Patient Report: " Good"  Patient/Caregiver Goals:  "to get better"    Wounds/Incisions: See Epic    Medication Review: No clinically significant medication issues identified this  shift.    Bowel and Bladder Output:                       Bladder (# only)             Bowel (# only)  Number of Episodes  Continent            2                            1  Incontinent          0                            0    Additional Education Provided:    Education Provided: Precautions. Pain management. Pain scale. Medication  options. Side effects. Clinical indicators of pain. Plan of care. Safety issues  and interventions. Fall protocol. Supervision requirements. Use of adaptive  devices. Bed mobility. Activities of daily living. Fall prevention/balance  training. Safety. Plan of care. Gait. Skin/wound care. Signs/symptoms of  infection. Prevention of skin breakdown. Types of debility  Signs /T/ Symptoms associated with debility  Verbalize detrimental effects of bedrest/inactivity  Emotional adjustments/depression  Muscle weakness  Infections  Pressure ulcer  Prevention of contractures Medication. Name and dosage. Administration. Purpose.  Side Effects. Interaction. Diabetes. Glucose testing. Insulin  drawing/mixing/administration. Diet management. Hypoglycemia  causes/symptoms/treatment.  Hyperglycemia causes/symptoms/treatment.       Audience: Patient.       Mode: Explanation.  Demonstration.       Response: Applied knowledge.  Verbalized understanding.  Needs reinforcement.    TEAM CARE PLAN  Identified problems from team documentation:  Problem: Impaired Cognition  Cognition: Primary Team Goal: Pt will answer orientation and biographical  questions with >90% accuracy given min cuing in order to improve memory./    Problem: Impaired Leisure Skills  Leisure Skills: Primary Team Goal: Pt and caregiver will describe opportunities  and resources for social engagement in the community./    Problem: Impaired Mobility  Mobility: Primary Team Goal: Pt will correctly demonstrate safe sit<>stand  transfers and ambulation using FWW under direct SUPV or touch assist via  clinical team./Active    Problem: Impaired Self-care Mgmt/ADL/IADL  Self Care: Primary Team Goal: Pt will complete toilet transfers and all  associated tasks with supervision using AD as indicated to ensure safety./    Problem: Safety Risk and Restraint  Safety: Primary Team Goal: Patient will recognized limitations and call  for  assistance 100% of the time before getting out of bed./Active    Please review Integrated Patient View Care Plan Flowsheet for Team identified  Problems, Interventions, and Goals.    Signed by: Jiles Prows, RN 04/17/2020 12:34:00 AM

## 2020-04-17 NOTE — Progress Notes (Signed)
PHYSICAL MEDICINE AND REHABILITATION  PROGRESS NOTE -- FACE-TO-FACE ENCOUNTER    Date Time: 04/17/20 3:19 PM  Patient Name: Yolanda Cox, Yolanda Cox    Admission date:  04/11/2020    Subjective:     No acute events overnight. Patient feels well today without current complaints. Had a long day of therapy so is tired but says it went well.     Functional Status:     PT:  Pt tolerated session well, demonstrates improved overall safety with transfers  and ambulation, requiring less steadying assistance today and with no  LOB/issues. Pt does continue to require verbal cues for safe walker mgmt at  times, particularly with turns. Pt will benefit from cont PT POC to maximize  safety and independence.    OT:  Pt. slightly lethargic upon start of session and requested washing up at sink  vs. shower this AM d/t pts. regular schedule is sleeping much later. Pt. was  able to stand for /R/15 min. w/o UE support most of the time w/ some posterior  sway but no LOB and needing SBA only but did demo good saftety by sitting to  wash B feet to prevent falls. Pt. contiues to require Min cues to sequence steps  for transitioning stand->sit as well as safe use of FWW during mobility and  further training is needed.      SLP:  Pt demonstrating ability to utilize memory book to increase insight and recall  of personal information when provided with min/mod verbal cues by SLP. Pt  expressing interest and motivation in using memory book. Would continue to  benefit from SLP intervention to increase insight and ability to participate in  ADL's safely.    Medications:   Medication reviewed by me:     Scheduled Meds: PRN Meds:   amLODIPine, 10 mg, Oral, Daily  aspirin, 325 mg, Oral, Daily  atorvastatin, 40 mg, Oral, QHS  calcium citrate-vitamin D, 2 tablet, Oral, Daily  folic acid, 1 mg, Oral, Daily  insulin lispro, 1-3 Units, Subcutaneous, QHS  insulin lispro, 1-5 Units, Subcutaneous, TID AC  lactobacillus/streptococcus, 1 capsule, Oral,  Daily  levETIRAcetam, 500 mg, Oral, Q12H [facility]  levothyroxine, 75 mcg, Oral, Daily at 0600  multivitamin, 1 tablet, Oral, Daily  sertraline, 50 mg, Oral, Daily  vitamin D, 25 mcg, Oral, Daily        Continuous Infusions:   acetaminophen, 650 mg, Q6H PRN  albuterol-ipratropium, 3 mL, Q6H PRN  butalbital-acetaminophen-caffeine, 1 tablet, Q4H PRN  dextrose, 15 g of glucose, PRN   And  dextrose, 12.5 g, PRN   And  glucagon (rDNA), 1 mg, PRN  magnesium hydroxide, 30 mL, Daily PRN  melatonin, 3 mg, QHS PRN  ondansetron, 4 mg, Q8H PRN  senna-docusate, 2 tablet, QHS PRN          Medication Review  1. A complete drug regimen review was completed: Yes  2. Were any drug issues found during review?: No      Review of Systems:   A comprehensive review of systems was: No fevers, chills, nausea, vomiting, chest pain, shortness of breath, cough, headache, double vision.  All others negative.    Physical Exam:     Vitals:    04/17/20 0805 04/17/20 0851 04/17/20 0909 04/17/20 1008   BP: 137/71 145/80 125/76 135/70   Pulse: 64 67  66   Resp:       Temp:       TempSrc:       SpO2:  Weight:       Height:           Intake and Output Summary (Last 24 hours) at Date Time    Intake/Output Summary (Last 24 hours) at 04/17/2020 1519  Last data filed at 04/17/2020 1100  Gross per 24 hour   Intake 400 ml   Output 0 ml   Net 400 ml     P.O.: 200 mL (04/17/20 1100)     Urine: 0 mL (04/17/20 1100)           General: WN/WD, in NAD, seen lying in bed  HEENT: NC/AT, moist mucosal membranes, anicteric sclera  Cardiac: regular rate, extremities warm and well perfused  Resp: non-labored breathing, acyanotic  Abdomen: soft, non-tender, non-distended  Neuro: Awake and alert, oriented to person and place, knows the month and year but not day, speech fluent and spontaneous without dysarthria but is slowed, mild proximal weakness but moves extremities with AG strength throughout    Labs:   No results for input(s): GLUCOSEWHOLE in the last 24  hours.    Recent Labs   Lab 04/12/20  0526 04/11/20  0455   WBC 8.42 11.34*   Hgb 10.5* 10.4*   Hematocrit 32.1* 31.5*   Platelets 177 164        Recent Labs   Lab 04/12/20  0526 04/11/20  0455   Sodium 140 141   Potassium 3.3* 3.8   Chloride 106 106   CO2 26 24   BUN 11.0 15.4   Creatinine 0.7 0.8   Calcium 9.4 8.7   Glucose 164* 226*             Results     Procedure Component Value Units Date/Time    Glucose Whole Blood - POCT [[id number]]  (Abnormal) Collected: 04/17/20 1149     Updated: 04/17/20 1156     Whole Blood Glucose POCT 229 mg/dL     Glucose Whole Blood - POCT [[id number]]  (Abnormal) Collected: 04/17/20 0744     Updated: 04/17/20 0750     Whole Blood Glucose POCT 148 mg/dL     Glucose Whole Blood - POCT [[id number]]  (Abnormal) Collected: 04/16/20 2048     Updated: 04/16/20 2054     Whole Blood Glucose POCT 169 mg/dL     Glucose Whole Blood - POCT [[id number]]  (Abnormal) Collected: 04/16/20 1639     Updated: 04/16/20 1651     Whole Blood Glucose POCT 192 mg/dL                Rads:   Radiological Procedure reviewed.  Radiology Results (24 Hour)     ** No results found for the last 24 hours. **              Assessment and Plan:     87yo F with prior traumatic brain bleed, NPH, HTN, DM, HLD, hypothyroidism, RA, and osteoporosis presented after syncopal event and was found to have aspiration pneumonia thought to be secondary to seizures    #Rehab  - continue PT/OT/SLP  - 1/13 patient was discussed this morning in team conference, see medilinks notes for details    #New onset seizures  - patient with syncopal episode in assisted living facility and then seizure activity while in ED  - spot EEG negative  - CT head and MRI brain negative for acute intracranial process  - seen by neurology, Dr. Marney Doctor while in acute hospital  - seizure precautions  - continue with keppra  500mg  BID    #Hx of traumatic brain bleed/NPH  - CT and MRI showing chronic changes only; without evidence of hydrocephalus  - does have some  cognitive impairments that may be secondary to this prior injury  - SLP will be working with patient on cognition    #Aspiration pneumonia  - thought to be secondary to seizure activity  - was on zosyn while in acute hospital and transitioned to augmentin to complete 5 day course on admission to rehab, stop date 1/16  - probiotic, incentive spirometer, duonebs PRN  - will need repeat CXR in 4-6 weeks for resolution  - 1/17 patient has completed abx, no current signs/symptoms of infection    #HTN  - amlodipine 10mg   - 1/17 BP at goal    #DM  - HbA1c 7.6  - per daughter the patient's blood sugar elevates when she is stressed and then it returns to normal, decision was made to hold off on starting an oral agent  - continue SSI  - 1/17 blood sugar remains elevated but daughter prefers to not have patient on standing medications    #HLD  - atorvastatin 40mg     #Hypothyroidism  - levothyroxine    #Anemia  - likely secondary to chronic disease  - Hb 10.5 on admission, continue to monitor with intermittent cbc    #T7 compression fracture  - age indeterminate with no acute findings on exam, continue to monitor    #Depression  - zoloft 50mg     #Rheumatoid arthritis  - on methotrexate qweekly, will confirm date and dose, likely start after abx completed    #Osteoporosis  - citracal-vitamin D 2 tab daily    #DVT ppx  - ASA 325    #Dispo  EDD 1/25    Patient Active Problem List   Diagnosis    Fall, initial encounter    Seizure after head injury    Debility       Continue comprehensive and intensive inpatient rehab program, including:   Physical therapy 60-120 min daily, 5-6 times per week, Occupational therapy 60-120 min daily, 5-6 times per week, Case management and Rehabilitation nursing    Signed by: Charlestine Massed, MD MD    [facility]    If there are questions or concerns about the content of this note or information contained within the body of this dictation they should  be addressed directly with the author for clarification.

## 2020-04-17 NOTE — Rehab Progress Note (Medilinks) (Signed)
NAMECRYSTINA BORRAYO  MRN: [id number]  Account: [id number]  Session Start: 04/17/2020 12:00:00 AM  Session Stop: 04/17/2020 12:00:00 AM    Total Treatment Minutes:  Minutes    Rehabilitation Nursing  Inpatient Rehabilitation Shift Assessment    Rehab Diagnosis: Closed head injury after a fall, Mild anterior wedge  compression fracture of T7  Demographics:            Age: 66Y            Gender: Female  Primary Language: English    Date of Onset:  04/09/2020  Date of Admission: 04/11/2020 5:40:00 PM    Rehabilitation Precautions Restrictions:   Fall precaution, skin precaution, seizure precaution, HOH, impaired vision,  delayed mental processing    Patient Report: I am good .  Patient/Caregiver Goals:  "to get better"    Wounds/Incisions: See Epic    Medication Review: No clinically significant medication issues identified this  shift.    Bowel and Bladder Output:                       Bladder (# only)             Bowel (# only)  Number of Episodes  Continent            3                            1  Incontinent          0                            0    Additional Education Provided:    Education Provided: Precautions. Plan of care. Safety issues and interventions.  Fall protocol. Supervision requirements. Medication. Name and dosage.  Administration. Purpose.       Audience: Patient.       Mode: Explanation.       Response: Needs reinforcement.    TEAM CARE PLAN  Identified problems from team documentation:  Problem: Impaired Cognition  Cognition: Primary Team Goal: Pt will answer orientation and biographical  questions with >90% accuracy given min cuing in order to improve memory./    Problem: Impaired Leisure Skills  Leisure Skills: Primary Team Goal: Pt and caregiver will describe opportunities  and resources for social engagement in the community./    Problem: Impaired Mobility  Mobility: Primary Team Goal: Pt will correctly demonstrate safe sit<>stand  transfers and ambulation using FWW under direct SUPV or touch  assist via  clinical team./Active    Problem: Impaired Self-care Mgmt/ADL/IADL  Self Care: Primary Team Goal: Pt will complete toilet transfers and all  associated tasks with supervision using AD as indicated to ensure safety./    Problem: Safety Risk and Restraint  Safety: Primary Team Goal: Patient will recognized limitations and call for  assistance 100% of the time before getting out of bed./Active    Please review Integrated Patient View Care Plan Flowsheet for Team identified  Problems, Interventions, and Goals.    Signed by: Boris Lown, RN 04/17/2020 3:03:00 PM

## 2020-04-17 NOTE — Progress Notes (Signed)
Bedside visit with patient. Introduced [facility] and role. Explained HH services recommended, SN, PT, OT, SLP. Pt receptive of services. Pt having difficulty verifying information and agreed for [facility] to contact her daughter to arrange home health. Provided pt w/ [facility] card if pt or her daughter have any questions about HH.    Called pt's daughter, [name], unable to reach or leave VM, VM box full. Will follow up.    Dustin Flock [facility] RN  [phone]

## 2020-04-17 NOTE — Rehab Progress Note (Medilinks) (Addendum)
Yolanda Cox  MRN: [id number]  Account: [id number]  Session Start: 04/17/2020 12:00:00 AM  Session Stop: 04/17/2020 12:00:00 AM    Total Treatment Minutes:  Minutes    Rehabilitation Nursing  Inpatient Rehabilitation Shift Assessment    Rehab Diagnosis: Closed head injury after a fall, Mild anterior wedge  compression fracture of T7  Demographics:            Age: 69Y            Gender: Female  Primary Language: English    Date of Onset:  04/09/2020  Date of Admission: 04/11/2020 5:40:00 PM    Rehabilitation Precautions Restrictions:   Fall precaution, skin precaution, seizure precaution, HOH, impaired vision,  delayed mental processing    Patient Report: OK.  Patient/Caregiver Goals:  "to get better"    Wounds/Incisions: See Epic    Medication Review: No clinically significant medication issues identified this  shift.    Bowel and Bladder Output:                       Bladder (# only)             Bowel (# only)  Number of Episodes  Continent            2                            0  Incontinent          0                            2    Additional Education Provided:    Education Provided: Precautions. Pain management. Pain scale. Medication  options. Side effects. Safety issues and interventions. Fall protocol.  Supervision requirements. Medication. Name and dosage. Administration. Purpose.       Audience: Patient.       Mode: Explanation.       Response: No evidence of learning.    TEAM CARE PLAN  Identified problems from team documentation:  Problem: Impaired Cognition  Cognition: Primary Team Goal: Pt will answer orientation and biographical  questions with >90% accuracy given min cuing in order to improve memory./    Problem: Impaired Leisure Skills  Leisure Skills: Primary Team Goal: Pt and caregiver will describe opportunities  and resources for social engagement in the community./    Problem: Impaired Mobility  Mobility: Primary Team Goal: Pt will correctly demonstrate safe sit<>stand  transfers and  ambulation using FWW under direct SUPV or touch assist via  clinical team./Active    Problem: Impaired Self-care Mgmt/ADL/IADL  Self Care: Primary Team Goal: Pt will complete toilet transfers and all  associated tasks with supervision using AD as indicated to ensure safety./    Problem: Safety Risk and Restraint  Safety: Primary Team Goal: Patient will recognized limitations and call for  assistance 100% of the time before getting out of bed./Active    Please review Integrated Patient View Care Plan Flowsheet for Team identified  Problems, Interventions, and Goals.    Deleted by: Boris Lown, RN(Billing incorrect.)

## 2020-04-17 NOTE — Rehab Progress Note (Medilinks) (Signed)
Yolanda Cox  MRN: [id number]  Account: [id number]  Session Start: 04/17/2020 8:00:00 AM  Session Stop: 04/17/2020 9:00:00 AM    Total Treatment Minutes: 60.00 Minutes    Occupational Therapy  Inpatient Rehabilitation Progress Note - Brief    Rehab Diagnosis: Closed head injury after a fall, Mild anterior wedge  compression fracture of T7  Demographics:            Age: 89Y            Gender: Female  Rehabilitation Precautions/Restrictions:   Fall precaution, skin precaution, seizure precaution, HOH, impaired vision,  delayed mental processing    SUBJECTIVE  Patient Report: patient pleasant and cooperative but not talking too much d/t  being tired  Pain: Patient currently without complaints of pain.    OBJECTIVE  Vital Signs:                       Before Activity              After Activity  Vitals  Time                 0805                         0850  Position/Activity    supine in bed                supine HOB up  BP Systolic          137                          145  BP Diastolic         71                           80  Pulse                64                           67    Interventions:       Self Care/Home Management:  Pt. was seen for ADL routine standing/sitting  at sinkside w/ pt. grossly Supervision except for removing urine soliled pull up  undergarment requiring Min A to fully get off R foot. Pt. required Min cues  throughout session to "stay inside walking" during mobility, reach back prior to  sitting on bed/chair, and sequence steps/use soap for bathing task w/ pt.  verbalizing understang to all but does need further training for reaching back  w/ trasnfers and staying inside walker during mobility to prevent falls. Pt. was  able to tolerate standing for /R/ 15 min. during sinkside ADL w/ posterior sway  but no LOB and use of UE support as needed for safety.  Patient was returned to or left in bed. Handoff to nurse completed.  Bed alarm in place and activated.  Oriented to call bell and placed  within reach.  Personal items within reach.  Assistive devices positioned out of reach.  Bed placed in lowest position.  Pain Reassessment: Pain was not reassessed as no pain was reported.    Education Provided:    Education Provided: Plan of care. Activities of daily living. Functional  transfers. Safety. Equipment.       Audience: Patient.  Mode: Explanation.  Demonstration.       Response: Applied knowledge.  Verbalized understanding.  Needs practice.    ASSESSMENT  Pt. slightly lethargic upon start of session and requested washing up at sink  vs. shower this AM d/t pts. regular schedule is sleeping much later. Pt. was  able to stand for /R/15 min. w/o UE support most of the time w/ some posterior  sway but no LOB and needing SBA only but did demo good saftety by sitting to  wash B feet to prevent falls. Pt. contiues to require Min cues to sequence steps  for transitioning stand->sit as well as safe use of FWW during mobility and  further training is needed.    CARE Tool  Self-Care Functional Assessment  Eating: 04 - Supervision: Helper provides supervision for safety or verbal cues  ONLY  Assistive Device(s):  None   Min verbal cues to problem solve different utensil  w/ spilling of eggs off  fork    Oral Hygiene: 04 - Supervision: Helper provides supervision for safety or verbal  cues ONLY  Assistive Device(s):   None  Context for oral hygiene:   Standing   occasional posterior sway but no LOB    Toileting Hygiene: 04 - Supervision: Helper provides supervision for safety or  verbal cues ONLY  Toileting Equipment:   Scientist, research (physical sciences) Device(s):   Adaptive device to maintain balance, Grab bar   min verbal cues for sequence of steps    Shower/Bathe Self: 04 - Supervision: Helper provides supervision for safety or  verbal cues ONLY  Location:  Seated at sink  Assistive Device(s):   None   Pt stood at sink for /R/ 14 min washing all areas but her feet and she sat  demoing good safety for that. No LOB but slight  posterior sway all w/ SBA and  Min verbal cues initially to sequencen and use soap but able to demo good  carryover.    Upper Body Dressing:   05 - Setup or Clean Up Assistance: Helper sets up or cleans up prior to or  following an activity  Assistive Device(s):   None    Lower Body Dressing: 03 - Partial/Moderate Assistance: Helper does 1-25% of the  activity  Assistive Device(s):   None   needed Min A to fully remove soiled w/ urine pull up ungergarment but able to  donn  pull up and pants w/ increased time and Min verbal cues for modified tech  w/ demo of understanding    Putting On/Taking Off Footwear: 04 - Supervision: Helper provides supervision  for safety or verbal cues ONLY  Assistive Device(s):   None   increansed time and Min verbal cues for use of compensatory strategies w/ pt  carrying over but able to complete just bending forward w/ extended time    Mobility Functional Assessment    Toilet Transfer: 04 - Supervision: Helper provides supervision for safety or  verbal cues ONLY  Assistive Device(s):   Rolling walker   Min verbal cues for staying inside walker for safety      PLAN  Continued Occupational Therapy is recommended.  Recommended Frequency/Duration/Intensity: 60-120 min/day, 5-6 days/week, for  approx 10-14 days from evaluation on 04/13/2019; 1:1 therapy or group treatment  as appropriate.  Continued Activities Contributing Toward Care Plan: ADL/IADL retraining, ther  ex, ther act, modalities, DME recommendations, d/c planning, equipment, family  education/training, functional mobility/transfer training, energy conservation,  balance training, fall prevention training, functional activity  tolerance  training, NMRE, cognitive retraining, ARMEO, RTI, Bioness    3 Hour Rule Minutes: 60 minutes of OT treatment this session count towards  intensity and duration of therapy requirement. Patient was seen for the full  scheduled time of OT treatment this session.  Therapy Mode Minutes: Individual: 60  minutes.    Signed by: Vinetta Bergamo, OT 04/17/2020 9:00:00 AM

## 2020-04-17 NOTE — Progress Notes (Signed)
MEDICINE PROGRESS NOTE  Sosha Shepherd R. Geoffery Spruce MD    Date Time: 04/17/20 11:34 AM  Patient Name: Yolanda Cox  Attending Physician: Arizona Constable, MD, MD    CC: Medical management    Interval History/24 hour events: Tolerating PT slept better, no new complaints overnight    Patient is an 87 y.o. right hand-dominant female with prior traumatic brain bleed, NPH, HTN, DM, HLD, hypothyroidism, RA, and osteoporosis who presented to the ED from [facility] assisted living after having a syncopal episode and hitting her head. Reportedly patient was altered while in the ambulance and had seizure activity while in the ED. CT head was done and showed no evidence of acute intracranial injury. EEG was done and was reported as normal. MRI brain showed moderate generalized intraparenchymal volume loss with moderately advanced chronic microvascular changes but no acute changes. Neurology was consulted and patient was started on keppra. She was found to have pneumonia, which was thought likely to be secondary to aspiration from seizure activity. She was started initially on IV abx and then transitioned to oral by the time of discharge. Per documentation the remainder of her hospitalization was unremarkable. She was evaluated by PT/OT/SLP and deemed appropriate for acute inpatient rehab and was transferred to the [facility] inpatient rehab unit on 04/11/2020.   Review of Systems:   Review of Systems - Negative except As in HPI      Physical Exam:     Patient Vitals for the past 24 hrs:   BP Temp Temp src Pulse Resp SpO2   04/17/20 1008 135/70 -- -- 66 -- --   04/17/20 0909 125/76 -- -- -- -- --   04/17/20 0851 145/80 -- -- 67 -- --   04/17/20 0805 137/71 -- -- 64 -- --   04/17/20 0552 153/71 98.2 F (36.8 C) Oral 64 18 98 %   04/16/20 1641 125/70 98.1 F (36.7 C) Oral 65 16 96 %   04/16/20 1453 147/69 -- -- 66 -- --   04/16/20 1158 133/69 -- -- 64 -- --       Intake and Output Summary (Last 24 hours) at Date  Time    Intake/Output Summary (Last 24 hours) at 04/17/2020 1134  Last data filed at 04/17/2020 1100  Gross per 24 hour   Intake 400 ml   Output 0 ml   Net 400 ml       General: awake, alert, oriented x 1; no acute distress.  Cardiovascular: regular rate and rhythm, no murmurs, rubs or gallops  Lungs: clear to auscultation bilaterally, without wheezing, rhonchi, or rales  Abdomen: soft, non-tender, non-distended; no palpable masses, no hepatosplenomegaly, normoactive bowel sounds, no rebound or guarding  Extremities: no clubbing, cyanosis, or edema  Other: left side of scalp some tenderness    Meds:     Medications were reviewed:    Labs:     Results     Procedure Component Value Units Date/Time    Glucose Whole Blood - POCT [[id number]]  (Abnormal) Collected: 04/17/20 0744     Updated: 04/17/20 0750     Whole Blood Glucose POCT 148 mg/dL     Glucose Whole Blood - POCT [[id number]]  (Abnormal) Collected: 04/16/20 2048     Updated: 04/16/20 2054     Whole Blood Glucose POCT 169 mg/dL     Glucose Whole Blood - POCT [[id number]]  (Abnormal) Collected: 04/16/20 1639     Updated: 04/16/20 1651     Whole Blood Glucose  POCT 192 mg/dL     Glucose Whole Blood - POCT [[id number]]  (Abnormal) Collected: 04/16/20 1157     Updated: 04/16/20 1202     Whole Blood Glucose POCT 223 mg/dL             Imaging personally reviewed, including:       Assessment:     Patient Active Problem List   Diagnosis    Fall, initial encounter    Seizure after head injury    Debility           Plan:   -Continue Keppra 500 twice daily  -Finish course of Augmentin for aspiration pneumonia  -Continue amlodipine for hypertension  -Blood sugar running high hemoglobin A1c was 7.6, (per daughter blood sugar goes and she is under stress doesn't want any oral hypoglycemic at present we'll monitor  -Was on methotrexate for rheumatoid arthritis as outpatient may resume soon  -On aspirin 325 for DVT prophylaxis        Disposition:   Today's date:  04/17/2020        Signed by: Arizona Constable, MD, MD

## 2020-04-17 NOTE — Rehab Progress Note ([facility]) (Signed)
NAMECONNELLY SPRUELL  MRN: [id number]  Account: [id number]  Session Start: 04/17/2020 10:00:00 AM  Session Stop: 04/17/2020 11:00:00 AM    Total Treatment Minutes: 60.00 Minutes    Physical Therapy  Inpatient Rehabilitation Progress Note - Brief    Rehab Diagnosis: Closed head injury after a fall, Mild anterior wedge  compression fracture of T7  Demographics:            Age: 78Y            Gender: Female  Rehabilitation Precautions/Restrictions:   Fall precaution, skin precaution, seizure precaution, HOH, impaired vision,  delayed mental processing    SUBJECTIVE  Patient Report: Pt states, "I want to get better at walking."  Pain: Patient currently has pain.  Patient reports a pain level of 4 out of 10. No pain intervention was  facilitated because denies pain/discomfort at this time. No pain at beginning of  session. Pt reports 3-4/10 pain in R lateral/distal thigh after first amb. Pain  increases with palpation along IT band. Performed 3-4 mintues trigger point  release and myofascial massage.    OBJECTIVE  Vital Signs:                       Before Activity              After Activity  Vitals  BP Systolic          135                          126  BP Diastolic         70                           71  Pulse                66                           71    Interventions:       Therapeutic Activities:  Gait training- Pt amb with RW 400' x3 bouts plus  an additional 380' that included on/off elevator, CGA. Adjusted RW after 2nd  bout of amb with improved pain. Amb without AD 50' after first 2 bouts of amb  with RW and Pt reports increased pain, minA. Cues throughout gait to increase  gait speed and to increase foot clearance.  - performed initially self selected speed with RW 0.32 and 0.73m/s. With  cues given, Pt amb with gait speeds of 0.46 to 0.52m/s (measured intermittently  throughout session).  Transfer training- Cues throughout for safe hand placement, especially for stand  to sit. Pt performs with RW CGA.  MinA for transfer without AD.  Patient was left seated in chair in his/her room. Handoff to nurse completed.  Chair alarm in place and activated.  Oriented to call bell and placed within reach.  Personal items within reach.  Assistive devices positioned out of reach.  Pain Reassessment:  Response to Pain Intervention: Pt has slight increase in pain with amb which  improved with adjustment of RW.  Post Intervention Pain Quality:  None.  Patient Reports Post Intervention Pain Level of: 0 out of 10  Pain Acceptable: Yes    Education Provided:    Education Provided: Functional transfers. Safety. Gait.       Audience: Patient.  Mode: Explanation.  Demonstration.       Response: Verbalized understanding.  Needs reinforcement.    ASSESSMENT  Pt has decreased foot clearance bilat, but less clearance noted on R side this  session. Pt reports pain along iliotibial band which improves after adjusting RW  to higher height. Pt has increased gait speed with cues throughout session, from  self selected speed of 0.62m/s to 0.71m/s with cues for increasing safe speed. Pt  will continue to benefit from practicing variable speed amb and obstacle  negotiation to prevent falls.    PLAN  Continued Physical Therapy is recommended.  Recommended Frequency/Duration/Intensity: 60-120 minutes, 5-7 days a week for  10-14 days from IE on 04/12/2020.  D/C set for 04/25/2020.  Activities Contributing Toward Care Plan: TherActivities, TherEx, Transfer  training, gait training, static and dynamic balance training in sitting and  standing, cardiovascular endurance training, B LE strength training. Pt will  benefit from skilled care to improve safety, decrease fall risk, and reduce  caregiver burden, [place] planning as part of the interdisciplinary team.    3 Hour Rule Minutes: 60 minutes of PT treatment this session count towards  intensity and duration of therapy requirement. Patient was seen for the full  scheduled time of PT treatment this  session.  Therapy Mode Minutes: Individual: 60 minutes.    Signed by: Trudie Buckler, PT 04/17/2020 11:00:00 AM

## 2020-04-18 LAB — GLUCOSE WHOLE BLOOD - POCT
Whole Blood Glucose POCT: 169 mg/dL — ABNORMAL HIGH (ref 70–100)
Whole Blood Glucose POCT: 200 mg/dL — ABNORMAL HIGH (ref 70–100)
Whole Blood Glucose POCT: 222 mg/dL — ABNORMAL HIGH (ref 70–100)
Whole Blood Glucose POCT: 140 mg/dL — ABNORMAL HIGH (ref 70–100)

## 2020-04-18 NOTE — Rehab Progress Note ([facility]) (Signed)
Yolanda Cox  MRN: [id number]  Account: [id number]  Session Start: 04/18/2020 9:00:00 AM  Session Stop: 04/18/2020 10:00:00 AM    Total Treatment Minutes: 60.00 Minutes    Occupational Therapy  Inpatient Rehabilitation Progress Note - Brief    Rehab Diagnosis: Closed head injury after a fall, Mild anterior wedge  compression fracture of T7  Demographics:            Age: 67Y            Gender: Female  Rehabilitation Precautions/Restrictions:   Fall precaution, skin precaution, seizure precaution, HOH, impaired vision,  delayed mental processing    SUBJECTIVE  Patient Report: When prompted to identify error in transfer pt stated "I forgot  to put my hand back"  Pain: Patient currently without complaints of pain.    OBJECTIVE  Vital Signs:                       Before Activity              After Activity  Vitals  BP Systolic          -                            136  BP Diastolic         -                            77    Interventions:       Therapeutic Activities:  Continued training provided in safety with  negotiating obstacles and narrow spaces with RW for pt?s increased independence  with functional mobility in home. Pt navigating obstacles with max v/c for  positioning of RW for increased safety. Pt improved with use of external cue  (bright Theraband on RW) to perform with min-mod v/c.  Education / training provided in use of walker basket and reacher for safety  with retrieving and carrying items. Pt retrieved x8 objects from floor using  reacher with initial CGA for balance to improve with practice and when cued for  closer proximity to object with SPV. Pt progressed to perform beverage retrieval  from refrigerator and carry it household distance using walker basket and RW  with SPV and v/c for positioning RW to open fridge.  Patient was returned to or left in bed. Handoff to nurse completed.  Bed alarm in place and activated.  Oriented to call bell and placed within reach.  Personal items within  reach.  Telesitter in use.  Pain Reassessment: Pain was not reassessed as no pain was reported.    Education Provided:    Education Provided: Plan of care. Activities of daily living. Functional  transfers. Safety. Stair/curb/environmental barrier negotiation. Equipment.  Safety issues and interventions. Supervision requirements.       Audience: Patient.       Mode: Explanation.  Demonstration.  Teacher, English as a foreign language provided.       Response: Verbalized understanding.  Demonstrated skill.  Needs reinforcement.    ASSESSMENT  Pt continues to require cueing for safety with use of AD when dividing attention  but benefited from use of external cue for improving positioning of RW to body.  Pt progressing to perform retrieval and carrying items with intro AE and RW with  gross SPV with practice. She benefit from continued training in managing RW /  use of  AE in context of light IADLs to increase pt?s independence upon return  ILF.    CARE Tool  Self-Care Functional Assessment  Upper Body Dressing:   04 - Supervision: Helper provides supervision for safety or verbal cues ONLY  Assistive Device(s):   None    Lower Body Dressing: 04 - Supervision: Helper provides supervision for safety or  verbal cues ONLY  Assistive Device(s):   Assistive device for item retrieval/balance   with increased time and cueing to put LE in correct leg of pants      Mobility Functional Assessment  Sit to Lying: 06 - Independent with an assistive device  Assistive Device(s):    HOB slightly elevated    Lying to Sitting on Side of Bed: 04 - Supervision: Helper provides supervision  for safety or verbal cues ONLY  Assistive Device(s):   Bed rail    Sit to Stand: 04 - Supervision: Helper provides supervision for safety or verbal  cues ONLY  Assistive Device(s):   Rolling walker    Chair/Bed-to-Chair Transfer: 04 - Supervision: Helper provides supervision for  safety or verbal cues ONLY  Assistive Device(s):   Rolling walker      PLAN  Continued  Occupational Therapy is recommended.  Recommended Frequency/Duration/Intensity: 60-120 min/day, 5-6 days/week, for  approx 10-14 days from evaluation on 04/13/2019; 1:1 therapy or group treatment  as appropriate.  Continued Activities Contributing Toward Care Plan: ADL/IADL retraining, ther  ex, ther act, modalities, DME recommendations, d/c planning, equipment, family  education/training, functional mobility/transfer training, energy conservation,  balance training, fall prevention training, functional activity tolerance  training, NMRE, cognitive retraining, ARMEO, RTI, [employer]    3 Hour Rule Minutes: 60 minutes of OT treatment this session count towards  intensity and duration of therapy requirement. Patient was seen for the full  scheduled time of OT treatment this session.  Therapy Mode Minutes: Individual: 60 minutes.    Signed by: Margretta Sidle, OTR/L 04/18/2020 10:00:00 AM

## 2020-04-18 NOTE — Rehab Progress Note ([facility]) (Addendum)
Corrected 04/18/2020 5:24:37 AM    NAME: Yolanda Cox  MRN: [id number]  Account: [id number]  Session Start: 04/18/2020 12:00:00 AM  Session Stop: 04/18/2020 12:00:00 AM    Total Treatment Minutes:  Minutes    Rehabilitation Nursing  Inpatient Rehabilitation Shift Assessment    Rehab Diagnosis: Closed head injury after a fall, Mild anterior wedge  compression fracture of T7  Demographics:            Age: 6Y            Gender: Female  Primary Language: English    Date of Onset:  04/09/2020  Date of Admission: 04/11/2020 5:40:00 PM    Rehabilitation Precautions Restrictions:   Fall precaution, skin precaution, seizure precaution, HOH, impaired vision,  delayed mental processing    Patient Report: "I am ok"  Patient/Caregiver Goals:  "to get better"    Wounds/Incisions: See Epic    Medication Review: No clinically significant medication issues identified this  shift.    Bowel and Bladder Output:                       Bladder (# only)             Bowel (# only)  Number of Episodes  Continent            2                            1  Incontinent          0                            0    Additional Education Provided:    Education Provided: Precautions. Plan of care. Safety issues and interventions.  Fall protocol. Supervision requirements. Skin/wound care. Critical pressure  areas. Prevention of skin breakdown. Medication. Name and dosage. Purpose. Side  Effects.       Audience: Patient.       Mode: Explanation.       Response: Needs reinforcement.    TEAM CARE PLAN  Identified problems from team documentation:  Problem: Impaired Cognition  Cognition: Primary Team Goal: Pt will answer orientation and biographical  questions with >90% accuracy given min cuing in order to improve memory./    Problem: Impaired Leisure Skills  Leisure Skills: Primary Team Goal: Pt and caregiver will describe opportunities  and resources for social engagement in the community./    Problem: Impaired Mobility  Mobility: Primary Team Goal: Pt will  correctly demonstrate safe sit<>stand  transfers and ambulation using FWW under direct SUPV or touch assist via  clinical team./Active    Problem: Impaired Self-care Mgmt/ADL/IADL  Self Care: Primary Team Goal: Pt will complete toilet transfers and all  associated tasks with supervision using AD as indicated to ensure safety./    Problem: Safety Risk and Restraint  Safety: Primary Team Goal: Patient will recognized limitations and call for  assistance 100% of the time before getting out of bed./Active    Please review Integrated Patient View Care Plan Flowsheet for Team identified  Problems, Interventions, and Goals.    Signed by: Elby Beck, RN 04/18/2020 1:55:00 AM

## 2020-04-18 NOTE — Rehab Progress Note ([facility]) (Signed)
NAMECIARA Cox  MRN: [id number]  Account: [id number]  Session Start: 04/18/2020 1:00:00 PM  Session Stop: 04/18/2020 2:00:00 PM    Total Treatment Minutes: 60.00 Minutes    Speech Language Pathology  Inpatient Rehabilitation Progress Note - Brief    Rehab Diagnosis: Closed head injury after a fall, Mild anterior wedge  compression fracture of T7  Demographics:            Age: 69Y            Gender: Female  Rehabilitation Precautions/Restrictions:   Fall precaution, skin precaution, seizure precaution, HOH, impaired vision,  delayed mental processing    SUBJECTIVE  Patient Report: "That was so helpful." In regards to phone activity  Pain: Patient currently without complaints of pain.    OBJECTIVE    Interventions:       Speech Treatment: SLP guided pt through steps for utilizing smart phone  (iphone) in order to support memory/sequencing and increase independence in  using her phone to call family/friends and in case of emergency. Pt required  direct teaching and max cuing for successful implementation of steps outlined.  Following mass repetition, support faded to min-mod when attempting to make  phone call. Remainder of session targeting memory through review of orientation  concepts and details of daily schedule. Given access to external aids (e.g.  calendar), pt able to independently locate month/day; however needed min cuing  for year, initially stating it is 2011. Additionally, when reviewing daily  schedule, pt needed max cues for recall of sessions completed in AM.    Patient was returned to or left in bed. Handoff to nurse completed.  Bed alarm in place and activated.  Oriented to call bell and placed within reach.  Personal items within reach.  Assistive devices positioned out of reach.  Bed placed in lowest position.  Pain Reassessment: Pain was not reassessed as no pain was reported.    Education Provided:    Education Provided: Plan of care. Cognitive functioning. Compensatory  cognitive  strategies/aids       Audience: Patient.       Mode: Explanation.  Demonstration.  Teacher, English as a foreign language provided.       Response: Verbalized understanding.  Demonstrated skill.  Needs practice.    ASSESSMENT  Pt greatly benefits from mass repetition of newly learned material.  Additionally, continues to require use of external aids when recalling  orientation/daily schedule. For future sessions, SLP recommends aids be placed  in direct line of sight for best use.    PLAN  Continued Speech Language Pathology is recommended to address:  Recommended Frequency/Duration/Intensity: 60-120 minutes 1-2x day, 5-6x week,  for 10-14 days  Continued Activities Contributing Toward Care Plan: dynamic assessment,  neuro-cognitive retraining, memory strategies, finance/medication management  exercises, external aids, brain injury education, family training, and d/c  planning    3 Hour Rule Minutes: 60 minutes of SLP treatment this session count towards  intensity and duration of therapy requirement. Patient was seen for the full  scheduled time of SLP treatment this session.  Therapy Mode Minutes: Individual: 60 minutes.    Signed by: Mikael Spray, CCC/SLP 04/18/2020 2:00:00 PM

## 2020-04-18 NOTE — Rehab Progress Note ([facility]) (Signed)
Yolanda Cox  MRN: [id number]  Account: [id number]  Session Start: 04/18/2020 11:00:00 AM  Session Stop: 04/18/2020 12:00:00 PM    Total Treatment Minutes: 60.00 Minutes    Physical Therapy  Inpatient Rehabilitation Progress Note - Brief    Rehab Diagnosis: Closed head injury after a fall, Mild anterior wedge  compression fracture of T7  Demographics:            Age: 89Y            Gender: Female  Rehabilitation Precautions/Restrictions:   Fall precaution, skin precaution, seizure precaution, HOH, impaired vision,  delayed mental processing    SUBJECTIVE  Patient Report: "I want to walk so I can go home"  Pain: Patient currently without complaints of pain.    OBJECTIVE  Interventions:       Therapeutic Activities:  RN handoff cleared pt for therapy. Pt encountered  semi-supine in bed and agreeable for PT session.    GAIT TRAINING: 300 ft at [facility] with supervision; able to maintain COM within frame  of RW. With increased distance, VCs for increased foot clearance d/t intermit  shuffling pattern  Progressed to weaving in/out of cones, and item retrieval 100 ft x2 in prep for  navigating home environment. Noted shortened step length and anterior  displacement of RW during obstacle negotiation, requiring VCs for AD management.      FUNCTIONAL MOBILITY: gait 20 ft at RW to bathroom for continent bladder episode,  performing functional standing balance for clothing management and hygiene with  gross supervision for safety  Patient was returned to or left in bed. Handoff to nurse completed.  Bed alarm in place and activated.  Oriented to call bell and placed within reach.  Personal items within reach.  Assistive devices positioned out of reach.  Telesitter in use.  Pain Reassessment: Pain was not reassessed as no pain was reported.    ASSESSMENT  Pt with functional gains progressing to supervision for gait at RW. Responding  well to visual cues at [facility] requiring decreased PT assist/cues during structured  tasks and  transfers. Difficulty generalizing AD management into dynamic tasks,  including turn negotiation and dual tasks, in prep for navigating home  environment. To benefit from cont functional training per POC.    CARE Tool  Mobility Functional Assessment  Sit to Stand: 04 - Supervision: Helper provides supervision for safety or verbal  cues ONLY  Assistive Device(s):   Rolling walker    Chair/Bed-to-Chair Transfer: 04 - Supervision: Helper provides supervision for  safety or verbal cues ONLY  Assistive Device(s):   Rolling walker    Walk 10 Feet: 04 - Supervision: Helper provides supervision for safety or verbal  cues ONLY   at RW    Walk 50 Feet with Two Turns: 04 - Supervision: Helper provides supervision for  safety or verbal cues ONLY   at RW    Walk 150 Feet:  04 - Supervision: Helper provides supervision for safety or  verbal cues ONLY   at RW    PLAN  Continued Physical Therapy is recommended.  Recommended Frequency/Duration/Intensity: 60-120 minutes, 5-7 days a week for  10-14 days from IE on 04/12/2020.  D/C set for 04/25/2020.  Activities Contributing Toward Care Plan: TherActivities, TherEx, Transfer  training, gait training, static and dynamic balance training in sitting and  standing, cardiovascular endurance training, B LE strength training. Pt will  benefit from skilled care to improve safety, decrease fall risk, and reduce  caregiver burden, [place]  planning as part of the interdisciplinary team.    3 Hour Rule Minutes: 60 minutes of PT treatment this session count towards  intensity and duration of therapy requirement. Patient was seen for the full  scheduled time of PT treatment this session.  Therapy Mode Minutes: Individual: 60 minutes.    Signed by: Wetzel Bjornstad, PT 04/18/2020 12:00:00 PM

## 2020-04-18 NOTE — Progress Notes (Addendum)
Home Health Referral          Referral from Theodis Sato (Case Manager) for home health care upon discharge.    By [employer], the patient has the right to freely choose a home care provider.  Arrangements have been made with:     A company of the patients choosing. We have supplied the patient with a listing of providers in your area who asked to be included and participate in Medicare.   The preferred provider of your insurance company. Choosing a home care provider other than your insurance company's preferred provider may affect your insurance coverage.        Home Health Discharge Information     Your doctor has ordered Skilled Nursing, Physical Therapy, Occupational Therapy and Speech and Language Therapy in-home service(s) for you while you recuperate at home, to assist you in the transition from hospital to home.      The agency that you or your representative chose to provide the service:  Name of Home Health Agency Placement: Other (comment box) [facility]) [phone]      Home health services were set up by:  Dustin Flock, RN  (Post Acute Care Coordinator for home health)   Phone [phone]      Additional comments:    1/18 Referral and clinical notes faxed to [facility] REFERRAL    PATIENT DEMOGRAPHICS:        Name: Yolanda Cox    Discharge Address: [address]  [address]  [place] [address]      Primary Telephone Number:  [phone] (Daughter, [name])  Secondary Telephone Number: n/a  Emergency Contact and Number: Extended Emergency Contact Information  Primary Emergency Contact: sullender,jill  Mobile Phone: [phone]  Relation: Daughter        Ordering Physician: Gillis Santa, MD    PCP: Shelbie Proctor, MD, [phone]    Following Physician: Shelbie Proctor, MD, [phone]  Agreeable to Follow: Yes    Language/Communication Barrier:  No    Primary Diagnosis and Reason for Services:    Debility R53.81   Fall, initial encounter W19.XXXA    Seizure after head injury R56.1          [employer] (Labs, Wounds, Infusions, etc.): none      Additional Comments:      COVID-19 Screening: tested 04/09/20    COVID-19      negative        Discharge Date: 1/25  Referral Source (PACC/Hospital/Unit): Dustin Flock, RN  Referral Date: 04/18/20      Home Health face-to-face (FTF) Encounter (Order [id number])  Consult  Date: 04/18/2020 Department: [facility] - A Service of [facility] Ordering/Authorizing: Charlestine Massed, MD       Order Information    Order Date/Time Release Date/Time Start Date/Time End Date/Time   04/18/20 11:41 AM None 04/18/20 11:21 AM 04/18/20 11:21 AM     Order Details    Frequency Duration Priority Order Class   Once 1 occurrence Routine Hospital Performed     Standing Order Information    Remaining Occurrences Interval Last Released     0/1 Once 04/18/2020             Provider Information    Ordering User Ordering Provider Authorizing Provider   Dustin Flock, RN Charlestine Massed, MD Charlestine Massed, MD   Attending Provider(s) Admitting Provider PCP   Charlestine Massed, MD  Charlestine Massed, MD Ro, Saunders Glance, MD     Verbal Order Info    Action Created on Order Mode Entered by Responsible Provider Signed by Signed on   Ordering 04/18/20 1141 Telephone with Selinda Orion, RN Charlestine Massed, MD Charlestine Massed, MD 04/18/20 1143     Comments    Physicians to follow in community: Shelbie Proctor, MD (PCP)     Home nursing required for skilled assessment including cardiopulmonary assessment and dietary education for disease management, and medication instruction. Home PT/OT required for gait and balance training, strengthening, mobility, fall prevention, and ADL training. Home SLP required for assessment and treatment of speech and swallowing issues.     Diagnoses:     Debility R53.81   Fall, initial encounter W19.XXXA   Seizure after head injury R56.1           Home Health face-to-face (FTF) Encounter:  Patient Communication    Not Released Not seen     Order Questions    Question Answer   Date I saw the patient face-to-face: 04/18/2020   Evidence this patient is homebound because: B. Profound weakness, poor balance/unsteady gait d/t illness/treatment/procedure    C. Decreased endurance, strength, ROM, cadence, safety/judgment during mobility    E. Requires assistance of another person or assistive device to ambulate > 5 feet    G. Fall risk due to impaired coordination, gait and decreased balance    I. Restricted to home to decrease risk of infection    H. Unsafe to leave home unsupervised due to mental status    J. Advanced age with frailty factors affecting safe ambulation & needs supervision    N. Impaired mobility d/t pain, arthritis, weakness that compromises patient safety    L. Transfer/ambulation requires stand by assist and verbal cues to perform safely    M. Unsteady gait, poor ambulation with history of falls   Medical conditions that necessitate Home Health care: B. Functional impairment due to recent hospitalization/procedure/treatment    C. Risk for complication/infection/pain requiring follow up and monitoring    F. New diagnosis & treatment requiring follow up monitoring and management    H. Multiple new medications requiring management and monitoring   Per clinical findings, following services are medically necessary: Skilled Nursing    PT    OT    Speech Language Pathology   Clinical findings that support the need for Skilled Nursing. SN will: C. Monitor for signs and symptoms of exacerbation of disease and management    D. Review medication reconciliation, manage and educate on use and side effects    G. Educate on new diagnosis, treatment & management to prevent re-hospitalization    H. Assess cardiopulmonary status and monitor for signs &symptoms of exacerbation    I. Educate dietary and or fluid restrictions and weight management   Clinical findings that support the need for  Physical Therapy. PT will A. Evaluate and treat functional impairment and improve mobility    C. Educate on weight bearing status, stair/gait training, balance & coordination    D. Provide services to help restore function, mobility, and releive pain    E. Educate on functional mobility; bed, chair, sit, stand and transfer activities    F. Perform home safety assessment & develop safe in home exercise program    G. Implement activities to improve stance time, cadence & step length    H. Educate on the safe use of assistive device/ durable medical equipment  I. Instruct on restorative activities to restore ability to perform ADL   OT will provide assistance with: Home program to improve ability to perform ADLs    Recovery and maintenance skills    Basic motor function and reasoning abilities    Strategies to compensate for loss of function    Restorative program to improve mobility and independence   Clinical findings that support the need for SLP. ST will E. Educate on articulation disorders and augmentative communication    D. Educate on alterations and compensation strategies due to aphasia & dysphasia    C. Educate on communication deficit & develop communication plan based on deficits    B. Educate on swallowing deficit, aspiration precautions and dietary recommendatio    A. Evaluate & treat speech, language, cognitive, communication & swallowing impair           Process Instructions    Please select Home Care Services medically necessary.     Based on the above findings, I certify that this patient is confined to the home and needs intermittent skilled nursing care, physical therapry and / or speech therapy or continues to need occupational therapy. The patient is under my care, and I have initiated the establishment of the plan of care. This patient will be followed by a physician who will periodically review the plan of care.      Collection Information        Consult Order Info    ID  Description Priority Start Date Start Time   [id number] Home Health face-to-face (FTF) Encounter Routine 04/18/2020 11:21 AM   Provider Specialty Referred to   ______________________________________ _____________________________________       Acknowledgement Info    For At Acknowledged By Acknowledged On   Placing Order 04/18/20 1141 Judie Petit, RN 04/18/20 1217     Verbal Order Info    Action Created on Order Mode Entered by Responsible Provider Signed by Signed on   Ordering 04/18/20 1141 Telephone with Mauricio Po, Corene Cornea, RN Charlestine Massed, MD Charlestine Massed, MD 04/18/20 1143     Patient Information    Patient Name   [name] Legal Sex   Female DOB   19-Jun-1929     Additional Information    Associated Reports External References   Priority and Order Details [employer]          [facility]  [facility]        Patient Name: JENISSE, VULLO     MRN: [id number]     CSN: [id number]       Account Information    Hosp Acct #   [id number] Patient Class   Inpt Rehab Service  Rehabilitation Head Accommodation Code  Semi-Private     Admission Information    Admitting Physician:  Attending Physician: Charlestine Massed, MD  Charlestine Massed, MD Unit  [facility] L&D Status     Admitting Diagnosis: Brain injury; Debility Room / Bed  R303/R303.01 L&D - Last Menstrual Cycle     Chief Complaint:      Admit Type:  Admit Date/Time:  Discharge Date/Time: Elective  04/11/2020 / 1740   /  Length of Stay: 7 Days   L&D EDD   Estimated Date of Delivery: None noted.     Patient Information            Home Address: [address]  [address]  [place] [address] Employer:  Employer Address:     ,  Main Phone: [phone] Employer Phone:    SSN: [id number]     DOB: 06/28/1929 (89 yrs)     Sex: Female Primary Care Physician: Shelbie Proctor, MD   Marital Status: Widowed Referring Physician:       Charlestine Massed, MD   Race: White or Caucasian     Ethnicity: Non  Hispanic/Latino     Emergency Contacts  Name Home Phone Work Phone Mobile Phone Relationship Dewitt Hoes   [phone] Daughter         Guarantor Information    Guarantor Name: RANEEN, JAFFER Guarantor ID: [id number]   Guarantor Relationship to Pt: Self Guarantor Type: Personal/Family   Guarantor DOB:   1930-03-28     Guarantor Address: [address]  [place], [place] [address]       Guarantor Home Phone: [phone] Guarantor Employer:        Guarantor Work Phone:  Pensions consultant Emp Phone:               Chief Technology Officer Name: [employer] PART A AND B Subscriber Name: [employer]   Insurance Address:   [address]  [place], [place] [address] Subscriber DOB: 10/28/1929     Subscriber ID: [id number]   Insurance Phone:  Pt Relationship to Sub:   Self   Insurance ID:      Group Name:  Preauthorization #: NPR   Group #:  Preauthorization Days:      Art gallery manager Name: [employer] AARP MEDICARE SUPP 3* Subscriber Name: [employer]   Insurance Address:   [address]  [place], [place] [address] Subscriber DOB: Jun 26, 1929     Subscriber ID: [id number]   Insurance Phone: [phone] Pt Relationship to Sub:   Self   Insurance ID:      Group Name:  Preauthorization #:    Group #:  Preauthorization Days:      [employer] Name: - Subscriber Name:    Community education officer Address:     ,   Statistician DOB:      Subscriber ID:    Press photographer:  Pt Relationship to Sub:      Insurance ID:      Group Name:  Preauthorization #:    Group #:  Preauthorization Days:        04/18/2020 2:34 PM         Signed by: Dustin Flock, RN  Date Time: 04/18/20 2:30 PM

## 2020-04-18 NOTE — Progress Notes (Signed)
TC to patient's daughter, [name]. Introduced [facility] and role. Explained Home Care and services recommended, SN, PT, OT, SLP. Daughter agreeable to setting up [facility] services. States no preference for agency. Verified DOB, address, contact, insurance, PCP information. Patient's daughter had no further question at end of visit.    Dustin Flock [facility] RN  [phone]

## 2020-04-18 NOTE — Discharge Instr - AVS First Page (Addendum)
Reason for your Hospital Admission:  Seizure episodes      Instructions for after your discharge:  Please continue to follow closely with your primary care physician and neurology.    Home Health Discharge Information      Your doctor has ordered Skilled Nursing, Physical Therapy, Occupational Therapy and Speech and Language Therapy in-home service(s) for you while you recuperate at home, to assist you in the transition from hospital to home.        The agency that you or your representative chose to provide the service:  Name of Home Health Agency Placement: [facility] [phone]     IF YOU HAVE NOT HEARD FROM YOUR HOME YOUR HOME HEALTH AGENCY WITHIN 24-48 HOURS AFTER DISCHARGE PLEASE CALL YOUR AGENCY TO ARRANGE A TIME FOR YOUR FIRST VISIT. FOR ANY SCHEDULING CONCERNS OR QUESTIONS RELATED TO HOME HEALTH, SUCH AS TIME OR DATE PLEASE CONTACT YOUR HOME HEALTH AGENCY AT THE NUMBER LISTED ABOVE.      Home health services were set up by:  Dustin Flock, RN  (Post Acute Care Coordinator for home health)   Phone [phone]

## 2020-04-18 NOTE — Rehab Progress Note ([employer]) (Signed)
Yolanda Cox  MRN: [id number]  Account: [id number]  Session Start: 04/18/2020 12:00:00 AM  Session Stop: 04/18/2020 12:00:00 AM    Total Treatment Minutes:  Minutes    Rehabilitation Nursing  Inpatient Rehabilitation Shift Assessment    Rehab Diagnosis: Closed head injury after a fall, Mild anterior wedge  compression fracture of T7  Demographics:            Age: 71Y            Gender: Female  Primary Language: English    Date of Onset:  04/09/2020  Date of Admission: 04/11/2020 5:40:00 PM    Rehabilitation Precautions Restrictions:   Fall precaution, skin precaution, seizure precaution, HOH, impaired vision,  delayed mental processing    Patient Report: " I am ok "  Patient/Caregiver Goals:  "to get better"    Wounds/Incisions: See Epic    Medication Review: No clinically significant medication issues identified this  shift.    Bowel and Bladder Output:                       Bladder (# only)             Bowel (# only)  Number of Episodes  Continent            4                            1  Incontinent          0                            0    Additional Education Provided:    Education Provided: Precautions. Pain management. Pain scale. Medication  options. Side effects. Plan of care. Safety issues and interventions. Fall  protocol. Supervision requirements. Use of adaptive devices. Skin/wound care.  Prevention of skin breakdown. Safety. Medication. Name and dosage.  Administration. Purpose. Diabetes. Hypoglycemia causes/symptoms/treatment.  Hyperglycemia causes/symptoms/treatment.       Audience: Patient.       Mode: Explanation.       Response: Verbalized understanding.    TEAM CARE PLAN  Identified problems from team documentation:  Problem: Impaired Cognition  Cognition: Primary Team Goal: Pt will answer orientation and biographical  questions with >90% accuracy given min cuing in order to improve memory./    Problem: Impaired Leisure Skills  Leisure Skills: Primary Team Goal: Pt and caregiver will  describe opportunities  and resources for social engagement in the community./    Problem: Impaired Mobility  Mobility: Primary Team Goal: Pt will correctly demonstrate safe sit<>stand  transfers and ambulation using FWW under direct SUPV or touch assist via  clinical team./Active    Problem: Impaired Self-care Mgmt/ADL/IADL  Self Care: Primary Team Goal: Pt will complete toilet transfers and all  associated tasks with supervision using AD as indicated to ensure safety./    Problem: Safety Risk and Restraint  Safety: Primary Team Goal: Patient will recognized limitations and call for  assistance 100% of the time before getting out of bed./Active    Please review Integrated Patient View Care Plan Flowsheet for Team identified  Problems, Interventions, and Goals.    Signed by: Judie Petit, RN 04/18/2020 6:25:00 PM

## 2020-04-18 NOTE — Progress Notes (Signed)
MEDICINE PROGRESS NOTE  Joei Frangos R. Geoffery Spruce MD    Date Time: 04/18/20 12:23 PM  Patient Name: Yolanda Cox  Attending Physician: Arizona Constable, MD, MD    CC: Medical management    Interval History/24 hour events: Tolerating PT slept better, no new complaints overnight    Patient is an 87 y.o. right hand-dominant female with prior traumatic brain bleed, NPH, HTN, DM, HLD, hypothyroidism, RA, and osteoporosis who presented to the ED from [facility] assisted living after having a syncopal episode and hitting her head. Reportedly patient was altered while in the ambulance and had seizure activity while in the ED. CT head was done and showed no evidence of acute intracranial injury. EEG was done and was reported as normal. MRI brain showed moderate generalized intraparenchymal volume loss with moderately advanced chronic microvascular changes but no acute changes. Neurology was consulted and patient was started on keppra. She was found to have pneumonia, which was thought likely to be secondary to aspiration from seizure activity. She was started initially on IV abx and then transitioned to oral by the time of discharge. Per documentation the remainder of her hospitalization was unremarkable. She was evaluated by PT/OT/SLP and deemed appropriate for acute inpatient rehab and was transferred to the [facility] inpatient rehab unit on 04/11/2020.   Review of Systems:   Review of Systems - Negative except As in HPI      Physical Exam:     Patient Vitals for the past 24 hrs:   BP Temp Temp src Pulse Resp SpO2   04/18/20 0823 146/65 -- -- 70 -- 96 %   04/18/20 0429 133/67 98.2 F (36.8 C) Oral 64 17 97 %   04/17/20 1623 128/66 98.2 F (36.8 C) Oral 61 16 95 %       Intake and Output Summary (Last 24 hours) at Date Time    Intake/Output Summary (Last 24 hours) at 04/18/2020 1223  Last data filed at 04/18/2020 0400  Gross per 24 hour   Intake 200 ml   Output 0 ml   Net 200 ml       General: awake, alert, oriented x 1;  no acute distress.  Cardiovascular: regular rate and rhythm, no murmurs, rubs or gallops  Lungs: clear to auscultation bilaterally, without wheezing, rhonchi, or rales  Abdomen: soft, non-tender, non-distended; no palpable masses, no hepatosplenomegaly, normoactive bowel sounds, no rebound or guarding  Extremities: no clubbing, cyanosis, or edema  Other: left side of scalp some tenderness    Meds:     Medications were reviewed:    Labs:     Results     Procedure Component Value Units Date/Time    Glucose Whole Blood - POCT [[id number]]  (Abnormal) Collected: 04/18/20 1206     Updated: 04/18/20 1211     Whole Blood Glucose POCT 200 mg/dL     Glucose Whole Blood - POCT [[id number]]  (Abnormal) Collected: 04/18/20 0756     Updated: 04/18/20 0803     Whole Blood Glucose POCT 169 mg/dL     Glucose Whole Blood - POCT [[id number]]  (Abnormal) Collected: 04/17/20 2053     Updated: 04/17/20 2102     Whole Blood Glucose POCT 179 mg/dL     Glucose Whole Blood - POCT [[id number]]  (Abnormal) Collected: 04/17/20 1621     Updated: 04/17/20 1633     Whole Blood Glucose POCT 232 mg/dL             Imaging personally  reviewed, including:       Assessment:     Patient Active Problem List   Diagnosis    Fall, initial encounter    Seizure after head injury    Debility           Plan:   -Continue Keppra 500 twice daily  -Continue amlodipine for hypertension  -Blood sugar running high hemoglobin A1c was 7.6, (per daughter blood sugar goes and she is under stress doesn't want any oral hypoglycemic at present we'll monitor  -Was on methotrexate for rheumatoid arthritis as outpatient may resume soon  -On aspirin 325 for DVT prophylaxis        Disposition:   Today's date: 04/18/2020        Signed by: Arizona Constable, MD, MD

## 2020-04-18 NOTE — Progress Notes (Signed)
PHYSICAL MEDICINE AND REHABILITATION  PROGRESS NOTE -- FACE-TO-FACE ENCOUNTER    Date Time: 04/18/20 11:51 AM  Patient Name: Yolanda Cox, Yolanda Cox    Admission date:  04/11/2020    Subjective:     No acute events overnight. Patient feels well today without current complaints. Had a good morning of therapy. Denies pain.      Functional Status:     PT:  Pt has decreased foot clearance bilat, but less clearance noted on R side this  session. Pt reports pain along iliotibial band which improves after adjusting RW  to higher height. Pt has increased gait speed with cues throughout session, from  self selected speed of 0.22m/s to 0.74m/s with cues for increasing safe speed. Pt  will continue to benefit from practicing variable speed amb and obstacle  negotiation to prevent falls.    OT:  Pt. slightly lethargic upon start of session and requested washing up at sink  vs. shower this AM d/t pts. regular schedule is sleeping much later. Pt. was  able to stand for /R/15 min. w/o UE support most of the time w/ some posterior  sway but no LOB and needing SBA only but did demo good saftety by sitting to  wash B feet to prevent falls. Pt. contiues to require Min cues to sequence steps  for transitioning stand->sit as well as safe use of FWW during mobility and  further training is needed.      SLP:  Pt unable to utilize memory log independently at this time, therefore will  likely need support in using external aids upon return home. Additionally,  orientation to temporal concepts remains variable, likely dependent upon  fatigue/time of day. Continue POC as stated.    Medications:   Medication reviewed by me:     Scheduled Meds: PRN Meds:   amLODIPine, 10 mg, Oral, Daily  aspirin, 325 mg, Oral, Daily  atorvastatin, 40 mg, Oral, QHS  calcium citrate-vitamin D, 2 tablet, Oral, Daily  folic acid, 1 mg, Oral, Daily  insulin lispro, 1-3 Units, Subcutaneous, QHS  insulin lispro, 1-5 Units, Subcutaneous, TID  AC  lactobacillus/streptococcus, 1 capsule, Oral, Daily  levETIRAcetam, 500 mg, Oral, Q12H [facility]  levothyroxine, 75 mcg, Oral, Daily at 0600  multivitamin, 1 tablet, Oral, Daily  sertraline, 50 mg, Oral, Daily  vitamin D, 25 mcg, Oral, Daily        Continuous Infusions:   acetaminophen, 650 mg, Q6H PRN  albuterol-ipratropium, 3 mL, Q6H PRN  butalbital-acetaminophen-caffeine, 1 tablet, Q4H PRN  dextrose, 15 g of glucose, PRN   And  dextrose, 12.5 g, PRN   And  glucagon (rDNA), 1 mg, PRN  magnesium hydroxide, 30 mL, Daily PRN  melatonin, 3 mg, QHS PRN  ondansetron, 4 mg, Q8H PRN  senna-docusate, 2 tablet, QHS PRN          Medication Review  1. A complete drug regimen review was completed: Yes  2. Were any drug issues found during review?: No      Review of Systems:   A comprehensive review of systems was: No fevers, chills, nausea, vomiting, chest pain, shortness of breath, cough, headache, double vision.  All others negative.    Physical Exam:     Vitals:    04/17/20 1008 04/17/20 1623 04/18/20 0429 04/18/20 0823   BP: 135/70 128/66 133/67 146/65   Pulse: 66 61 64 70   Resp:  16 17    Temp:  98.2 F (36.8 C) 98.2 F (36.8 C)  TempSrc:  Oral Oral    SpO2:  95% 97% 96%   Weight:       Height:           Intake and Output Summary (Last 24 hours) at Date Time    Intake/Output Summary (Last 24 hours) at 04/18/2020 1151  Last data filed at 04/18/2020 0400  Gross per 24 hour   Intake 200 ml   Output 0 ml   Net 200 ml     P.O.: 200 mL (04/17/20 2146)     Urine: 0 mL (04/18/20 0400)           General: WN/WD, in NAD, seen lying in bed  HEENT: NC/AT, moist mucosal membranes, anicteric sclera  Cardiac: regular rate, extremities warm and well perfused  Resp: non-labored breathing, acyanotic  Abdomen: soft, non-tender, non-distended  Neuro: Awake and alert, oriented to person and place, knows the month and year but not day, speech fluent and spontaneous without dysarthria but is slowed, mild proximal weakness but moves extremities  with AG strength throughout    Labs:   No results for input(s): GLUCOSEWHOLE in the last 24 hours.    Recent Labs   Lab 04/12/20  0526   WBC 8.42   Hgb 10.5*   Hematocrit 32.1*   Platelets 177        Recent Labs   Lab 04/12/20  0526   Sodium 140   Potassium 3.3*   Chloride 106   CO2 26   BUN 11.0   Creatinine 0.7   Calcium 9.4   Glucose 164*             Results     Procedure Component Value Units Date/Time    Glucose Whole Blood - POCT [[id number]]  (Abnormal) Collected: 04/18/20 0756     Updated: 04/18/20 0803     Whole Blood Glucose POCT 169 mg/dL     Glucose Whole Blood - POCT [[id number]]  (Abnormal) Collected: 04/17/20 2053     Updated: 04/17/20 2102     Whole Blood Glucose POCT 179 mg/dL     Glucose Whole Blood - POCT [[id number]]  (Abnormal) Collected: 04/17/20 1621     Updated: 04/17/20 1633     Whole Blood Glucose POCT 232 mg/dL     Glucose Whole Blood - POCT [[id number]]  (Abnormal) Collected: 04/17/20 1149     Updated: 04/17/20 1156     Whole Blood Glucose POCT 229 mg/dL                Rads:   Radiological Procedure reviewed.  Radiology Results (24 Hour)     ** No results found for the last 24 hours. **              Assessment and Plan:     87yo F with prior traumatic brain bleed, NPH, HTN, DM, HLD, hypothyroidism, RA, and osteoporosis presented after syncopal event and was found to have aspiration pneumonia thought to be secondary to seizures    #Rehab  - continue PT/OT/SLP  - 1/13 patient was discussed this morning in team conference, see medilinks notes for details    #New onset seizures  - patient with syncopal episode in assisted living facility and then seizure activity while in ED  - spot EEG negative  - CT head and MRI brain negative for acute intracranial process  - seen by neurology, Dr. Marney Doctor while in acute hospital  - seizure precautions  - continue with keppra 500mg  BID    #  Hx of traumatic brain bleed/NPH  - CT and MRI showing chronic changes only; without evidence of hydrocephalus  - does have  some cognitive impairments that may be secondary to this prior injury  - SLP will be working with patient on cognition    #Aspiration pneumonia  - thought to be secondary to seizure activity  - was on zosyn while in acute hospital and transitioned to augmentin to complete 5 day course on admission to rehab, stop date 1/16  - probiotic, incentive spirometer, duonebs PRN  - will need repeat CXR in 4-6 weeks for resolution  - 1/17 patient has completed abx, no current signs/symptoms of infection    #HTN  - amlodipine 10mg   - 1/18 BP at goal    #DM  - HbA1c 7.6  - per daughter the patient's blood sugar elevates when she is stressed and then it returns to normal, decision was made to hold off on starting an oral agent  - continue SSI  - 1/17 blood sugar remains elevated but daughter prefers to not have patient on standing medications    #HLD  - atorvastatin 40mg     #Hypothyroidism  - levothyroxine    #Anemia  - likely secondary to chronic disease  - Hb 10.5 on admission, continue to monitor with intermittent cbc    #T7 compression fracture  - age indeterminate with no acute findings on exam, continue to monitor    #Depression  - zoloft 50mg     #Rheumatoid arthritis  - on methotrexate qweekly, will confirm date and dose, likely start after abx completed    #Osteoporosis  - citracal-vitamin D 2 tab daily    #DVT ppx  - ASA 325    #Dispo  EDD 1/25    Patient Active Problem List   Diagnosis    Fall, initial encounter    Seizure after head injury    Debility       Continue comprehensive and intensive inpatient rehab program, including:   Physical therapy 60-120 min daily, 5-6 times per week, Occupational therapy 60-120 min daily, 5-6 times per week, speech therapy 60-120 min daily, 5-6 times per week, Case management and Rehabilitation nursing    Signed by: Charlestine Massed, MD MD    [facility]    If there are questions or concerns about the content of this note or  information contained within the body of this dictation they should be addressed directly with the author for clarification.

## 2020-04-18 NOTE — Rehab Progress Note (Medilinks) (Signed)
NAMEWAYNESHA RAMMEL  MRN: [id number]  Account: [id number]  Session Start: 04/17/2020 1:00:00 PM  Session Stop: 04/17/2020 2:00:00 PM    Total Treatment Minutes: 60.00 Minutes    Speech Language Pathology  Inpatient Rehabilitation Progress Note - Brief    Rehab Diagnosis: Closed head injury after a fall, Mild anterior wedge  compression fracture of T7  Demographics:            Age: 24Y            Gender: Female  Rehabilitation Precautions/Restrictions:   Fall precaution, skin precaution, seizure precaution, HOH, impaired vision,  delayed mental processing    SUBJECTIVE  Patient Report: "It's February 2021."  Pain: Patient currently without complaints of pain.    OBJECTIVE    Interventions:       Speech Treatment: SLP facilitated review of orientation concepts and  details pertaining to daily schedule using the pt's specific memory notebook in  order to improve memory and carry over of new information. Pt disoriented to  date + year, stating it is February 2021. Given max cuing and use of calendar,  pt was able to locate month, date, and year. Pt was able to retain this  information for /R/1-minute and continuously needed cuing to access calendar  throughout session. When discussing daily schedule, pt was able to recall that  she had "OT" and "PT" earlier today; however needed min cuing to recall details  of those sessions (e.g. walking, stairs, sponge bath). SLP assisted pt in  recording these details within the daily log portion of her memory notebook.     Pt left in bathroom with RN present to finish toileting needs. Handoff to nurse  completed.  Pain Reassessment: Pain was not reassessed as no pain was reported.    Education Provided:    Education Provided: Cognitive functioning. Compensatory cognitive  strategies/aids       Audience: Patient.       Mode: Explanation.  Demonstration.  Teacher, English as a foreign language provided.       Response: Verbalized understanding.  Needs practice.  Needs reinforcement.    ASSESSMENT  Pt unable  to utilize memory log independently at this time, therefore will  likely need support in using external aids upon return home. Additionally,  orientation to temporal concepts remains variable, likely dependent upon  fatigue/time of day. Continue POC as stated.    PLAN  Continued Speech Language Pathology is recommended to address:  Recommended Frequency/Duration/Intensity: 60-120 minutes 1-2x day, 5-6x week,  for 10-14 days  Continued Activities Contributing Toward Care Plan: dynamic assessment,  neuro-cognitive retraining, memory strategies, finance/medication management  exercises, external aids, brain injury education, family training, and d/c  planning    3 Hour Rule Minutes: 60 minutes of SLP treatment this session count towards  intensity and duration of therapy requirement. Patient was seen for the full  scheduled time of SLP treatment this session.  Therapy Mode Minutes: Individual: 60 minutes.    Signed by: Mikael Spray, CCC/SLP 04/17/2020 2:00:00 PM

## 2020-04-19 LAB — GLUCOSE WHOLE BLOOD - POCT
Whole Blood Glucose POCT: 148 mg/dL — ABNORMAL HIGH (ref 70–100)
Whole Blood Glucose POCT: 242 mg/dL — ABNORMAL HIGH (ref 70–100)
Whole Blood Glucose POCT: 267 mg/dL — ABNORMAL HIGH (ref 70–100)
Whole Blood Glucose POCT: 196 mg/dL — ABNORMAL HIGH (ref 70–100)

## 2020-04-19 NOTE — Rehab Progress Note (Medilinks) (Signed)
[name]  MRN: [id number]  Account: [id number]  Session Start: 04/19/2020 10:00:00 AM  Session Stop: 04/19/2020 11:00:00 AM    Total Treatment Minutes: 60.00 Minutes    Occupational Therapy  Inpatient Rehabilitation Progress Note - Brief    Rehab Diagnosis: Closed head injury after a fall, Mild anterior wedge  compression fracture of T7  Demographics:            Age: 88Y            Gender: Female  Rehabilitation Precautions/Restrictions:   Fall precaution, skin precaution, seizure precaution, HOH, impaired vision,  delayed mental processing    SUBJECTIVE  Patient Report: "I slept all night last night"  Pain: Patient currently without complaints of pain.    OBJECTIVE  Vital Signs:                       Before Activity              After Activity  Vitals  Time                 1005                         -  Position/Activity    supine in bed                -  BP Systolic          136                          -  BP Diastolic         81                           -  Pulse                68                           -    Interventions:       Self Care/Home Management:  Pt. assessed for ful body dressing seated EOB  W/ set up for UB w/ increased time and Supervision for LB dressing w/ increased  time and Min verbal cues for safe sequence of steps. Pt. performed functional  mobility <>BR using FWW w/ SBA and no LOB noted but needed Min verbal cues to  maintain position inside walker even w/ theraband to provide visual/tactile cues  all to improve safety w/ use of FWW. Grooming/hyg routine assessed standing at  sink w/ slight sway noted to R and forward w/ 1 slight LOB forward when  unsupported requiring Unilateral support to correct all w/ SBA o nly by  therapist for up to /R/ 7.5 min.       Therapeutic Activities:  Functional mobiltiy training using FWW for long  indoor community distances around unit w/ SBA and no LOB but slight fatigue upon  returning to room and throughout pt required Min verbal cues in  addition to  theraband providing tactile/visual cues for staying inside walker w/ demo of  undernstanidng initally but as pt fatigued it was difficult to maintain  requiring additional cues all to improve endurance to prepare for d/c to ILF and  needing to walk to dining room daily for meals. Mass practice STS and SPT in  room using FWW and Min verbal/visual/tactile cues for proper hand placement w/  pt. demoing understanding 75% of the time w/ pt. needing additioanl tactile cues  on last attempt to reach back pror to sitting.  Patient was returned to or left in bed. Handoff to nurse completed.  Bed alarm in place and activated.  Oriented to call bell and placed within reach.  Personal items within reach.  Assistive devices positioned out of reach.  Bed placed in lowest position.  Pain Reassessment: Pain was not reassessed as no pain was reported.    Education Provided:    Education Provided: Activities of daily living. Functional transfers. Plan of  care. Safety. Identifying community barriers.       Audience: Patient.       Mode: Explanation.  Demonstration.       Response: Applied knowledge.  Verbalized understanding.  Needs practice.    ASSESSMENT  Pt. was pleasant and cooperative throughout session w/ increased alertness and  able to perform ADL routine slightly faster then previous session. Pt. was  assessed for functional trasnfers STS and SPT initially w/ limited recall of  proper han dplacement but w/ mass practice and min verbal/visual cues initially  pt was abl eto demo 75% carryover and further training would be beneicial to  prepare for d/c home.    CARE Tool  Self-Care Functional Assessment    Oral Hygiene: 04 - Supervision: Helper provides supervision for safety or verbal  cues ONLY  Assistive Device(s):   None  Context for oral hygiene:   Standing   at sink  pt performed oral hyg, brushing hair, washing face/hands and applying  cream to hands and oil to face w/ 1 slight LOB forward when unsupported  but able  to correct w/ unilateral support and no other LOB noted      Upper Body Dressing:   05 - Setup or Clean Up Assistance: Helper sets up or cleans up prior to or  following an activity  Assistive Device(s):   None   increased time needed    Lower Body Dressing: 04 - Supervision: Helper provides supervision for safety or  verbal cues ONLY  Assistive Device(s):   Assistive device for item retrieval/balance   Min verbal cues for safe sequence of steps and increased time.    Putting On/Taking Off Footwear: 05 - Setup or Clean Up Assistance: Helper sets  up or cleans up prior to or following an activity  Assistive Device(s):   None    Mobility Functional Assessment    PLAN  Continued Occupational Therapy is recommended.  Recommended Frequency/Duration/Intensity: 60-120 min/day, 5-6 days/week, for  approx 10-14 days from evaluation on 04/13/2019; 1:1 therapy or group treatment  as appropriate.  Continued Activities Contributing Toward Care Plan: ADL/IADL retraining, ther  ex, ther act, modalities, DME recommendations, d/c planning, equipment, family  education/training, functional mobility/transfer training, energy conservation,  balance training, fall prevention training, functional activity tolerance  training, NMRE, cognitive retraining, ARMEO, RTI, Bioness    3 Hour Rule Minutes: 60 minutes of OT treatment this session count towards  intensity and duration of therapy requirement. Patient was seen for the full  scheduled time of OT treatment this session.  Therapy Mode Minutes: Individual: 60 minutes.    Signed by: Vinetta Bergamo, OT 04/19/2020 11:00:00 AM

## 2020-04-19 NOTE — Rehab Progress Note ([facility]) (Signed)
NAMEKALISA GIRTMAN  MRN: [id number]  Account: [id number]  Session Start: 04/19/2020 1:40:00 PM  Session Stop: 04/19/2020 2:00:00 PM    Total Treatment Minutes:  Minutes    Therapeutic Recreation  Inpatient Rehabilitation Progress Note    Rehab Diagnosis: Closed head injury after a fall, Mild anterior wedge  compression fracture of T7  Demographics:            Age: 64Y            Gender: Female  Rehabilitation Precautions/Restrictions:   Fall precaution, skin precaution, seizure precaution, HOH, impaired vision,  delayed mental processing    SUBJECTIVE  Patient Reports: "I know how to knit"  Pain: Patient currently without complaints of pain.    OBJECTIVE    Interventions: The following treatment was provided:  Leisure/Community Resources: CTRS organized pt ipad to eliminate [employer], group together categories, and decrease visual clutter.  Leisure Education:  Teaching laboratory technician.  Demo'd for pt to receive level of interest for following session. Pt attentive  and asked questions as needed.  Pain Reassessment: Pain was not reassessed as no pain was reported.    Education Provided:    Education Provided: Adaptive leisure activity. Strategies for adapting  activities.       Audience: Patient.       Mode: Explanation.  Demonstration.       Response:  Expressed interest in learning    ASSESSMENT  Length and quality of intervention impacted by pt's need to eat lunch. Following  lunch pt with increased attn and alertness from initial meeting. Visual acuity  better than previously suspected, pt able to locate yarn and loom from accross  room. Pt would benefit from practice with adaptive leisure activities to  increase cog stimulation and prevent symptoms of depression./    PLAN  Therapeutic Recreation services are recommended to address: Activity tolerance,  attn, memory, problem solving, seated balance, leisure independence    Signed by: Sindy Messing, TR 04/19/2020 2:00:00 PM

## 2020-04-19 NOTE — Rehab Progress Note (Medilinks) (Signed)
Yolanda Cox  MRN: [id number]  Account: [id number]  Session Start: 04/19/2020 12:00:00 AM  Session Stop: 04/19/2020 12:00:00 AM    Total Treatment Minutes:  Minutes    Rehabilitation Nursing  Inpatient Rehabilitation Shift Assessment    Rehab Diagnosis: Closed head injury after a fall, Mild anterior wedge  compression fracture of T7  Demographics:            Age: 8Y            Gender: Female  Primary Language: English    Date of Onset:  04/09/2020  Date of Admission: 04/11/2020 5:40:00 PM    Rehabilitation Precautions Restrictions:   Fall precaution, skin precaution, seizure precaution, HOH, impaired vision,  delayed mental processing    Patient Report: "I am doing ok"  Patient/Caregiver Goals:  "to get better"    Wounds/Incisions: See Epic    Medication Review: No clinically significant medication issues identified this  shift.    Bowel and Bladder Output:                       Bladder (# only)             Bowel (# only)  Number of Episodes  Continent            3                            0  Incontinent          0                            0    Additional Education Provided:    Education Provided: Precautions. Plan of care. Safety issues and interventions.  Fall protocol. Supervision requirements. Skin/wound care. Critical pressure  areas. Prevention of skin breakdown. Medication. Name and dosage. Purpose. Side  Effects.       Audience: Patient.       Mode: Explanation.       Response: Verbalized understanding.  Needs reinforcement.    TEAM CARE PLAN  Identified problems from team documentation:  Problem: Impaired Cognition  Cognition: Primary Team Goal: Pt will answer orientation and biographical  questions with >90% accuracy given min cuing in order to improve memory./    Problem: Impaired Leisure Skills  Leisure Skills: Primary Team Goal: Pt and caregiver will describe opportunities  and resources for social engagement in the community./    Problem: Impaired Mobility  Mobility: Primary Team Goal: Pt will  correctly demonstrate safe sit<>stand  transfers and ambulation using FWW under direct SUPV or touch assist via  clinical team./Active    Problem: Impaired Self-care Mgmt/ADL/IADL  Self Care: Primary Team Goal: Pt will complete toilet transfers and all  associated tasks with supervision using AD as indicated to ensure safety./    Problem: Safety Risk and Restraint  Safety: Primary Team Goal: Patient will recognized limitations and call for  assistance 100% of the time before getting out of bed./Active    Please review Integrated Patient View Care Plan Flowsheet for Team identified  Problems, Interventions, and Goals.    Signed by: Elby Beck, RN 04/19/2020 2:29:00 AM

## 2020-04-19 NOTE — Rehab Progress Note (Medilinks) (Signed)
Yolanda Cox  MRN: [id number]  Account: [id number]  Session Start: 04/19/2020 12:00:00 AM  Session Stop: 04/19/2020 12:00:00 AM    Total Treatment Minutes:  Minutes    Rehabilitation Nursing  Inpatient Rehabilitation Shift Assessment    Rehab Diagnosis: Closed head injury after a fall, Mild anterior wedge  compression fracture of T7  Demographics:            Age: 6Y            Gender: Female  Primary Language: English    Date of Onset:  04/09/2020  Date of Admission: 04/11/2020 5:40:00 PM    Rehabilitation Precautions Restrictions:   Fall precaution, skin precaution, seizure precaution, HOH, impaired vision,  delayed mental processing    Patient Report: " I am ok "  Patient/Caregiver Goals:  "to get better"    Wounds/Incisions: See Epic    Medication Review: No clinically significant medication issues identified this  shift.    Bowel and Bladder Output:                       Bladder (# only)             Bowel (# only)  Number of Episodes  Continent            3                            0  Incontinent          0                            0    Additional Education Provided:    Education Provided: Precautions. Pain management. Pain scale. Medication  options. Plan of care. Safety issues and interventions. Fall protocol.  Supervision requirements. Use of adaptive devices. Skin/wound care. Prevention  of skin breakdown. Safety. Medication. Name and dosage. Administration. Purpose.  Diabetes. Hypoglycemia causes/symptoms/treatment. Hyperglycemia  causes/symptoms/treatment.       Audience: Patient.       Mode: Explanation.       Response: Verbalized understanding.    TEAM CARE PLAN  Identified problems from team documentation:  Problem: Impaired Cognition  Cognition: Primary Team Goal: Pt will answer orientation and biographical  questions with >90% accuracy given min cuing in order to improve memory./    Problem: Impaired Leisure Skills  Leisure Skills: Primary Team Goal: Pt and caregiver will describe  opportunities  and resources for social engagement in the community./    Problem: Impaired Mobility  Mobility: Primary Team Goal: Pt will correctly demonstrate safe sit<>stand  transfers and ambulation using FWW under direct SUPV or touch assist via  clinical team./Active    Problem: Impaired Self-care Mgmt/ADL/IADL  Self Care: Primary Team Goal: Pt will complete toilet transfers and all  associated tasks with supervision using AD as indicated to ensure safety./    Problem: Safety Risk and Restraint  Safety: Primary Team Goal: Patient will recognized limitations and call for  assistance 100% of the time before getting out of bed./Active    Please review Integrated Patient View Care Plan Flowsheet for Team identified  Problems, Interventions, and Goals.    Signed by: Judie Petit, RN 04/19/2020 12:31:00 PM

## 2020-04-19 NOTE — Progress Notes (Signed)
PHYSICAL MEDICINE AND REHABILITATION  PROGRESS NOTE -- FACE-TO-FACE ENCOUNTER    Date Time: 04/19/20 12:41 PM  Patient Name: Yolanda Cox, Yolanda Cox    Admission date:  04/11/2020    Subjective:     No acute events overnight. Patient feels well today without current complaints. Had a good therapy session this morning. Slept well last night.    Functional Status:     PT:  Pt has decreased foot clearance bilat, but less clearance noted on R side this  session. Pt reports pain along iliotibial band which improves after adjusting RW  to higher height. Pt has increased gait speed with cues throughout session, from  self selected speed of 0.4m/s to 0.5m/s with cues for increasing safe speed. Pt  will continue to benefit from practicing variable speed amb and obstacle  negotiation to prevent falls.    OT:  Pt. was pleasant and cooperative throughout session w/ increased alertness and  able to perform ADL routine slightly faster then previous session. Pt. was  assessed for functional trasnfers STS and SPT initially w/ limited recall of  proper han dplacement but w/ mass practice and min verbal/visual cues initially  pt was abl eto demo 75% carryover and further training would be beneicial to  prepare for d/c home.      SLP:  Pt greatly benefits from mass repetition of newly learned material.  Additionally, continues to require use of external aids when recalling  orientation/daily schedule. For future sessions, SLP recommends aids be placed  in direct line of sight for best use.    Medications:   Medication reviewed by me:     Scheduled Meds: PRN Meds:   amLODIPine, 10 mg, Oral, Daily  aspirin, 325 mg, Oral, Daily  atorvastatin, 40 mg, Oral, QHS  calcium citrate-vitamin D, 2 tablet, Oral, Daily  folic acid, 1 mg, Oral, Daily  insulin lispro, 1-3 Units, Subcutaneous, QHS  insulin lispro, 1-5 Units, Subcutaneous, TID AC  lactobacillus/streptococcus, 1 capsule, Oral, Daily  levETIRAcetam, 500 mg, Oral, Q12H [facility]  levothyroxine,  75 mcg, Oral, Daily at 0600  multivitamin, 1 tablet, Oral, Daily  sertraline, 50 mg, Oral, Daily  vitamin D, 25 mcg, Oral, Daily        Continuous Infusions:   acetaminophen, 650 mg, Q6H PRN  albuterol-ipratropium, 3 mL, Q6H PRN  butalbital-acetaminophen-caffeine, 1 tablet, Q4H PRN  dextrose, 15 g of glucose, PRN   And  dextrose, 12.5 g, PRN   And  glucagon (rDNA), 1 mg, PRN  magnesium hydroxide, 30 mL, Daily PRN  melatonin, 3 mg, QHS PRN  ondansetron, 4 mg, Q8H PRN  senna-docusate, 2 tablet, QHS PRN          Medication Review  1. A complete drug regimen review was completed: Yes  2. Were any drug issues found during review?: No      Review of Systems:   A comprehensive review of systems was: No fevers, chills, nausea, vomiting, chest pain, shortness of breath, cough, headache, double vision.  All others negative.    Physical Exam:     Vitals:    04/18/20 1752 04/19/20 0548 04/19/20 0832 04/19/20 1013   BP: 133/67 139/70 134/76 136/81   Pulse: 66 62 60 68   Resp: 17 18 16     Temp: 98.8 F (37.1 C) 97.5 F (36.4 C) 98.4 F (36.9 C)    TempSrc: Oral Oral Oral    SpO2: 96% 93% 96%    Weight:       Height:  Intake and Output Summary (Last 24 hours) at Date Time    Intake/Output Summary (Last 24 hours) at 04/19/2020 1241  Last data filed at 04/19/2020 0900  Gross per 24 hour   Intake 500 ml   Output 0 ml   Net 500 ml     P.O.: 300 mL (04/19/20 0900)     Urine: 0 mL (04/19/20 0900)           General: WN/WD, in NAD, seen lying in bed  HEENT: NC/AT, moist mucosal membranes, anicteric sclera  Cardiac: regular rate, extremities warm and well perfused  Resp: non-labored breathing, acyanotic  Abdomen: soft, non-tender, non-distended  Neuro: Awake and alert, oriented to person and place, knows the month and year but not day, speech fluent and spontaneous without dysarthria but is slowed, mild proximal weakness but moves extremities with AG strength throughout    Labs:   No results for input(s): GLUCOSEWHOLE in the last  24 hours.                       Results     Procedure Component Value Units Date/Time    Glucose Whole Blood - POCT [[id number]]  (Abnormal) Collected: 04/19/20 1143     Updated: 04/19/20 1150     Whole Blood Glucose POCT 242 mg/dL     Glucose Whole Blood - POCT [[id number]]  (Abnormal) Collected: 04/19/20 0741     Updated: 04/19/20 0748     Whole Blood Glucose POCT 148 mg/dL     Glucose Whole Blood - POCT [[id number]]  (Abnormal) Collected: 04/18/20 2027     Updated: 04/18/20 2048     Whole Blood Glucose POCT 140 mg/dL     Glucose Whole Blood - POCT [[id number]]  (Abnormal) Collected: 04/18/20 1648     Updated: 04/18/20 1654     Whole Blood Glucose POCT 222 mg/dL                Rads:   Radiological Procedure reviewed.  Radiology Results (24 Hour)     ** No results found for the last 24 hours. **              Assessment and Plan:     87yo F with prior traumatic brain bleed, NPH, HTN, DM, HLD, hypothyroidism, RA, and osteoporosis presented after syncopal event and was found to have aspiration pneumonia thought to be secondary to seizures    #Rehab  - continue PT/OT/SLP  - 1/13 patient was discussed this morning in team conference, see medilinks notes for details    #New onset seizures  - patient with syncopal episode in assisted living facility and then seizure activity while in ED  - spot EEG negative  - CT head and MRI brain negative for acute intracranial process  - seen by neurology, Dr. Marney Doctor while in acute hospital  - seizure precautions  - continue with keppra 500mg  BID    #Hx of traumatic brain bleed/NPH  - CT and MRI showing chronic changes only; without evidence of hydrocephalus  - does have some cognitive impairments that may be secondary to this prior injury  - SLP will be working with patient on cognition    #Aspiration pneumonia  - thought to be secondary to seizure activity  - was on zosyn while in acute hospital and transitioned to augmentin to complete 5 day course on admission to rehab, stop date  1/16  - probiotic, incentive spirometer, duonebs PRN  - will need repeat  CXR in 4-6 weeks for resolution  - 1/17 patient has completed abx, no current signs/symptoms of infection    #HTN  - amlodipine 10mg   - 1/19 BP at goal    #DM  - HbA1c 7.6  - per daughter the patient's blood sugar elevates when she is stressed and then it returns to normal, decision was made to hold off on starting an oral agent  - continue SSI  - 1/19 blood sugar remains elevated but daughter prefers to not have patient on standing medications    #HLD  - atorvastatin 40mg     #Hypothyroidism  - levothyroxine    #Anemia  - likely secondary to chronic disease  - Hb 10.5 on admission, continue to monitor with intermittent cbc    #T7 compression fracture  - age indeterminate with no acute findings on exam, continue to monitor    #Depression  - zoloft 50mg     #Rheumatoid arthritis  - on methotrexate qweekly, will confirm date and dose, likely start after abx completed    #Osteoporosis  - citracal-vitamin D 2 tab daily    #DVT ppx  - ASA 325    #Dispo  EDD 1/25    Patient Active Problem List   Diagnosis    Fall, initial encounter    Seizure after head injury    Debility       Continue comprehensive and intensive inpatient rehab program, including:   Physical therapy 60-120 min daily, 5-6 times per week, Occupational therapy 60-120 min daily, 5-6 times per week, speech therapy 60-120 min daily, 5-6 times per week, Case management and Rehabilitation nursing    Signed by: Charlestine Massed, MD MD    [facility]    If there are questions or concerns about the content of this note or information contained within the body of this dictation they should be addressed directly with the author for clarification.

## 2020-04-19 NOTE — Progress Notes (Signed)
CASE MANAGEMENT PROGRESS NOTE      Patient: Yolanda Cox (87 y.o. female)  Admission Date: 04/11/2020 [facility] Day 8)    Active Hospital Problems    Diagnosis    Debility       Length of stay: Hospital Day 8    CM spoke to the patient daughter via phone in regard to discharge plans and coordination.     The plan to independent living at [facility] once she ready for discharge.     The patients daughter shared with CM that she plans to hire caregiver support for the patient for the hours of 12:00Pm - 5:30PM to assist the patient to dinner. He daughter will up hours or add evening hours if needed.      The patient continues to make progress with both PT and OT in each session.     The clinical team will meet tomorrow 04/20/20 at 11:00 to discuss functional status, barriers to [place], [facility] & [place] recommendations.    CM will continue to follow final discharge needs.     Ms. Dallie Piles, MSW   Rehabilitation Case Manager  [facility] In[facility]   [address]   [place], [place] [address]  [place] [phone]

## 2020-04-19 NOTE — Progress Notes (Signed)
MEDICINE PROGRESS NOTE  Kelilah Hebard R. Geoffery Spruce MD    Date Time: 04/19/20 1:08 PM  Patient Name: Yolanda Cox  Attending Physician: Arizona Constable, MD, MD    CC: Medical management    Interval History/24 hour events: Tolerating PT slept better, no new complaints overnight    Patient is an 87 y.o. right hand-dominant female with prior traumatic brain bleed, NPH, HTN, DM, HLD, hypothyroidism, RA, and osteoporosis who presented to the ED from [facility] assisted living after having a syncopal episode and hitting her head. Reportedly patient was altered while in the ambulance and had seizure activity while in the ED. CT head was done and showed no evidence of acute intracranial injury. EEG was done and was reported as normal. MRI brain showed moderate generalized intraparenchymal volume loss with moderately advanced chronic microvascular changes but no acute changes. Neurology was consulted and patient was started on keppra. She was found to have pneumonia, which was thought likely to be secondary to aspiration from seizure activity. She was started initially on IV abx and then transitioned to oral by the time of discharge. Per documentation the remainder of her hospitalization was unremarkable. She was evaluated by PT/OT/SLP and deemed appropriate for acute inpatient rehab and was transferred to the [facility] inpatient rehab unit on 04/11/2020.   Review of Systems:   Review of Systems - Negative except As in HPI      Physical Exam:     Patient Vitals for the past 24 hrs:   BP Temp Temp src Pulse Resp SpO2   04/19/20 1013 136/81 -- -- 68 -- --   04/19/20 0832 134/76 98.4 F (36.9 C) Oral 60 16 96 %   04/19/20 0548 139/70 97.5 F (36.4 C) Oral 62 18 93 %   04/18/20 1752 133/67 98.8 F (37.1 C) Oral 66 17 96 %       Intake and Output Summary (Last 24 hours) at Date Time    Intake/Output Summary (Last 24 hours) at 04/19/2020 1308  Last data filed at 04/19/2020 0900  Gross per 24 hour   Intake 500 ml   Output 0 ml    Net 500 ml       General: awake, alert, oriented x 1; no acute distress.  Cardiovascular: regular rate and rhythm, no murmurs, rubs or gallops  Lungs: clear to auscultation bilaterally, without wheezing, rhonchi, or rales  Abdomen: soft, non-tender, non-distended; no palpable masses, no hepatosplenomegaly, normoactive bowel sounds, no rebound or guarding  Extremities: no clubbing, cyanosis, or edema  Other: left side of scalp some tenderness    Meds:     Medications were reviewed:    Labs:     Results     Procedure Component Value Units Date/Time    Glucose Whole Blood - POCT [[id number]]  (Abnormal) Collected: 04/19/20 1143     Updated: 04/19/20 1150     Whole Blood Glucose POCT 242 mg/dL     Glucose Whole Blood - POCT [[id number]]  (Abnormal) Collected: 04/19/20 0741     Updated: 04/19/20 0748     Whole Blood Glucose POCT 148 mg/dL     Glucose Whole Blood - POCT [[id number]]  (Abnormal) Collected: 04/18/20 2027     Updated: 04/18/20 2048     Whole Blood Glucose POCT 140 mg/dL     Glucose Whole Blood - POCT [[id number]]  (Abnormal) Collected: 04/18/20 1648     Updated: 04/18/20 1654     Whole Blood Glucose POCT 222 mg/dL  Imaging personally reviewed, including:       Assessment:     Patient Active Problem List   Diagnosis    Fall, initial encounter    Seizure after head injury    Debility           Plan:   -Continue Keppra 500 twice daily  -Continue amlodipine for hypertension  -Blood sugar running high hemoglobin A1c was 7.6, (per daughter blood sugar goes and she is under stress doesn't want any oral hypoglycemic at present we'll monitor  -Was on methotrexate for rheumatoid arthritis as outpatient may resume soon  -On aspirin 325 for DVT prophylaxis        Disposition:   Today's date: 04/19/2020        Signed by: Arizona Constable, MD, MD

## 2020-04-19 NOTE — Rehab Progress Note ([facility]) (Signed)
NAMEPHYLLIS Cox  MRN: [id number]  Account: [id number]  Session Start: 04/19/2020 9:00:00 AM  Session Stop: 04/19/2020 10:00:00 AM    Total Treatment Minutes: 60.00 Minutes    Speech Language Pathology  Inpatient Rehabilitation Progress Note    Rehab Diagnosis: Closed head injury after a fall, Mild anterior wedge  compression fracture of T7  Demographics:            Age: 82Y            Gender: Female  Rehabilitation Precautions/Restrictions:   Fall precaution, skin precaution, seizure precaution, HOH, impaired vision,  delayed mental processing    SUBJECTIVE  Patient Report: "Your name is Yolanda Cox and her name is Marine scientist."  Patient/Caregiver Goals:  "to get better"  Pain: Patient currently without complaints of pain.    OBJECTIVE    Interventions:       Speech Treatment: Pt provided with mock medication label to assess visual  scanning and L-attention. Pt unable to locate pertinent details within the  label, as she reported inability to "read" the information. Visual anchor  provided on L-side of label to assist in scanning; however was not beneficial.  Therefore task discontinued. Spaced retrieval (SR) utilized to improve recall of  new information (therapist names). Using SR, Pt able to recall 2-therapist names  (e.g. Christophr Calix /T/ Madelyn) x30 minutes. A 3rd name was attempted Elnita Maxwell- PT);  however pt unable to retain despite max cuing. Pt presented with various safety  scenarios (verbally) in order to assess functional problem solving and safety  awareness. Pt able to generate solutions with >80% accuracy given min-mod cuing  for repetition. Additionally, pt needed extra time for processing and generating  responses.    Patient was returned to or left in bed. Handoff to nurse completed.  Bed alarm in place and activated.  Oriented to call bell and placed within reach.  Personal items within reach.  Assistive devices positioned out of reach.  Bed placed in lowest position.  Pain Reassessment: Pain was not reassessed as  no pain was reported.  Equipment Provided: None at this time.    Education Provided:    Education Provided: Plan of care. Safety issues and interventions. Supervision  requirements. Cognitive functioning. Compensatory cognitive strategies/aids       Audience: Patient.       Mode: Explanation.  Demonstration.  Teacher, English as a foreign language provided.       Response: Verbalized understanding.  Demonstrated skill.  Needs practice.    ASSESSMENT  Breakdowns in reading/interpretation of visual stimulus appear to be related to  visual deficits vs. L-attention. Although do suspect a mild deficit in this  area, but does not appear to impact her ability to locate items in her  environment. In order to read effectively, pt requires large/dark font. This  will impact her ability to read medicine labels provided by a pharmacy,  therefore will need assistance with this upon d/c home. SR proved to be an  effective memory treatment and would benefit from additional use in future  sessions.    Long Term Goals: Pt will recall 3-4 newly presented details with 80% accuracy  given min cuing in order to improve memory skills.  Pt will locate pertinent details within functional text (e.g. medicine labels /  bills / recipes) with 90% accuracy given min cuing in order to improve  L-attention and visual scanning skills.  Pt will complete simple medication management tasks with 90% accuracy given min  cuing in order to improve executive functioning  skills.  2-weeks  Short Term Goals: Pt will answer orientation and biographical questions with  >90% accuracy given min cuing in order to improve memory.  Pt will locate pertinent details within functional text (e.g. medicine labels /  bills / recipes) with 80% accuracy given min-mod cuing in order to improve  L-attention and visual scanning skills.  Pt will complete simple medication management tasks with 80% accuracy given  min-mod cuing in order to improve executive functioning skills.  1-week    Progress  Toward Goals:   SHORT TERM GOAL REVIEW:       1. Pt will answer orientation and biographical questions with >90% accuracy  given min cuing in order to improve memory. - Met       2. Pt will locate pertinent details within functional text (e.g. medicine  labels / bills / recipes) with 80% accuracy given min-mod cuing in order to  improve L-attention and visual scanning skills. - Not Met: Impacted by visual  deficits. Discontinue.       New Goal: Pt will demonstrate improved sequencing by completing 3-4 step  functional tasks with >80% accuracy given min-mod cuing.       3. Pt will complete simple medication management tasks with 80% accuracy  given min-mod cuing in order to improve executive functioning skills. - Not Met:  Pt's daughter/caregiver will complete medication tasks upon d/c home. Goal no  longer appropriate. Discontinue.       New Goal: Pt will solve simple problems related to daily living with >80%  accuracy given min-mod cuing in order to improve safety awareness and problem  solving.  Time frame to achieve short term goal(s):  1-week   LONG TERM GOAL REVIEW:       1. Pt will recall 3-4 newly presented details with 80% accuracy given min  cuing in order to improve memory skills. - Not Met: min-mod cuing needed at this  time       New Goal: Pt will recall 3-4 newly presented details with 80% accuracy  given min-mod cuing in order to improve memory skills.       2. Pt will locate pertinent details within functional text (e.g. medicine  labels / bills / recipes) with 90% accuracy given min cuing in order to improve  L-attention and visual scanning skills. - Not Met: Impacted by visual deficits.  Discontinue.       New Goal: Pt will demonstrate improved sequencing by completing 3-4 step  functional tasks with >90% accuracy given min cuing.       3. Pt will complete simple medication management tasks with 90% accuracy  given min cuing in order to improve executive functioning skills. - Not Met:  Pt's  daughter/caregiver will complete medication tasks upon d/c home. Goal no  longer appropriate. Discontinue.       New Goal: Pt will solve simple problems related to daily living with >90%  accuracy given min cuing in order to improve safety awareness and problem  solving.  Time frame to achieve long term goal(s):  2-weeks    PLAN  Continued Speech Language Pathology is recommended to address:  Recommended Frequency/Duration/Intensity: 60-120 minutes 1-2x day, 5-6x week,  for 10-14 days  Continued Activities Contributing Toward Care Plan: dynamic assessment,  neuro-cognitive retraining, memory strategies, finance/medication management  exercises, external aids, brain injury education, family training, and d/c  planning  The patient is appropriate for group treatment. Patient will benefit from group  therapy to practice communication and/or  functional cognitive skills in a  setting that mimics a real world environment (e.g., work, outing with friends).,  receive support and constructive criticism from peers in a group setting,  practice and carry over skills learned in individual sessions with new partners,  clinicians and contexts, practice applying strategies to manage communication or  cognitive challenges in a distracting environment, experience modeling by peers    Care Plan  Identified problems from team documentation:  Problem: Impaired Cognition  Cognition: Primary Team Goal: Pt will answer orientation and biographical  questions with >90% accuracy given min cuing in order to improve memory./    Problem: Impaired Leisure Skills  Leisure Skills: Primary Team Goal: Pt and caregiver will describe opportunities  and resources for social engagement in the community./    Problem: Impaired Mobility  Mobility: Primary Team Goal: Pt will correctly demonstrate safe sit<>stand  transfers and ambulation using FWW under direct SUPV or touch assist via  clinical team./Active    Problem: Impaired Self-care Mgmt/ADL/IADL  Self  Care: Primary Team Goal: Pt will complete toilet transfers and all  associated tasks with supervision using AD as indicated to ensure safety./    Problem: Safety Risk and Restraint  Safety: Primary Team Goal: Patient will recognized limitations and call for  assistance 100% of the time before getting out of bed./Active    Add/Update Problems from this Treatment:  Update of existing problem:   Cognition: Pt can answer orientation/biographical questions with >90% accuracy  given min cuing for navigating memory log/external aids.    Discipline:  Speech/Language Pathology    Please review Integrated Patient View Care Plan Flowsheet for Team identified  Problems, Interventions, and Goals.    3 Hour Rule Minutes: 60 minutes of SLP treatment this session count towards  intensity and duration of therapy requirement. Patient was seen for the full  scheduled time of SLP treatment this session.  Therapy Mode Minutes: Individual: 60 minutes.    Signed by: Mikael Spray, CCC/SLP 04/19/2020 10:00:00 AM

## 2020-04-20 LAB — CBC AND DIFFERENTIAL
Absolute NRBC: 0 10*3/uL (ref 0.00–0.00)
Basophils Absolute Automated: 0.07 10*3/uL (ref 0.00–0.08)
Basophils Automated: 0.8 %
Eosinophils Absolute Automated: 0.03 10*3/uL (ref 0.00–0.44)
Eosinophils Automated: 0.3 %
Hematocrit: 29.6 % — ABNORMAL LOW (ref 34.7–43.7)
Hgb: 9.6 g/dL — ABNORMAL LOW (ref 11.4–14.8)
Immature Granulocytes Absolute: 0.76 10*3/uL — ABNORMAL HIGH (ref 0.00–0.07)
Immature Granulocytes: 8.6 %
Lymphocytes Absolute Automated: 1.36 10*3/uL (ref 0.42–3.22)
Lymphocytes Automated: 15.4 %
MCH: 31.1 pg (ref 25.1–33.5)
MCHC: 32.4 g/dL (ref 31.5–35.8)
MCV: 95.8 fL (ref 78.0–96.0)
MPV: 10.6 fL (ref 8.9–12.5)
Monocytes Absolute Automated: 2.58 10*3/uL — ABNORMAL HIGH (ref 0.21–0.85)
Monocytes: 29.2 %
Neutrophils Absolute: 4.05 10*3/uL (ref 1.10–6.33)
Neutrophils: 45.7 %
Nucleated RBC: 0 /100 WBC (ref 0.0–0.0)
Platelets: 214 10*3/uL (ref 142–346)
RBC: 3.09 10*6/uL — ABNORMAL LOW (ref 3.90–5.10)
RDW: 16 % — ABNORMAL HIGH (ref 11–15)
WBC: 8.85 10*3/uL (ref 3.10–9.50)

## 2020-04-20 LAB — GLUCOSE WHOLE BLOOD - POCT
Whole Blood Glucose POCT: 137 mg/dL — ABNORMAL HIGH (ref 70–100)
Whole Blood Glucose POCT: 208 mg/dL — ABNORMAL HIGH (ref 70–100)
Whole Blood Glucose POCT: 221 mg/dL — ABNORMAL HIGH (ref 70–100)
Whole Blood Glucose POCT: 226 mg/dL — ABNORMAL HIGH (ref 70–100)

## 2020-04-20 LAB — BASIC METABOLIC PANEL
Anion Gap: 8 (ref 5.0–15.0)
BUN: 18 mg/dL (ref 7.0–19.0)
CO2: 22 mEq/L (ref 21–29)
Calcium: 9.1 mg/dL (ref 7.9–10.2)
Chloride: 109 mEq/L (ref 100–111)
Creatinine: 0.7 mg/dL (ref 0.4–1.5)
Glucose: 138 mg/dL — ABNORMAL HIGH (ref 70–100)
Potassium: 3.7 mEq/L (ref 3.5–5.1)
Sodium: 139 mEq/L (ref 136–145)

## 2020-04-20 LAB — GFR: EGFR: >60

## 2020-04-20 LAB — HEMOLYSIS INDEX: Hemolysis Index: 7 (ref 0–24)

## 2020-04-20 NOTE — Rehab Progress Note ([facility]) (Signed)
NAMEJENESSA Cox  MRN: [id number]  Account: [id number]  Session Start: 04/20/2020 10:00:00 AM  Session Stop: 04/20/2020 11:00:00 AM    Total Treatment Minutes: 60.00 Minutes    Physical Therapy  Inpatient Rehabilitation Treatment Note    Rehab Diagnosis: Closed head injury after a fall, Mild anterior wedge  compression fracture of T7  Demographics:            Age: 86Y            Gender: Female  Rehabilitation Precautions/Restrictions:   Fall precaution, skin precaution, seizure precaution, HOH, impaired vision,  delayed mental processing    OBJECTIVE  Vital Signs:                       Before Activity              After Activity  Vitals  Time                 10:00                        -  Position/Activity    Sitting upright on EOB       -  BP Systolic          144                          -  BP Diastolic         70                           -  Pulse                62                           -  O2 Saturation        97%                          -    Interventions:       Therapeutic Activities:  Pt received supine in bed, light off, bed alarm  activated, 3/4 railings raised, call bell, and all personal items w/in reach.  Gait training: To prepare for d/c placement, focus on part task practice of  functional reaching while ambulating. Using Touch Assist for steadying, pt  ambulates >50 ft while performing cognitive dual task of listing vacation items  while reaching outside BOS for sticky notes placed at various heights BL to pt.  Pt progresses to reaching outside BOS for post-it notes on uneven ground /R/20  ft to increase dynamic balance and safety while navigating evolving environment.  Using FWW and Min A, pt performed dual cognitive task of listing disc colors and  marching /R/50 ft over flat colored discs spaced /R/1.5 ft apart as a visual cue  to increase step height and length for more efficient gait mechanics. Progressed  to side stepping /R/50 ft BL over discs w/o AD and using helper for Mod A  to  activate contraction of BL hip abductors in the frontal plane to increase  activation of hip stabilizers during functional movement patterns.  Patient was returned to or left in bed. Handoff to nurse completed.  Bed alarm in place and activated.  Oriented to call bell and placed within reach.  Personal items within reach.  Assistive devices positioned out of reach.  Bed placed in lowest position.  Telesitter in use.    ASSESSMENT  Pt ambulates w/ decreased gait speed during dual cog tasks 2/2 to problem  solving deficits. During all gait training activities, pt benefits from verbal  and visual cues to anteriorly shift COM to front of FWW, and to increase step  length and step height for increased foot clearance during swing-phase. Pt  demonstrates appropriate BL weight shift and trunk extension while reaching for  objects outside of COM, however, requires verbal cuing to place FWW as close to  target as possible before initiating reach. During side-stepping intervention,  pt presents w/ decreased lateral step length and height 2/2 decreased strength  of BL hip abductors and decreased dynamic balance. Recommending dynamic standing  balance training in multiple planes to increase SLS time for improved gait  mechanics and to increase safety and functional mobility.    3 Hour Rule Minutes: 60 minutes of PT treatment this session count towards  intensity and duration of therapy requirement. Patient was seen for the full  scheduled time of PT treatment this session.  Therapy Mode Minutes: Individual: 60 minutes.    Assessment and treatment was provided under the 1:1 direct supervision of Juluis Pitch, PT, DPT, NCS.    Signed by: Everlene Other, SPT 04/20/2020 11:00:00 AM    Direct 1:1 supervision of Everlene Other, student of physical therapy, was  proivded throughout this session   - CoSigned By: Juluis Pitch, PT 04/20/2020 8:24:22 PM

## 2020-04-20 NOTE — Rehab Progress Note ([facility]) (Signed)
Yolanda Cox  MRN: [id number]  Account: [id number]  Session Start: 04/19/2020 2:00:00 PM  Session Stop: 04/19/2020 3:00:00 PM    Total Treatment Minutes: 60.00 Minutes    Physical Therapy  Inpatient Rehabilitation Progress Note    Rehab Diagnosis: Closed head injury after a fall, Mild anterior wedge  compression fracture of T7  Demographics:            Age: 31Y            Gender: Female  Rehabilitation Precautions/Restrictions:   Fall precaution, skin precaution, seizure precaution, HOH, impaired vision,  delayed mental processing    SUBJECTIVE  Patient Report: " I feel good; I have no pain today."  Patient/Caregiver Goals:  "to get better"  Pain: Patient currently without complaints of pain.    OBJECTIVE  Vital Signs:                       Before Activity              After Activity  Vitals  Time                 14:00                        -  Position/Activity    Upright in bed, HOB elevated -  BP Systolic          137                          -  BP Diastolic         73                           -  Pulse                66                           -  O2 Saturation        97%                          -    Interventions:       Therapeutic Activities:  Pt received supine in bed, bed alarm activated,  call bell and all needs w/in reach and telesitter in place. Gait training: Pt  performed >250 ft of dual task ambulation and  cognitive task of listing items  needed from the grocery store to cook favorite meal, using FWW and Min A to  increase motor control and functional capacity in a distracting environment.  Pt  achieved >66ft of backwards walking to increase dynamic balance using FWW, Touch  Assist for steadying, w/ blue theraband tied around bars of AD for increased  multimodal cuing to facilitate anterior shift of COM during ambulation. Using  FWW and Min A, pt navigated 20 ft of cones and weights of varying heights placed  /R/2 ft apart x 4 laps to increase step height and length during ambulation.  Trials  1-2 performed using step-to pattern and trials 3-4 performed using  step-through pattern to facilitate part-task practice.  Patient was returned to or left in bed.   w/ granddaughter and telesitter present  Handoff to nurse completed.  Bed alarm in place and activated.  Oriented to call  bell and placed within reach.  Personal items within reach.  Assistive devices positioned out of reach.  Bed placed in lowest position.  Telesitter in use.    Pain Reassessment: Pain was not reassessed as no pain was reported.    Lower Extremity Orthosis: Patient does not present with foot drop or ankle  instability and does not need an orthotic.  Equipment Provided/Ordered/Recommended: The patient has been given voluntary  selection of a medical equipment provider, as per federal law. Arrangements have  been made with a company of the patient?s choosing. The patient has been  educated that choosing a company outside of [employer] company?s preferred  provider may affect their insurance coverage.The patient has been provided with  information on care of equipment as appropriate.   PT recommending consistent use of FWW; daughter confirmed pt owns FWW at home  prior to hospital admission.    Education Provided:    Education Provided: Rehab techniques and procedures. Pt oriented to PT treatment  session and rationale of interventions. Safety issues and interventions.  Impaired vision. Fall protocol. Supervision requirements. Use of adaptive  devices. Bed mobility. Functional transfers. Fall prevention/balance training.  Safety. Equipment. Gait. Stair/curb/environmental barrier negotiation.       Audience: Patient.       Mode: Explanation.  Demonstration.       Response: Applied knowledge.  Verbalized understanding.  Demonstrated skill.  Needs practice.  Needs reinforcement.    ASSESSMENT  Pt presents w/ gains in functional transfers, and gait training, requiring only  Touch Assist for safe mobility using FWW. Pt has difficulty  performing dual  cognitive task w/ ambulation using FWW, presenting w/ decreased cadence and  shuffling gait upon cognitive recall of grocery items, and requires increased  verbal cuing for shifting COM anteriorly within FWW. Pt initially uses a step-to  pattern dynamic balance and mobility task of stepping over obstacles, w/ no  major LOB noted. However, pt requires steadying and verbal cuing to increase hip  and knee flexion for full clearance of  BLE. Pt performs step-though pattern in  2 subsequent trials w/ no LOB, and verbal cuing for full BLE clearance. Post  part-task training, pt demonstrates mass practice of gait training w/ increased  step length, increased cadence, and  increased step height for more efficient  gait mechanics and decreased fall risk. Pt will continue to benefit from  combined part-task and mass practice of balance and gait training, with a focus  on dual tasks to increase safety and environmental awareness for return to PLOF.    CARE Tool  Mobility Functional Assessment  Roll Left and Right: 04 - Supervision: Helper provides supervision for safety or  verbal cues ONLY  Assistive Device(s):   None   Bed flat    Sit to Lying: 04 - Supervision: Helper provides supervision for safety or verbal  cues ONLY  Assistive Device(s):   Bed rail   bed flat    Lying to Sitting on Side of Bed: 04 - Touching Assistance: Helper provides  touching/steadying/contact guard  Assistive Device(s):   Bed rail    Sit to Stand: 04 - Touching Assistance: Helper provides  touching/steadying/contact guard  Assistive Device(s):   Rolling walker    Chair/Bed-to-Chair Transfer: 04 - Touching Assistance: Helper provides  touching/steadying/contact guard  Assistive Device(s):   Systems developer Transfer: 04 - Touching Assistance: Helper provides  touching/steadying/contact guard   via stand-step-turn transfer technique with FWW  Walk 10 Feet:  04 - Touching Assistance: Helper provides  touching/steadying/contact  guard   using FWW, cues to increase hip/knee flexion during swing phase of gait to  correct shuffling gait pattern    Walk 50 Feet with Two Turns: 04 - Touching Assistance: Helper provides  touching/steadying/contact guard   using FWW, cues to increase hip/knee flexion during swing phase of gait to  correct shuffling gait pattern    Walk 150 Feet:  04 - Touching Assistance: Helper provides  touching/steadying/contact guard   Pt tolerated >250 ft ambulation using FWW and touch assist for steadying.    Walking 10 Feet on Uneven Surface: 03 - Partial/Moderate Assistance: Helper does  1-25% of the activity   Pt tolerated >30 ft Min A <10% for steadying.      1 Step (curb):  03 - Partial/Moderate Assistance: Helper does 1-25% of the  activity   using FWW with VCs for sequencing and safety with FWW    4 Steps:  09 - Not applicable.    12 Steps: 09 - Not applicable.    Wheelchair: Patient does not use a wheelchair or scooter.    Long Term Goals: Pt will perform 8 sit<>stand transfer from bed <>WC using LRAD  and touch assist, without LOB or rest breaks to demonstrate increased  cardiovascular and BLE strength and endurance.  Pt will ambulate >250 feet w/ FWW and SUPV to   demonstrate an increase in endurance and functional independence to return to  PLOF  Pt will reach outside of BOS 10 consecutive times in standing w/ Min Assist to  demonstrate weight shift in all planes to demonstrate increased dynamic sitting  balance and increased safety.  Pt will ambulate >50 feet w/ FWW, while navigating moving obstacles and touch  assist, to demonstrate increased focus, safety, and functional mobility in a  distracting environment.  Time frame to achieve short term goal(s): 2 weeks from IE on 04/12/20  Short Term Goals: Pt will perform sit<>stand transfer from bed <>WC using LRAD  and SUPV to increase safety and independence.  Pt will ambulate >150 feet w/ FWW and SUPV to  demonstrate an increase in endurance and functional  independence.  Pt will reach outside BOS 5 consecutive times in sitting w/ touch assist to  demonstrate weight shift in all planes to increase safety and dynamic sitting  balance.  Pt will navigate 6" threshold using LRAD and Min Assist to safely simulate curb  negotiation for increased dynamic standing balance and reduced caregiver burden.  2 weeks from IE on 04/12/20    Progress Towards Goals: Pt is progressing towards goals. Pts current barriers  are decreased memory, problem solving, and environmental awareness potentially  impacting safety.    PLAN  Continued Physical Therapy is recommended.  Recommended Frequency/Duration/Intensity: 60-120 minutes, 5-7 days a week for  10-14 days from IE on 04/12/2020.  D/C set for 04/25/2020.  Activities Contributing Toward Care Plan: TherActivities, TherEx, Transfer  training, gait training, static and dynamic balance training in sitting and  standing, cardiovascular endurance training, B LE strength training. Pt will  benefit from skilled care to improve safety, decrease fall risk, and reduce  caregiver burden, Archie planning as part of the interdisciplinary team.  The patient is not appropriate for group treatment.    Care Plan  Identified problems from team documentation:  Problem: Impaired Cognition  Cognition: Primary Team Goal: Pt will answer orientation and biographical  questions with >90% accuracy given min cuing in order to improve memory./  Problem: Impaired Leisure Skills  Leisure Skills: Primary Team Goal: Pt and caregiver will describe opportunities  and resources for social engagement in the community./    Problem: Impaired Mobility  Mobility: Primary Team Goal: Pt will correctly demonstrate safe sit<>stand  transfers and ambulation using FWW under direct SUPV or touch assist via  clinical team./Active    Problem: Impaired Self-care Mgmt/ADL/IADL  Self Care: Primary Team Goal: Pt will complete toilet transfers and all  associated tasks with supervision using AD as  indicated to ensure safety./    Problem: Safety Risk and Restraint  Safety: Primary Team Goal: Patient will recognized limitations and call for  assistance 100% of the time before getting out of bed./Active    Add/Update Problems from this Treatment:  Update of existing problem:   Mobility: grossly SBA for supine<>sit transfers, CGA for sit<>stand and  stand-step-turn transfers with FWW, CGA for gait up to 200-300 feet with FWW    Discipline:  Physical Therapy    Please review Integrated Patient View Care Plan Flowsheet for Team identified  Problems, Interventions, and Goals.    3 Hour Rule Minutes: 60 minutes of PT treatment this session count towards  intensity and duration of therapy requirement. Patient was seen for the full  scheduled time of PT treatment this session.  Therapy Mode Minutes: Individual: 60 minutes.    Assessment and treatment was provided under the 1:1 direct supervision of Juluis Pitch, PT, DPT, NCS.    Signed by: Everlene Other, SPT 04/19/2020 3:00:00 PM    Direct 1:1 supervision of Everlene Other, student of Physical Therapy, was  provided throughout this session. - CoSigned By: Juluis Pitch, PT 04/19/2020  10:44:38 PM

## 2020-04-20 NOTE — Rehab Progress Note ([facility]) (Signed)
[name]  MRN: [id number]  Account: [id number]  Session Start: 04/20/2020 12:00:00 AM  Session Stop: 04/20/2020 12:00:00 AM    Total Treatment Minutes:  Minutes    Rehabilitation Nursing  Inpatient Rehabilitation Shift Assessment    Rehab Diagnosis: Closed head injury after a fall, Mild anterior wedge  compression fracture of T7  Demographics:            Age: 67Y            Gender: Female  Primary Language: English    Date of Onset:  04/09/2020  Date of Admission: 04/11/2020 5:40:00 PM    Rehabilitation Precautions Restrictions:   Fall precaution, skin precaution, seizure precaution, HOH, impaired vision,  delayed mental processing    Patient Report: " I am ok "  Patient/Caregiver Goals:  "to get better"    Wounds/Incisions: See Epic    Medication Review: No clinically significant medication issues identified this  shift.    Bowel and Bladder Output:                       Bladder (# only)             Bowel (# only)  Number of Episodes  Continent            2                            0  Incontinent          0                            0    Additional Education Provided:    Education Provided: Precautions. Pain management. Pain scale. Medication  options. Plan of care. Safety issues and interventions. Fall protocol.  Supervision requirements. Use of adaptive devices. Skin/wound care. Prevention  of skin breakdown. Medication. Name and dosage. Administration. Purpose.  Diabetes. Hypoglycemia causes/symptoms/treatment. Hyperglycemia  causes/symptoms/treatment.       Audience: Patient.       Mode: Explanation.       Response: Verbalized understanding.    TEAM CARE PLAN  Identified problems from team documentation:  Problem: Impaired Cognition  Cognition: Primary Team Goal: Pt will answer orientation and biographical  questions with >90% accuracy given min cuing in order to improve memory./    Problem: Impaired Leisure Skills  Leisure Skills: Primary Team Goal: Pt and caregiver will describe opportunities  and  resources for social engagement in the community./    Problem: Impaired Mobility  Mobility: Primary Team Goal: Pt will correctly demonstrate safe sit<>stand  transfers and ambulation using FWW under direct SUPV or touch assist via  clinical team./Active    Problem: Impaired Self-care Mgmt/ADL/IADL  Self Care: Primary Team Goal: Pt will complete toilet transfers and all  associated tasks with supervision using AD as indicated to ensure safety./    Problem: Safety Risk and Restraint  Safety: Primary Team Goal: Pt. will recognize limitations and call for  assistance before attempting mobility 100% of the time in order to prevent fall  while in rehab/Active    Please review Integrated Patient View Care Plan Flowsheet for Team identified  Problems, Interventions, and Goals.    Signed by: Judie Petit, RN 04/20/2020 10:54:00 AM

## 2020-04-20 NOTE — Rehab Progress Note ([facility]) (Signed)
NAMENOAMI BOVE  MRN: [id number]  Account: [id number]  Session Start: 04/20/2020 12:00:00 AM  Session Stop: 04/20/2020 12:00:00 AM    Total Treatment Minutes:  Minutes    Rehabilitation Nursing  Inpatient Rehabilitation Shift Assessment    Rehab Diagnosis: Closed head injury after a fall, Mild anterior wedge  compression fracture of T7  Demographics:            Age: 27Y            Gender: Female  Primary Language: English    Date of Onset:  04/09/2020  Date of Admission: 04/11/2020 5:40:00 PM    Rehabilitation Precautions Restrictions:   Fall precaution, skin precaution, seizure precaution, HOH, impaired vision,  delayed mental processing    Patient Report: " I'm good, I dont have pain now"  Patient/Caregiver Goals:  "to get better"    Wounds/Incisions: See Epic    Medication Review: No clinically significant medication issues identified this  shift.    Bowel and Bladder Output:                       Bladder (# only)             Bowel (# only)  Number of Episodes  Continent            1                            0  Incontinent          0                            0    Additional Education Provided:    Education Provided: Pain management. Medication options. Safety issues and  interventions. Fall protocol. Impaired sensation. Supervision requirements.       Audience: Patient.       Mode: Explanation.       Response: Verbalized understanding.    TEAM CARE PLAN  Identified problems from team documentation:  Problem: Impaired Cognition  Cognition: Primary Team Goal: Pt will answer orientation and biographical  questions with >90% accuracy given min cuing in order to improve memory./    Problem: Impaired Leisure Skills  Leisure Skills: Primary Team Goal: Pt and caregiver will describe opportunities  and resources for social engagement in the community./    Problem: Impaired Mobility  Mobility: Primary Team Goal: Pt will correctly demonstrate safe sit<>stand  transfers and ambulation using FWW under direct SUPV or touch  assist via  clinical team./Active    Problem: Impaired Self-care Mgmt/ADL/IADL  Self Care: Primary Team Goal: Pt will complete toilet transfers and all  associated tasks with supervision using AD as indicated to ensure safety./    Problem: Safety Risk and Restraint  Safety: Primary Team Goal: Patient will recognized limitations and call for  assistance 100% of the time before getting out of bed./Active    Please review Integrated Patient View Care Plan Flowsheet for Team identified  Problems, Interventions, and Goals.    Signed by: Tracey Harries, RN 04/20/2020 12:51:00 AM

## 2020-04-20 NOTE — Progress Notes (Signed)
PHYSICAL MEDICINE AND REHABILITATION  PROGRESS NOTE -- FACE-TO-FACE ENCOUNTER    Date Time: 04/20/20 10:47 AM  Patient Name: Yolanda Cox, Yolanda Cox    Admission date:  04/11/2020    Subjective:     No acute events overnight. Patient feels well today without current complaints. Slept well last night. Denies pain. Had a good therapy session this morning.     Functional Status:     PT:  Pt presents w/ gains in functional transfers, and gait training, requiring only  Touch Assist for safe mobility using FWW. Pt has difficulty performing dual  cognitive task w/ ambulation using FWW, presenting w/ decreased cadence and  shuffling gait upon cognitive recall of grocery items, and requires increased  verbal cuing for shifting COM anteriorly within FWW. Pt initially uses a step-to  pattern dynamic balance and mobility task of stepping over obstacles, w/ no  major LOB noted. However, pt requires steadying and verbal cuing to increase hip  and knee flexion for full clearance of  BLE. Pt performs step-though pattern in  2 subsequent trials w/ no LOB, and verbal cuing for full BLE clearance. Post  part-task training, pt demonstrates mass practice of gait training w/ increased  step length, increased cadence, and  increased step height for more efficient  gait mechanics and decreased fall risk. Pt will continue to benefit from  combined part-task and mass practice of balance and gait training, with a focus  on dual tasks to increase safety and environmental awareness for return to PLOF.    OT:  Pt. was pleasant and cooperative throughout session w/ increased alertness and  able to perform ADL routine slightly faster then previous session. Pt. was  assessed for functional trasnfers STS and SPT initially w/ limited recall of  proper han dplacement but w/ mass practice and min verbal/visual cues initially  pt was abl eto demo 75% carryover and further training would be beneicial to  prepare for d/c home.      SLP:  Breakdowns in  reading/interpretation of visual stimulus appear to be related to  visual deficits vs. L-attention. Although do suspect a mild deficit in this  area, but does not appear to impact her ability to locate items in her  environment. In order to read effectively, pt requires large/dark font. This  will impact her ability to read medicine labels provided by a pharmacy,  therefore will need assistance with this upon d/c home. SR proved to be an  effective memory treatment and would benefit from additional use in future  sessions.    Medications:   Medication reviewed by me:     Scheduled Meds: PRN Meds:   amLODIPine, 10 mg, Oral, Daily  aspirin, 325 mg, Oral, Daily  atorvastatin, 40 mg, Oral, QHS  calcium citrate-vitamin D, 2 tablet, Oral, Daily  folic acid, 1 mg, Oral, Daily  insulin lispro, 1-3 Units, Subcutaneous, QHS  insulin lispro, 1-5 Units, Subcutaneous, TID AC  lactobacillus/streptococcus, 1 capsule, Oral, Daily  levETIRAcetam, 500 mg, Oral, Q12H [facility]  levothyroxine, 75 mcg, Oral, Daily at 0600  multivitamin, 1 tablet, Oral, Daily  sertraline, 50 mg, Oral, Daily  vitamin D, 25 mcg, Oral, Daily        Continuous Infusions:   acetaminophen, 650 mg, Q6H PRN  albuterol-ipratropium, 3 mL, Q6H PRN  butalbital-acetaminophen-caffeine, 1 tablet, Q4H PRN  dextrose, 15 g of glucose, PRN   And  dextrose, 12.5 g, PRN   And  glucagon (rDNA), 1 mg, PRN  magnesium hydroxide, 30 mL,  Daily PRN  melatonin, 3 mg, QHS PRN  ondansetron, 4 mg, Q8H PRN  senna-docusate, 2 tablet, QHS PRN          Medication Review  1. A complete drug regimen review was completed: Yes  2. Were any drug issues found during review?: No      Review of Systems:   A comprehensive review of systems was: No fevers, chills, nausea, vomiting, chest pain, shortness of breath, cough, headache, double vision.  All others negative.    Physical Exam:     Vitals:    04/19/20 1013 04/19/20 1800 04/20/20 0454 04/20/20 0925   BP: 136/81 146/73 121/68 124/69   Pulse: 68 69 61  64   Resp:  17 16 16    Temp:  97.2 F (36.2 C) 97.2 F (36.2 C) 98.4 F (36.9 C)   TempSrc:  Oral Oral Oral   SpO2:  99% 96% 99%   Weight:       Height:           Intake and Output Summary (Last 24 hours) at Date Time    Intake/Output Summary (Last 24 hours) at 04/20/2020 1047  Last data filed at 04/19/2020 1950  Gross per 24 hour   Intake 60 ml   Output 0 ml   Net 60 ml     P.O.: 60 mL (04/19/20 1950)     Urine: 0 mL (04/19/20 1950)           General: WN/WD, in NAD, seen lying in bed  HEENT: NC/AT, moist mucosal membranes, anicteric sclera  Cardiac: regular rate, extremities warm and well perfused  Resp: non-labored breathing, acyanotic  Abdomen: soft, non-tender, non-distended  Neuro: Awake and alert, oriented to person and place, knows the month and year but not day, speech fluent and spontaneous without dysarthria but is slowed, mild proximal weakness but moves extremities with AG strength throughout    Labs:   No results for input(s): GLUCOSEWHOLE in the last 24 hours.    Recent Labs   Lab 04/20/20  0508   WBC 8.85   Hgb 9.6*   Hematocrit 29.6*   Platelets 214        Recent Labs   Lab 04/20/20  0508   Sodium 139   Potassium 3.7   Chloride 109   CO2 22   BUN 18.0   Creatinine 0.7   Calcium 9.1   Glucose 138*             Results     Procedure Component Value Units Date/Time    CBC and differential [[id number]]  (Abnormal) Collected: 04/20/20 0508    Specimen: Blood Updated: 04/20/20 0743     WBC 8.85 x10 3/uL      Hgb 9.6 g/dL      Hematocrit 44.0 %      Platelets 214 x10 3/uL      RBC 3.09 x10 6/uL      MCV 95.8 fL      MCH 31.1 pg      MCHC 32.4 g/dL      RDW 16 %      MPV 10.6 fL      Nucleated RBC 0.0 /100 WBC      Absolute NRBC 0.00 x10 3/uL      Neutrophils 45.7 %      Lymphocytes Automated 15.4 %      Monocytes 29.2 %      Eosinophils Automated 0.3 %      Basophils  Automated 0.8 %      Immature Granulocytes 8.6 %      Neutrophils Absolute 4.05 x10 3/uL      Lymphocytes Absolute Automated 1.36 x10 3/uL       Monocytes Absolute Automated 2.58 x10 3/uL      Eosinophils Absolute Automated 0.03 x10 3/uL      Basophils Absolute Automated 0.07 x10 3/uL      Immature Granulocytes Absolute 0.76 x10 3/uL     Glucose Whole Blood - POCT [[id number]]  (Abnormal) Collected: 04/20/20 0726     Updated: 04/20/20 0741     Whole Blood Glucose POCT 137 mg/dL     GFR [[id number]] Collected: 04/20/20 0508     Updated: 04/20/20 0738     EGFR >60.0    Hemolysis index [[id number]] Collected: 04/20/20 0508     Updated: 04/20/20 0738     Hemolysis Index 7    Basic Metabolic Panel [[id number]]  (Abnormal) Collected: 04/20/20 0508    Specimen: Blood Updated: 04/20/20 0738     Glucose 138 mg/dL      BUN 76.2 mg/dL      Creatinine 0.7 mg/dL      Calcium 9.1 mg/dL      Sodium 831 mEq/L      Potassium 3.7 mEq/L      Chloride 109 mEq/L      CO2 22 mEq/L      Anion Gap 8.0    Glucose Whole Blood - POCT [[id number]]  (Abnormal) Collected: 04/19/20 2101     Updated: 04/19/20 2108     Whole Blood Glucose POCT 196 mg/dL     Glucose Whole Blood - POCT [[id number]]  (Abnormal) Collected: 04/19/20 1704     Updated: 04/19/20 1711     Whole Blood Glucose POCT 267 mg/dL     Glucose Whole Blood - POCT [[id number]]  (Abnormal) Collected: 04/19/20 1143     Updated: 04/19/20 1150     Whole Blood Glucose POCT 242 mg/dL                Rads:   Radiological Procedure reviewed.  Radiology Results (24 Hour)     ** No results found for the last 24 hours. **              Assessment and Plan:     87yo F with prior traumatic brain bleed, NPH, HTN, DM, HLD, hypothyroidism, RA, and osteoporosis presented after syncopal event and was found to have aspiration pneumonia thought to be secondary to seizures    #Rehab  - continue PT/OT/SLP  - 1/13 patient was discussed this morning in team conference, see medilinks notes for details  - 1/20 she was discussed this morning in team conference, see medilinks notes for details    #New onset seizures  - patient with syncopal episode in assisted  living facility and then seizure activity while in ED  - spot EEG negative  - CT head and MRI brain negative for acute intracranial process  - seen by neurology, Dr. Marney Doctor while in acute hospital  - seizure precautions  - continue with keppra 500mg  BID    #Hx of traumatic brain bleed/NPH  - CT and MRI showing chronic changes only; without evidence of hydrocephalus  - does have some cognitive impairments that may be secondary to this prior injury  - SLP will be working with patient on cognition    #Aspiration pneumonia  - thought to be secondary to seizure activity  - was on  zosyn while in acute hospital and transitioned to augmentin to complete 5 day course on admission to rehab, stop date 1/16  - probiotic, incentive spirometer, duonebs PRN  - will need repeat CXR in 4-6 weeks for resolution  - 1/17 patient has completed abx, no current signs/symptoms of infection    #HTN  - amlodipine 10mg   - 1/20 BP at goal    #DM  - HbA1c 7.6  - per daughter the patient's blood sugar elevates when she is stressed and then it returns to normal, decision was made to hold off on starting an oral agent  - continue SSI  - 1/20 blood sugar remains elevated but daughter prefers to not have patient on standing medications    #HLD  - atorvastatin 40mg     #Hypothyroidism  - levothyroxine    #Anemia  - likely secondary to chronic disease  - Hb 10.5 on admission, continue to monitor with intermittent cbc  - 1/20 Hb 9.6    #T7 compression fracture  - age indeterminate with no acute findings on exam, continue to monitor    #Depression  - zoloft 50mg     #Rheumatoid arthritis  - on methotrexate qweekly, will confirm date and dose, likely start after abx completed    #Osteoporosis  - citracal-vitamin D 2 tab daily    #DVT ppx  - ASA 325    #Dispo  EDD 1/25    Patient Active Problem List   Diagnosis    Fall, initial encounter    Seizure after head injury    Debility       Continue comprehensive and intensive inpatient rehab  program, including:   Physical therapy 60-120 min daily, 5-6 times per week, Occupational therapy 60-120 min daily, 5-6 times per week, speech therapy 60-120 min daily, 5-6 times per week, Case management and Rehabilitation nursing    Signed by: Charlestine Massed, MD MD    [facility]    If there are questions or concerns about the content of this note or information contained within the body of this dictation they should be addressed directly with the author for clarification.

## 2020-04-20 NOTE — Rehab Progress Note ([facility]) (Signed)
[name]  MRN: [id number]  Account: [id number]  Session Start: 04/20/2020 1:00:00 PM  Session Stop: 04/20/2020 2:00:00 PM    Total Treatment Minutes: 60.00 Minutes    Occupational Therapy  Inpatient Rehabilitation Progress Note - Brief    Rehab Diagnosis: Closed head injury after a fall, Mild anterior wedge  compression fracture of T7  Demographics:            Age: 34Y            Gender: Female  Rehabilitation Precautions/Restrictions:   Fall precaution, skin precaution, seizure precaution, HOH, impaired vision,  delayed mental processing    SUBJECTIVE  Patient Report: "I am fine today" (in regards to how she was feeling today).  Pain: Patient currently without complaints of pain.    OBJECTIVE    Interventions:       Self Care/Home Management:  Pt recieved supine in bed willing to engage in  therapy session. Handoff from RN completed at start of session.    Bed Mobility (supine->sit at EOB): CGA    ADL Training: While seated at EOB pt donned b/l shoes with CGA for trunk control  and safety. Pt engaged in toileting task at start of session including perineal  cleaning, LB clothing management and t/f <-> toilet (please refer to caretool  section for details regarding pt's level of assistance with these tasks).  Standing at sink x2 mins with CGA for safety, pt completed hand hygiene  (increased time for processing of sequence of task).       Therapeutic Activities:  Standing at raised table pt engaged in table top  puzzle activity with emphasis on standing balance, tolerance and problem  solving. Pt tolerated standing x15 mins with SBA for safety (CGA provided  initially pt progressed to SBA). OT provided Mod cues throughout task to  correctly match the pieces as activity progressed pt noted to demonstrate  increased ease.  Patient was returned to or left in bed. Handoff to nurse completed.  Bed alarm in place and activated.  Oriented to call bell and placed within reach.  Personal items within  reach.  Assistive devices positioned out of reach.  Bed placed in lowest position.  Pain Reassessment: Pain was not reassessed as no pain was reported.    Education Provided:    Education Provided: Activities of daily living. Functional transfers. Plan of  care. Rehab techniques and procedures. compensatory strategies, AE/DME Safety  issues and interventions. Fall protocol.       Audience: Patient.       Mode: Explanation.  Demonstration.       Response: Applied knowledge.  Verbalized understanding.  Needs practice.    ASSESSMENT  Yolanda Cox tolerated therapy session well today and is demonstrating good progress  towards her therapy goals. Improvements noted during ADLs in bathroom today and  during functional transfers. Pt tolerated standing x15 mintues with CGA/SBA for  safety. Her biggest barriers include impaired cognition, slowed processing,  imparied balance, impaired endurance and decreased activity tolerance. Yolanda Cox  benefits from increased time during session for processing and motor planning.  She would continue to benefit from max practice of ADLs/household transfers,  standing balance/tolerance activities and endurance re-training to maximize her  engagement in ADLS and functional transfers.    CARE Tool  Self-Care Functional Assessment    Toileting Hygiene: 04 - Supervision: Helper provides supervision for safety or  verbal cues ONLY  Toileting Equipment:   Toilet  Assistive Device(s):   None  Putting On/Taking Off Footwear: 04 - Supervision: Helper provides supervision  for safety or verbal cues ONLY  Assistive Device(s):   None    Mobility Functional Assessment    Toilet Transfer: 04 - Supervision: Helper provides supervision for safety or  verbal cues ONLY  Assistive Device(s):   None    PLAN  Continued Occupational Therapy is recommended.  Recommended Frequency/Duration/Intensity: 60-120 min/day, 5-6 days/week, for  approx 10-14 days from evaluation on 04/13/2019; 1:1 therapy or group treatment  as  appropriate.  Continued Activities Contributing Toward Care Plan: ADL/IADL retraining, ther  ex, ther act, modalities, DME recommendations, d/c planning, equipment, family  education/training, functional mobility/transfer training, energy conservation,  balance training, fall prevention training, functional activity tolerance  training, NMRE, cognitive retraining, ARMEO, RTI, Bioness    3 Hour Rule Minutes: 60 minutes of OT treatment this session count towards  intensity and duration of therapy requirement. Patient was seen for the full  scheduled time of OT treatment this session.  Therapy Mode Minutes: Individual: 60 minutes.    Signed by: Wells Guiles, OT 04/20/2020 2:00:00 PM

## 2020-04-21 ENCOUNTER — Emergency Department: Payer: Medicare Other

## 2020-04-21 ENCOUNTER — Inpatient Hospital Stay: Payer: Medicare Other

## 2020-04-21 ENCOUNTER — Observation Stay
Admission: EM | Admit: 2020-04-21 | Discharge: 2020-04-23 | Disposition: A | Discharge status: Skilled Nursing Facility | Payer: Medicare Other | Attending: Internal Medicine | Admitting: Internal Medicine

## 2020-04-21 DIAGNOSIS — R531 Weakness: Secondary | ICD-10-CM

## 2020-04-21 DIAGNOSIS — Z8673 Personal history of transient ischemic attack (TIA), and cerebral infarction without residual deficits: Secondary | ICD-10-CM | POA: Insufficient documentation

## 2020-04-21 DIAGNOSIS — M069 Rheumatoid arthritis, unspecified: Secondary | ICD-10-CM | POA: Insufficient documentation

## 2020-04-21 DIAGNOSIS — I6782 Cerebral ischemia: Secondary | ICD-10-CM | POA: Insufficient documentation

## 2020-04-21 DIAGNOSIS — A419 Sepsis, unspecified organism: Secondary | ICD-10-CM | POA: Diagnosis present

## 2020-04-21 DIAGNOSIS — Z7982 Long term (current) use of aspirin: Secondary | ICD-10-CM | POA: Insufficient documentation

## 2020-04-21 DIAGNOSIS — M810 Age-related osteoporosis without current pathological fracture: Secondary | ICD-10-CM | POA: Insufficient documentation

## 2020-04-21 DIAGNOSIS — A4189 Other specified sepsis: Principal | ICD-10-CM | POA: Insufficient documentation

## 2020-04-21 DIAGNOSIS — M797 Fibromyalgia: Secondary | ICD-10-CM | POA: Insufficient documentation

## 2020-04-21 DIAGNOSIS — I10 Essential (primary) hypertension: Secondary | ICD-10-CM | POA: Insufficient documentation

## 2020-04-21 DIAGNOSIS — E785 Hyperlipidemia, unspecified: Secondary | ICD-10-CM | POA: Insufficient documentation

## 2020-04-21 DIAGNOSIS — E039 Hypothyroidism, unspecified: Secondary | ICD-10-CM | POA: Insufficient documentation

## 2020-04-21 DIAGNOSIS — E119 Type 2 diabetes mellitus without complications: Secondary | ICD-10-CM | POA: Insufficient documentation

## 2020-04-21 DIAGNOSIS — A084 Viral intestinal infection, unspecified: Secondary | ICD-10-CM | POA: Insufficient documentation

## 2020-04-21 DIAGNOSIS — R561 Post traumatic seizures: Secondary | ICD-10-CM

## 2020-04-21 DIAGNOSIS — Z20822 Contact with and (suspected) exposure to covid-19: Secondary | ICD-10-CM | POA: Insufficient documentation

## 2020-04-21 LAB — COVID-19 (SARS-COV-2) & INFLUENZA  A/B, NAA (ROCHE LIAT)
Influenza A: NOT DETECTED
Influenza B: NOT DETECTED
SARS CoV 2 Overall Result: NOT DETECTED

## 2020-04-21 LAB — CBC AND DIFFERENTIAL
Absolute NRBC: 0 10*3/uL (ref 0.00–0.00)
Absolute NRBC: 0.03 10*3/uL — ABNORMAL HIGH (ref 0.00–0.00)
Basophils Absolute Automated: 0.08 10*3/uL (ref 0.00–0.08)
Basophils Absolute Automated: 0.08 10*3/uL (ref 0.00–0.08)
Basophils Automated: 0.4 %
Basophils Automated: 0.3 %
Eosinophils Absolute Automated: 0.01 10*3/uL (ref 0.00–0.44)
Eosinophils Absolute Automated: 0.01 10*3/uL (ref 0.00–0.44)
Eosinophils Automated: 0 %
Eosinophils Automated: 0 %
Hematocrit: 34.4 % — ABNORMAL LOW (ref 34.7–43.7)
Hematocrit: 33.6 % — ABNORMAL LOW (ref 34.7–43.7)
Hgb: 11.2 g/dL — ABNORMAL LOW (ref 11.4–14.8)
Hgb: 10.9 g/dL — ABNORMAL LOW (ref 11.4–14.8)
Immature Granulocytes Absolute: 1.08 10*3/uL — ABNORMAL HIGH (ref 0.00–0.07)
Immature Granulocytes Absolute: 1.16 10*3/uL — ABNORMAL HIGH (ref 0.00–0.07)
Immature Granulocytes: 4.8 %
Immature Granulocytes: 4.3 %
Lymphocytes Absolute Automated: 1.21 10*3/uL (ref 0.42–3.22)
Lymphocytes Absolute Automated: 1.05 10*3/uL (ref 0.42–3.22)
Lymphocytes Automated: 5.4 %
Lymphocytes Automated: 3.9 %
MCH: 30.5 pg (ref 25.1–33.5)
MCH: 31.1 pg (ref 25.1–33.5)
MCHC: 32.6 g/dL (ref 31.5–35.8)
MCHC: 32.4 g/dL (ref 31.5–35.8)
MCV: 93.7 fL (ref 78.0–96.0)
MCV: 96 fL (ref 78.0–96.0)
MPV: 10.8 fL (ref 8.9–12.5)
MPV: 10.9 fL (ref 8.9–12.5)
Monocytes Absolute Automated: 3.94 10*3/uL — ABNORMAL HIGH (ref 0.21–0.85)
Monocytes Absolute Automated: 5.49 10*3/uL — ABNORMAL HIGH (ref 0.21–0.85)
Monocytes: 17.7 %
Monocytes: 20.2 %
Neutrophils Absolute: 15.98 10*3/uL — ABNORMAL HIGH (ref 1.10–6.33)
Neutrophils Absolute: 19.33 10*3/uL — ABNORMAL HIGH (ref 1.10–6.33)
Neutrophils: 71.7 %
Neutrophils: 71.3 %
Nucleated RBC: 0 /100 WBC (ref 0.0–0.0)
Nucleated RBC: 0.1 /100 WBC — ABNORMAL HIGH (ref 0.0–0.0)
Platelets: 229 10*3/uL (ref 142–346)
Platelets: 233 10*3/uL (ref 142–346)
RBC: 3.67 10*6/uL — ABNORMAL LOW (ref 3.90–5.10)
RBC: 3.5 10*6/uL — ABNORMAL LOW (ref 3.90–5.10)
RDW: 16 % — ABNORMAL HIGH (ref 11–15)
RDW: 16 % — ABNORMAL HIGH (ref 11–15)
WBC: 22.3 10*3/uL — ABNORMAL HIGH (ref 3.10–9.50)
WBC: 27.12 10*3/uL — ABNORMAL HIGH (ref 3.10–9.50)

## 2020-04-21 LAB — COMPREHENSIVE METABOLIC PANEL
ALT: 15 U/L (ref 0–55)
AST (SGOT): 23 U/L (ref 5–34)
Albumin/Globulin Ratio: 1.2 (ref 0.9–2.2)
Albumin: 4.2 g/dL (ref 3.5–5.0)
Alkaline Phosphatase: 72 U/L (ref 37–106)
Anion Gap: 14 (ref 5.0–15.0)
BUN: 17 mg/dL (ref 7.0–19.0)
Bilirubin, Total: 0.9 mg/dL (ref 0.2–1.2)
CO2: 20 mEq/L — ABNORMAL LOW (ref 22–29)
Calcium: 9.8 mg/dL (ref 7.9–10.2)
Chloride: 104 mEq/L (ref 100–111)
Creatinine: 0.8 mg/dL (ref 0.6–1.0)
Globulin: 3.5 g/dL (ref 2.0–3.6)
Glucose: 172 mg/dL — ABNORMAL HIGH (ref 70–100)
Potassium: 3.5 mEq/L (ref 3.5–5.1)
Protein, Total: 7.7 g/dL (ref 6.0–8.3)
Sodium: 138 mEq/L (ref 136–145)

## 2020-04-21 LAB — BASIC METABOLIC PANEL
Anion Gap: 10 (ref 5.0–15.0)
BUN: 17 mg/dL (ref 7.0–19.0)
CO2: 24 mEq/L (ref 21–29)
Calcium: 10.1 mg/dL (ref 7.9–10.2)
Chloride: 105 mEq/L (ref 100–111)
Creatinine: 0.8 mg/dL (ref 0.4–1.5)
Glucose: 215 mg/dL — ABNORMAL HIGH (ref 70–100)
Potassium: 3.6 mEq/L (ref 3.5–5.1)
Sodium: 139 mEq/L (ref 136–145)

## 2020-04-21 LAB — URINALYSIS REFLEX TO MICROSCOPIC EXAM - REFLEX TO CULTURE
Bilirubin, UA: NEGATIVE
Blood, UA: NEGATIVE
Glucose, UA: 500 — AB
Ketones UA: NEGATIVE
Leukocyte Esterase, UA: NEGATIVE
Nitrite, UA: NEGATIVE
Protein, UR: NEGATIVE
Specific Gravity UA: 1.02 (ref 1.001–1.035)
Urine pH: 5 (ref 5.0–8.0)
Urobilinogen, UA: NORMAL mg/dL (ref 0.2–2.0)

## 2020-04-21 LAB — LACTIC ACID, PLASMA
Lactic Acid: 2.1 mmol/L — ABNORMAL HIGH (ref 0.2–2.0)
Lactic Acid: 0.9 mmol/L (ref 0.2–2.0)

## 2020-04-21 LAB — GFR
EGFR: >60
EGFR: >60

## 2020-04-21 LAB — TROPONIN I: Troponin I: <0.01 ng/mL (ref 0.00–0.05)

## 2020-04-21 LAB — GLUCOSE WHOLE BLOOD - POCT
Whole Blood Glucose POCT: 147 mg/dL — ABNORMAL HIGH (ref 70–100)
Whole Blood Glucose POCT: 208 mg/dL — ABNORMAL HIGH (ref 70–100)
Whole Blood Glucose POCT: 168 mg/dL — ABNORMAL HIGH (ref 70–100)

## 2020-04-21 LAB — HEMOLYSIS INDEX: Hemolysis Index: 1 (ref 0–24)

## 2020-04-21 MED ORDER — ATORVASTATIN CALCIUM 40 MG PO TABS
40.0000 mg | ORAL_TABLET | Freq: Every evening | ORAL | Status: DC
Start: 2020-04-21 — End: 2020-04-23
  Administered 2020-04-21 – 2020-04-22 (×2): 40 mg via ORAL
  Filled 2020-04-21 (×2): qty 1

## 2020-04-21 MED ORDER — LORAZEPAM 2 MG/ML IJ SOLN
1.0000 mg | Freq: Three times a day (TID) | INTRAMUSCULAR | Status: DC | PRN
Start: 2020-04-21 — End: 2020-04-23

## 2020-04-21 MED ORDER — GLUCAGON 1 MG IJ SOLR (WRAP)
1.0000 mg | INTRAMUSCULAR | Status: DC | PRN
Start: 2020-04-21 — End: 2020-04-21

## 2020-04-21 MED ORDER — VANCOMYCIN 1000 MG IN 250 ML NS IVPB VIAL-MATE (CNR)
15.0000 mg/kg | Freq: Once | INTRAVENOUS | Status: AC
Start: 2020-04-21 — End: 2020-04-21
  Administered 2020-04-21: 17:00:00 1000 mg via INTRAVENOUS
  Filled 2020-04-21: qty 250

## 2020-04-21 MED ORDER — DEXTROSE 10 % IV BOLUS
12.5000 g | INTRAVENOUS | Status: DC | PRN
Start: 2020-04-21 — End: 2020-04-21

## 2020-04-21 MED ORDER — RISAQUAD PO CAPS
1.0000 | ORAL_CAPSULE | Freq: Every day | ORAL | Status: DC
Start: 2020-04-22 — End: 2020-04-22
  Administered 2020-04-22: 09:00:00 1 via ORAL
  Filled 2020-04-21: qty 1

## 2020-04-21 MED ORDER — ACETAMINOPHEN 650 MG RE SUPP
650.0000 mg | Freq: Once | RECTAL | Status: AC
Start: 2020-04-21 — End: 2020-04-21
  Administered 2020-04-21: 16:00:00 650 mg via RECTAL
  Filled 2020-04-21: qty 1

## 2020-04-21 MED ORDER — MELATONIN 3 MG PO TABS
3.0000 mg | ORAL_TABLET | Freq: Every evening | ORAL | Status: DC | PRN
Start: 2020-04-21 — End: 2020-04-23

## 2020-04-21 MED ORDER — NALOXONE HCL 0.4 MG/ML IJ SOLN (WRAP)
0.2000 mg | INTRAMUSCULAR | Status: DC | PRN
Start: 2020-04-21 — End: 2020-04-23

## 2020-04-21 MED ORDER — ACETAMINOPHEN 325 MG PO TABS
650.0000 mg | ORAL_TABLET | Freq: Four times a day (QID) | ORAL | Status: DC | PRN
Start: 2020-04-21 — End: 2020-04-23

## 2020-04-21 MED ORDER — INSULIN LISPRO 100 UNIT/ML SC SOLN
1.0000 [IU] | Freq: Three times a day (TID) | SUBCUTANEOUS | Status: DC
Start: 2020-04-22 — End: 2020-04-23
  Administered 2020-04-22: 18:00:00 3 [IU] via SUBCUTANEOUS
  Administered 2020-04-22: 14:00:00 4 [IU] via SUBCUTANEOUS
  Administered 2020-04-23: 14:00:00 3 [IU] via SUBCUTANEOUS
  Filled 2020-04-21: qty 9
  Filled 2020-04-21: qty 12

## 2020-04-21 MED ORDER — GLUCOSE 40 % PO GEL
15.0000 g | ORAL | Status: DC | PRN
Start: 2020-04-21 — End: 2020-04-23

## 2020-04-21 MED ORDER — ENOXAPARIN SODIUM 40 MG/0.4ML SC SOLN
40.0000 mg | Freq: Every day | SUBCUTANEOUS | Status: DC
Start: 2020-04-22 — End: 2020-04-23
  Administered 2020-04-22 – 2020-04-23 (×2): 40 mg via SUBCUTANEOUS
  Filled 2020-04-21 (×2): qty 0.4

## 2020-04-21 MED ORDER — LEVETIRACETAM 500 MG PO TABS
500.0000 mg | ORAL_TABLET | Freq: Two times a day (BID) | ORAL | Status: DC
Start: 2020-04-21 — End: 2020-04-23
  Administered 2020-04-21 – 2020-04-23 (×4): 500 mg via ORAL
  Filled 2020-04-21 (×6): qty 1

## 2020-04-21 MED ORDER — SODIUM CHLORIDE 0.9 % IV MBP
4.5000 g | Freq: Four times a day (QID) | INTRAVENOUS | Status: DC
Start: 2020-04-22 — End: 2020-04-21

## 2020-04-21 MED ORDER — PIPERACILLIN SOD-TAZOBACTAM SO 4.5 (4-0.5) G IV SOLR
4.5000 g | Freq: Once | INTRAVENOUS | Status: AC
Start: 2020-04-21 — End: 2020-04-21
  Administered 2020-04-21: 16:00:00 4.5 g via INTRAVENOUS
  Filled 2020-04-21: qty 20

## 2020-04-21 MED ORDER — ASPIRIN 325 MG PO TABS
325.0000 mg | ORAL_TABLET | Freq: Every day | ORAL | Status: DC
Start: 2020-04-22 — End: 2020-04-23
  Administered 2020-04-22 – 2020-04-23 (×2): 325 mg via ORAL
  Filled 2020-04-21 (×3): qty 1

## 2020-04-21 MED ORDER — DEXTROSE 10 % IV BOLUS
12.5000 g | INTRAVENOUS | Status: DC | PRN
Start: 2020-04-21 — End: 2020-04-23

## 2020-04-21 MED ORDER — INSULIN LISPRO 100 UNIT/ML SC SOLN
1.0000 [IU] | Freq: Every evening | SUBCUTANEOUS | Status: DC
Start: 2020-04-21 — End: 2020-04-23
  Administered 2020-04-22: 21:00:00 1 [IU] via SUBCUTANEOUS
  Filled 2020-04-21: qty 3

## 2020-04-21 MED ORDER — GLUCOSE 40 % PO GEL
15.0000 g | ORAL | Status: DC | PRN
Start: 2020-04-21 — End: 2020-04-21

## 2020-04-21 MED ORDER — SERTRALINE HCL 50 MG PO TABS
50.0000 mg | ORAL_TABLET | Freq: Every day | ORAL | Status: DC
Start: 2020-04-22 — End: 2020-04-23
  Administered 2020-04-22 – 2020-04-23 (×2): 50 mg via ORAL
  Filled 2020-04-21 (×2): qty 1

## 2020-04-21 MED ORDER — SODIUM CHLORIDE 0.9 % IV SOLN
INTRAVENOUS | Status: DC
Start: 2020-04-21 — End: 2020-04-23

## 2020-04-21 MED ORDER — ONDANSETRON HCL 4 MG/2ML IJ SOLN
4.0000 mg | Freq: Three times a day (TID) | INTRAMUSCULAR | Status: DC | PRN
Start: 2020-04-21 — End: 2020-04-23

## 2020-04-21 MED ORDER — FOLIC ACID 1 MG PO TABS
1.0000 mg | ORAL_TABLET | Freq: Every day | ORAL | Status: DC
Start: 2020-04-22 — End: 2020-04-23
  Administered 2020-04-22 – 2020-04-23 (×2): 1 mg via ORAL
  Filled 2020-04-21 (×2): qty 1

## 2020-04-21 MED ORDER — LEVOTHYROXINE SODIUM 75 MCG PO TABS
75.0000 ug | ORAL_TABLET | Freq: Every day | ORAL | Status: DC
Start: 2020-04-22 — End: 2020-04-23
  Administered 2020-04-22 – 2020-04-23 (×2): 75 ug via ORAL
  Filled 2020-04-21 (×2): qty 1

## 2020-04-21 MED ORDER — VITAMIN D 25 MCG (1000 UT) PO TABS
25.0000 ug | ORAL_TABLET | Freq: Every day | ORAL | Status: DC
Start: 2020-04-22 — End: 2020-04-23
  Administered 2020-04-22 – 2020-04-23 (×2): 25 ug via ORAL
  Filled 2020-04-21 (×2): qty 1

## 2020-04-21 MED ORDER — ALBUTEROL-IPRATROPIUM 2.5-0.5 (3) MG/3ML IN SOLN
3.0000 mL | Freq: Four times a day (QID) | RESPIRATORY_TRACT | Status: DC | PRN
Start: 2020-04-21 — End: 2020-04-23

## 2020-04-21 MED ORDER — GLUCAGON 1 MG IJ SOLR (WRAP)
1.0000 mg | INTRAMUSCULAR | Status: DC | PRN
Start: 2020-04-21 — End: 2020-04-23

## 2020-04-21 MED ORDER — SODIUM CHLORIDE 0.9 % IV BOLUS
30.0000 mL/kg | Freq: Once | INTRAVENOUS | Status: AC
Start: 2020-04-21 — End: 2020-04-21
  Administered 2020-04-21: 16:00:00 2214 mL via INTRAVENOUS

## 2020-04-21 MED ORDER — ACETAMINOPHEN 500 MG PO TABS
1000.0000 mg | ORAL_TABLET | Freq: Once | ORAL | Status: DC
Start: 2020-04-21 — End: 2020-04-21

## 2020-04-21 MED ORDER — ONDANSETRON HCL 4 MG/2ML IJ SOLN
4.0000 mg | Freq: Once | INTRAMUSCULAR | Status: AC
Start: 2020-04-21 — End: 2020-04-21
  Administered 2020-04-21: 16:00:00 4 mg via INTRAVENOUS
  Filled 2020-04-21: qty 2

## 2020-04-21 MED ORDER — AMLODIPINE BESYLATE 5 MG PO TABS
10.0000 mg | ORAL_TABLET | Freq: Every day | ORAL | Status: DC
Start: 2020-04-22 — End: 2020-04-23
  Administered 2020-04-22 – 2020-04-23 (×2): 10 mg via ORAL
  Filled 2020-04-21 (×2): qty 2

## 2020-04-21 MED ORDER — SODIUM CHLORIDE 0.9 % IV MBP
4.5000 g | Freq: Four times a day (QID) | INTRAVENOUS | Status: DC
Start: 2020-04-21 — End: 2020-04-21

## 2020-04-21 NOTE — Plan of Care (Signed)
Patient was doing well this morning, but then later in the afternoon the patient was found to have altered mental status. At baseline is alert and talkative, but she was only able to say her name. She then had an episode of vomiting. Labs, urine, and CXR were ordered. CXR was able to be done, but prior to being able to collect labs or urine the patient began to have several more episodes of vomiting and was not responsive to voice. Temperature was found to be >102. We are sending patient to the emergency department via 911. Patient's daughter was at bedside and was updated on the plan.    Gillis Santa, MD  Please utilize epic chat for any questions.

## 2020-04-21 NOTE — Progress Notes (Signed)
Nutrition: 04/21/2020   Reason for assessment:  Length of stay     Per chart documentation, patient typically eats 75% of meals.  Chart reviewed.  RD will continue to follow-up per standards of care.     Annalee Genta, RDN, CSP, CDCES  Spectra: [phone]  [address] office: [phone]

## 2020-04-21 NOTE — Progress Notes (Signed)
History of Presenting Illness:   Yolanda Cox is a 24 Yolanda Cox.o. 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  She lives at [facility] community in [place].  The day of admission on 04/09/2020 she felt nauseated and she tried to walk to the dining area to seek help but passed that before she got there.  She was taken to [facility] emergency department and while there she had a tonic-clonic seizure.  A CT scan of the head showed sequelae of microvascular disease but no acute abnormalities.  CT scan of the spine showed no acute lesions.  She was started on Keppra, and was treated with Zosyn for possible aspiration pneumonia.  She was discharged to acute rehab center on 04/12/2019.    ______________________________________________________________________         Principal Problem:  Debility    Active Problems:  Patient Active Problem List   Diagnosis    Fall, initial encounter    Seizure after head injury    Debility             Interval History:  Afebrile.  Stable.  Results 04/20/2020:  BBC 8.85, H&H 9.6 and 26.6, platelets 214.  Blood glucose this morning is 147.  Blood glucose yesterday [phone].  BUN and creatinine 18 and 0.7, electrolytes are normal.    Medications:  Vas, aspirin, Lipitor, Citracal plus D, lispro per low-dose sliding scale, lactobacillus, Keppra,synthroid, multivitamins, vitamin D.    Physical Exam:  Temp:  [98.6 F (37 C)] 98.6 F (37 C)  Heart Rate:  [64-68] 68  Resp Rate:  [15-16] 16  BP: (121-129)/(62-68) 122/62        Assessment:   Hyperglycemia> that hemoglobin A1c was 7.6 in December 2021.  Daughter reported that she becomes hypoglycemic when in crisis but recovers later.  Hypoglycemic agents were not started at the time.      Plan  The risk of hypoglycemia appears to be greater than running a blood glucose in a range of 130 to  220.  Metformin at a dose of 500 mg  twice daily may be considered.      _______________________________________________________________________    Labs    Recent Labs   Lab 04/20/20  0508   WBC 8.85   RBC 3.09*   Hgb 9.6*   Hematocrit 29.6*   Glucose 138*   BUN 18.0   Creatinine 0.7   Calcium 9.1   Sodium 139   Potassium 3.7   Chloride 109   CO2 22                    Last 24 Hour Radiology:  Radiology results reviewed        _  Last 24 Hour Intake/Output:    Intake/Output Summary (Last 24 hours) at 04/21/2020 1026  Last data filed at 04/21/2020 0827  Gross per 24 hour   Intake 340 ml   Output 0 ml   Net 340 ml                 Scheduled Meds:  Current Facility-Administered Medications   Medication Dose Route Frequency    amLODIPine  10 mg Oral Daily    aspirin  325 mg Oral Daily    atorvastatin  40 mg Oral QHS    calcium citrate-vitamin D  2 tablet Oral Daily    folic acid  1 mg Oral Daily  insulin lispro  1-3 Units Subcutaneous QHS    insulin lispro  1-5 Units Subcutaneous TID AC    lactobacillus/streptococcus  1 capsule Oral Daily    levETIRAcetam  500 mg Oral Q12H [facility]    levothyroxine  75 mcg Oral Daily at 0600    multivitamin  1 tablet Oral Daily    sertraline  50 mg Oral Daily    vitamin D  25 mcg Oral Daily               PRN Meds:  acetaminophen, albuterol-ipratropium, butalbital-acetaminophen-caffeine, Nursing communication: Adult Hypoglycemia Treatment Algorithm **AND** dextrose **AND** dextrose **AND** glucagon (rDNA), magnesium hydroxide, melatonin, ondansetron, senna-docusate    Continuous Infusions:              LDA Flowsheet:  Patient Lines/Drains/Airways Status     Active PICC Line / CVC Line / PIV Line / Drain / Airway / Intraosseous Line / Epidural Line / ART Line / Line / Wound / Pressure Ulcer / NG/OG Tube     None                  Safety Checklist:     DVT prophylaxis:  CHEST guideline (See page [id number])  history of nontraumatic intracranial hemorrhage.   Foley: Not present   IVs:  None   PT/OT: Ordered     Disposition:      Today's date: 04/21/2020  Anticipated medical stability for discharge:04/26/2020  Service status: Inpatinet  Reason for ongoing hospitalization: Acute rehab  Anticipated discharge needs: Home PT/OT                     _

## 2020-04-21 NOTE — Rehab Progress Note (Medilinks) (Addendum)
Yolanda Cox  MRN: [id number]  Account: [id number]  Session Start: 04/21/2020 12:00:00 AM  Session Stop: 04/21/2020 12:00:00 AM    Total Treatment Minutes:  Minutes    Rehabilitation Nursing  Inpatient Rehabilitation Shift Assessment    Rehab Diagnosis: Closed head injury after a fall, Mild anterior wedge  compression fracture of T7  Demographics:            Age: 35Y            Gender: Female  Primary Language: English    Date of Onset:  04/09/2020  Date of Admission: 04/11/2020 5:40:00 PM    Rehabilitation Precautions Restrictions:   Fall precaution, skin precaution, seizure precaution, HOH, impaired vision,  delayed mental processing    Patient Report: " Am good "  Patient/Caregiver Goals:  "to get better"    Wounds/Incisions: See Epic    Medication Review: No clinically significant medication issues identified this  shift.    Bowel and Bladder Output:                       Bladder (# only)             Bowel (# only)  Number of Episodes  Continent            2                            1    Additional Education Provided:    Education Provided: Precautions. Pain management. Pain scale. Medication  options. Side effects. Plan of care. Plan of care. Safety issues and  interventions. Fall protocol. Supervision requirements. Use of adaptive devices.  Safety issues and interventions. Skin/wound care. Signs/symptoms of infection.  Prevention of skin breakdown. Activities of daily living. Bed mobility.  Functional transfers. Safety. Signs /T/ Symptoms associated with debility  Diabetes. Insulin drawing/mixing/administration. Medication. Glucose testing.  Hypoglycemia causes/symptoms/treatment. Medication. Name and dosage.  Administration. Purpose. Side Effects.       Audience: Patient.       Mode: Explanation.  Demonstration.       Response: Applied knowledge.  Verbalized understanding.  Needs reinforcement.    TEAM CARE PLAN  Identified problems from team documentation:  Problem: Impaired Cognition  Cognition:  Primary Team Goal: Pt will answer orientation and biographical  questions with >90% accuracy given min cuing in order to improve memory./    Problem: Impaired Leisure Skills  Leisure Skills: Primary Team Goal: Pt and caregiver will describe opportunities  and resources for social engagement in the community./    Problem: Impaired Mobility  Mobility: Primary Team Goal: Pt will correctly demonstrate safe sit<>stand  transfers and ambulation using FWW under direct SUPV or touch assist via  clinical team./Active    Problem: Impaired Self-care Mgmt/ADL/IADL  Self Care: Primary Team Goal: Pt will complete toilet transfers and all  associated tasks with supervision using AD as indicated to ensure safety./    Problem: Safety Risk and Restraint  Safety: Primary Team Goal: Pt. will recognize limitations and call for  assistance before attempting mobility 100% of the time in order to prevent fall  while in rehab/Active    Please review Integrated Patient View Care Plan Flowsheet for Team identified  Problems, Interventions, and Goals.    Signed by: Jiles Prows, RN 04/21/2020 12:21:00 AM

## 2020-04-21 NOTE — Rehab Progress Note (Medilinks) (Signed)
[name]  MRN: [id number]  Account: [id number]  Session Start: 04/21/2020 1:00:00 PM  Session Stop: 04/21/2020 1:20:00 PM    Total Treatment Minutes: 20.00 Minutes    Speech Language Pathology  Inpatient Rehabilitation Treatment Note    Rehab Diagnosis: Closed head injury after a fall, Mild anterior wedge  compression fracture of T7  Demographics:            Age: 15Y            Gender: Female  Rehabilitation Precautions/Restrictions:   Fall precaution, skin precaution, seizure precaution, HOH, impaired vision,  delayed mental processing    Interventions:       Speech Treatment: Pt greeted bedside reporting feeling cold despite having  multiple blankets. Vitals checked which revealed 101 temperature and 184/72 BP.  Additionally, pt demonstrating difficulty remaining arousal, often closing her  eyes. RN/MD notified. and labs/imaging ordered. Daughter present for family  training, therefore brief education provided regarding need for 24-7 supervision  upon d/c home. Pt unable to express understanding d/t limited arousal; however  daughter was agreeable to recommendation and stated she would work on getting  that level of supervision taken care of.    Patient was returned to or left in bed. Handoff to nurse completed.  Bed alarm in place and activated.  Oriented to call bell and placed within reach.  Personal items within reach.  Assistive devices positioned out of reach.  Bed placed in lowest position.    ASSESSMENT  Full family training session was not completed due to pt's change in medical  status. Recommend additional training to educate further on d/c recommendations  and cognitive function.    3 Hour Rule Minutes: 20 minutes of SLP treatment this session count towards  intensity and duration of therapy requirement. Patient was not seen for the full  scheduled time of SLP treatment this session. change in medical status  Will attempt to see patient when medically cleared/off hold.  Therapy Mode Minutes:  Individual: 20 minutes.    Signed by: Mikael Spray, CCC/SLP 04/21/2020 1:20:00 PM

## 2020-04-21 NOTE — ED Notes (Signed)
Bed: S 14  Expected date:   Expected time:   Means of arrival:   Comments:  Medic 413

## 2020-04-21 NOTE — H&P (Signed)
ADMISSION HISTORY AND PHYSICAL EXAM    Date Time: 04/21/20 11:41 PM  Patient Name: Yolanda Cox  Attending Physician: Sonia Baller, DO  Primary Care Physician: Shelbie Proctor, MD    CC: transfer from [facility] rehab for fever, sepsis, altered mental status            Assessment/Plan:     # fever, SIRS  # recent seizure d/p  # hx CNS bleed  # hx NPH  # DM2  # hx RA on methotrexate  # HTN    -- no sourse of infection evident- has isolated fever and WBC but hemodynamically stable thereore would not empirically treat until determine a source  -- will send respiratory pathogen panel  -- if develops diarrhea check Cdiff  -- follow up blood cultures  -- fu labs in a.m.  -- observation for now possible [place] back to rehab pending infectious workup  -- continue keppra  -- accuchecks, SSI  -- would hold MTX until formal infection ruled out  -- PT/OT        History of Presenting Illness:   Yolanda Cox is a 87 y.o. female with prior traumatic brain bleed, NPH, HTN, DM, HLD, hypothyroidism, RA, and osteoporosis, recent admit to [facility] for seizure vs syncope and [place] to [facility] few days ago, transferred from rehab for altered mental status, sepsis, noted to have increased WBC to 21 and fever to 101.2, also noted to be febrile to 101.2. more somnolent today. was empirically given zosyn and transferred to ED, where given additional vanco zosyn. Noted to have WBC 27 otherwise labs unremarkable. No diarrhea, cough, n/vtg, cellulitis or focal symptoms    Past Medical History:     Past Medical History:   Diagnosis Date    Anemia     Fibromyalgia     Hyperlipidemia     hypothyroid     Osteoporosis     Rheumatoid     TIA (transient ischemic attack)        Available old records reviewed    Past Surgical History:     Past Surgical History:   Procedure Laterality Date    CHOLECYSTECTOMY         Family History:   No pertinent hx    Social History:     Social History     Tobacco Use   Smoking Status Former Smoker    Smokeless Tobacco Not on file     Social History     Substance and Sexual Activity   Alcohol Use Not Currently     Social History     Substance and Sexual Activity   Drug Use Never       Allergies:     Allergies   Allergen Reactions    Biaxin [Clarithromycin] Rash    Zithromax [Azithromycin] Rash       Medications:     Home Medications     Med List Status: In Progress Set By: Rory Percy, RN at 04/21/2020  3:17 PM                albuterol-ipratropium (DUO-NEB) 2.5-0.5(3) mg/3 mL nebulizer     Take 3 mLs by nebulization every 6 (six) hours as needed (Shortness of breath or wheezing)     amLODIPine (NORVASC) 10 MG tablet     Take 1 tablet (10 mg total) by mouth daily     aspirin 325 MG tablet     Take 325 mg by mouth daily  atorvastatin (LIPITOR) 40 MG tablet     Take 40 mg by mouth daily     butalbital-acetaminophen-caffeine (FIORICET) 50-325-40 MG per tablet     Take 1 tablet by mouth every 4 (four) hours as needed for Headaches     calcium carbonate (OS-CAL) 600 MG Tab tablet     Take 600 mg by mouth daily     folic acid (FOLVITE) 1 MG tablet     Take 1 mg by mouth daily     guaiFENesin (ROBITUSSIN) 100 MG/5ML Solution     Take 10 mLs (200 mg total) by mouth every 4 (four) hours as needed (cough)     insulin lispro (HumaLOG) 100 UNIT/ML injection     If BG >180 add the following: 181 - 200 - 2 unit 201 - 250 - 3 unit 251 - 300 - 4 unit 301 - 350 - 5 unit >350 give additional 10 unit     lactobacillus/streptococcus (RISAQUAD) Cap     Take 1 capsule by mouth daily     levETIRAcetam (KEPPRA) 500 MG tablet     Take 1 tablet (500 mg total) by mouth every 12 (twelve) hours     levothyroxine (SYNTHROID) 75 MCG tablet     Take 75 mcg by mouth Once a day at 6:00am     methotrexate 2.5 MG tablet     Take by mouth once a week     Multiple Vitamin (multivitamin) capsule     Take 1 capsule by mouth daily     Omega-3 Fatty Acids (OMEGA-3 FISH OIL PO)     Take 1,000 mg by mouth daily     sertraline (ZOLOFT) 50 MG tablet      Take 50 mg by mouth daily     silver sulfADIAZINE (SILVADENE) 1 % cream     Apply topically daily     vitamin D (cholecalciferol) 25 MCG (1000 UT) tablet     Take 25 mcg by mouth daily              Review of Systems:   All other systems were reviewed and are negative except: per HPI    Physical Exam:     Patient Vitals for the past 24 hrs:   BP Temp Temp src Pulse Resp SpO2 Height Weight   04/21/20 2316 101/51 98.9 F (37.2 C) Oral 60 15 94 % -- --   04/21/20 1933 114/57 97.9 F (36.6 C) Oral 72 17 94 % -- --   04/21/20 1900 113/53 -- -- 74 18 93 % -- --   04/21/20 1830 111/54 -- -- 72 15 94 % -- --   04/21/20 1800 115/58 99.1 F (37.3 C) Oral 74 15 95 % -- --   04/21/20 1704 -- 99.1 F (37.3 C) Oral -- -- -- -- --   04/21/20 1630 109/54 -- -- 75 13 94 % -- --   04/21/20 1600 122/61 -- -- 72 (!) 0 98 % -- --   04/21/20 1529 -- (!) 101.2 F (38.4 C) Rectal -- -- -- -- --   04/21/20 1513 141/64 99.5 F (37.5 C) Oral 84 20 95 % 1.651 m (5\' 5" ) 73.8 kg (162 lb 11.2 oz)     Body mass index is 27.07 kg/m.    Intake/Output Summary (Last 24 hours) at 04/21/2020 2341  Last data filed at 04/21/2020 0827  Gross per 24 hour   Intake 140 ml   Output 0 ml   Net 140  ml       General: awake, alert, oriented x 3; no acute distress.  HEENT: perrla, eomi, sclera anicteric  oropharynx clear without lesions, mucous membranes moist  Neck: supple, no lymphadenopathy, no thyromegaly, no JVD, no carotid bruits  Cardiovascular: regular rate and rhythm, no murmurs, rubs or gallops  Lungs: clear to auscultation bilaterally, without wheezing, rhonchi, or rales  Abdomen: soft, non-tender, non-distended; no palpable masses, no hepatosplenomegaly, normoactive bowel sounds, no rebound or guarding  Extremities: no clubbing, cyanosis, or edema  Neuro: cranial nerves grossly intact, strength 5/5 in upper and lower extremities, sensation intact,   Skin: no rashes or lesions noted        Labs:     Results     Procedure Component Value Units  Date/Time    Glucose Whole Blood - POCT [[id number]]  (Abnormal) Collected: 04/21/20 2215     Updated: 04/21/20 2218     Whole Blood Glucose POCT 168 mg/dL     Lactic Acid [[id number]] Collected: 04/21/20 2114    Specimen: Blood Updated: 04/21/20 2125     Lactic Acid 0.9 mmol/L     Culture Blood Aerobic and Anaerobic [[id number]] Collected: 04/21/20 1528    Specimen: Blood, Venipuncture Updated: 04/21/20 1904    Narrative:      The order will result in two separate 8-17ml bottles  Please do NOT order repeat blood cultures if one has been  drawn within the last 48 hours  UNLESS concerned for  endocarditis  AVOID BLOOD CULTURE DRAWS FROM CENTRAL LINE IF POSSIBLE  Indications:->Sepsis  1 BLUE+1 PURPLE    Culture Blood Aerobic and Anaerobic [[id number]] Collected: 04/21/20 1552    Specimen: Blood, Venipuncture Updated: 04/21/20 1904    Narrative:      The order will result in two separate 8-71ml bottles  Please do NOT order repeat blood cultures if one has been  drawn within the last 48 hours  UNLESS concerned for  endocarditis  AVOID BLOOD CULTURE DRAWS FROM CENTRAL LINE IF POSSIBLE  Indications:->Sepsis  1 BLUE+1 PURPLE    Urinalysis Reflex to Microscopic Exam- Reflex to Culture [[id number]]  (Abnormal) Collected: 04/21/20 1753     Updated: 04/21/20 1825     Urine Type Urine, Clean Ca     Color, UA Yellow     Clarity, UA Clear     Specific Gravity UA 1.020     Urine pH 5.0     Leukocyte Esterase, UA Negative     Nitrite, UA Negative     Protein, UR Negative     Glucose, UA 500     Ketones UA Negative     Urobilinogen, UA Normal mg/dL      Bilirubin, UA Negative     Blood, UA Negative    COVID-19 (SARS-CoV-2) and Influenza A/B, NAA [[id number]] Collected: 04/21/20 1528    Specimen: Culturette from Nasopharyngeal Updated: 04/21/20 1738     Purpose of COVID testing Diagnostic -PUI     SARS-CoV-2 Specimen Source Nasal Swab     SARS CoV 2 Overall Result Not Detected     Influenza A Not Detected     Influenza B Not Detected     Narrative:      o Collect and clearly label specimen type:  o PREFERRED-Upper respiratory specimen: One Nasal Swab in  Transport Media.  o Hand deliver to laboratory ASAP  Diagnostic -PUI    Troponin I [[id number]] Collected: 04/21/20 1528     Updated: 04/21/20 1700  Troponin I <0.01 ng/mL     Comprehensive metabolic panel [[id number]]  (Abnormal) Collected: 04/21/20 1528    Specimen: Blood Updated: 04/21/20 1654     Glucose 172 mg/dL      BUN 40.9 mg/dL      Creatinine 0.8 mg/dL      Sodium 811 mEq/L      Potassium 3.5 mEq/L      Chloride 104 mEq/L      CO2 20 mEq/L      Calcium 9.8 mg/dL      Protein, Total 7.7 g/dL      Albumin 4.2 g/dL      AST (SGOT) 23 U/L      ALT 15 U/L      Alkaline Phosphatase 72 U/L      Bilirubin, Total 0.9 mg/dL      Globulin 3.5 g/dL      Albumin/Globulin Ratio 1.2     Anion Gap 14.0    GFR [[id number]] Collected: 04/21/20 1528     Updated: 04/21/20 1654     EGFR >60.0    CBC and differential [[id number]]  (Abnormal) Collected: 04/21/20 1528    Specimen: Blood Updated: 04/21/20 1624     WBC 27.12 x10 3/uL      Hgb 10.9 g/dL      Hematocrit 46.9 %      Platelets 233 x10 3/uL      RBC 3.50 x10 6/uL      MCV 96.0 fL      MCH 31.1 pg      MCHC 32.4 g/dL      RDW 16 %      MPV 10.9 fL      Neutrophils 71.3 %      Lymphocytes Automated 3.9 %      Monocytes 20.2 %      Eosinophils Automated 0.0 %      Basophils Automated 0.3 %      Immature Granulocytes 4.3 %      Nucleated RBC 0.1 /100 WBC      Neutrophils Absolute 19.33 x10 3/uL      Lymphocytes Absolute Automated 1.05 x10 3/uL      Monocytes Absolute Automated 5.49 x10 3/uL      Eosinophils Absolute Automated 0.01 x10 3/uL      Basophils Absolute Automated 0.08 x10 3/uL      Immature Granulocytes Absolute 1.16 x10 3/uL      Absolute NRBC 0.03 x10 3/uL     Lactic Acid [[id number]]  (Abnormal) Collected: 04/21/20 1528    Specimen: Blood Updated: 04/21/20 1609     Lactic Acid 2.1 mmol/L           Imaging personally reviewed, including:     CT  Head WO Contrast [[id number]] Collected: 04/21/20 1625     Order Status: Completed Updated: 04/21/20 1629    Narrative:     HISTORY: Pain after trauma.     COMPARISON: CT of the brain dated 04/08/2020.     TECHNIQUE: Noncontrast CT of the head. The following dose reduction   techniques were utilized: automatic exposure control and/or adjustment   of mA and/or kV according to patient size, and the use of iterative   reconstruction technique.     FINDINGS: There is soft tissue swelling and hematoma of the posterior   left scalp. The underlying calvarium appears intact. There are   hypoattenuating areas within the subcortical and periventricular white   matter which are nonspecific in nature but likely  reflect chronic small   vessel ischemic disease in a patient of this age. There is no evidence   of acute territorial infarction or intracranial hemorrhage. The   ventricles and sulci are proportionately prominent, reflecting diffuse   cerebral volume loss. There is no mass-effect or midline shift. No   extra-axial collections are seen. Intracranial vascular calcifications   are seen.. There are bilateral lens implants. Mild mucosal thickening is   seen within the paranasal sinuses.     Impression:     No acute intracranial abnormality. Diffuse cerebral volume   loss and chronic small vessel ischemic changes.     Georgann Housekeeper, MD   04/21/2020 4:26 PM    XR Chest AP Portable [[id number]] Collected: 04/21/20 1558    Order Status: Completed Updated: 04/21/20 1601    Narrative:     CLINICAL HISTORY: Acute mental status change     COMPARISON: 04/21/2020 at 1:56 PM     FINDINGS: Single AP portable view of the chest was exposed. Heart and   mediastinal structures remain stable. There is a pacing device overlying   the heart. There is atelectasis and/or patchy consolidation at the left   lung base which is a stable finding. No pleural effusion or pneumothorax   is identified. Visualized bones are unremarkable.      Impression:     Stable appearances of the chest. Atelectasis or patchy   consolidation at the left lung bases remain stable.          Signed by: Darcel Smalling, MD, MD   cc:Ro, Saunders Glance, MD

## 2020-04-21 NOTE — Progress Notes (Signed)
CASE MANAGEMENT PROGRESS NOTE      Patient: Yolanda Cox (87 y.o. female)  Admission Date: 04/11/2020 [facility] Day 10)    Active Hospital Problems    Diagnosis    Debility       Length of stay: Hospital Day 10    Disposition: Home     Discharge plan: The patient will transition back to her apartment at the [facility] once she ready for discharge. CM has discussed supervision support needs with the patient's daughter.    She was schedule to start family training this afternoon with PT/OT and SLP starting at 1:00PM.     Anticipated date of discharge: Tuesday 04/25/20    Barriers to discharge: Patient needs to continued intensive therapy.       Ms. Dallie Piles, MSW   Rehabilitation Case Manager  [facility]   [address]   [place], [place] [address]  [place] [phone]

## 2020-04-21 NOTE — Progress Notes (Signed)
PHYSICAL MEDICINE AND REHABILITATION  PROGRESS NOTE -- FACE-TO-FACE ENCOUNTER    Date Time: 04/21/20 10:24 AM  Patient Name: Yolanda Cox, Yolanda Cox    Admission date:  04/11/2020    Subjective:     No acute events overnight. Patient feels well today. Happy she was able to play cards with TR this morning. No current complaints.    Functional Status:     PT:  Pt ambulates w/ decreased gait speed during dual cog tasks 2/2 to problem  solving deficits. During all gait training activities, pt benefits from verbal  and visual cues to anteriorly shift COM to front of FWW, and to increase step  length and step height for increased foot clearance during swing-phase. Pt  demonstrates appropriate BL weight shift and trunk extension while reaching for  objects outside of COM, however, requires verbal cuing to place FWW as close to  target as possible before initiating reach. During side-stepping intervention,  pt presents w/ decreased lateral step length and height 2/2 decreased strength  of BL hip abductors and decreased dynamic balance. Recommending dynamic standing  balance training in multiple planes to increase SLS time for improved gait  mechanics and to increase safety and functional mobility.    OT:  Yolanda Cox tolerated therapy session well today and is demonstrating good progress  towards her therapy goals. Improvements noted during ADLs in bathroom today and  during functional transfers. Pt tolerated standing x15 mintues with CGA/SBA for  safety. Her biggest barriers include impaired cognition, slowed processing,  imparied balance, impaired endurance and decreased activity tolerance. Yolanda Cox  benefits from increased time during session for processing and motor planning.  She would continue to benefit from max practice of ADLs/household transfers,  standing balance/tolerance activities and endurance re-training to maximize her  engagement in ADLS and functional transfers.      SLP:  Breakdowns in reading/interpretation  of visual stimulus appear to be related to  visual deficits vs. L-attention. Although do suspect a mild deficit in this  area, but does not appear to impact her ability to locate items in her  environment. In order to read effectively, pt requires large/dark font. This  will impact her ability to read medicine labels provided by a pharmacy,  therefore will need assistance with this upon d/c home. SR proved to be an  effective memory treatment and would benefit from additional use in future  sessions.    Medications:   Medication reviewed by me:     Scheduled Meds: PRN Meds:   amLODIPine, 10 mg, Oral, Daily  aspirin, 325 mg, Oral, Daily  atorvastatin, 40 mg, Oral, QHS  calcium citrate-vitamin D, 2 tablet, Oral, Daily  folic acid, 1 mg, Oral, Daily  insulin lispro, 1-3 Units, Subcutaneous, QHS  insulin lispro, 1-5 Units, Subcutaneous, TID AC  lactobacillus/streptococcus, 1 capsule, Oral, Daily  levETIRAcetam, 500 mg, Oral, Q12H [facility]  levothyroxine, 75 mcg, Oral, Daily at 0600  multivitamin, 1 tablet, Oral, Daily  sertraline, 50 mg, Oral, Daily  vitamin D, 25 mcg, Oral, Daily        Continuous Infusions:   acetaminophen, 650 mg, Q6H PRN  albuterol-ipratropium, 3 mL, Q6H PRN  butalbital-acetaminophen-caffeine, 1 tablet, Q4H PRN  dextrose, 15 g of glucose, PRN   And  dextrose, 12.5 g, PRN   And  glucagon (rDNA), 1 mg, PRN  magnesium hydroxide, 30 mL, Daily PRN  melatonin, 3 mg, QHS PRN  ondansetron, 4 mg, Q8H PRN  senna-docusate, 2 tablet, QHS PRN  Medication Review  1. A complete drug regimen review was completed: Yes  2. Were any drug issues found during review?: No      Review of Systems:   A comprehensive review of systems was: No fevers, chills, nausea, vomiting, chest pain, shortness of breath, cough, headache, double vision.  All others negative.    Physical Exam:     Vitals:    04/20/20 0925 04/20/20 1635 04/21/20 0545 04/21/20 0824   BP: 124/69 121/68 129/68 122/62   Pulse: 64 64 68    Resp: 16 15 16      Temp: 98.4 F (36.9 C) 98.6 F (37 C) 98.6 F (37 C)    TempSrc: Oral Oral Oral    SpO2: 99% 98% 96%    Weight:       Height:           Intake and Output Summary (Last 24 hours) at Date Time    Intake/Output Summary (Last 24 hours) at 04/21/2020 1024  Last data filed at 04/21/2020 0827  Gross per 24 hour   Intake 340 ml   Output 0 ml   Net 340 ml     P.O.: 140 mL (04/21/20 0827)     Urine: 0 mL (04/21/20 0827)           General: WN/WD, in NAD, seen lying in bed  HEENT: NC/AT, moist mucosal membranes, anicteric sclera  Cardiac: regular rate, extremities warm and well perfused  Resp: non-labored breathing, acyanotic  Abdomen: soft, non-tender, non-distended  Neuro: Awake and alert, oriented to person and place, knows the month and year but not day, speech fluent and spontaneous without dysarthria but is slowed, mild proximal weakness but moves extremities with AG strength throughout    Labs:   No results for input(s): GLUCOSEWHOLE in the last 24 hours.    Recent Labs   Lab 04/20/20  0508   WBC 8.85   Hgb 9.6*   Hematocrit 29.6*   Platelets 214        Recent Labs   Lab 04/20/20  0508   Sodium 139   Potassium 3.7   Chloride 109   CO2 22   BUN 18.0   Creatinine 0.7   Calcium 9.1   Glucose 138*             Results     Procedure Component Value Units Date/Time    Glucose Whole Blood - POCT [[id number]]  (Abnormal) Collected: 04/21/20 0732     Updated: 04/21/20 0737     Whole Blood Glucose POCT 147 mg/dL     Glucose Whole Blood - POCT [[id number]]  (Abnormal) Collected: 04/20/20 2043     Updated: 04/20/20 2046     Whole Blood Glucose POCT 226 mg/dL     Glucose Whole Blood - POCT [[id number]]  (Abnormal) Collected: 04/20/20 1634     Updated: 04/20/20 1704     Whole Blood Glucose POCT 221 mg/dL     Glucose Whole Blood - POCT [[id number]]  (Abnormal) Collected: 04/20/20 1145     Updated: 04/20/20 1148     Whole Blood Glucose POCT 208 mg/dL                Rads:   Radiological Procedure reviewed.  Radiology Results (24 Hour)      ** No results found for the last 24 hours. **              Assessment and Plan:     87yo F with  prior traumatic brain bleed, NPH, HTN, DM, HLD, hypothyroidism, RA, and osteoporosis presented after syncopal event and was found to have aspiration pneumonia thought to be secondary to seizures    #Rehab  - continue PT/OT/SLP  - 1/13 patient was discussed this morning in team conference, see medilinks notes for details  - 1/20 she was discussed this morning in team conference, see [facility]il[facility] notes for details    #New onset seizures  - patient with syncopal episode in assisted living facility and then seizure activity while in ED  - spot EEG negative  - CT head and MRI brain negative for acute intracranial process  - seen by neurology, Dr. Marney Doctor while in acute hospital  - seizure precautions  - continue with keppra 500mg  BID    #Hx of traumatic brain bleed/NPH  - CT and MRI showing chronic changes only; without evidence of hydrocephalus  - does have some cognitive impairments that may be secondary to this prior injury  - SLP will be working with patient on cognition    #Aspiration pneumonia  - thought to be secondary to seizure activity  - was on zosyn while in acute hospital and transitioned to augmentin to complete 5 day course on admission to rehab, stop date 1/16  - probiotic, incentive spirometer, duonebs PRN  - will need repeat CXR in 4-6 weeks for resolution  - 1/17 patient has completed abx, no current signs/symptoms of infection    #HTN  - amlodipine 10mg   - 1/21 BP at goal    #DM  - HbA1c 7.6  - per daughter the patient's blood sugar elevates when she is stressed and then it returns to normal, decision was made to hold off on starting an oral agent  - continue SSI  - 1/21 blood sugar remains elevated but daughter prefers to not have patient on standing medications    #HLD  - atorvastatin 40mg     #Hypothyroidism  - levothyroxine    #Anemia  - likely secondary to chronic disease  - Hb 10.5 on admission,  continue to monitor with intermittent cbc  - 1/20 Hb 9.6    #T7 compression fracture  - age indeterminate with no acute findings on exam, continue to monitor    #Depression  - zoloft 50mg     #Rheumatoid arthritis  - on methotrexate qweekly, can restart after d/c from rehab    #Osteoporosis  - citracal-vitamin D 2 tab daily    #DVT ppx  - ASA 325    #Dispo  EDD 1/25    Patient Active Problem List   Diagnosis    Fall, initial encounter    Seizure after head injury    Debility       Continue comprehensive and intensive inpatient rehab program, including:   Physical therapy 60-120 min daily, 5-6 times per week, Occupational therapy 60-120 min daily, 5-6 times per week, speech therapy 60-120 min daily, 5-6 times per week, Case management and Rehabilitation nursing    Signed by: Charlestine Massed, MD MD    [facility]    If there are questions or concerns about the content of this note or information contained within the body of this dictation they should be addressed directly with the author for clarification.

## 2020-04-21 NOTE — Rehab Progress Note ([facility]) (Signed)
Yolanda Cox  MRN: [id number]  Account: [id number]  Session Start: 04/20/2020 8:00:00 AM  Session Stop: 04/20/2020 9:00:00 AM    Total Treatment Minutes: 60.00 Minutes    Speech Language Pathology  Inpatient Rehabilitation Progress Note - Brief    Rehab Diagnosis: Closed head injury after a fall, Mild anterior wedge  compression fracture of T7  Demographics:            Age: 26Y            Gender: Female  Rehabilitation Precautions/Restrictions:   Fall precaution, skin precaution, seizure precaution, HOH, impaired vision,  delayed mental processing    SUBJECTIVE  Patient Report: "I think I did it by myself" re: level of assistance needed for  ADLs (toileting)  Pain: Patient currently without complaints of pain.    OBJECTIVE    Interventions:       Speech Treatment: Patient greeted bedside reporting need for restroom. SLP  assisted in toileting to assess safety awareness and problem solving/sequencing.   Patient exhibited difficulty staying within parameters of walker, and required  min-mod verbal cuing to safely utilize walker. Additionally, patient required  max verbal cueing to problem solve (turn on bathroom lights and open bathroom  door wide enough to allow for entrance).  SLP guided patient through memory training to increase recall.  Patient able to  recall one name independently, and one name given a phonemic cue. Given access  to external memory aid (list of therapists), patient able to recall all  therapist's names. SLP counseled patient on level of supervision required to  maintain safety in the home post d/c. Patient with poor awareness of support  level needed to complete ADLs post d/c. Patient guided through written steps for  sit to stand sequence to increase safety post d/c. Given mass repetition and  visual demonstration, patient able to recall sit-stand sequence with min verbal  prompting.  Patient was returned to or left in bed. Handoff to nurse completed.  Bed alarm in place and  activated.  Oriented to call bell and placed within reach.  Personal items within reach.  Assistive devices positioned out of reach.  Bed placed in lowest position.      Assessment and treatment was provided under the 1:1 direct supervision of Mikael Spray, [place], CCC-SLP.    Pain Reassessment: Pain was not reassessed as no pain was reported.    Education Provided:    Education Provided: Plan of care. Cognitive functioning. Compensatory cognitive  strategies/aids       Audience: Patient.       Mode: Explanation.       Response: Demonstrated skill.  Needs practice.  Needs reinforcement.    ASSESSMENT  Patient continues to need cuing for safety during functional mobility tasks,  therefore will continue to need supervision upon discharge home.  Patient with  good carryover of memory strategies introduced in previous sessions. Continues  to benefit from mass repetition and access to external memory aids (eg  calendars, list of steps for ADLs), to support memory and orientation.        PLAN  Continued Speech Language Pathology is recommended to address:  Recommended Frequency/Duration/Intensity: 60-120 minutes 1-2x day, 5-6x week,  for 10-14 days  Continued Activities Contributing Toward Care Plan: dynamic assessment,  neuro-cognitive retraining, memory strategies, finance/medication management  exercises, external aids, brain injury education, family training, and d/c  planning    3 Hour Rule Minutes: 60 minutes of SLP treatment this session count towards  intensity and duration of therapy requirement. Patient was seen for the full  scheduled time of SLP treatment this session.  Therapy Mode Minutes: Individual: 60 minutes.    Signed by: Sherald Hess, SLP Graduate Student 04/20/2020 9:00:00 AM     - CoSigned By: Mikael Spray, CCC/SLP 04/21/2020 1:51:24 PM

## 2020-04-21 NOTE — ED Provider Notes (Signed)
[facility] EMERGENCY DEPARTMENT RESIDENT H&P       CLINICAL INFORMATION        HPI:      Chief Complaint: Altered Mental Status  .    Yolanda Cox is a 87 y.o. female with history of hyperlipidemia, osteoarthritis, and recent TBI who presents with altered mental status.  Patient was seen by daughter this morning, who noted that the patient appeared more altered than typical, not responding appropriately, appearing more somnolent.  Patient has notably been in inpatient rehab following a fall with subsequent seizures 10 days ago, seen here.  At that time, her head CT was negative, and she has been inpatient rehab facility ever since.  Full review of systems not elicited secondary to patient's altered mental status.    History obtained from: EMS, review of prior chart             ROS:      Review of Systems   Unable to perform ROS: Mental status change         Physical Exam:      Pulse 84   BP 141/64   Resp 20   SpO2 95 %   Temp 99.5 F (37.5 C)    Physical Exam  Vitals and nursing note reviewed.   Constitutional:       General: She is not in acute distress.     Appearance: Normal appearance. She is well-developed. She is not toxic-appearing.   HENT:      Head: Normocephalic and atraumatic.      Right Ear: External ear normal.      Left Ear: External ear normal.      Nose: Nose normal.      Mouth/Throat:      Mouth: Mucous membranes are moist.      Pharynx: Oropharynx is clear.   Eyes:      General: No scleral icterus.     Extraocular Movements: Extraocular movements intact.      Conjunctiva/sclera: Conjunctivae normal.      Pupils: Pupils are equal, round, and reactive to light. Pupils are equal.   Cardiovascular:      Rate and Rhythm: Normal rate and regular rhythm.      Heart sounds: Murmur heard.       Pulmonary:      Effort: Pulmonary effort is normal. No respiratory distress.      Breath sounds: Normal breath sounds.   Abdominal:      General: Abdomen is flat. There is no  distension.      Palpations: Abdomen is soft.      Tenderness: There is no abdominal tenderness.   Musculoskeletal:         General: No swelling or tenderness. Normal range of motion.      Cervical back: Normal range of motion and neck supple. No rigidity.   Skin:     General: Skin is warm and dry.      Capillary Refill: Capillary refill takes more than 3 seconds.      Coloration: Skin is not jaundiced.   Neurological:      Mental Status: She is oriented to person, place, and time. Mental status is at baseline. She is lethargic.      Cranial Nerves: No cranial nerve deficit.      Motor: No weakness.   Psychiatric:         Behavior: Behavior normal.  PAST HISTORY        Primary Care Provider: Shelbie Proctor, MD        PMH/PSH:    .     Past Medical History:   Diagnosis Date    Anemia     Fibromyalgia     Hyperlipidemia     hypothyroid     Osteoporosis     Rheumatoid     TIA (transient ischemic attack)        She has a past surgical history that includes Cholecystectomy.      Social/Family History:      She reports that she has quit smoking. She does not have any smokeless tobacco history on file. She reports previous alcohol use. She reports that she does not use drugs.    History reviewed. No pertinent family history.      Listed Medications on Arrival:    .     Home Medications     Med List Status: In Progress Set By: Rory Percy, RN at 04/21/2020  3:17 PM                albuterol-ipratropium (DUO-NEB) 2.5-0.5(3) mg/3 mL nebulizer     Take 3 mLs by nebulization every 6 (six) hours as needed (Shortness of breath or wheezing)     amLODIPine (NORVASC) 10 MG tablet     Take 1 tablet (10 mg total) by mouth daily     aspirin 325 MG tablet     Take 325 mg by mouth daily     atorvastatin (LIPITOR) 40 MG tablet     Take 40 mg by mouth daily     butalbital-acetaminophen-caffeine (FIORICET) 50-325-40 MG per tablet     Take 1 tablet by mouth every 4 (four) hours as needed for Headaches     calcium carbonate  (OS-CAL) 600 MG Tab tablet     Take 600 mg by mouth daily     folic acid (FOLVITE) 1 MG tablet     Take 1 mg by mouth daily     guaiFENesin (ROBITUSSIN) 100 MG/5ML Solution     Take 10 mLs (200 mg total) by mouth every 4 (four) hours as needed (cough)     insulin lispro (HumaLOG) 100 UNIT/ML injection     If BG >180 add the following: 181 - 200 - 2 unit 201 - 250 - 3 unit 251 - 300 - 4 unit 301 - 350 - 5 unit >350 give additional 10 unit     lactobacillus/streptococcus (RISAQUAD) Cap     Take 1 capsule by mouth daily     levETIRAcetam (KEPPRA) 500 MG tablet     Take 1 tablet (500 mg total) by mouth every 12 (twelve) hours     levothyroxine (SYNTHROID) 75 MCG tablet     Take 75 mcg by mouth Once a day at 6:00am     methotrexate 2.5 MG tablet     Take by mouth once a week     Multiple Vitamin (multivitamin) capsule     Take 1 capsule by mouth daily     Omega-3 Fatty Acids (OMEGA-3 FISH OIL PO)     Take 1,000 mg by mouth daily     sertraline (ZOLOFT) 50 MG tablet     Take 50 mg by mouth daily     silver sulfADIAZINE (SILVADENE) 1 % cream     Apply topically daily     vitamin D (cholecalciferol) 25 MCG (1000 UT) tablet  Take 25 mcg by mouth daily         Allergies: She is allergic to biaxin [clarithromycin] and zithromax [azithromycin].            VISIT INFORMATION        Reassessments/Clinical Course:      This is an 87 year old female with history as above presenting with altered mental status in the setting of recent inpatient rehab, TBI, and fever measured by EMS.  Patient remains hemodynamically stable, concern for possible infectious source given the patient's fever as well as recent contact with healthcare.  Other differential diagnoses include worsening TBI symptoms, brain bleed, urinary tract infection.  Plan is for sepsis labs, repeat head CT, EKG, troponin, will cover with broad-spectrum antibiotics, anticipate admission.            Conversations with Other Providers:              Medications Given in the  ED:    .     ED Medication Orders (From admission, onward)    Start Ordered     Status Ordering Provider    04/21/20 1540 04/21/20 1539  vancomycin (VANCOCIN) 1000 mg in NS 250 mL IVPB vial-mate  Once        Route: Intravenous  Ordered Dose: 15 mg/kg     Acknowledged Baxter Hire    04/21/20 1539 04/21/20 1539  piperacillin-tazobactam (ZOSYN) injection 4.5 g  Once        Route: Intravenous  Ordered Dose: 4.5 g     Last MAR action: Given Baxter Hire    04/21/20 1527 04/21/20 1526  acetaminophen (TYLENOL) suppository 650 mg  Once        Route: Rectal  Ordered Dose: 650 mg     Last MAR action: Given Baxter Hire    04/21/20 1523 04/21/20 1522  sodium chloride 0.9 % bolus 2,214 mL  Once        Route: Intravenous  Ordered Dose: 30 mL/kg     Last [facility] action: New [address] Baxter Hire    04/21/20 1523 04/21/20 1522    Once        Route: Oral  Ordered Dose: 1,000 mg     Discontinued Baxter Hire    04/21/20 1523 04/21/20 1522  ondansetron (ZOFRAN) injection 4 mg  Once        Route: Intravenous  Ordered Dose: 4 mg     Last MAR action: Given Baxter Hire    04/21/20 1500 04/21/20 1422  [MAR Hold - Suspended Admission]  piperacillin-tazobactam (ZOSYN) 4.5 g in sodium chloride 0.9 % 100 mL IVPB mini-bag plus  Every 6 hours   (MAR Hold at [facility] Acute Inpatient Rehabilitation - A Service of [facility] since Fri 04/21/2020 at 1451.Hold Reason: Patient on Leave of Absence.)   Route: Intravenous  Ordered Dose: 4.5 g     Last MAR action: Automatically Held Gillis Santa A    04/21/20 1415 04/21/20 1333  [MAR Hold - Suspended Admission]  0.9% NaCl infusion  Continuous   (MAR Hold at [facility] Acute Inpatient Rehabilitation - A Service of [facility] since Fri 04/21/2020 at 1451.Hold Reason: Patient on Leave of Absence.)   Route: Intravenous     Last MAR action: MAR Hold STRATTON, Luverna Degenhart A    04/12/20 0900 04/11/20 1745  [MAR Hold - Suspended Admission]  amLODIPine  (NORVASC) tablet 10 mg  Daily   (MAR Hold at [facility] Acute  Inpatient Rehabilitation - A Service of [facility] since Fri 04/21/2020 at 1451.Hold Reason: Patient on Leave of Absence.)   Route: Oral  Ordered Dose: 10 mg     Last MAR action: Automatically Held Gillis Santa A    04/12/20 0900 04/11/20 1745  [MAR Hold - Suspended Admission]  aspirin tablet 325 mg  Daily   (MAR Hold at [facility] Acute Inpatient Rehabilitation - A Service of [facility] since Fri 04/21/2020 at 1451.Hold Reason: Patient on Leave of Absence.)   Route: Oral  Ordered Dose: 325 mg     Last MAR action: Automatically Held Gillis Santa A    04/12/20 0900 04/11/20 1745  [MAR Hold - Suspended Admission]  calcium citrate-vitamin D (CITRACAL+D) 315-250 MG-UNIT per tablet 2 tablet  Daily   (MAR Hold at [facility] Acute Inpatient Rehabilitation - A Service of [facility] since Fri 04/21/2020 at 1451.Hold Reason: Patient on Leave of Absence.)   Route: Oral  Ordered Dose: 2 tablet     Last MAR action: Automatically Held Gillis Santa A    04/12/20 0900 04/11/20 1745  [MAR Hold - Suspended Admission]  folic acid (FOLVITE) tablet 1 mg  Daily   (MAR Hold at [facility] Acute Inpatient Rehabilitation - A Service of [facility] since Fri 04/21/2020 at 1451.Hold Reason: Patient on Leave of Absence.)   Route: Oral  Ordered Dose: 1 mg     Last MAR action: Automatically Held Gillis Santa A    04/12/20 0900 04/11/20 1745  [MAR Hold - Suspended Admission]  lactobacillus/streptococcus (RISAQUAD) capsule 1 capsule  Daily   (MAR Hold at [facility] Acute Inpatient Rehabilitation - A Service of [facility] since Fri 04/21/2020 at 1451.Hold Reason: Patient on Leave of Absence.)   Route: Oral  Ordered Dose: 1 capsule     Last MAR action: Automatically Held Gillis Santa A    04/12/20 0900 04/11/20 1745  [MAR Hold - Suspended Admission]  sertraline (ZOLOFT) tablet 50 mg  Daily   (MAR Hold at [facility] Acute Inpatient  Rehabilitation - A Service of [facility] since Fri 04/21/2020 at 1451.Hold Reason: Patient on Leave of Absence.)   Route: Oral  Ordered Dose: 50 mg     Last MAR action: Automatically Held Gillis Santa A    04/12/20 0900 04/11/20 1745  [MAR Hold - Suspended Admission]  vitamin D (cholecalciferol) tablet 25 mcg  Daily   (MAR Hold at [facility] Acute Inpatient Rehabilitation - A Service of [facility] since Fri 04/21/2020 at 1451.Hold Reason: Patient on Leave of Absence.)   Route: Oral  Ordered Dose: 25 mcg     Last MAR action: Automatically Held Gillis Santa A    04/12/20 0900 04/11/20 1745  [MAR Hold - Suspended Admission]  multivitamin tablet 1 tablet  Daily   (MAR Hold at [facility] Acute Inpatient Rehabilitation - A Service of [facility] since Fri 04/21/2020 at 1451.Hold Reason: Patient on Leave of Absence.)   Route: Oral  Ordered Dose: 1 tablet     Last MAR action: Automatically Held Gillis Santa A    04/12/20 0600 04/11/20 1745  [MAR Hold - Suspended Admission]  levothyroxine (SYNTHROID) tablet 75 mcg  Daily at 0600   (MAR Hold at [facility] Acute Inpatient Rehabilitation - A Service of [facility] since Fri 04/21/2020 at 1451.Hold Reason: Patient on Leave of Absence.)   Route: Oral  Ordered Dose: 75 mcg     Last MAR action: Automatically Held Laurel, Jonny Ruiz A  04/11/20 2200 04/11/20 1745  [MAR Hold - Suspended Admission]  atorvastatin (LIPITOR) tablet 40 mg  At bedtime   (MAR Hold at [facility] Acute Inpatient Rehabilitation - A Service of [facility] since Fri 04/21/2020 at 1451.Hold Reason: Patient on Leave of Absence.)   Route: Oral  Ordered Dose: 40 mg     Last MAR action: Automatically Held Gillis Santa A    04/11/20 2200 04/11/20 1745  [MAR Hold - Suspended Admission]  insulin lispro (HumaLOG) injection 1-3 Units  At bedtime   (MAR Hold at [facility] Acute Inpatient Rehabilitation - A Service of [facility] since Fri 04/21/2020 at 1451.Hold Reason:  Patient on Leave of Absence.)   Route: Subcutaneous  Ordered Dose: 1-3 Units    "And" Linked Group Details    Last MAR action: Automatically Held Gillis Santa A    04/11/20 2100 04/11/20 1745  [MAR Hold - Suspended Admission]  levETIRAcetam (KEPPRA) tablet 500 mg  Every 12 hours scheduled   (MAR Hold at [facility] Acute Inpatient Rehabilitation - A Service of [facility] since Fri 04/21/2020 at 1451.Hold Reason: Patient on Leave of Absence.)   Route: Oral  Ordered Dose: 500 mg     Last MAR action: Automatically Held Gillis Santa A    04/11/20 1830 04/11/20 1745  [MAR Hold - Suspended Admission]  insulin lispro (HumaLOG) injection 1-5 Units  3 times daily before meals   (MAR Hold at [facility] Acute Inpatient Rehabilitation - A Service of [facility] since Fri 04/21/2020 at 1451.Hold Reason: Patient on Leave of Absence.)   Route: Subcutaneous  Ordered Dose: 1-5 Units    "And" Linked Group Details    Last MAR action: Automatically Held Gillis Santa A    04/11/20 1745 04/11/20 1745  [MAR Hold - Suspended Admission]  dextrose (GLUCOSE) 40 % oral gel 15 g of glucose  As needed   (MAR Hold at [facility] Acute Inpatient Rehabilitation - A Service of [facility] since Fri 04/21/2020 at 1451.Hold Reason: Patient on Leave of Absence.)   Route: Oral  Ordered Dose: 15 g of glucose    "And" Linked Group Details    Last MAR action: MAR Hold Gillis Santa A    04/11/20 1745 04/11/20 1745  [MAR Hold - Suspended Admission]  dextrose 50 % bolus 12.5 g  As needed   (MAR Hold at [facility] Acute Inpatient Rehabilitation - A Service of [facility] since Fri 04/21/2020 at 1451.Hold Reason: Patient on Leave of Absence.)   Route: Intravenous  Ordered Dose: 12.5 g    "And" Linked Group Details    Last MAR action: MAR Hold Gillis Santa A    04/11/20 1745 04/11/20 1745  [MAR Hold - Suspended Admission]  glucagon (rDNA) (GLUCAGEN) injection 1 mg  As needed   (MAR Hold at [facility] Acute Inpatient  Rehabilitation - A Service of [facility] since Fri 04/21/2020 at 1451.Hold Reason: Patient on Leave of Absence.)   Route: Intramuscular  Ordered Dose: 1 mg    "And" Linked Group Details    Last MAR action: MAR Hold Gillis Santa A    04/11/20 1745 04/11/20 1745  [MAR Hold - Suspended Admission]  ondansetron (ZOFRAN-ODT) disintegrating tablet 4 mg  Every 8 hours PRN   (MAR Hold at [facility] Acute Inpatient Rehabilitation - A Service of [facility] since Fri 04/21/2020 at 1451.Hold Reason: Patient on Leave of Absence.)   Route: Oral  Ordered Dose: 4 mg  Last MAR action: MAR Hold Gillis Santa A    04/11/20 1745 04/11/20 1745  [MAR Hold - Suspended Admission]  melatonin tablet 3 mg  At bedtime PRN   (MAR Hold at [facility] Acute Inpatient Rehabilitation - A Service of [facility] since Fri 04/21/2020 at 1451.Hold Reason: Patient on Leave of Absence.)   Route: Oral  Ordered Dose: 3 mg     Last MAR action: MAR Hold STRATTON, Matha Masse A    04/11/20 1745 04/11/20 1745  [MAR Hold - Suspended Admission]  senna-docusate (PERICOLACE) 8.6-50 MG per tablet 2 tablet  At bedtime PRN   (MAR Hold at [facility] Acute Inpatient Rehabilitation - A Service of [facility] since Fri 04/21/2020 at 1451.Hold Reason: Patient on Leave of Absence.)   Route: Oral  Ordered Dose: 2 tablet     Last MAR action: MAR Hold Gillis Santa A    04/11/20 1745 04/11/20 1745  [MAR Hold - Suspended Admission]  magnesium hydroxide (MILK OF MAGNESIA) 400 MG/5ML suspension 30 mL  Daily PRN   (MAR Hold at [facility] Acute Inpatient Rehabilitation - A Service of [facility] since Fri 04/21/2020 at 1451.Hold Reason: Patient on Leave of Absence.)   Route: Oral  Ordered Dose: 30 mL     Last MAR action: MAR Hold STRATTON, Deionna Marcantonio A    04/11/20 1745 04/11/20 1745  [MAR Hold - Suspended Admission]  albuterol-ipratropium (DUO-NEB) 2.5-0.5(3) mg/3 mL nebulizer 3 mL  RT - Every 6 hours as needed   (MAR Hold at [facility] Acute Inpatient  Rehabilitation - A Service of [facility] since Fri 04/21/2020 at 1451.Hold Reason: Patient on Leave of Absence.)   Route: Nebulization  Ordered Dose: 3 mL     Last MAR action: MAR Hold STRATTON, Ajanee Buren A    04/11/20 1745 04/11/20 1745  [MAR Hold - Suspended Admission]  butalbital-acetaminophen-caffeine (FIORICET) 50-325-40 MG per tablet 1 tablet  Every 4 hours PRN   (MAR Hold at [facility] Acute Inpatient Rehabilitation - A Service of [facility] since Fri 04/21/2020 at 1451.Hold Reason: Patient on Leave of Absence.)   Route: Oral  Ordered Dose: 1 tablet     Last MAR action: MAR Hold Gillis Santa A    04/11/20 1745 04/11/20 1745  [MAR Hold - Suspended Admission]  acetaminophen (TYLENOL) tablet 650 mg  Every 6 hours PRN   (MAR Hold at [facility] Acute Inpatient Rehabilitation - A Service of [facility] since Fri 04/21/2020 at 1451.Hold Reason: Patient on Leave of Absence.)   Route: Oral  Ordered Dose: 650 mg     Last MAR action: MAR Hold Gillis Santa A            Procedures:      Procedures      Assessment/Plan:                    Baxter Hire, MD  Resident  04/21/20 1623       Baxter Hire, MD  Resident  04/21/20 [phone]

## 2020-04-21 NOTE — Rehab Progress Note ([facility]) (Signed)
Yolanda Cox  MRN: [id number]  Account: [id number]  Session Start: 04/21/2020 2:00:00 PM  Session Stop: 04/21/2020 2:05:00 PM    Total Treatment Minutes:  Minutes    Occupational Therapy  Inpatient Rehabilitation Exception Note    Patient was unable to complete planned therapy session.  0 minutes of OT treatment this session count towards intensity and duration of  therapy requirement. Patient was not seen for the full scheduled time of OT  treatment this session. Pt not appropriate for therapy at this time on medical  hold secondary to fever, chills, vomiting. being transported to ED via 911.  Will attempt to see patient when medically cleared/off hold.    Signed by: Margretta Sidle, OTR/L 04/21/2020 2:05:00 PM

## 2020-04-21 NOTE — ED Provider Notes (Signed)
Date Time: 04/21/20 3:38 PM   Patient Name: Yolanda Cox   Attending Physician: Dr. Collene Mares, M.D.    Attending Note:   I have reviewed and agree with the history. The pertinent physical exam has been documented.  Selected historical findings: 87 y.o. female p/w altered mental status. Pt was seen at outside facility on 01/08 after a syncopal episode resulting in a fall with head trauma. Pt then had a witnessed episode of seizure activity in the ED. Head CT negative at that time. Today, daughter noticed pt to be altered from baseline, prompting return to ED today. Per EMS, pt was somnolent and febrile at 102.26F.   Selected physical examination findings: T 101 HD stable AOx1, slow speech.  Neck completely supple.  RRR CTAB Abd soft, NT.  Grip 5/5 B, plantarflexion/dorsiflexion 5/5 B.  No rash  Assessment: Fever, sepsis  Plan: IV abx, admit    EKG -             interpreted by me: normal sinus rhythm at rate 83, diffuse T wave flattening including T wave inversions in V4-V6, no clear ST segment changes. Impression: nonspecific EKG     Critical Care Time (not including procedures): 30-74 minutes. 36 minutes   Due to the high risk of critical illness or multi-organ failure at initial presentation and/or during ED course.    System(s) at risk for compromise:  circulatory, respiratory and CNS  Critical Diagnosis:   1. Sepsis         The patient was Hypotensive:   No     The patient was Hypoxic:   No     This does not including time spent performing other reported procedures or services.   Critical care time involved full attention to the patient's condition and included:   Review of nursing notes and/or old charts - Yes  Documentation time - Yes  Care, transfer of care, and discharge plans - Yes  Obtaining necessary history from family, EMS, nursing home staff and/or treating physicians - Yes  Review of medications, allergies, and vital signs - Yes   Consultant collaboration on findings and treatment  options - Yes  Ordering, interpreting, and reviewing diagnostic studies/tab tests - Yes       ____________________________________________________________________    I was acting as a scribe for Collene Mares, M.D. on Yolanda Cox   Treatment Team: Scribe: Yolanda Arenas A.     I am the first provider for this patient and I personally performed the services documented. Treatment Team: Scribe: Yolanda Arenas A. is scribing for me on Yolanda Cox. This note accurately reflects work and decisions made by me.  Collene Mares, M.D.  ____________________________________________________________________        Olevia Bowens, MD  04/25/20 1325

## 2020-04-21 NOTE — ED Notes (Signed)
[facility] HOSPITAL EMERGENCY DEPT  ED NURSING NOTE FOR THE RECEIVING INPATIENT NURSE   ED NURSE Jayel Scaduto/Kelly   [place] [id number]   ED CHARGE RN Marisue Ivan B   ADMISSION INFORMATION   Salote Weidmann is a 87 y.o. female admitted with an ED diagnosis of:    1. Sepsis         Isolation: None   Allergies: Biaxin [clarithromycin] and Zithromax [azithromycin]   Holding Orders confirmed? N/A   Belongings Documented? Yes   Home medications sent to pharmacy confirmed? N/A   NURSING CARE   Patient Comes From:   Mental Status: Acute Care Facility  alert and confused   ADL: Independent with all ADLs   Ambulation: Unable to assess   Pertinent Information  and Safety Concerns: History of hypelipidemia,osteoarthritis, and recent TBI who presents with altered mental status. Presented to ER wiith increased altered mental status, nor responding appropriately, and lethargic but responsive to voice.Febrile.      CT / NIH   CT Head ordered on this patient?  Yes   NIH/Dysphagia assessment done prior to admission? N/A   VITAL SIGNS (at the time of this note)      Vitals:    04/21/20 1800   BP: 115/58   Pulse: 74   Resp: 15   Temp: 99.1 F (37.3 C)   SpO2: 95%

## 2020-04-21 NOTE — Rehab Progress Note ([facility]) (Signed)
Yolanda Cox  MRN: [id number]  Account: [id number]  Session Start: 04/21/2020 3:00:00 PM  Session Stop: 04/21/2020 3:00:00 PM    Total Treatment Minutes:  Minutes    Physical Therapy  Inpatient Rehabilitation Exception Note    Patient was unable to complete planned therapy session.  0 minutes of PT treatment this session count towards intensity and duration of  therapy requirement. Patient was not seen for the full scheduled time of PT  treatment this session.   Pt sent out acutely due to AMS and functional decline with 102 deg F fever to  r/o infection.  Will attempt to see patient when medically cleared/off hold.    Signed by: Juluis Pitch, PT 04/21/2020 3:00:00 PM

## 2020-04-22 LAB — RESPIRATORY PATHOGEN PANEL WITH COVID-19,PCR
Adenovirus: NOT DETECTED
Bordetella parapertussis PCR: NOT DETECTED
Bordetella pertussis: NOT DETECTED
Chlamydophila pneumoniae: NOT DETECTED
Coronavirus 229E: NOT DETECTED
Coronavirus HKU1: NOT DETECTED
Coronavirus NL63: NOT DETECTED
Coronavirus OC43: NOT DETECTED
Human Metapneumovirus: NOT DETECTED
Human Rhinovirus/Enterovirus: NOT DETECTED
Influenza A/H1: NOT DETECTED
Influenza A/H3: NOT DETECTED
Influenza A: NOT DETECTED
Influenza AH1 - 2009: NOT DETECTED
Influenza B: NOT DETECTED
Mycoplasma pneumoniae: NOT DETECTED
Parainfluenza Virus 1: NOT DETECTED
Parainfluenza Virus 2: NOT DETECTED
Parainfluenza Virus 3: NOT DETECTED
Parainfluenza Virus 4: NOT DETECTED
Respiratory Syncytial Virus: NOT DETECTED
SARS CoV 2 Overall Result: NOT DETECTED

## 2020-04-22 LAB — BASIC METABOLIC PANEL
Anion Gap: 8 (ref 5.0–15.0)
BUN: 12 mg/dL (ref 7.0–19.0)
CO2: 22 mEq/L (ref 22–29)
Calcium: 8.6 mg/dL (ref 7.9–10.2)
Chloride: 110 mEq/L (ref 100–111)
Creatinine: 0.7 mg/dL (ref 0.6–1.0)
Glucose: 128 mg/dL — ABNORMAL HIGH (ref 70–100)
Potassium: 3.5 mEq/L (ref 3.5–5.1)
Sodium: 140 mEq/L (ref 136–145)

## 2020-04-22 LAB — CBC AND DIFFERENTIAL
Absolute NRBC: 0 10*3/uL (ref 0.00–0.00)
Basophils Absolute Automated: 0.08 10*3/uL (ref 0.00–0.08)
Basophils Automated: 0.4 %
Eosinophils Absolute Automated: 0.01 10*3/uL (ref 0.00–0.44)
Eosinophils Automated: 0.1 %
Hematocrit: 28 % — ABNORMAL LOW (ref 34.7–43.7)
Hgb: 8.8 g/dL — ABNORMAL LOW (ref 11.4–14.8)
Immature Granulocytes Absolute: 0.81 10*3/uL — ABNORMAL HIGH (ref 0.00–0.07)
Immature Granulocytes: 4.3 %
Lymphocytes Absolute Automated: 1.29 10*3/uL (ref 0.42–3.22)
Lymphocytes Automated: 6.9 %
MCH: 30.4 pg (ref 25.1–33.5)
MCHC: 31.4 g/dL — ABNORMAL LOW (ref 31.5–35.8)
MCV: 96.9 fL — ABNORMAL HIGH (ref 78.0–96.0)
MPV: 10.8 fL (ref 8.9–12.5)
Monocytes Absolute Automated: 4.06 10*3/uL — ABNORMAL HIGH (ref 0.21–0.85)
Monocytes: 21.6 %
Neutrophils Absolute: 12.56 10*3/uL — ABNORMAL HIGH (ref 1.10–6.33)
Neutrophils: 66.7 %
Nucleated RBC: 0 /100 WBC (ref 0.0–0.0)
Platelets: 189 10*3/uL (ref 142–346)
RBC: 2.89 10*6/uL — ABNORMAL LOW (ref 3.90–5.10)
RDW: 17 % — ABNORMAL HIGH (ref 11–15)
WBC: 18.81 10*3/uL — ABNORMAL HIGH (ref 3.10–9.50)

## 2020-04-22 LAB — ECG 12-LEAD
Atrial Rate: 83 {beats}/min
P Axis: 52 degrees
P-R Interval: 142 ms
Q-T Interval: 372 ms
QRS Duration: 78 ms
QTC Calculation (Bezet): 437 ms
R Axis: 19 degrees
T Axis: 212 degrees
Ventricular Rate: 83 {beats}/min

## 2020-04-22 LAB — GLUCOSE WHOLE BLOOD - POCT
Whole Blood Glucose POCT: 125 mg/dL — ABNORMAL HIGH (ref 70–100)
Whole Blood Glucose POCT: 302 mg/dL — ABNORMAL HIGH (ref 70–100)
Whole Blood Glucose POCT: 254 mg/dL — ABNORMAL HIGH (ref 70–100)
Whole Blood Glucose POCT: 190 mg/dL — ABNORMAL HIGH (ref 70–100)

## 2020-04-22 LAB — GFR: EGFR: >60

## 2020-04-22 NOTE — Plan of Care (Signed)
Patient afebrile during shift, incontinent x2, calm and cooperative, no needs expressed.    Problem: Moderate/High Fall Risk Score >5  Goal: Patient will remain free of falls  Outcome: Progressing     Problem: Safety  Goal: Patient will be free from injury during hospitalization  Outcome: Progressing  Goal: Patient will be free from infection during hospitalization  Outcome: Progressing     Problem: Pain  Goal: Pain at adequate level as identified by patient  Outcome: Progressing     Problem: Discharge Barriers  Goal: Patient will be discharged home or other facility with appropriate resources  Outcome: Progressing     Problem: Psychosocial and Spiritual Needs  Goal: Demonstrates ability to cope with hospitalization/illness  Outcome: Progressing

## 2020-04-22 NOTE — PT Eval Note (Signed)
[facility]   Physical Therapy Evaluation     Patient: Yolanda Cox    MRN#: [id number]   Unit: [facility] ORIG BLDG 5 EAST  Bed: FO566/FO566.01      Post Acute Care Therapy Recommendations:   Discharge Recommendations: Acute Rehab Patient anticipated to benefit from and to be able to engage in 3 hours of therapy a day for 5 days a week.     Milestones to be reached to achieve recommendation: none  Anticipate achievement in n/a sessions    DME Recommended for Discharge:  tbd    If Acute Rehab recommended discharge disposition is not available, patient will need min assist for mobility, in place equipment, and continued skilled PT.     Therapy discharge recommendations may change with patient status.  Please refer to most recent note for up-to-date recommendations.    Assessment:   Significant Findings: none    Yolanda Cox is a 87 y.o. female admitted 04/21/2020.  Patient presents with compromised functional mobility secondary weakness, decrease endurance and decrease balance. Will see for PT to obtain max functional level of mobility     Therapy Diagnosis: impaired functional mobility    Rehabilitation Potential: good    Treatment Activities: eval, bed mobility, sit/stand, gt  Educated the patient to role of physical therapy, plan of care, goals of therapy and safety with mobility and ADLs.    Plan:   PT Frequency: 4-5x/wk    Treatment/Interventions: ex, balance, gt, transfers,bed mobility    Risks/benefits/POC discussed pt/daughter       Precautions and Contraindications:   falls, seizures, HOH, impaired vision,     Consult received for Yolanda Cox for PT Evaluation and Treatment.  Patients medical condition is appropriate for Physical Therapy intervention at this time.    History of Present Illness:   Yolanda Cox is a 87 y.o. female admitted on 04/21/2020 as a transfer from rehab for altered mental status/sepsis, noted to have increased WBC to 21 and  fever to 101.2.    Medical Diagnosis: Sepsis [A41.9]    Past Medical/Surgical History:  HTN, RA,s/p fall w/ traumatic brain bleed and T7 compression fx 04-08-20 ; NPH, CNS bleed, DM, HLD, hypothyroidism, osteoporosis; fibromyalgia, TIA,     Imaging/Tests/Labs:  IMPRESSION: CT Head 04-21-20   No acute intracranial abnormality. Diffuse cerebral volume  loss and chronic small vessel ischemic changes    IMPRESSION: CXR  04-21-20  Stable appearances of the chest. Atelectasis or patchy  consolidation at the left lung bases remain stable    IMPRESSION: CXR  04-21-20  Bibasilar atelectasis and/or patchy consolidation left  greater than right  Social History:per chart   Prior Level of Function: was modified independent w/ gt prior to fall 04-08-20  Assistive Devices: rollator  Baseline Activity: limited community  DME Currently at Home: Shower chair,Grab bars in shower,Hand-held shower,Grab bars around toilet; rollator; scooter forcommunity  Home Living Arrangements: alone in apartment at [employer]; supportive daughter  Subjective:   Patient is agreeable to participation in the therapy session. Nursing clears patient for therapy.     Patient Goal: get better    Pain:   Scale: not rate  Location: posterior head; R knee  Intervention: position of comfort    Objective:   Patient is in bed with telemetry; iv; external urinary cath in place.  Pt wore mask during therapy session:No      Cognitive Status and Neuro Exam:  Awake, follows commands,  orientated to self/month/yr    Musculoskeletal Examination  RUE ROM: wfl  LUE ROM: wfl  RLE ROM: wfl  LLE ROM: wfl    RUE Strength: wfl  LUE Strength: wfl  RLE Strength: wfl  LLE Strength: wfl    Functional Mobility  Rolling: min  Supine to Sit: min  Scooting: min  Sit to Stand: min  Stand to Sit: min  Transfers: min w/ rw    Ambulation  PMP - Progressive Mobility Protocol   PMP Activity: Step 6 - Walks in Room  Distance Walked (ft) (Step 6,7):  (40 ft x 1)   Level of  assistance required: min  Pattern: forward flex trunk, decrease cadence, decrease step length; decrease balance; no report dizziness  Device Used: rw  Weightbearing Status: fwb     Balance  Static Sitting: good  Dynamic Sitting: good-  Static Standing: fair w/ rw  Dynamic Standing: fair w/ rw    Participation and Activity Tolerance  Participation Effort: good  Endurance: fair  Pre gt BP 122/58 w/ hr 68 and SpO2 on RA 94%  Post gt BP 131/56 w/ hr 65 and SpO2 on RA 96%    Patient left with call bell within reach, all needs met, SCDs not in place as found, fall mat as found, bed alarm on, chair alarm n/a; daughter present and all questions answered. RN notified of session outcome and patient response.     Goals:  Goals  Time for Goal Acheivement: 5 visits  Pt Will Go Supine To Sit: with supervision  Pt Will Perform Sit To Supine: with supervision  Pt Will Perform Sit to Stand: with supervision  Pt Will Transfer Bed/Chair: with rolling walker,with supervision  Pt Will Ambulate: > 200 feet,with rolling walker,with supervision      PPE worn during session: procedural mask and gloves; eye protections    Tech present: no  PPE worn by tech: N/A     Yolanda Cox PT# [phone]    Time of Treatment  PT Received On: 04/22/20  Start Time: 1445  Stop Time: 1525  Time Calculation (min): 40 min

## 2020-04-22 NOTE — Progress Notes (Signed)
04/22/20 1339   CM Review   Acknowledgment of Outpatient/Observation Observation letter given     Spoke with patient daughter Via phone. Mailing letter to address on file.    Leone Payor   Case Management Assistant  Case Management Department  [facility]  P: [phone]   [email or web address]

## 2020-04-22 NOTE — Progress Notes (Signed)
MEDICINE PROGRESS NOTE    Date Time: 04/22/20 11:22 AM  Patient Name: Yolanda Cox  Attending Physician: Sonia Baller, DO  Date of Admission:  04/21/2020  Length of Stay:  1    Sonia Baller, D.O. Hospitalist  Spectra: [phone]  Available on secure chat, spok    Assessment & Plan:     #) SIRS  - temp 101.2, WBC 22, now 18  - fever resolved.  Received dose of zosyn in ED  - no obvious source.  Scalp prior hematoma.  No drainage.  Doubt abscess  - LFTS normal, UA normal, covid negative, CXR unremarkable  - respiratory viral panel pending    #) Hypertension  #) Hyperlipidemia  - noravsc, statin    #) DM2  - a1c 7.6  - insulin sliding scale while hospitalized  - recent diagnosis.  dtr previously reluctant to start anti-hyeprglycemics as her elevated sugar often resolves after stressful event resolves    #) Recent seizure  - continue keppra#) Hypothyroidism  - continue PTA levothyroxine    #) Rheumatoid arthritis  - resume methotrexate on discharge when recovered    Attempted to update dtr.  No answer      Clinical Milestones: pending infectious work-up.  Likely discharge tomorrow if remains stable  Anticipated discharge needs: acute rehab        Hospital Course:       Reason for hospitalization:  AMS, sepsis work-up    Hospital Course:  Ms. Yolanda Cox is a 87 y.o. female with a past medical history notable for HTN, HLD, DM2, hypothyroidism, RA on methotrexate weekly, osteoporosis, normal pressure hydrocephalus.  She was recently hospitalized at [facility] 1/8 - 1/11 after syncope and witnessed seizure.  She was started on keppra.  She was noted to have a scalp abrasion but no intracranial hemorrhage.  She was discharged to [facility].    She returned to the ED 1/21 due to being found altered.  She was without complaint.    In ED vitals notable for temperature 101.2.  Labs notable for WBC 22, lactic acid 2.1. UA and LFTs unremarkable.  Covid negative.  CXR:  Bibasilar atelectasis and/or patchy  consolidation L > R.  CT head:  No acute findings.  Diffuse cerebral volume loss and chronic small vessel ischemic changes.  Soft tissue swelling and hematoma posterior left scalp.    Subjective:     Feels well.  Feels "knot" on the back of her left lower scalp.  Not draining.  No nausea, vomiting, diarrhea.  No rash.  No abdominal pain.  No cough, congestion, shortness of breath.  No body aches.  All other systems were reviewed and are negative.      Physical Exam:     BP 116/59    Pulse 60    Temp 97.9 F (36.6 C) (Oral)    Resp 15    Ht 1.651 m (5\' 5" )    Wt 73.8 kg (162 lb 11.2 oz)    SpO2 96%    BMI 27.07 kg/m   Body mass index is 27.07 kg/m.   Weight change:    No intake or output data in the 24 hours ending 04/22/20 1122    General: elderly.  Pleasant and cooperative.  No distress  HEENT:  Normocephalic, raised hematoma left posterior scalp with ecchymosis.  No open sores or drainage. EOMI.  Moist mucous membranes  Cardiovascular: Regular rate and rhythm.  No murmurs, rubs or gallops.    Lungs: Clear  to auscultation bilaterally.  No wheezes, rales, or rhonchi.  Abdomen: Non-distended, soft. Non-tender.  No rebound or guarding.    Extremities: No clubbing, cyanosis or edema.    Skin: no rash or lesion      Meds:   Home Medications:  Medications Prior to Admission   Medication Sig Dispense Refill Last Dose    albuterol-ipratropium (DUO-NEB) 2.5-0.5(3) mg/3 mL nebulizer Take 3 mLs by nebulization every 6 (six) hours as needed (Shortness of breath or wheezing)       amLODIPine (NORVASC) 10 MG tablet Take 1 tablet (10 mg total) by mouth daily       aspirin 325 MG tablet Take 325 mg by mouth daily       atorvastatin (LIPITOR) 40 MG tablet Take 40 mg by mouth daily       butalbital-acetaminophen-caffeine (FIORICET) 50-325-40 MG per tablet Take 1 tablet by mouth every 4 (four) hours as needed for Headaches       calcium carbonate (OS-CAL) 600 MG Tab tablet Take 600 mg by mouth daily       folic acid  (FOLVITE) 1 MG tablet Take 1 mg by mouth daily       guaiFENesin (ROBITUSSIN) 100 MG/5ML Solution Take 10 mLs (200 mg total) by mouth every 4 (four) hours as needed (cough)       insulin lispro (HumaLOG) 100 UNIT/ML injection If BG >180 add the following: 181 - 200 - 2 unit 201 - 250 - 3 unit 251 - 300 - 4 unit 301 - 350 - 5 unit >350 give additional 10 unit       lactobacillus/streptococcus (RISAQUAD) Cap Take 1 capsule by mouth daily       levETIRAcetam (KEPPRA) 500 MG tablet Take 1 tablet (500 mg total) by mouth every 12 (twelve) hours       levothyroxine (SYNTHROID) 75 MCG tablet Take 75 mcg by mouth Once a day at 6:00am       methotrexate 2.5 MG tablet Take by mouth once a week       Multiple Vitamin (multivitamin) capsule Take 1 capsule by mouth daily       Omega-3 Fatty Acids (OMEGA-3 FISH OIL PO) Take 1,000 mg by mouth daily       sertraline (ZOLOFT) 50 MG tablet Take 50 mg by mouth daily       silver sulfADIAZINE (SILVADENE) 1 % cream Apply topically daily       vitamin D (cholecalciferol) 25 MCG (1000 UT) tablet Take 25 mcg by mouth daily          Inpatient Medications;    Scheduled Meds: amLODIPine, 10 mg, Oral, Daily  aspirin, 325 mg, Oral, Daily  atorvastatin, 40 mg, Oral, QHS  enoxaparin, 40 mg, Subcutaneous, Daily  folic acid, 1 mg, Oral, Daily  insulin lispro, 1-3 Units, Subcutaneous, QHS  insulin lispro, 1-5 Units, Subcutaneous, TID AC  lactobacillus/streptococcus, 1 capsule, Oral, Daily  levETIRAcetam, 500 mg, Oral, Q12H [facility]  levothyroxine, 75 mcg, Oral, Daily at 0600  sertraline, 50 mg, Oral, Daily  vitamin D, 25 mcg, Oral, Daily          PRN Meds:acetaminophen, albuterol-ipratropium, Nursing communication: Adult Hypoglycemia Treatment Algorithm **AND** dextrose **AND** dextrose **AND** glucagon (rDNA), LORazepam, melatonin, naloxone, ondansetron

## 2020-04-22 NOTE — UM Notes (Addendum)
General Criteria: Observation Care - Observation Care Admission Criteria     Criteria Review   Observation Care Admission Criteria    (X) Observation care [A] [B] is indicated for ALL of the following (1) (2) (3) (4) (5) (6)    (7) (8) (9):     (X) Clinical care (eg, testing, monitoring, or treatment) needed beyond the usual emergency department     time frame (eg, 3 to 4 hours)     (X) Clinical care needed is not appropriate for a lower level of care (ie, discharge to outpatient     setting not appropriate).     (X) Patient has clinical condition for which observation care is needed, as indicated by 1 or     more of the following :      (X) Infectious condition or finding (eg, monitor for initial response to treatment, need for minor      drainage or debridement, diagnosis or choice of treatment requires results of testing)      (22) (23) (24) (25)      (X) Neurologic condition or finding (eg, Altered mental status, confusion, weakness, dizziness,      ataxia, exacerbation of neuromuscular disease or movement disorder) (28)     04/21/2020 17:13 Inpatient changed to Observation at 23:55  Care day #1  Med/tele floor    HPI: 87y.o. Female transferred from [facility] rehab for fever, sepsis, and altered mental status  Per MD; no source of infection evident- has isolated fever and WBC but hemodynamically stable therefore would not empirically treat until determine a source    PMH: anemia, CNS bleed, recent seizure d/p, DM2 RA on methotrexate, HTN, HLD, hypothyroidism, TIA    Vs: 141/64, p 84, t 99.5, rr 20, sats +5%   Wt: 7.38kg/162lb  Body mass index is 27.07 kg/m.    Wbc - 22.30>>27.12  H/H ; 11.2/34.4>>10.9/33.6  plts 229>>233  Gluc - 215>>172    ED: vancomycin x1, zosyn iv x1, NS bolus, zofran iv x1    PLAN: observation for now possible [place] back to rehab pending infectious workup  Vs q4h  Send respiratory panel  F/u blood Cxs  If develops diarrhea, check C. Diff  AM labs  Cont.  Keppra  POCT glucose, SSI  would hold MTX until formal infection ruled out   PT/OT      04/22/2020 Observation  Care day#2    Denies pian  afebrile over night  PTOT pending    Wbc - 18.81  H/h - 8.8/28.0  plts 189    Gluc - 125    Completed by: Melvyn Novas, RN  [facility]  Utilization Review  Case Management Office:  Ph: [phone]  Fax: [phone]  Please use fax number to provide authorization for hospital services or to request additional information

## 2020-04-23 DIAGNOSIS — A419 Sepsis, unspecified organism: Secondary | ICD-10-CM

## 2020-04-23 LAB — MAN DIFF ONLY
Atypical Lymphocytes %: 1 %
Atypical Lymphocytes Absolute: 0.09 10*3/uL — ABNORMAL HIGH (ref 0.00–0.00)
Band Neutrophils Absolute: 0 10*3/uL (ref 0.00–1.00)
Band Neutrophils: 0 %
Basophils Absolute Manual: 0.19 10*3/uL — ABNORMAL HIGH (ref 0.00–0.08)
Basophils Manual: 2 %
Eosinophils Absolute Manual: 0 10*3/uL (ref 0.00–0.44)
Eosinophils Manual: 0 %
Lymphocytes Absolute Manual: 1.33 10*3/uL (ref 0.42–3.22)
Lymphocytes Manual: 14 %
Metamyelocytes Absolute: 0.19 10*3/uL — ABNORMAL HIGH
Metamyelocytes: 2 %
Monocytes Absolute: 1.99 10*3/uL — ABNORMAL HIGH (ref 0.21–0.85)
Monocytes Manual: 21 %
Myelocytes Absolute: 0.28 10*3/uL — ABNORMAL HIGH
Myelocytes: 3 %
Neutrophils Absolute Manual: 5.31 10*3/uL (ref 1.10–6.33)
Promyelocytes Absolute: 0.09 10*3/uL — ABNORMAL HIGH
Promyelocytes: 1 %
Segmented Neutrophils: 56 %

## 2020-04-23 LAB — CBC AND DIFFERENTIAL
Absolute NRBC: 0 10*3/uL (ref 0.00–0.00)
Hematocrit: 28.3 % — ABNORMAL LOW (ref 34.7–43.7)
Hgb: 9 g/dL — ABNORMAL LOW (ref 11.4–14.8)
MCH: 30.7 pg (ref 25.1–33.5)
MCHC: 31.8 g/dL (ref 31.5–35.8)
MCV: 96.6 fL — ABNORMAL HIGH (ref 78.0–96.0)
MPV: 11.2 fL (ref 8.9–12.5)
Nucleated RBC: 0 /100 WBC (ref 0.0–0.0)
Platelets: 198 10*3/uL (ref 142–346)
RBC: 2.93 10*6/uL — ABNORMAL LOW (ref 3.90–5.10)
RDW: 16 % — ABNORMAL HIGH (ref 11–15)
WBC: 9.48 10*3/uL (ref 3.10–9.50)

## 2020-04-23 LAB — BASIC METABOLIC PANEL
Anion Gap: 8 (ref 5.0–15.0)
BUN: 15 mg/dL (ref 7.0–19.0)
CO2: 22 mEq/L (ref 22–29)
Calcium: 8.8 mg/dL (ref 7.9–10.2)
Chloride: 109 mEq/L (ref 100–111)
Creatinine: 0.7 mg/dL (ref 0.6–1.0)
Glucose: 165 mg/dL — ABNORMAL HIGH (ref 70–100)
Potassium: 3.8 mEq/L (ref 3.5–5.1)
Sodium: 139 mEq/L (ref 136–145)

## 2020-04-23 LAB — CELL MORPHOLOGY
Cell Morphology: NORMAL
Platelet Estimate: NORMAL

## 2020-04-23 LAB — GFR: EGFR: >60

## 2020-04-23 LAB — GLUCOSE WHOLE BLOOD - POCT
Whole Blood Glucose POCT: 147 mg/dL — ABNORMAL HIGH (ref 70–100)
Whole Blood Glucose POCT: 270 mg/dL — ABNORMAL HIGH (ref 70–100)
Whole Blood Glucose POCT: 237 mg/dL — ABNORMAL HIGH (ref 70–100)
Whole Blood Glucose POCT: 158 mg/dL — ABNORMAL HIGH (ref 70–100)

## 2020-04-23 NOTE — Discharge Summary (Signed)
PROGRESS NOTE/DISCHARGE SUMMARY      Date Time: 04/23/20 1:04 PM  Patient Name: Yolanda Cox  Attending Physician: Sonia Baller, DO  Primary Care Physician: Shelbie Proctor, MD    Date of Admission:   04/21/2020    Date of Discharge:    04/23/20    Discharge Dx:     #)Sepsis due to viral gastroenteritis  - resolved with supportive care  - infectious work-up otherwise negative  - tolerating diet    #) Hypertension  #) Hyperlipidemia  - noravsc, statin   #) DM2  - a1c 7.6  - insulin sliding scale while hospitalized  - recent diagnosis.  dtr previously reluctant to start anti-hyeprglycemics as her elevated sugar often resolves after stressful event resolves    #) Recent seizure  - continue keppra#) Hypothyroidism  - continue PTA levothyroxine    #) Rheumatoid arthritis  - resume methotrexate on discharge when recovered    Updated dtr over the phone      Consultations:   Treatment Team: Attending Provider: Sonia Baller, DO; Resident: Baxter Hire, MD; Scribe: Dina Rich.; Registered Nurse: Myrene Buddy, RN; Case Manager: Harlon Ditty, RN; Respiratory Care Practitioner: Ula Lingo, RT         Hospital Course:     Reason for hospitalization:  AMS, sepsis work-up    Hospital Course:  Ms. Yolanda Cox is a 86 y.o. female with a past medical history notable for HTN, HLD, DM2, hypothyroidism, RA on methotrexate weekly, osteoporosis, normal pressure hydrocephalus.  She was recently hospitalized at [facility] 1/8 - 1/11 after syncope and witnessed seizure.  She was started on keppra.  She was noted to have a scalp abrasion but no intracranial hemorrhage.  She was discharged to [facility].    She returned to the ED 1/21 due to being found altered, nauseous and vomiting.      In ED vitals notable for temperature 101.2.  Labs notable for WBC 22, lactic acid 2.1. UA and LFTs unremarkable.  Covid negative.  CXR:  Bibasilar atelectasis and/or patchy consolidation L > R.  CT head:  No acute  findings.  Diffuse cerebral volume loss and chronic small vessel ischemic changes.  Soft tissue swelling and hematoma posterior left scalp.    She was admitted for further work-up.  Nausea, vomiting and fever resolved with supportive care.  PT recommended SNF.    DISCHARGE DAY EXAM:  Vitals:    04/23/20 1300   BP: 134/60   Pulse: (!) 58   Resp: 16   Temp: 98.8 F (37.1 C)   SpO2: 95%     Wt Readings from Last 3 Encounters:   04/22/20 73.8 kg (162 lb 11.2 oz)   04/16/20 67.4 kg (148 lb 9.6 oz)   04/09/20 74.6 kg (164 lb 7.4 oz)     Body mass index is 27.07 kg/m.    General: elderly.  Pleasant and cooperative.  No distress  HEENT:  Normocephalic, raised hematoma left posterior scalp with ecchymosis.  No open sores or drainage. EOMI.  Moist mucous membranes  Cardiovascular: Regular rate and rhythm.  No murmurs, rubs or gallops.    Lungs: Clear to auscultation bilaterally.  No wheezes, rales, or rhonchi.  Abdomen: Non-distended, soft. Non-tender.  No rebound or guarding.    Extremities: No clubbing, cyanosis or edema.    Skin: no rash or lesion    Discharge Medications:        Medication List  CONTINUE taking these medications    albuterol-ipratropium 2.5-0.5(3) mg/3 mL nebulizer  Commonly known as: DUO-NEB  Take 3 mLs by nebulization every 6 (six) hours as needed (Shortness of breath or wheezing)     amLODIPine 10 MG tablet  Commonly known as: NORVASC  Take 1 tablet (10 mg total) by mouth daily     aspirin 325 MG tablet     atorvastatin 40 MG tablet  Commonly known as: LIPITOR     butalbital-acetaminophen-caffeine 50-325-40 MG per tablet  Commonly known as: FIORICET  Take 1 tablet by mouth every 4 (four) hours as needed for Headaches     calcium carbonate 600 MG Tabs tablet  Commonly known as: OS-CAL     folic acid 1 MG tablet  Commonly known as: FOLVITE     guaiFENesin 100 MG/5ML Soln  Commonly known as: ROBITUSSIN  Take 10 mLs (200 mg total) by mouth every 4 (four) hours as needed (cough)     insulin lispro 100  UNIT/ML injection  Commonly known as: HumaLOG  If BG >180 add the following: 181 - 200 - 2 unit 201 - 250 - 3 unit 251 - 300 - 4 unit 301 - 350 - 5 unit >350 give additional 10 unit     lactobacillus/streptococcus Caps  Take 1 capsule by mouth daily     levETIRAcetam 500 MG tablet  Commonly known as: KEPPRA  Take 1 tablet (500 mg total) by mouth every 12 (twelve) hours     levothyroxine 75 MCG tablet  Commonly known as: SYNTHROID     methotrexate 2.5 MG tablet     multivitamin capsule     OMEGA-3 FISH OIL PO     sertraline 50 MG tablet  Commonly known as: ZOLOFT     silver sulfADIAZINE 1 % cream  Commonly known as: SILVADENE     vitamin D 25 MCG (1000 UT) tablet  Commonly known as: cholecalciferol                Discharge Instructions:      #) Viral gastroenteirtis:  Resolved    Disposition:  SNF    Patient was instructed to follow up:  Primary Care Doctor Ro, Saunders Glance, MD in 1 week     Minutes spent coordinating discharge and reviewing discharge plan: 45 minutes      Signed by: Sonia Baller, DO, MD    CC: Ro, Saunders Glance, MD

## 2020-04-23 NOTE — Plan of Care (Signed)
Pt had no needs or concerns during the shift, rested well, medications provided as ordered.      Problem: Moderate/High Fall Risk Score >5  Goal: Patient will remain free of falls  Outcome: Progressing     Problem: Safety  Goal: Patient will be free from injury during hospitalization  Outcome: Progressing  Goal: Patient will be free from infection during hospitalization  Outcome: Progressing     Problem: Pain  Goal: Pain at adequate level as identified by patient  Outcome: Progressing     Problem: Side Effects from Pain Analgesia  Goal: Patient will experience minimal side effects of analgesic therapy  Outcome: Progressing     Problem: Discharge Barriers  Goal: Patient will be discharged home or other facility with appropriate resources  Outcome: Progressing     Problem: Psychosocial and Spiritual Needs  Goal: Demonstrates ability to cope with hospitalization/illness  Outcome: Progressing

## 2020-04-23 NOTE — Rehab Progress Note (Medilinks) (Signed)
NAMEANOLA MCGOUGH  MRN: [id number]  Account: [id number]  Session Start: 04/21/2020 9:00:00 AM  Session Stop: 04/21/2020 10:00:00 AM    Total Treatment Minutes:  Minutes    Therapeutic Recreation  Inpatient Rehabilitation Progress Note    Rehab Diagnosis: Closed head injury after a fall, Mild anterior wedge  compression fracture of T7  Demographics:            Age: 44Y            Gender: Female  Rehabilitation Precautions/Restrictions:   Fall precaution, skin precaution, seizure precaution, HOH, impaired vision,  delayed mental processing    SUBJECTIVE  Patient Reports: "I haven't played it in a long time"  Pain: Patient currently without complaints of pain.    OBJECTIVE    Interventions: The following treatment was provided:  Leisure Education:  Reviewed games already downloaded on pt's ipad. Provided  instruction for Solitaire and pt demo'd learning through decreased need to cues.    Avocational Activity-Cognitive:  CTRS provided instruction, verbal cues, and  modeling throughout game of Solitaire on ipad to increase pt problem solving,  sequencing, and EF. Pt maintained attn on game for 40 mins, asked questions as  needed, and demo'd learning of concepts through decreased need for assistance  from max to mod cues.  Pain Reassessment: Pain was not reassessed as no pain was reported.    Education Provided:    Education Provided: Benefit and value of leisure activity. Leisure education. .       Audience: Patient.       Mode: Explanation.  Demonstration.   iPad based.       Response: Applied knowledge.  Verbalized understanding.  Demonstrated skill.  Needs practice.    ASSESSMENT  Pt receptive to intervention addressing leisure skills, leisure independence,  and cognition. Displayed increased ind and problem solving skills across session  and expressed positive response. Pt will benefit from continued practice  navigating ipad and with game of solitaire to cement learning.    PLAN  Therapeutic Recreation services are  recommended to address: Leisure skills and  independence, activity tolerance, cognition    Signed by: Sindy Messing, TR 04/21/2020 10:00:00 AM

## 2020-04-23 NOTE — Progress Notes (Signed)
Talked with RN Elsi for discharged to acute rehab. Given report. Pt will go room [facility].

## 2020-04-23 NOTE — Progress Notes (Signed)
Case management discharge note    Patient is medically clear to be discharged acute rehab  and patient agreed to be discharged [facility] [place] today.     Dispo: [facility]             [address] [place] [address]             Room number : 303             Report number : [phone]     Daughter to transport patient.      04/23/20 1427   Discharge Disposition   Patient preference/choice provided? Yes   Physical Discharge Disposition Acute Rehab   Mode of Transportation Car   Patient/Family/POA notified of transfer plan Yes   Patient agreeable to discharge plan/expected d/c date? Yes   Family/POA agreeable to discharge plan/expected d/c date? Yes   Bedside nurse notified of transport plan? Yes   Hard copy of narcotic RX sent with patient? N/A   Hard copy of DNR/Advance Directive sent with patient? N/A   IV antibiotics post discharge? N/A   Wound care post discharge? N/A   CM Interventions   Follow up appointment scheduled? No   Reason no follow up scheduled? Other (comment)  (AR)     Harlon Ditty RN BSN CM I  [employer] Case management  [facility]  [address]  [place] [address]   [email or web address]  [phone]

## 2020-04-23 NOTE — Discharge Instr - AVS First Page (Addendum)
Reason for your Hospital Admission and Discharge Instructions:    You were hospitalized after you were found confused with a fever and nausea and vomiting.  It was likely due to a viral infection that quickly ran its course.  You did will and did not require any change to your medications.

## 2020-04-23 NOTE — Progress Notes (Addendum)
Pt admitted to room 303 in w/c without complication. Introduced myself and oriented pt to room, whiteboard and call bell. Personal belongings documented and call bell at bedside. VSS, pt AAOx4. Pt denies pain  at this time, availability updated appropriately. All questions and concerns addressed.No clinically significant medication issues identified this shift   MD notified of Pts arrival to unit. Updated information on whiteboard and will continue to monitor pt.

## 2020-04-23 NOTE — Progress Notes (Signed)
Case management progress note.    CM spoke with [name] (daughter ) about a discharge planning and stated patient was in [facility] IR prior to this hospital visit and would like to go back to [facility].   [place] Admission liason close on Sunday. CM made  AR referral. Unit CM continue to follow up.     Harlon Ditty RN BSN CM I  [employer] [facility] Case management  [facility]  [address]  [place] [address]   [email or web address]  [phone]

## 2020-04-24 LAB — GLUCOSE WHOLE BLOOD - POCT
Whole Blood Glucose POCT: 161 mg/dL — ABNORMAL HIGH (ref 70–100)
Whole Blood Glucose POCT: 203 mg/dL — ABNORMAL HIGH (ref 70–100)
Whole Blood Glucose POCT: 221 mg/dL — ABNORMAL HIGH (ref 70–100)
Whole Blood Glucose POCT: 206 mg/dL — ABNORMAL HIGH (ref 70–100)

## 2020-04-24 NOTE — Rehab Progress Note (Medilinks) (Addendum)
Corrected 04/24/2020 2:38:45 AM    NAME: Yolanda Cox  MRN: [id number]  Account: [id number]  Session Start: 04/24/2020 12:00:00 AM  Session Stop: 04/24/2020 12:00:00 AM    Total Treatment Minutes:  Minutes    Rehabilitation Nursing  Inpatient Rehabilitation Shift Assessment    Rehab Diagnosis: Closed head injury after a fall, Mild anterior wedge  compression fracture of T7  Demographics:            Age: 54Y            Gender: Female  Primary Language: English    Date of Onset:  04/09/2020  Date of Admission: 04/11/2020 5:40:00 PM    Rehabilitation Precautions Restrictions:   Fall precaution, skin precaution, seizure precaution, HOH, impaired vision,  delayed mental processing    Patient Report: "I am okay"  Patient/Caregiver Goals:  "to get better"    Wounds/Incisions: See Epic    Medication Review: No clinically significant medication issues identified this  shift.    Bowel and Bladder Output:                       Bladder (# only)             Bowel (# only)  Number of Episodes  Continent            2                            0  Incontinent          0                            0    Additional Education Provided:    Education Provided: Safety issues and interventions. Fall protocol. Skin/wound  care. Prevention of skin breakdown. Medication. Name and dosage. Administration.  Purpose. Side Effects.       Audience: Patient.       Mode: Explanation.       Response: Verbalized understanding.    TEAM CARE PLAN  Identified problems from team documentation:  Problem: Impaired Cognition  Cognition: Primary Team Goal: Pt will answer orientation and biographical  questions with >90% accuracy given min cuing in order to improve memory./    Problem: Impaired Leisure Skills  Leisure Skills: Primary Team Goal: Pt and caregiver will describe opportunities  and resources for social engagement in the community./    Problem: Impaired Mobility  Mobility: Primary Team Goal: Pt will correctly demonstrate safe sit<>stand  transfers and  ambulation using FWW under direct SUPV or touch assist via  clinical team./Active    Problem: Impaired Self-care Mgmt/ADL/IADL  Self Care: Primary Team Goal: Pt will complete toilet transfers and all  associated tasks with supervision using AD as indicated to ensure safety./    Problem: Safety Risk and Restraint  Safety: Primary Team Goal: Pt. will recognize limitations and call for  assistance before attempting mobility 100% of the time in order to prevent fall  while in rehab/Active    Please review Integrated Patient View Care Plan Flowsheet for Team identified  Problems, Interventions, and Goals.    Signed by: Maximiano Coss, RN 04/24/2020 2:03:00 AM

## 2020-04-24 NOTE — Progress Notes (Addendum)
History of Presenting Illness:   Yolanda Cox is a 37 Yolanda Cox.o. 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  She lives at [facility] retirement community in [place].  The day of admission on 04/09/2020 she felt nauseated and she tried to walk to the dining area to seek help but passed that before she got there.  She was taken to [facility] emergency department and while there she had a tonic-clonic seizure.  A CT scan of the head showed sequelae of microvascular disease but no acute abnormalities.  CT scan of the spine showed no acute lesions.  She was started on Keppra, and was treated with Zosyn for possible aspiration pneumonia.  She was discharged to acute rehab center on 04/12/2019.  The patient was making progress with PT/OT, and was medically stable until 04/21/2020 when she had fever to 101.2, became lethargic and began to vomit. She was transferred to [facility] where WBC was 27.12, with 71%  Neutrophils, Lactic acid 2.1,UA was unremarkable. Respiratory pathogen panel was negativeCXR showed bibasilar atelectasis and patchy consolidation L>R. Blood and urine cultures were NG. Patient defervesced shortly after admission after admission, and leukocytosis returned to normal.Symptoms improved with supportive therapy and patient was transferred back to [facility] on 04/23/2020.    ______________________________________________________________________         Principal Problem:  Debility    Active Problems:  Patient Active Problem List   Diagnosis    Fall, initial encounter    Seizure after head injury    Debility    Sepsis             Interval History:  Today Temp is normal, P 55,R18, BP 118/67   .Patient looks good and feels well.     BG 161 and 158 on 04/23/20    Medications:  Norvasc, aspirin, Lipitor, Citracal plus D, lispro per low-dose sliding scale, lactobacillus,Zoloft, Keppra,synthroid, multivitamins,  folic acid, vitamin D.    Physical Exam:  Temp:  [98.1 F (36.7 C)-99.2 F (37.3 C)] 98.1 F (36.7 C)  Heart Rate:  [55-62] 55  Resp Rate:  [16-18] 18  BP: (118-132)/(62-67) 118/67  Looks very good. Daughter says she is back to baseline mentally.  She has no complaints.  Lungs clear, heart regular, abdomen soft and non tender.      Assessment:   Viral illness probably a viral gastroenteritis- recovered      Plan  Resume PT/OT  Continue medications without change.  Monitor BG-   _______________________________________________________________________    Labs    Recent Labs   Lab 04/23/20  0357 04/22/20  0350 04/21/20  1528   WBC 9.48 18.81* 27.12*   RBC 2.93* 2.89* 3.50*   Hgb 9.0* 8.8* 10.9*   Hematocrit 28.3* 28.0* 33.6*   Glucose 165* 128* 172*   BUN 15.0 12.0 17.0   Creatinine 0.7 0.7 0.8   Calcium 8.8 8.6 9.8   Sodium 139 140 138   Potassium 3.8 3.5 3.5   Chloride 109 110 104   CO2 22 22 20*                    Last 24 Hour Radiology:  Radiology results reviewed        _  Last 24 Hour Intake/Output:    Intake/Output Summary (Last 24 hours) at 04/24/2020 1355  Last data filed at 04/23/2020 2151  Gross per 24 hour   Intake 100 ml  Output 750 ml   Net -650 ml                 Scheduled Meds:  Current Facility-Administered Medications   Medication Dose Route Frequency    amLODIPine  10 mg Oral Daily    aspirin  325 mg Oral Daily    atorvastatin  40 mg Oral QHS    calcium citrate-vitamin D  2 tablet Oral Daily    folic acid  1 mg Oral Daily    insulin lispro  1-3 Units Subcutaneous QHS    insulin lispro  1-5 Units Subcutaneous TID AC    lactobacillus/streptococcus  1 capsule Oral Daily    levETIRAcetam  500 mg Oral Q12H [facility]    levothyroxine  75 mcg Oral Daily at 0600    multivitamin  1 tablet Oral Daily    sertraline  50 mg Oral Daily    vitamin D  25 mcg Oral Daily               PRN Meds:  acetaminophen, albuterol-ipratropium, butalbital-acetaminophen-caffeine, Nursing communication: Adult Hypoglycemia  Treatment Algorithm **AND** dextrose **AND** dextrose **AND** glucagon (rDNA), magnesium hydroxide, melatonin, ondansetron, senna-docusate    Continuous Infusions:              LDA Flowsheet:  Patient Lines/Drains/Airways Status     Active PICC Line / CVC Line / PIV Line / Drain / Airway / Intraosseous Line / Epidural Line / ART Line / Line / Wound / Pressure Ulcer / NG/OG Tube     None                  Safety Checklist:     DVT prophylaxis:  CHEST guideline (See page [id number])  history of nontraumatic intracranial hemorrhage.   Foley: Not present   IVs:  None   PT/OT: Ordered     Disposition:     Today's date: 04/24/2020  Anticipated medical stability for discharge:04/26/2020  Service status: Inpatinet  Reason for ongoing hospitalization: Acute rehab  Anticipated discharge needs: Home PT/OT                     _

## 2020-04-24 NOTE — Rehab Progress Note (Medilinks) (Signed)
[name]  MRN: [id number]  Account: [id number]  Session Start: 04/24/2020 11:00:00 AM  Session Stop: 04/24/2020 12:00:00 PM    Total Treatment Minutes:  Minutes    Therapeutic Recreation  Inpatient Rehabilitation Progress Note    Rehab Diagnosis: Closed head injury after a fall, Mild anterior wedge  compression fracture of T7  Demographics:            Age: 23Y            Gender: Female  Rehabilitation Precautions/Restrictions:   Fall precaution, skin precaution, seizure precaution, HOH, impaired vision,  delayed mental processing    SUBJECTIVE  Patient Reports: "I saw you last week. Are we going to have a session now?"  Pain: Patient currently without complaints of pain.    OBJECTIVE    Interventions: The following treatment was provided:  Leisure Education:  CTRS educated pt on turning on, unlocking, and navigating  ipad to use [employer]. Pt demo'd good teachback.  Avocational Activity-Cognitive:  Pt engaged in game of Solitaire to target  visual scanning, problem solving, memory, and EF skills. Displayed carry over  from prior session and min cues, asked questions as needed but was often  confirming an already correct response.  Pain Reassessment: Pain was not reassessed as no pain was reported.    Education Provided:    Education Provided: Leisure resources. Techonology education. .       Audience: Patient.       Mode: Explanation.  Demonstration.   ipad.       Response: Applied knowledge.  Verbalized understanding.  Demonstrated skill.    ASSESSMENT  Despite reports of fatigue pt progressing with in cognition and leisure skills,  going from mid-max A to min A by demo'ing carry over of education and  strategies. Would benefit from continued practice with ipad to cotninue  developing independence.    PLAN  Therapeutic Recreation services are recommended to address: Cognition, leisure  skills and independence, balance    Signed by: Sindy Messing, TR 04/24/2020 11:30:00 AM

## 2020-04-24 NOTE — Rehab Progress Note (Medilinks) (Signed)
Yolanda Cox  MRN: [id number]  Account: [id number]  Session Start: 04/24/2020 10:00:00 AM  Session Stop: 04/24/2020 11:00:00 AM    Total Treatment Minutes: 60.00 Minutes    Physical Therapy  Inpatient Rehabilitation Progress Note - Brief    Rehab Diagnosis: Closed head injury after a fall, Mild anterior wedge  compression fracture of T7  Demographics:            Age: 8Y            Gender: Female  Rehabilitation Precautions/Restrictions:   Fall precaution, skin precaution, seizure precaution, HOH, impaired vision,  delayed mental processing    SUBJECTIVE  Patient Report: Pt received supine in bed, HOB elevated, all personal items w/in  reach, w/ Dr. Harrie Jeans present. Pt reports she is "feeling much better today,  thank the Lord."  Pain: Patient currently without complaints of pain.    OBJECTIVE  Vital Signs:                       Before Activity              After Activity  Vitals  Time                 10:00                        -  Position/Activity    Supine Hob elevated          sitting  BP Systolic          149                          155  BP Diastolic         68                           63  Pulse                59                           -  O2 Saturation        95%                          -  BP post gait: 147/64    Interventions:       Therapeutic Activities:  Therapeutic Activities: PT treatment session focus  on reassessment of bed mobility, transfers, and gait training 2/2 recent ER  admission for gastric sepsis. Pt continues to require CGA for bed mobility and  transfers, and use of FWW at all times to safely ambulate. Pt ambulated >100 ft  using FWW, Min A, w/ intermittent rest breaks and reassessment of vitals for  safety. Fitted pt for a new 18x18 WC cushion, due to return of previous cushion  upon pt's admission to ER.    10 Meter Walk Test  Gait speed in meters/second: 0.21 meters per second  Assistive devices or braces used: FWW  Assist required to complete test: CGA  Additional  comments:   Gait Speed avg of 4 trials  Patient was left seated in chair in his/her room. Handoff to nurse completed.  Chair alarm in place and activated.  Oriented to call bell and placed within reach.  Personal items within reach.  Assistive devices positioned out of reach.  Pain Reassessment:  Response to Pain Intervention: During gait training, pt reports pain in RLE  popliteal fossa. Pt describes pain as continuous, deep, and sharp. Upon  palpation of deep venous distribution, pt shouts out in pain. No redness,  discoloration, or pitting edema noted. Negative Homan's Sign. Pt reports  mobility eases pain.  Post Intervention Pain Quality:  None.  Patient Reports Post Intervention Pain Level of: Patient is unable to articulate  level of pain. Patient demonstrates: Frowning. Repositioned patient.    Education Provided:    Education Provided: Safety issues and interventions. Repeated re-orientation.  Supervision requirements. Use of adaptive devices. Plan of care. Pain  management. Pain scale. Bed mobility. Functional transfers. Fall  prevention/balance training. Car transfers. Equipment. Gait.  Stair/curb/environmental barrier negotiation.       Audience: Patient.       Mode: Explanation.  Demonstration.       Response: Applied knowledge.  Verbalized understanding.  Demonstrated skill.  Needs practice.  Needs reinforcement.    ASSESSMENT  Upon functional mobility assessment since pt's admission to the ER for gastric  sepsis on 04/21/2020, bed mobility, transfers, and gait patterns grossly CGA  versus SUPV levels prior to ER admission. See Care Tools for details. Pt  presents with increased mental processing delay and L visual inattention  compared to previous Tx session prior to ER transfer.  PT recommending gait training, dual cog task training, and static and dynamic  standing balance training to reassess functional mobility in a distracting  environment to increase environmental and safety awareness to prepare  for D/C.    CARE Tool  Mobility Functional Assessment  Roll Left and Right: 04 - Supervision: Helper provides supervision for safety or  verbal cues ONLY  Assistive Device(s):   None   Bed flat, using bed rail    Lying to Sitting on Side of Bed: 04 - Supervision: Helper provides supervision  for safety or verbal cues ONLY  Assistive Device(s):   None   Bed flat, using rail    Sit to Stand: 04 - Touching Assistance: Helper provides  touching/steadying/contact guard  Assistive Device(s):   Rolling walker   Using FWW, and VQs for scooting forward on EOB to increase proximal anterior  tibial translation for efficient body mechanics.    Chair/Bed-to-Chair Transfer: 04 - Touching Assistance: Helper provides  touching/steadying/contact guard  Assistive Device(s):   Rolling walker   Using FWW and VQ's for proper hand placement/sequencing to square BL LE and AD  against WC prior to initiating hip flexion and trunk flexion for sitting.    Car Transfer: 04 - Touching Assistance: Helper provides  touching/steadying/contact guard   04 - Touching Assistance: Helper provides touching/steadying/contact guard   Using helper for touch assist from [facility] to car door, pt requires VCing for safe  hand and foot placement to safely navigate in and out of car.    Walk 50 Feet with Two Turns: 04 - Touching Assistance: Helper provides  touching/steadying/contact guard   Pt tolerated /R/75 ft ambulation using FWW w/ touch assist and Vying for  maintaining COM w/in FWW frame. Pt ambulates w/ appropriate step-length and toe  off for step-through, reciprocal gait pattern.    Walking 10 Feet on Uneven Surface: 04 - Touching Assistance: Helper provides  touching/steadying/contact guard   Using FWW and touch assist, pt ambulates w/ decreased step length and increased  time to safely navigate uneven terrain and I" thresholds.  1 Step (curb):  03 - Partial/Moderate Assistance: Helper does 1-25% of the  activity   Helper provides CGA- Min A for  increased steadying, VC's and tactile cues for  proper sequencing and placement of FWW. Pt benefits from multimodal to square  BLE w/in AD frame prior to initiating lift/lowering of FWW for curb ascension  and descension.      PLAN  Continued Physical Therapy is recommended.  Recommended Frequency/Duration/Intensity: 60-120 minutes, 5-7 days a week for  10-14 days from IE on 04/12/2020.  D/C set for 04/25/2020. PT recommending additional 2-3 days of IP Rehab prior to  D/C 2/2 to recent admission to ER for gastric sepsis.  Activities Contributing Toward Care Plan: TherActivities, TherEx, Transfer  training, gait training, static and dynamic balance training in sitting and  standing, cardiovascular endurance training, B LE strength training. Pt will  benefit from skilled care to improve safety, decrease fall risk, and reduce  caregiver burden, [place] planning as part of the interdisciplinary team.    3 Hour Rule Minutes: 60 minutes of PT treatment this session count towards  intensity and duration of therapy requirement. Patient was seen for the full  scheduled time of PT treatment this session.  Therapy Mode Minutes: Individual: 60 minutes.    Assessment and treatment was provided under the 1:1 direct supervision of Juluis Pitch, PT, DPT, NCS.    Signed by: Everlene Other, SPT 04/24/2020 11:00:00 AM    Direct 1:1 supervision of Everlene Other, student of Physical Therapy, was  provided throughout this session. - CoSigned By: Juluis Pitch, PT 04/24/2020  9:33:02 PM

## 2020-04-24 NOTE — Rehab Progress Note (Medilinks) (Signed)
Yolanda Cox  MRN: [id number]  Account: [id number]  Session Start: 04/24/2020 8:00:00 AM  Session Stop: 04/24/2020 9:00:00 AM    Total Treatment Minutes: 60.00 Minutes    Occupational Therapy  Inpatient Rehabilitation Progress Note - Brief    Rehab Diagnosis: Closed head injury after a fall, Mild anterior wedge  compression fracture of T7  Demographics:            Age: 75Y            Gender: Female  Rehabilitation Precautions/Restrictions:   Fall precaution, skin precaution, seizure precaution, HOH, impaired vision,  delayed mental processing    SUBJECTIVE  Patient Report: "I am feeling fine, is that what you asked me?"  Pain: Patient currently without complaints of pain.    OBJECTIVE  Vital Signs:                       Before Activity              After Activity  Vitals  Time                 0805                         0850  Position/Activity    supine/RUE                   seated/RUE  BP Systolic          128                          137  BP Diastolic         68                           75  Pulse                65                           65    Interventions:       Self Care/Home Management:  Pt received supine in bed willing to engage in  therapy session, RN cleared pt for therapy session (as per RN pt returned back  from hospital last night due to recent decline in function and is now cleared to  continue participation in skilled inpatient therapy services).    ADL Training: Pt engaged in full ADL routine including bathing, toileting, full  body dressing and basic grooming tasks (please refer to caretool section for  details regarding pt's level of assistance with these tasks). Pt noted to  demonstrate decline in functional status requiring increased assistance with  these tasks then previous sessions. Education/demonstration provided on  compensatory strategies, transfer training and safety througout tasks to  maximzie IND and decrease risk for falls. Increased time needed throughout  session for  processing and rest breaks 2/2 fatigue.  Patient was returned to or left in bed. Handoff to nurse completed.  Bed alarm in place and activated.  Oriented to call bell and placed within reach.  Personal items within reach.  Assistive devices positioned out of reach.  Bed placed in lowest position.  Pain Reassessment: Pain was not reassessed as no pain was reported.    Education Provided:    Education Provided: Plan of care. Activities of daily  living. Functional  transfers. Safety issues and interventions. Fall protocol. Rehab techniques and  procedures. compensatory strategies       Audience: Patient.       Mode: Explanation.  Demonstration.       Response: Applied knowledge.  Verbalized understanding.  Needs practice.  Needs reinforcement.    ASSESSMENT  Yolanda Cox tolerated therapy session well today, however required increased  assistance with BADLs then prior session 2/2 decline in function and decreased  strength since [place] to acute care. She required grossly Min A for BADLs (prior to  acute care [place] pt was progressing to SBA). Pt demonstrates decreased endurance,  decreased overall strength and decreased balance/mobility negatively impacting  her overall IND. Yolanda Cox would benefit from additional time to address these  deficits and progress back to her PLOF prior to [place].    CARE Tool  Self-Care Functional Assessment  Eating: 05 - Setup or Clean Up Assistance: Helper sets up or cleans up prior to  or following an activity  Assistive Device(s):  None    Oral Hygiene: 03 - Partial/Moderate Assistance: Helper does 1-25% of the  activity  Assistive Device(s):   None  Context for oral hygiene:   Standing   Min A provided for steadying/set up standing at sink with RW.    Toileting Hygiene: 03 - Partial/Moderate Assistance: Helper does 1-25% of the  activity  Toileting Equipment:   Nurse, mental health):   Adaptive device to maintain balance   Min A provided for steadying during standing portion of task,  assistance  provided with adjustment of LE clothing    Shower/Bathe Self: 03 - Partial/Moderate Assistance: Helper does 1-25% of the  activity  Location:  Shower.  Assistive Device(s):   Grab bar/arm rest to maintain balance, Hand held shower    Upper Body Dressing:   04 - Supervision: Helper provides supervision for safety or verbal cues ONLY  Assistive Device(s):   None   Pt donned pullover shirt seated at EOB with close supervision    Lower Body Dressing: 03 - Partial/Moderate Assistance: Helper does 1-25% of the  activity  Assistive Device(s):   None   Min A provided for steadying during standing and threading BLEs into pants  while seated at EOB    Putting On/Taking Off Footwear: 03 - Partial/Moderate Assistance: Helper does  1-25% of the activity  Assistive Device(s):   None    Mobility Functional Assessment    Toilet Transfer: 03 - Partial/Moderate Assistance: Helper does 1-25% of the  activity  Assistive Device(s):   Rolling walker   Min A provided for steadying during transfer    PLAN  Continued Occupational Therapy is recommended.  Recommended Frequency/Duration/Intensity: 60-120 min/day, 5-6 days/week, for  approx 10-14 days from evaluation on 04/13/2019; 1:1 therapy or group treatment  as appropriate.  Continued Activities Contributing Toward Care Plan: ADL/IADL retraining, ther  ex, ther act, modalities, DME recommendations, d/c planning, equipment, family  education/training, functional mobility/transfer training, energy conservation,  balance training, fall prevention training, functional activity tolerance  training, NMRE, cognitive retraining, ARMEO, RTI, Bioness    3 Hour Rule Minutes: 60 minutes of OT treatment this session count towards  intensity and duration of therapy requirement. Patient was seen for the full  scheduled time of OT treatment this session.  Therapy Mode Minutes: Individual: 60 minutes.    Signed by: Wells Guiles, OT 04/24/2020 9:00:00 AM

## 2020-04-24 NOTE — Progress Notes (Signed)
Discharge Planning Note:       Patient: Yolanda Cox (87 y.o. female)  Admission Date: 04/11/2020 [facility] Day 13)    Active Hospital Problems    Diagnosis    Debility       Length of stay: Hospital Day 13    CMA spoke with the patients daughter in regard rescheduling Family Training. The patients daughter agreed to family training on this Wednesday 04/26/20 starting at 2:00PM.     The will transition back to IFL with hired caregiver support per her daughter.     She will complete training sessions with the patients therapist to help her understand the patients functional care needs.     CM will continue to follow for discharge plans and coordination.       Ms. Dallie Piles, MSW   Rehabilitation Case Manager  [facility]   [address]   [place], [place] [address]  [place] [phone]

## 2020-04-24 NOTE — Progress Notes (Signed)
PHYSICAL MEDICINE AND REHABILITATION  PROGRESS NOTE -- FACE-TO-FACE ENCOUNTER    Date Time: 04/24/20 11:25 AM  Patient Name: Yolanda Cox, Yolanda Cox    Admission date:  04/11/2020    Subjective:     Patient returned from the ED yesterday. She was diagnosed with viral gastroenteritis and treated conservatively. Says she is feeling much better today and looking forward to getting back to therapy.     Functional Status:     PT:  Pt ambulates w/ decreased gait speed during dual cog tasks 2/2 to problem  solving deficits. During all gait training activities, pt benefits from verbal  and visual cues to anteriorly shift COM to front of FWW, and to increase step  length and step height for increased foot clearance during swing-phase. Pt  demonstrates appropriate BL weight shift and trunk extension while reaching for  objects outside of COM, however, requires verbal cuing to place FWW as close to  target as possible before initiating reach. During side-stepping intervention,  pt presents w/ decreased lateral step length and height 2/2 decreased strength  of BL hip abductors and decreased dynamic balance. Recommending dynamic standing  balance training in multiple planes to increase SLS time for improved gait  mechanics and to increase safety and functional mobility.    OT:  Scarlette Calico tolerated therapy session well today, however required increased  assistance with BADLs then prior session 2/2 decline in function and decreased  strength since [place] to acute care. She required grossly Min A for BADLs (prior to  acute care [place] pt was progressing to SBA). Pt demonstrates decreased endurance,  decreased overall strength and decreased balance/mobility negatively impacting  her overall IND. Marlia would benefit from additional time to address these  deficits and progress back to her PLOF prior to .      SLP:  Breakdowns in reading/interpretation of visual stimulus appear to be related to  visual deficits vs. L-attention. Although do  suspect a mild deficit in this  area, but does not appear to impact her ability to locate items in her  environment. In order to read effectively, pt requires large/dark font. This  will impact her ability to read medicine labels provided by a pharmacy,  therefore will need assistance with this upon d/c home. SR proved to be an  effective memory treatment and would benefit from additional use in future  sessions.    Medications:   Medication reviewed by me:     Scheduled Meds: PRN Meds:   amLODIPine, 10 mg, Oral, Daily  aspirin, 325 mg, Oral, Daily  atorvastatin, 40 mg, Oral, QHS  calcium citrate-vitamin D, 2 tablet, Oral, Daily  folic acid, 1 mg, Oral, Daily  insulin lispro, 1-3 Units, Subcutaneous, QHS  insulin lispro, 1-5 Units, Subcutaneous, TID AC  lactobacillus/streptococcus, 1 capsule, Oral, Daily  levETIRAcetam, 500 mg, Oral, Q12H [facility]  levothyroxine, 75 mcg, Oral, Daily at 0600  multivitamin, 1 tablet, Oral, Daily  sertraline, 50 mg, Oral, Daily  vitamin D, 25 mcg, Oral, Daily        Continuous Infusions:   acetaminophen, 650 mg, Q6H PRN  albuterol-ipratropium, 3 mL, Q6H PRN  butalbital-acetaminophen-caffeine, 1 tablet, Q4H PRN  dextrose, 15 g of glucose, PRN   And  dextrose, 12.5 g, PRN   And  glucagon (rDNA), 1 mg, PRN  magnesium hydroxide, 30 mL, Daily PRN  melatonin, 3 mg, QHS PRN  ondansetron, 4 mg, Q8H PRN  senna-docusate, 2 tablet, QHS PRN  Medication Review  1. A complete drug regimen review was completed: Yes  2. Were any drug issues found during review?: No      Review of Systems:   A comprehensive review of systems was: No fevers, chills, nausea, vomiting, chest pain, shortness of breath, cough, headache, double vision.  All others negative.    Physical Exam:     Vitals:    04/21/20 0545 04/21/20 0824 04/24/20 0546 04/24/20 0957   BP: 129/68 122/62 118/67 118/67   Pulse: 68  (!) 55    Resp: 16  18    Temp: 98.6 F (37 C)  98.1 F (36.7 C)    TempSrc: Oral  Oral    SpO2: 96%  95%     Weight:       Height:           Intake and Output Summary (Last 24 hours) at Date Time    Intake/Output Summary (Last 24 hours) at 04/24/2020 1125  Last data filed at 04/23/2020 2151  Gross per 24 hour   Intake 100 ml   Output 750 ml   Net -650 ml     P.O.: 100 mL (04/23/20 2151)     Urine: 0 mL (04/23/20 2151)           General: WN/WD, in NAD, seen lying in bed  HEENT: NC/AT, moist mucosal membranes, anicteric sclera  Cardiac: regular rate, extremities warm and well perfused  Resp: non-labored breathing, acyanotic  Abdomen: soft, non-tender, non-distended  Neuro: Awake and alert, oriented to person and place, knows the month and year but not day, speech fluent and spontaneous without dysarthria but is slowed, mild proximal weakness but moves extremities with AG strength throughout    Labs:   No results for input(s): GLUCOSEWHOLE in the last 24 hours.    Recent Labs   Lab 04/23/20  0357 04/22/20  0350 04/21/20  1528 04/21/20  1445   WBC 9.48 18.81* 27.12* 22.30*   Hgb 9.0* 8.8* 10.9* 11.2*   Hematocrit 28.3* 28.0* 33.6* 34.4*   Platelets 198 189 233 229        Recent Labs   Lab 04/23/20  0357 04/22/20  0350 04/21/20  1528 04/21/20  1445   Sodium 139 140 138 139   Potassium 3.8 3.5 3.5 3.6   Chloride 109 110 104 105   CO2 22 22 20* 24   BUN 15.0 12.0 17.0 17.0   Creatinine 0.7 0.7 0.8 0.8   Calcium 8.8 8.6 9.8 10.1   Albumin  --   --  4.2  --    Protein, Total  --   --  7.7  --    Bilirubin, Total  --   --  0.9  --    Alkaline Phosphatase  --   --  72  --    ALT  --   --  15  --    AST (SGOT)  --   --  23  --    Glucose 165* 128* 172* 215*             Results     Procedure Component Value Units Date/Time    Glucose Whole Blood - POCT [[id number]]  (Abnormal) Collected: 04/24/20 0729     Updated: 04/24/20 0741     Whole Blood Glucose POCT 161 mg/dL     Glucose Whole Blood - POCT [[id number]]  (Abnormal) Collected: 04/23/20 2107     Updated: 04/23/20 2113  Whole Blood Glucose POCT 158 mg/dL     Glucose Whole Blood -  POCT [[id number]]  (Abnormal) Collected: 04/23/20 1736     Updated: 04/23/20 1739     Whole Blood Glucose POCT 237 mg/dL                Rads:   Radiological Procedure reviewed.  Radiology Results (24 Hour)     ** No results found for the last 24 hours. **              Assessment and Plan:     87yo F with prior traumatic brain bleed, NPH, HTN, DM, HLD, hypothyroidism, RA, and osteoporosis presented after syncopal event and was found to have aspiration pneumonia thought to be secondary to seizures    #Rehab  - continue PT/OT/SLP  - 1/13 patient was discussed this morning in team conference, see medilinks notes for details  - 1/20 she was discussed this morning in team conference, see medilinks notes for details  - 1/24 patient returned from the ED yesterday after being diagnosed with viral gastroenteritis; appears she has had a bit of a functional decline and will likely try to extend patient's stay an additional 2-3 days to address this    #New onset seizures  - patient with syncopal episode in assisted living facility and then seizure activity while in ED  - spot EEG negative  - CT head and MRI brain negative for acute intracranial process  - seen by neurology, Dr. Marney Doctor while in acute hospital  - seizure precautions  - continue with keppra 500mg  BID    #Hx of traumatic brain bleed/NPH  - CT and MRI showing chronic changes only; without evidence of hydrocephalus  - does have some cognitive impairments that may be secondary to this prior injury  - SLP will be working with patient on cognition    #Viral Gastroenteritis  - patient was sent to the ED on 1/21 and returned 1/23; underwent extensive sepsis workup and was diagnosed with viral gastroenteritis. Conservative management    #Aspiration pneumonia  - thought to be secondary to seizure activity  - was on zosyn while in acute hospital and transitioned to augmentin to complete 5 day course on admission to rehab, stop date 1/16  - probiotic, incentive spirometer,  duonebs PRN  - will need repeat CXR in 4-6 weeks for resolution  - 1/17 patient has completed abx, no current signs/symptoms of infection    #HTN  - amlodipine 10mg   - 1/24 BP at goal    #DM  - HbA1c 7.6  - per daughter the patient's blood sugar elevates when she is stressed and then it returns to normal, decision was made to hold off on starting an oral agent  - continue SSI  - 1/24 blood sugar remains elevated but daughter prefers to not have patient on standing medications    #HLD  - atorvastatin 40mg     #Hypothyroidism  - levothyroxine    #Anemia  - likely secondary to chronic disease  - Hb 10.5 on admission, continue to monitor with intermittent cbc  - 1/20 Hb 9.6  - 1/23 Hb 9.0    #T7 compression fracture  - age indeterminate with no acute findings on exam, continue to monitor    #Depression  - zoloft 50mg     #Rheumatoid arthritis  - on methotrexate qweekly, can restart after d/c from rehab    #Osteoporosis  - citracal-vitamin D 2 tab daily    #DVT ppx  -  ASA 325    #Dispo  EDD 1/25 - likely extend to the end of the week given decline     Patient Active Problem List   Diagnosis    Fall, initial encounter    Seizure after head injury    Debility    Sepsis       Continue comprehensive and intensive inpatient rehab program, including:   Physical therapy 60-120 min daily, 5-6 times per week, Occupational therapy 60-120 min daily, 5-6 times per week, speech therapy 60-120 min daily, 5-6 times per week, Case management and Rehabilitation nursing    Signed by: Charlestine Massed, MD MD    [facility]    If there are questions or concerns about the content of this note or information contained within the body of this dictation they should be addressed directly with the author for clarification.

## 2020-04-24 NOTE — Rehab PPS CMG (Medilinks) (Signed)
Yolanda Cox  MRN: [id number]  Account: [id number]  Session Start: 04/23/2020 12:00:00 AM  Session Stop: 04/23/2020 12:00:00 AM    Total Treatment Minutes:  Minutes    Interrupted Stay 1    Departure Date: 04/21/2020  Return Date: 04/23/2020    Signed by: Fabian November, LPTA, PPS Coordinator 04/23/2020 5:00:00 PM

## 2020-04-25 LAB — GLUCOSE WHOLE BLOOD - POCT
Whole Blood Glucose POCT: 148 mg/dL — ABNORMAL HIGH (ref 70–100)
Whole Blood Glucose POCT: 205 mg/dL — ABNORMAL HIGH (ref 70–100)
Whole Blood Glucose POCT: 239 mg/dL — ABNORMAL HIGH (ref 70–100)
Whole Blood Glucose POCT: 211 mg/dL — ABNORMAL HIGH (ref 70–100)

## 2020-04-25 NOTE — Rehab Progress Note (Medilinks) (Signed)
Yolanda Cox  MRN: [id number]  Account: [id number]  Session Start: 04/20/2020 12:00:00 AM  Session Stop: 04/20/2020 12:00:00 AM    Total Treatment Minutes:  Minutes    Case Management  Inpatient Rehabilitation Team Conference    Conference Date/Time: 04/20/2020 11:09:00 AM    Demographics            Age: 74Y            Gender: Female    Admission Date: 04/11/2020 5:40:00 PM  Diagnosis: Closed head injury after a fall, Mild anterior wedge compression  fracture of T7  Comorbidities:    VITAL SIGNS  Blood Pressure: 124/69 mmHg  Temperature:  degrees  Pulse:  beats per minute  Respirations:  breaths per minute  Pain: 4/10    WEIGHT and NUTRITION  Admission Weight:  pounds; Current Weight: pounds  Weight Change since Admit:  Food Consistency:  Liquid Consistency:    Plan Of Care  Anticipated Discharge Date/Estimated Length of Stay: 04/25/2020  Anticipated Discharge Destination: Community discharge with assistance  Discharge Plan : Home  Fall Risk Level: Red (High)  Medical Necessity Expected Level Rationale: medical management, vital signs, lab  monitoring  Intensity and Duration: an average of 3 hours/5 days per week  Medical Supervision and 24 Hour Rehab Nursing: x  Physical Therapy: x  PT Intensity/Duration: 60-120 min daily, 5-6 times per week  PT Number of days: 10  Occupational Therapy: x  OT Intensity/Duration: 60-120 min daily, 5-6 times per week  OT Number of days: 10  Speech and Language Therapy: x  SLP Intensity/Duration: 60-120 min daily, 5-6 times per week  SLP Number of days: 10  Therapeutic Recreation: x  Psychology: x  Registered Dietician: x  Case Management: Oretha Milch, CM  Nurse: Judie Petit, RN  Occupational Therapy: Margretta Sidle, OT  Physical Therapy: Juluis Pitch, PT  Physician: Gillis Santa, MD  Speech Language Pathology: Mikael Spray, SLP  Occupational Therapy Other: Margretta Sidle, OT reporting for [facility], OT  after thorough chart review    The following is a list of  patient problems that have been identified by the  interdisciplinary team:    Problem: Impaired Cognition  Cognition Status Update: Pt can answer orientation/biographical questions with  >90% accuracy given min cuing for navigating memory log/external aids.  Team Identified Barrier to Discharge: No  Interventions:  Decrease environmental stimuli: Active  Safety awareness training: Active  Compensatory strategies: Active  Cognition: Primary Team Goal/Status: Pt will answer orientation and biographical  questions with >90% accuracy given min cuing in order to improve memory. /    Problem: Impaired Leisure Skills  Leisure Skills: Primary Team Goal/Status: Pt and caregiver will describe  opportunities and resources for social engagement in the community. /    Problem: Impaired Mobility  Mobility Status Update: grossly SBA for supine<>sit transfers, CGA for  sit<>stand and stand-step-turn transfers with FWW, CGA for gait up to 200-300  feet with FWW  Interventions:  Transfer training: Active  Gait training: Active  Wheelchair propulsion and management: Active  Mobility: Primary Team Goal/Status: Pt will correctly demonstrate safe  sit<>stand transfers and ambulation using FWW under direct SUPV or touch assist  via clinical team. / Active    Problem: Impaired Self-care Mgmt/ADL/IADL  Team Identified Barrier to Discharge: Yes  Interventions:  Adaptive equipment training: Active  Compensatory strategies: Active  Encourage patient to participate in activities of daily living: Active  Self Care: Primary Team Goal/Status: Pt will complete toilet  transfers and all  associated tasks with supervision using AD as indicated to ensure safety. /    Problem: Safety Risk and Restraint  Safety Risk Status Update: Patient rehab fall color is red, on avasys; patient  is not using call bell for help.  Team Identified Barrier to Discharge: Yes  Interventions:  Identify patient at risk for falls: Active  Call light within reach:  Active  Orient patient/family/caregiver to room and equipment: Active  Stay with patient in bathroom: Active  Bed alarm/wheelchair alarm: Active  Anticipate needs, including personal items within reach: Active  Other Safety Intervention 1 - Text: Patient reeducated about safety issues with  teach back.  Other Safety Intervention 1 - Status: Active  Safety: Primary Team Goal/Status: Pt. will recognize limitations and call for  assistance before attempting mobility 100% of the time in order to prevent fall  while in rehab / Active    Self Care Functional Status  Eating:  4 - Supervision or touching assistance  Oral Hygiene:  4 - Supervision or touching assistance  Toileting:  4 - Supervision or touching assistance  Shower Bathe:  4 - Supervision or touching assistance  Upper Body Dressing:  5 - Setup or clean-up assistance  Lower Body Dressing:  4 - Supervision or touching assistance  Putting On/Taking Off Footwear:  4 - Supervision or touching assistance    Mobility Functional Status  Roll Left and Right:  4 - Supervision or touching assistance  Sit to Lying:  4 - Supervision or touching assistance  Lying to Sitting on Side of Bed:  4 - Supervision or touching assistance  Sit to Stand:  4 - Supervision or touching assistance  Chair/Bed-to-Chair Transfer:  4 - Supervision or touching assistance  Toilet Transfer:  4 - Supervision or touching assistance  Car Transfer:  4 - Supervision or touching assistance  Walk 10 Feet:  4 - Supervision or touching assistance  Walk 50 Feet with Two Turns:  4 - Supervision or touching assistance  Walking 150 Feet:  4 - Supervision or touching assistance  Walking 10 Feet on Uneven Surface:  3 - Partial/moderate assistance  1 Step - Curb:  3 - Partial/moderate assistance  4 Steps:  9 - Not applicable  12 Steps:  9 - Not applicable  Picking Up an Object:  4 - Supervision or touching assistance      Comments: None    Signed by: Dallie Piles, MSW 04/20/2020 3:27:00 PM    Physician  CoSigned By: Gillis Santa 04/25/2020 13:16:11

## 2020-04-25 NOTE — Progress Notes (Signed)
PHYSICAL MEDICINE AND REHABILITATION  PROGRESS NOTE -- FACE-TO-FACE ENCOUNTER    Date Time: 04/25/20 10:22 AM  Patient Name: Yolanda Cox, Yolanda Cox    Admission date:  04/11/2020    Subjective:     No acute events overnight. Patient feels well today without current complaints. Says she slept well. Denies pain. Had some questions about what happened to her when she went to the ED.      Functional Status:     PT:  Upon functional mobility assessment since pt's admission to the ER for gastric  sepsis on 04/21/2020, bed mobility, transfers, and gait patterns grossly CGA  versus SUPV levels prior to ER admission. See Care Tools for details. Pt  presents with increased mental processing delay and L visual inattention  compared to previous Tx session prior to ER transfer.  PT recommending gait training, dual cog task training, and static and dynamic  standing balance training to reassess functional mobility in a distracting  environment to increase environmental and safety awareness to prepare for D/C.    OT:  Scarlette Calico tolerated therapy session well today, however required increased  assistance with BADLs then prior session 2/2 decline in function and decreased  strength since [place] to acute care. She required grossly Min A for BADLs (prior to  acute care [place] pt was progressing to SBA). Pt demonstrates decreased endurance,  decreased overall strength and decreased balance/mobility negatively impacting  her overall IND. Alaine would benefit from additional time to address these  deficits and progress back to her PLOF prior to [place].      SLP:  Slight decline in cognition since ED admission, scoring 2-pts less on O-log  compared to previous administration. Pt with little awareness of change in  medical status despite education from team members throughout the day, showing  little carry over of new information. Pt with planned d/c for tomorrow; however  pt would benefit from extended stay x2-3 days to improve cognition and safety  to  a more functional level.    Medications:   Medication reviewed by me:     Scheduled Meds: PRN Meds:   amLODIPine, 10 mg, Oral, Daily  aspirin, 325 mg, Oral, Daily  atorvastatin, 40 mg, Oral, QHS  calcium citrate-vitamin D, 2 tablet, Oral, Daily  folic acid, 1 mg, Oral, Daily  insulin lispro, 1-3 Units, Subcutaneous, QHS  insulin lispro, 1-5 Units, Subcutaneous, TID AC  lactobacillus/streptococcus, 1 capsule, Oral, Daily  levETIRAcetam, 500 mg, Oral, Q12H [facility]  levothyroxine, 75 mcg, Oral, Daily at 0600  multivitamin, 1 tablet, Oral, Daily  sertraline, 50 mg, Oral, Daily  vitamin D, 25 mcg, Oral, Daily        Continuous Infusions:   acetaminophen, 650 mg, Q6H PRN  albuterol-ipratropium, 3 mL, Q6H PRN  butalbital-acetaminophen-caffeine, 1 tablet, Q4H PRN  dextrose, 15 g of glucose, PRN   And  dextrose, 12.5 g, PRN   And  glucagon (rDNA), 1 mg, PRN  magnesium hydroxide, 30 mL, Daily PRN  melatonin, 3 mg, QHS PRN  ondansetron, 4 mg, Q8H PRN  senna-docusate, 2 tablet, QHS PRN          Medication Review  1. A complete drug regimen review was completed: Yes  2. Were any drug issues found during review?: No      Review of Systems:   A comprehensive review of systems was: No fevers, chills, nausea, vomiting, chest pain, shortness of breath, cough, headache, double vision.  All others negative.    Physical Exam:  Vitals:    04/24/20 0957 04/24/20 1632 04/25/20 0421 04/25/20 0902   BP: 118/67 119/64 147/69 137/68   Pulse:  (!) 57 61 66   Resp:  16 18 16    Temp:  97.7 F (36.5 C) 98.1 F (36.7 C) 98.2 F (36.8 C)   TempSrc:  Oral Oral Oral   SpO2:  98% 98% 100%   Weight:       Height:           Intake and Output Summary (Last 24 hours) at Date Time    Intake/Output Summary (Last 24 hours) at 04/25/2020 1022  Last data filed at 04/24/2020 2000  Gross per 24 hour   Intake 560 ml   Output 0 ml   Net 560 ml     P.O.: 60 mL (04/24/20 2000)     Urine: 0 mL (04/24/20 2000)           General: WN/WD, in NAD, seen sitting in chair  at bedside  HEENT: NC/AT, moist mucosal membranes, anicteric sclera  Cardiac: regular rate, extremities warm and well perfused  Resp: non-labored breathing, acyanotic  Abdomen: soft, non-tender, non-distended  Neuro: Awake and alert, oriented to person and place, knows the month and year but not day, speech fluent and spontaneous without dysarthria but is slowed, mild proximal weakness but moves extremities with AG strength throughout    Labs:   No results for input(s): GLUCOSEWHOLE in the last 24 hours.    Recent Labs   Lab 04/23/20  0357 04/22/20  0350 04/21/20  1528 04/21/20  1445   WBC 9.48 18.81* 27.12* 22.30*   Hgb 9.0* 8.8* 10.9* 11.2*   Hematocrit 28.3* 28.0* 33.6* 34.4*   Platelets 198 189 233 229        Recent Labs   Lab 04/23/20  0357 04/22/20  0350 04/21/20  1528 04/21/20  1445   Sodium 139 140 138 139   Potassium 3.8 3.5 3.5 3.6   Chloride 109 110 104 105   CO2 22 22 20* 24   BUN 15.0 12.0 17.0 17.0   Creatinine 0.7 0.7 0.8 0.8   Calcium 8.8 8.6 9.8 10.1   Albumin  --   --  4.2  --    Protein, Total  --   --  7.7  --    Bilirubin, Total  --   --  0.9  --    Alkaline Phosphatase  --   --  72  --    ALT  --   --  15  --    AST (SGOT)  --   --  23  --    Glucose 165* 128* 172* 215*             Results     Procedure Component Value Units Date/Time    Glucose Whole Blood - POCT [[id number]]  (Abnormal) Collected: 04/25/20 0727     Updated: 04/25/20 0733     Whole Blood Glucose POCT 148 mg/dL     Glucose Whole Blood - POCT [[id number]]  (Abnormal) Collected: 04/24/20 2054     Updated: 04/24/20 2103     Whole Blood Glucose POCT 206 mg/dL     Glucose Whole Blood - POCT [[id number]]  (Abnormal) Collected: 04/24/20 1628     Updated: 04/24/20 1636     Whole Blood Glucose POCT 221 mg/dL     Glucose Whole Blood - POCT [[id number]]  (Abnormal) Collected: 04/24/20 1151     Updated: 04/24/20 1155  Whole Blood Glucose POCT 203 mg/dL                Rads:   Radiological Procedure reviewed.  Radiology Results (24 Hour)      ** No results found for the last 24 hours. **              Assessment and Plan:     87yo F with prior traumatic brain bleed, NPH, HTN, DM, HLD, hypothyroidism, RA, and osteoporosis presented after syncopal event and was found to have aspiration pneumonia thought to be secondary to seizures    #Rehab  - continue PT/OT/SLP  - 1/13 patient was discussed this morning in team conference, see medilinks notes for details  - 1/20 she was discussed this morning in team conference, see medilinks notes for details  - 1/24 patient returned from the ED yesterday after being diagnosed with viral gastroenteritis; appears she has had a bit of a functional decline and will likely try to extend patient's stay an additional 2-3 days to address this    #New onset seizures  - patient with syncopal episode in assisted living facility and then seizure activity while in ED  - spot EEG negative  - CT head and MRI brain negative for acute intracranial process  - seen by neurology, Dr. Marney Doctor while in acute hospital  - seizure precautions  - continue with keppra 500mg  BID    #Hx of traumatic brain bleed/NPH  - CT and MRI showing chronic changes only; without evidence of hydrocephalus  - does have some cognitive impairments that may be secondary to this prior injury  - SLP will be working with patient on cognition    #Viral Gastroenteritis  - patient was sent to the ED on 1/21 and returned 1/23; underwent extensive sepsis workup and was diagnosed with viral gastroenteritis. Conservative management    #Aspiration pneumonia  - thought to be secondary to seizure activity  - was on zosyn while in acute hospital and transitioned to augmentin to complete 5 day course on admission to rehab, stop date 1/16  - probiotic, incentive spirometer, duonebs PRN  - will need repeat CXR in 4-6 weeks for resolution  - 1/17 patient has completed abx, no current signs/symptoms of infection    #HTN  - amlodipine 10mg   - 1/25 BP at goal    #DM  - HbA1c 7.6  - per  daughter the patient's blood sugar elevates when she is stressed and then it returns to normal, decision was made to hold off on starting an oral agent  - continue SSI  - 1/25 blood sugar remains elevated but daughter prefers to not have patient on standing medications    #HLD  - atorvastatin 40mg     #Hypothyroidism  - levothyroxine    #Anemia  - likely secondary to chronic disease  - Hb 10.5 on admission, continue to monitor with intermittent cbc  - 1/20 Hb 9.6  - 1/23 Hb 9.0    #T7 compression fracture  - age indeterminate with no acute findings on exam, continue to monitor    #Depression  - zoloft 50mg     #Rheumatoid arthritis  - on methotrexate qweekly, can restart after d/c from rehab    #Osteoporosis  - citracal-vitamin D 2 tab daily    #DVT ppx  - ASA 325    #Dispo  EDD 1/25 - likely extend to the end of the week given decline     Patient Active Problem List  Diagnosis    Fall, initial encounter    Seizure after head injury    Debility    Sepsis       Continue comprehensive and intensive inpatient rehab program, including:   Physical therapy 60-120 min daily, 5-6 times per week, Occupational therapy 60-120 min daily, 5-6 times per week, speech therapy 60-120 min daily, 5-6 times per week, Case management and Rehabilitation nursing    Signed by: Charlestine Massed, MD MD    [facility]    If there are questions or concerns about the content of this note or information contained within the body of this dictation they should be addressed directly with the author for clarification.

## 2020-04-25 NOTE — Rehab Progress Note (Medilinks) (Signed)
[name]  MRN: [id number]  Account: [id number]  Session Start: 04/25/2020 11:00:00 AM  Session Stop: 04/25/2020 12:00:00 PM    Total Treatment Minutes: 60.00 Minutes    Occupational Therapy  Inpatient Rehabilitation Progress Note - Brief    Rehab Diagnosis: Closed head injury after a fall, Mild anterior wedge  compression fracture of T7  Demographics:            Age: 24Y            Gender: Female  Rehabilitation Precautions/Restrictions:   Fall precaution, skin precaution, seizure precaution, HOH, impaired vision,  delayed mental processing    SUBJECTIVE  Patient Report: "I like watching [place]"  Pain: Patient currently without complaints of pain.    OBJECTIVE  Vital Signs:                       Before Activity              After Activity  Vitals  Position/Activity    seated upright in a wc       -  BP Systolic          146                          -  BP Diastolic         61                           -  Pulse                63                           -  O2 Saturation        98%                          -    Interventions:       Self Care/Home Management:  Pt participated in toilet and mobility training  to improve pt's ability to maintain safety and reduce risk of falls at home. Pt  demonstrated good carry over of hand and feet placement and the use of AD during  mobility tasks. Pt completed short household functional mobility task and all  toileting/grooming tasks with supervision using a FWW. Discussed the importance  of reducing clutter in her home environment, and the need for 24/7 supervision  d/t baseline vision and hearing deficits. Pt reported she usually spend most of  her waking hours watching TV mainly "[place]". Recommended using iPad vs TV  since she reported having visual difficulties. Provided education and training  on using her iPad in order to improve her leisure skills.  Patient was left seated in chair in his/her room. Handoff to nurse completed.  Chair alarm in place and  activated.  Oriented to call bell and placed within reach.  Personal items within reach.  Assistive devices positioned out of reach.  Pain Reassessment: Pain was not reassessed as no pain was reported.    Education Provided:    Education Provided: Activities of daily living. Functional transfers. Safety  issues and interventions. Supervision requirements. Use of adaptive devices.  Equipment.       Audience: Patient.       Mode: Explanation.  Demonstration.  Computer-based.  Response: Applied knowledge.  Verbalized understanding.  Demonstrated skill.  Needs practice.    ASSESSMENT  Pt tolerated therapy session well today as she demonstrated good carry over of  learned safety techniques and use of equipment following training. Pt continues  to require occ vc d/t her current visual deficits (baseline), HOH, and increased  fatigue. As a result, she will require 24/7 supervision to ensure safety and  minimize falls following IR stay. Pt will benefit from family training session  to ensure supervision will be provided to pt upon return to her apartment in  [place].    CARE Tool  Self-Care Functional Assessment    Toileting Hygiene: 04 - Supervision: Helper provides supervision for safety or  verbal cues ONLY  Toileting Equipment:   Toilet  Assistive Device(s):   Adaptive device to maintain balance, Grab bar      Mobility Functional Assessment    Toilet Transfer: 04 - Supervision: Helper provides supervision for safety or  verbal cues ONLY  Assistive Device(s):   Grab bars, Rolling walker      PLAN  Continued Occupational Therapy is recommended.  Recommended Frequency/Duration/Intensity: 60-120 min/day, 5-6 days/week, for  approx 10-14 days from evaluation on 04/13/2019; 1:1 therapy or group treatment  as appropriate.  Continued Activities Contributing Toward Care Plan: ADL/IADL retraining, ther  ex, ther act, modalities, DME recommendations, d/c planning, equipment, family  education/training, functional  mobility/transfer training, energy conservation,  balance training, fall prevention training, functional activity tolerance  training, NMRE, cognitive retraining, ARMEO, RTI, Bioness    3 Hour Rule Minutes: 60 minutes of OT treatment this session count towards  intensity and duration of therapy requirement. Patient was seen for the full  scheduled time of OT treatment this session.  Therapy Mode Minutes: Individual: 60 minutes.    Signed by: Lorie Apley, OT 04/25/2020 12:00:00 PM

## 2020-04-25 NOTE — Rehab Progress Note (Medilinks) (Signed)
Yolanda Cox  MRN: [id number]  Account: [id number]  Session Start: 04/25/2020 12:00:00 AM  Session Stop: 04/25/2020 12:00:00 AM    Total Treatment Minutes:  Minutes    Rehabilitation Nursing  Inpatient Rehabilitation Shift Assessment    Rehab Diagnosis: Closed head injury after a fall, Mild anterior wedge  compression fracture of T7  Demographics:            Age: 54Y            Gender: Female  Primary Language: English    Date of Onset:  04/09/2020  Date of Admission: 04/11/2020 5:40:00 PM    Rehabilitation Precautions Restrictions:   Fall precaution, skin precaution, seizure precaution, HOH, impaired vision,  delayed mental processing    Patient Report: " I'm ok, I dont have pain"  Patient/Caregiver Goals:  "to get better"    Wounds/Incisions: See Epic    Medication Review: No clinically significant medication issues identified this  shift.    Bowel and Bladder Output:                       Bladder (# only)             Bowel (# only)  Number of Episodes  Continent            1                            0  Incontinent          -                            0    Additional Education Provided:    Education Provided: Pain management. Medication options. Safety issues and  interventions. Fall protocol. Supervision requirements. Skin/wound care.  Prevention of skin breakdown.       Audience: Patient.       Mode: Explanation.       Response: Verbalized understanding.    TEAM CARE PLAN  Identified problems from team documentation:  Problem: Impaired Cognition  Cognition: Primary Team Goal: Pt will answer orientation and biographical  questions with >90% accuracy given min cuing in order to improve memory./    Problem: Impaired Leisure Skills  Leisure Skills: Primary Team Goal: Pt and caregiver will describe opportunities  and resources for social engagement in the community./    Problem: Impaired Mobility  Mobility: Primary Team Goal: Pt will correctly demonstrate safe sit<>stand  transfers and ambulation using FWW  under direct SUPV or touch assist via  clinical team./Active    Problem: Impaired Self-care Mgmt/ADL/IADL  Self Care: Primary Team Goal: Pt will complete toilet transfers and all  associated tasks with supervision using AD as indicated to ensure safety./    Problem: Safety Risk and Restraint  Safety: Primary Team Goal: Pt. will recognize limitations and call for  assistance before attempting mobility 100% of the time in order to prevent fall  while in rehab/Active    Please review Integrated Patient View Care Plan Flowsheet for Team identified  Problems, Interventions, and Goals.    Signed by: Tracey Harries, RN 04/25/2020 12:44:00 AM

## 2020-04-25 NOTE — Progress Notes (Signed)
CASE MANAGEMENT PROGRESS NOTE      Patient: Yolanda Cox (87 y.o. female)  Admission Date: 04/11/2020 [facility] Day 14)    Active Hospital Problems    Diagnosis    Debility       Length of stay: Hospital Day 14    The will transition back to IFL with hired caregiver support per her daughter.     The patient's daughter will completed family training tomorrow 04/26/20.     CM will continue to follow for discharge plans and coordination.       Ms. Dallie Piles, MSW   Rehabilitation Case Manager  [facility] Inpatient Acute Re[facility]   [address]   [place], [place] [address]  [place] [phone]

## 2020-04-25 NOTE — Rehab Progress Note (Medilinks) (Signed)
[name]  MRN: [id number]  Account: [id number]  Session Start: 04/24/2020 1:00:00 PM  Session Stop: 04/24/2020 2:00:00 PM    Total Treatment Minutes: 60.00 Minutes    Speech Language Pathology  Inpatient Rehabilitation Progress Note - Brief    Rehab Diagnosis: Closed head injury after a fall, Mild anterior wedge  compression fracture of T7  Demographics:            Age: 60Y            Gender: Female  Rehabilitation Precautions/Restrictions:   Fall precaution, skin precaution, seizure precaution, HOH, impaired vision,  delayed mental processing    SUBJECTIVE  Patient Report: "I try so hard to remember, but I can't."  Pain: Patient currently without complaints of pain.    OBJECTIVE    Interventions:       Speech Treatment: O-log completed to assess orientation and determine if  there had been a cognitive decline since ED admission over the weekend. Pt  scored 22/30, needing cuing for city and situation. Pt reporting confusion  regarding timeline of hospitalization and "why" she was sent to a "strange  place" the last several days. SLP provided education on change in medical status  with concern for sepsis, but encouraged her to discuss these details with MD.  [name] of session targeting memory through review of functional concepts  (e.g. daily schedule/therapist names). Using her memory notebook, she was able  to recall 2/4 activities completed this AM independently, increasing to 4/4  given min-mod cuing. SLP assisted pt in recording these details in her notebook  for future reference.    Patient was left seated in chair in his/her room. Handoff to nurse completed.  Chair alarm in place and activated.  Oriented to call bell and placed within reach.  Personal items within reach.  Assistive devices positioned out of reach.  Pain Reassessment: Pain was not reassessed as no pain was reported.    Education Provided:    Education Provided: Plan of care. Cognitive functioning. Compensatory  cognitive  strategies/aids       Audience: Patient.       Mode: Explanation.  Demonstration.  Teacher, English as a foreign language provided.       Response: Verbalized understanding.    ASSESSMENT  Slight decline in cognition since ED admission, scoring 2-pts less on O-log  compared to previous administration. Pt with little awareness of change in  medical status despite education from team members throughout the day, showing  little carry over of new information. Pt with planned d/c for tomorrow; however  pt would benefit from extended stay x2-3 days to improve cognition and safety to  a more functional level.    PLAN  Continued Speech Language Pathology is recommended to address:  Recommended Frequency/Duration/Intensity: 60-120 minutes 1-2x day, 5-6x week,  for 10-14 days  Continued Activities Contributing Toward Care Plan: dynamic assessment,  neuro-cognitive retraining, memory strategies, finance/medication management  exercises, external aids, brain injury education, family training, and d/c  planning    3 Hour Rule Minutes: 60 minutes of SLP treatment this session count towards  intensity and duration of therapy requirement. Patient was seen for the full  scheduled time of SLP treatment this session.  Therapy Mode Minutes: Individual: 60 minutes.    Signed by: Mikael Spray, CCC/SLP 04/24/2020 2:00:00 PM

## 2020-04-25 NOTE — Rehab Progress Note ([facility]) (Signed)
Yolanda Cox  MRN: [id number]  Account: [id number]  Session Start: 04/25/2020 3:30:00 PM  Session Stop: 04/25/2020 4:30:00 PM    Total Treatment Minutes: 60.00 Minutes    Physical Therapy  Inpatient Rehabilitation Progress Note - Brief    Rehab Diagnosis: Closed head injury after a fall, Mild anterior wedge  compression fracture of T7  Demographics:            Age: 28Y            Gender: Female  Rehabilitation Precautions/Restrictions:   Fall precaution, skin precaution, seizure precaution, HOH, impaired vision,  delayed mental processing    SUBJECTIVE  Patient Report: Pt received supine in hospital bed, bed alarm engaged, needs  within reach. Pt agreeable to PT tx. Daughter at bedside. Daughter confirmed pt  with FWW and 4WW available at home. Pt with standard bed frame set-up at home.  Pain: Patient currently without complaints of pain.    OBJECTIVE  Vital Signs:                       Before Activity              After Activity  Vitals  Position/Activity    sitting                      -  BP Systolic          134                          -  BP Diastolic         67                           -  Pulse                63                           -  O2 Saturation        98% on RA                    -    Interventions:       Therapeutic Activities:  PT session with focus on informal family training  with daughter [name]) in preparation for discharge home this week. Bed mobility:  With bed flat and bed rails removed to simulate home set-up--Pt SUPV for  supine<>sit. Pt with improved mechanics and sequencing via bed exit R versus L.  Supine<>sit completed x 2 trials. Transfers: Random, varied surface transfer  training (sit<>stand and stand-step-turn transfers) using FWW and 4WW at [facility]  with cues to scoot to edge of seated surface and to lock/unlock 4WW brakes (EOB,  standard arm chairs, low seat height arm chairs, standard toilet). Gait  training: 1 x 150 feet using FWW, 1 x 100 feet using FWW, 1 x 250 feet  using  4WW, 1 x 150 feet using 4WW at SUPV level with VCs to promote spinal extension  for erect standing posture, heel strike initial contact and increase step height  to prevent shuffling.      10 Meter Walk Test  Gait speed in meters/second: 0.36 meters per second  Assistive devices or braces used: FWW  Assist required to complete test: SUPV  Additional comments:   gait speed avg  of 4 trials    Patient was returned to or left in bed.   daughter at bedside Handoff to nurse completed.  Bed alarm in place and activated.  Oriented to call bell and placed within reach.  Personal items within reach.  Assistive devices positioned out of reach.  Bed placed in lowest position.  Pain Reassessment: Pain was not reassessed as no pain was reported.    Education Provided:    Education Provided: Bed mobility. Functional transfers. Fall prevention/balance  training. Safety. Gait. Equipment. Discharge planning as part of the  interdisciplinary team .       Audience: Patient and Daughter [name]) .       Mode: Explanation.  Demonstration.       Response: Applied knowledge.  Verbalized understanding.  Demonstrated skill.   patient with memory deficits and attention limitations impacting safety recall;  daughter verbalized understanding to education provided during session    ASSESSMENT  Pt demonstrates a 71% increase in self-selected gait speed today compared to  yesterday (0.21 m/s to 0.36 m/s), indicative of household mobility, but limited  community ambulation at this time. Pt continues to benefit from SUPV due to L  visual inattention and intermittent shuffling gait pattern with forward flexed  trunk. Pt is able to correct, however, demonstrate decreased insight to deficits  resulting in limited capacity for self correction. Daughter present and  participatory in PT treatment session to include functional transfer and gait  training. Daughter verbalized and demonstrated safe pt handling skills during  session and demonstrated  capacity to assist pt back home to senior living  facility with hired CG assistance. Plan for additional family training tomorrow  (04/26/2020) for car transfer training and community surface gait training in  preparation for discharge this week.      PLAN  Continued Physical Therapy is recommended.  Recommended Frequency/Duration/Intensity: 60-120 minutes, 5-7 days a week for  10-14 days from IE on 04/12/2020.  D/C set for 04/25/2020. PT recommending additional 2-3 days of IP Rehab prior to  D/C 2/2 to recent admission to ER for gastric sepsis.  Activities Contributing Toward Care Plan: TherActivities, TherEx, Transfer  training, gait training, static and dynamic balance training in sitting and  standing, cardiovascular endurance training, B LE strength training. Pt will  benefit from skilled care to improve safety, decrease fall risk, and reduce  caregiver burden, [place] planning as part of the interdisciplinary team.    3 Hour Rule Minutes: 60 minutes of PT treatment this session count towards  intensity and duration of therapy requirement. Patient was seen for the full  scheduled time of PT treatment this session.  Therapy Mode Minutes: Individual: 60 minutes.    Pt seen from 1530-1400 and 1600-1630 for scheduled PT session    Signed by: Juluis Pitch, PT 04/25/2020 4:30:00 PM

## 2020-04-25 NOTE — Rehab Progress Note ([facility]) (Signed)
Yolanda Cox  MRN: [id number]  Account: [id number]  Session Start: 04/25/2020 12:00:00 AM  Session Stop: 04/25/2020 12:00:00 AM    Total Treatment Minutes:  Minutes    Rehabilitation Nursing  Inpatient Rehabilitation Shift Assessment    Rehab Diagnosis: Closed head injury after a fall, Mild anterior wedge  compression fracture of T7  Demographics:            Age: 23Y            Gender: Female  Primary Language: English    Date of Onset:  04/09/2020  Date of Admission: 04/11/2020 5:40:00 PM    Rehabilitation Precautions Restrictions:   Fall precaution, skin precaution, seizure precaution, HOH, impaired vision,  delayed mental processing    Patient Report: " I am ok "  Patient/Caregiver Goals:  "to get better"    Wounds/Incisions: See Epic    Medication Review: No clinically significant medication issues identified this  shift.    Bowel and Bladder Output:                       Bladder (# only)             Bowel (# only)  Number of Episodes  Continent            3                            0  Incontinent          0                            0    Additional Education Provided:    Education Provided: Precautions. Pain management. Pain scale. Medication  options. Plan of care. Safety issues and interventions. Fall protocol.  Supervision requirements. Use of adaptive devices. Skin/wound care. Prevention  of skin breakdown. Safety. Medication. Name and dosage. Administration. Purpose.  Diabetes. Hypoglycemia causes/symptoms/treatment. Hyperglycemia  causes/symptoms/treatment.       Audience: Patient.       Mode: Explanation.       Response: Verbalized understanding.    TEAM CARE PLAN  Identified problems from team documentation:  Problem: Impaired Cognition  Cognition: Primary Team Goal: Pt will answer orientation and biographical  questions with >90% accuracy given min cuing in order to improve memory./    Problem: Impaired Leisure Skills  Leisure Skills: Primary Team Goal: Pt and caregiver will describe  opportunities  and resources for social engagement in the community./    Problem: Impaired Mobility  Mobility: Primary Team Goal: Pt will correctly demonstrate safe sit<>stand  transfers and ambulation using FWW under direct SUPV or touch assist via  clinical team./Active    Problem: Impaired Self-care Mgmt/ADL/IADL  Self Care: Primary Team Goal: Pt will complete toilet transfers and all  associated tasks with supervision using AD as indicated to ensure safety./    Problem: Safety Risk and Restraint  Safety: Primary Team Goal: Pt. will recognize limitations and call for  assistance before attempting mobility 100% of the time in order to prevent fall  while in rehab/Active    Please review Integrated Patient View Care Plan Flowsheet for Team identified  Problems, Interventions, and Goals.    Signed by: Judie Petit, RN 04/25/2020 12:24:00 PM

## 2020-04-26 LAB — CBC AND DIFFERENTIAL
Absolute NRBC: 0 10*3/uL (ref 0.00–0.00)
Basophils Absolute Automated: 0.07 10*3/uL (ref 0.00–0.08)
Basophils Automated: 0.7 %
Eosinophils Absolute Automated: 0.03 10*3/uL (ref 0.00–0.44)
Eosinophils Automated: 0.3 %
Hematocrit: 30.2 % — ABNORMAL LOW (ref 34.7–43.7)
Hgb: 9.9 g/dL — ABNORMAL LOW (ref 11.4–14.8)
Immature Granulocytes Absolute: 1.05 10*3/uL — ABNORMAL HIGH (ref 0.00–0.07)
Immature Granulocytes: 10.1 %
Lymphocytes Absolute Automated: 1.41 10*3/uL (ref 0.42–3.22)
Lymphocytes Automated: 13.6 %
MCH: 31.5 pg (ref 25.1–33.5)
MCHC: 32.8 g/dL (ref 31.5–35.8)
MCV: 96.2 fL — ABNORMAL HIGH (ref 78.0–96.0)
MPV: 11 fL (ref 8.9–12.5)
Monocytes Absolute Automated: 2.14 10*3/uL — ABNORMAL HIGH (ref 0.21–0.85)
Monocytes: 20.6 %
Neutrophils Absolute: 5.67 10*3/uL (ref 1.10–6.33)
Neutrophils: 54.7 %
Nucleated RBC: 0 /100 WBC (ref 0.0–0.0)
Platelets: 235 10*3/uL (ref 142–346)
RBC: 3.14 10*6/uL — ABNORMAL LOW (ref 3.90–5.10)
RDW: 16 % — ABNORMAL HIGH (ref 11–15)
WBC: 10.37 10*3/uL — ABNORMAL HIGH (ref 3.10–9.50)

## 2020-04-26 LAB — GLUCOSE WHOLE BLOOD - POCT
Whole Blood Glucose POCT: 141 mg/dL — ABNORMAL HIGH (ref 70–100)
Whole Blood Glucose POCT: 119 mg/dL — ABNORMAL HIGH (ref 70–100)
Whole Blood Glucose POCT: 115 mg/dL — ABNORMAL HIGH (ref 70–100)
Whole Blood Glucose POCT: 189 mg/dL — ABNORMAL HIGH (ref 70–100)

## 2020-04-26 LAB — BASIC METABOLIC PANEL
Anion Gap: 10 (ref 5.0–15.0)
BUN: 15 mg/dL (ref 7.0–19.0)
CO2: 24 mEq/L (ref 21–29)
Calcium: 9.4 mg/dL (ref 7.9–10.2)
Chloride: 108 mEq/L (ref 100–111)
Creatinine: 0.7 mg/dL (ref 0.4–1.5)
Glucose: 137 mg/dL — ABNORMAL HIGH (ref 70–100)
Potassium: 3.4 mEq/L — ABNORMAL LOW (ref 3.5–5.1)
Sodium: 142 mEq/L (ref 136–145)

## 2020-04-26 LAB — GFR: EGFR: >60

## 2020-04-26 LAB — HEMOLYSIS INDEX: Hemolysis Index: 1 (ref 0–24)

## 2020-04-26 MED ORDER — POTASSIUM CHLORIDE CRYS ER 20 MEQ PO TBCR
40.0000 meq | EXTENDED_RELEASE_TABLET | Freq: Once | ORAL | Status: AC
Start: 2020-04-26 — End: 2020-04-26
  Administered 2020-04-26: 11:00:00 40 meq via ORAL
  Filled 2020-04-26: qty 2

## 2020-04-26 NOTE — Rehab Progress Note ([facility]) (Signed)
[name]  MRN: [id number]  Account: [id number]  Session Start: 04/26/2020 2:00:00 PM  Session Stop: 04/26/2020 3:00:00 PM    Total Treatment Minutes: 60.00 Minutes    Occupational Therapy  Inpatient Rehabilitation Note - Family Training    Rehab Diagnosis: Closed head injury after a fall, Mild anterior wedge  compression fracture of T7  Demographics:            Age: 77Y            Gender: Female  Rehabilitation Precautions/Restrictions:   Fall precaution, skin precaution, seizure precaution, HOH, impaired vision,  delayed mental processing    SUBJECTIVE  Patient Report: "I tend to sleep a lot"  Pain: Patient currently without complaints of pain.    OBJECTIVE  Vital Signs:                       Before Activity              After Activity  Vitals  Position/Activity    seated upright in a wc       -  BP Systolic          124                          -  BP Diastolic         73                           -  Pulse                56                           -  O2 Saturation        98%                          -    Interventions:       Therapeutic Activities:  Pt and pt's daughter participated in formal family  training session that included a thorough review of pt's ability to perform  MRADLs, use of AD, and recommended equipment to be used at her apartment.  Discussed the need for 24/7 supervision d/t visual, hearing deficits and  cognitive impairments. Pt's daughter demonstrated ability to supervise pt during  a simulation of a shower task with no physical assistance provided. Pt was able  to locate items in her environment and respond well to feedback, however,  required occ vc to maintain safety (i.e. correct hand feet placement and  locking/unlocking her chair prior and during a functional transfers. Pt's  daughter reported she has no concerns of keeping current [place] date as she hired a  care giver to be with pt from 12-5 and will be supplemented by family members  until another solution will be  found.  Patient was left seated in chair in his/her room. Handoff to nurse completed.  Chair alarm in place and activated.  Oriented to call bell and placed within reach.  Personal items within reach.  Assistive devices positioned out of reach.    Equipment Provided/Ordered/Recommended: The patient has been given voluntary  selection of a medical equipment provider, as per federal law. Arrangements have  been made with a company of the patient?s choosing. The patient has been  educated that choosing a  company outside of [employer] company?s preferred  provider may affect their insurance coverage.The patient has been provided with  information on care of equipment as appropriate.   shower chair, grab bar, hand held shower, grab bars around the toilet    Education Provided:    Education Provided: Functional transfers. Activities of daily living. Fall  prevention/balance training.       Audience: Patient.       Mode: Explanation.  Demonstration.       Response: Applied knowledge.  Verbalized understanding.  Demonstrated skill.  Needs practice.    ASSESSMENT  Pt's daughter demonstrated good understanding of the need for 24/7 supervision.  She responded well to feedback and confirmed pt has all recommended DME at pt's  apartment. Pt will benefit from ADL review to assess progress and make further  recommendations in preparation for her upcoming [place].    PLAN  Continued Occupational Therapy is recommended.  Recommended Frequency/Duration/Intensity: 60-120 min/day, 5-6 days/week, for  approx 10-14 days from evaluation on 04/13/2019; 1:1 therapy or group treatment  as appropriate.  Continued Activities Contributing Toward Care Plan: ADL/IADL retraining, ther  ex, ther act, modalities, DME recommendations, d/c planning, equipment, family  education/training, functional mobility/transfer training, energy conservation,  balance training, fall prevention training, functional activity tolerance  training, NMRE, cognitive  retraining, ARMEO, RTI, [employer]    3 Hour Rule Minutes: 60 minutes of OT treatment this session count towards  intensity and duration of therapy requirement. Patient was seen for the full  scheduled time of OT treatment this session.  Therapy Mode Minutes: Individual: 60 minutes.    Signed by: Lorie Apley, OT 04/26/2020 3:00:00 PM

## 2020-04-26 NOTE — Rehab Progress Note ([facility]) (Signed)
Yolanda Cox  MRN: [id number]  Account: [id number]  Session Start: 04/26/2020 12:00:00 AM  Session Stop: 04/26/2020 12:00:00 AM    Total Treatment Minutes:  Minutes    Rehabilitation Nursing  Inpatient Rehabilitation Shift Assessment    Rehab Diagnosis: Closed head injury after a fall, Mild anterior wedge  compression fracture of T7  Demographics:            Age: 57Y            Gender: Female  Primary Language: English    Date of Onset:  04/09/2020  Date of Admission: 04/11/2020 5:40:00 PM    Rehabilitation Precautions Restrictions:   Fall precaution, skin precaution, seizure precaution, HOH, impaired vision,  delayed mental processing    Patient Report: I am good.  Patient/Caregiver Goals:  "to get better"    Wounds/Incisions: See Epic    Medication Review: No clinically significant medication issues identified this  shift.    Bowel and Bladder Output:                       Bladder (# only)             Bowel (# only)  Number of Episodes  Continent            3                            0  Incontinent          0                            0    Additional Education Provided:    Education Provided: Precautions. Plan of care. Safety issues and interventions.  Fall protocol. Supervision requirements. Medication. Name and dosage.  Administration. Purpose. Side Effects.       Audience: Patient.       Mode: Explanation.       Response: Verbalized understanding.    TEAM CARE PLAN  Identified problems from team documentation:  Problem: Impaired Cognition  Cognition: Primary Team Goal: Pt will answer orientation and biographical  questions with >90% accuracy given min cuing in order to improve memory./    Problem: Impaired Leisure Skills  Leisure Skills: Primary Team Goal: Pt and caregiver will describe opportunities  and resources for social engagement in the community./    Problem: Impaired Mobility  Mobility: Primary Team Goal: Pt will correctly demonstrate safe sit<>stand  transfers and ambulation using FWW under  direct SUPV or touch assist via  clinical team./Active    Problem: Impaired Self-care Mgmt/ADL/IADL  Self Care: Primary Team Goal: Pt will complete toilet transfers and all  associated tasks with supervision using AD as indicated to ensure safety./    Problem: Safety Risk and Restraint  Safety: Primary Team Goal: Pt. will recognize limitations and call for  assistance before attempting mobility 100% of the time in order to prevent fall  while in rehab/Active    Please review Integrated Patient View Care Plan Flowsheet for Team identified  Problems, Interventions, and Goals.    Signed by: Boris Lown, RN 04/26/2020 6:36:00 PM

## 2020-04-26 NOTE — Rehab Progress Note ([facility]) (Signed)
Yolanda Cox  MRN: [id number]  Account: [id number]  Session Start: 04/25/2020 8:00:00 AM  Session Stop: 04/25/2020 9:00:00 AM    Total Treatment Minutes: 60.00 Minutes    Speech Language Pathology  Inpatient Rehabilitation Progress Note - Brief    Rehab Diagnosis: Closed head injury after a fall, Mild anterior wedge  compression fracture of T7  Demographics:            Age: 73Y            Gender: Female  Rehabilitation Precautions/Restrictions:   Fall precaution, skin precaution, seizure precaution, HOH, impaired vision,  delayed mental processing    SUBJECTIVE  Patient Report: "I shouldn't go down the steps" re: use of walker.  Pain: Patient currently without complaints of pain.    OBJECTIVE    Interventions:       Speech Treatment: SLP reviewed medical timeline with pt to target memory of  hospitalization, specifically pertaining to recent transfer to ED (on 1/21) for  decline in medical status. Patient stating that she does not recall why she was  sent to the ED and required max verbal reminders to recall details.  Safety  cards utilized to target problem solving and safety awareness. Patient required  mod-max cuing to identify/recognize safety challenges and generate various  solutions. Mass practice of sit-to-stand sequence completed to target memory and  sequencing. Following review, pt able to sequence task with 100% accuracy given  min cuing.        Patient was left seated in chair in his/her room. Handoff to nurse completed.  Chair alarm in place and activated.  Oriented to call bell and placed within reach.  Personal items within reach.  Assistive devices positioned out of reach.    Direct supervision provided by Yolanda Spray, MA CCC-SLP    Pain Reassessment: Pain was not reassessed as no pain was reported.    Education Provided:    Education Provided: Plan of care. Compensatory cognitive strategies/aids  Cognitive functioning.       Audience: Patient.       Mode: Explanation.       Response:  Verbalized understanding.  Needs practice.  Needs reinforcement.    ASSESSMENT  Patient with reduced memory, orientation, and safety awareness s/p recent  illness; however suspect pt is not far from baseline. Patient benefits from  visual aids and continued use of mass repetition of pertinent sequences/behavior  to maintain safety. Cuing continues to be required for functional mobility  tasks, therefore will need 24/7 supervision upond d/c home.  Recommend continued  targeting of orientation, memory and problem solving to support safety in the  home post d/c.        PLAN  Continued Speech Language Pathology is recommended to address:  Recommended Frequency/Duration/Intensity: 60-120 minutes 1-2x day, 5-6x week,  for 10-14 days  Continued Activities Contributing Toward Care Plan: dynamic assessment,  neuro-cognitive retraining, memory strategies, finance/medication management  exercises, external aids, brain injury education, family training, and d/c  planning    3 Hour Rule Minutes: 60 minutes of SLP treatment this session count towards  intensity and duration of therapy requirement. Patient was seen for the full  scheduled time of SLP treatment this session.  Therapy Mode Minutes: Individual: 60 minutes.    Signed by: Yolanda Cox, SLP Graduate Student 04/25/2020 9:00:00 AM     - CoSigned By: Yolanda Cox, CCC/SLP 04/26/2020 4:32:56 AM

## 2020-04-26 NOTE — Rehab Progress Note ([facility]) (Signed)
NAMEGRACIOUS Cox  MRN: [id number]  Account: [id number]  Session Start: 04/26/2020 12:00:00 AM  Session Stop: 04/26/2020 12:00:00 AM    Total Treatment Minutes:  Minutes    Rehabilitation Nursing  Inpatient Rehabilitation Shift Assessment    Rehab Diagnosis: Closed head injury after a fall, Mild anterior wedge  compression fracture of T7  Demographics:            Age: 89Y            Gender: Female  Primary Language: English    Date of Onset:  04/09/2020  Date of Admission: 04/11/2020 5:40:00 PM    Rehabilitation Precautions Restrictions:   Fall precaution, skin precaution, seizure precaution, HOH, impaired vision,  delayed mental processing    Patient Report: " I'm ok, I dont have pain"  Patient/Caregiver Goals:  "to get better"    Wounds/Incisions: See Epic    Medication Review: No clinically significant medication issues identified this  shift.    Bowel and Bladder Output:                       Bladder (# only)             Bowel (# only)  Number of Episodes  Continent            1                            0  Incontinent          1                            0    Additional Education Provided:    Education Provided: Pain management. Medication options. Safety issues and  interventions. Fall protocol. Supervision requirements. Skin/wound care. Skin  care for the incontinent patient. Prevention of skin breakdown.       Audience: Patient.       Mode: Explanation.       Response: Verbalized understanding.    TEAM CARE PLAN  Identified problems from team documentation:  Problem: Impaired Cognition  Cognition: Primary Team Goal: Pt will answer orientation and biographical  questions with >90% accuracy given min cuing in order to improve memory./    Problem: Impaired Leisure Skills  Leisure Skills: Primary Team Goal: Pt and caregiver will describe opportunities  and resources for social engagement in the community./    Problem: Impaired Mobility  Mobility: Primary Team Goal: Pt will correctly demonstrate safe  sit<>stand  transfers and ambulation using FWW under direct SUPV or touch assist via  clinical team./Active    Problem: Impaired Self-care Mgmt/ADL/IADL  Self Care: Primary Team Goal: Pt will complete toilet transfers and all  associated tasks with supervision using AD as indicated to ensure safety./    Problem: Safety Risk and Restraint  Safety: Primary Team Goal: Pt. will recognize limitations and call for  assistance before attempting mobility 100% of the time in order to prevent fall  while in rehab/Active    Please review Integrated Patient View Care Plan Flowsheet for Team identified  Problems, Interventions, and Goals.    Signed by: Tracey Harries, RN 04/26/2020 1:07:00 AM

## 2020-04-26 NOTE — Progress Notes (Signed)
PHYSICAL MEDICINE AND REHABILITATION  PROGRESS NOTE -- FACE-TO-FACE ENCOUNTER    Date Time: 04/26/20 10:49 AM  Patient Name: Yolanda Cox, Yolanda Cox    Admission date:  04/11/2020    Subjective:     No acute events overnight. Patient feels well today. Says she slept well last night and is looking forward to family training this afternoon. No current complaints.      Functional Status:     PT:  Pt demonstrates a 71% increase in self-selected gait speed today compared to  yesterday (0.21 m/s to 0.36 m/s), indicative of household mobility, but limited  community ambulation at this time. Pt continues to benefit from SUPV due to L  visual inattention and intermittent shuffling gait pattern with forward flexed  trunk. Pt is able to correct, however, demonstrate decreased insight to deficits  resulting in limited capacity for self correction. Daughter present and  participatory in PT treatment session to include functional transfer and gait  training. Daughter verbalized and demonstrated safe pt handling skills during  session and demonstrated capacity to assist pt back home to senior living  facility with hired CG assistance. Plan for additional family training tomorrow  (04/26/2020) for car transfer training and community surface gait training in  preparation for discharge this week.    OT:  Pt tolerated therapy session well today as she demonstrated good carry over of  learned safety techniques and use of equipment following training. Pt continues  to require occ vc d/t her current visual deficits (baseline), HOH, and increased  fatigue. As a result, she will require 24/7 supervision to ensure safety and  minimize falls following IR stay. Pt will benefit from family training session  to ensure supervision will be provided to pt upon return to her apartment in  [place].      SLP:  Slight decline in cognition since ED admission, scoring 2-pts less on O-log  compared to previous administration. Pt with little awareness  of change in  medical status despite education from team members throughout the day, showing  little carry over of new information. Pt with planned d/c for tomorrow; however  pt would benefit from extended stay x2-3 days to improve cognition and safety to  a more functional level.    Medications:   Medication reviewed by me:     Scheduled Meds: PRN Meds:   amLODIPine, 10 mg, Oral, Daily  aspirin, 325 mg, Oral, Daily  atorvastatin, 40 mg, Oral, QHS  calcium citrate-vitamin D, 2 tablet, Oral, Daily  folic acid, 1 mg, Oral, Daily  insulin lispro, 1-3 Units, Subcutaneous, QHS  insulin lispro, 1-5 Units, Subcutaneous, TID AC  lactobacillus/streptococcus, 1 capsule, Oral, Daily  levETIRAcetam, 500 mg, Oral, Q12H [facility]  levothyroxine, 75 mcg, Oral, Daily at 0600  multivitamin, 1 tablet, Oral, Daily  potassium chloride, 40 mEq, Oral, Once  sertraline, 50 mg, Oral, Daily  vitamin D, 25 mcg, Oral, Daily        Continuous Infusions:   acetaminophen, 650 mg, Q6H PRN  albuterol-ipratropium, 3 mL, Q6H PRN  butalbital-acetaminophen-caffeine, 1 tablet, Q4H PRN  dextrose, 15 g of glucose, PRN   And  dextrose, 12.5 g, PRN   And  glucagon (rDNA), 1 mg, PRN  magnesium hydroxide, 30 mL, Daily PRN  melatonin, 3 mg, QHS PRN  ondansetron, 4 mg, Q8H PRN  senna-docusate, 2 tablet, QHS PRN          Medication Review  1. A complete drug regimen review was completed: Yes  2. Were  any drug issues found during review?: No      Review of Systems:   A comprehensive review of systems was: No fevers, chills, nausea, vomiting, chest pain, shortness of breath, cough, headache, double vision.  All others negative.    Physical Exam:     Vitals:    04/25/20 1446 04/25/20 1636 04/26/20 0501 04/26/20 0845   BP: 134/67 133/62 144/66 143/68   Pulse: 62 64 (!) 59    Resp:  12 17    Temp:  97.3 F (36.3 C) 97.5 F (36.4 C)    TempSrc:  Oral Oral    SpO2: 98% 97% 96%    Weight:       Height:           Intake and Output Summary (Last 24 hours) at Date  Time    Intake/Output Summary (Last 24 hours) at 04/26/2020 1049  Last data filed at 04/25/2020 1955  Gross per 24 hour   Intake 60 ml   Output 0 ml   Net 60 ml     P.O.: 60 mL (04/25/20 1955)     Urine: 0 mL (04/25/20 1955)           General: WN/WD, in NAD, seen lying comfortably in bed  HEENT: NC/AT, moist mucosal membranes, anicteric sclera  Cardiac: regular rate, extremities warm and well perfused  Resp: non-labored breathing, acyanotic  Abdomen: soft, non-tender, non-distended  Neuro: Awake and alert, oriented to person and place, knows the month and year but not date, speech fluent and spontaneous without dysarthria but is slowed, mild proximal weakness but moves extremities with AG strength throughout    Labs:   No results for input(s): GLUCOSEWHOLE in the last 24 hours.    Recent Labs   Lab 04/26/20  0510 04/23/20  0357 04/22/20  0350 04/21/20  1528   WBC 10.37* 9.48 18.81* 27.12*   Hgb 9.9* 9.0* 8.8* 10.9*   Hematocrit 30.2* 28.3* 28.0* 33.6*   Platelets 235 198 189 233        Recent Labs   Lab 04/26/20  0510 04/23/20  0357 04/22/20  0350 04/21/20  1528   Sodium 142 139 140 138   Potassium 3.4* 3.8 3.5 3.5   Chloride 108 109 110 104   CO2 24 22 22  20*   BUN 15.0 15.0 12.0 17.0   Creatinine 0.7 0.7 0.7 0.8   Calcium 9.4 8.8 8.6 9.8   Albumin  --   --   --  4.2   Protein, Total  --   --   --  7.7   Bilirubin, Total  --   --   --  0.9   Alkaline Phosphatase  --   --   --  72   ALT  --   --   --  15   AST (SGOT)  --   --   --  23   Glucose 137* 165* 128* 172*             Results     Procedure Component Value Units Date/Time    Glucose Whole Blood - POCT [[id number]]  (Abnormal) Collected: 04/26/20 0741     Updated: 04/26/20 0744     Whole Blood Glucose POCT 141 mg/dL     Basic Metabolic Panel [[id number]]  (Abnormal) Collected: 04/26/20 0510    Specimen: Blood Updated: 04/26/20 0724     Glucose 137 mg/dL      BUN 78.2 mg/dL  Creatinine 0.7 mg/dL      Calcium 9.4 mg/dL      Sodium 540 mEq/L      Potassium 3.4  mEq/L      Chloride 108 mEq/L      CO2 24 mEq/L      Anion Gap 10.0    GFR [[id number]] Collected: 04/26/20 0510     Updated: 04/26/20 0724     EGFR >60.0    Hemolysis index [[id number]] Collected: 04/26/20 0510     Updated: 04/26/20 0724     Hemolysis Index 1    CBC and differential [[id number]]  (Abnormal) Collected: 04/26/20 0510    Specimen: Blood Updated: 04/26/20 0704     WBC 10.37 x10 3/uL      Hgb 9.9 g/dL      Hematocrit 95.2 %      Platelets 235 x10 3/uL      RBC 3.14 x10 6/uL      MCV 96.2 fL      MCH 31.5 pg      MCHC 32.8 g/dL      RDW 16 %      MPV 11.0 fL      Neutrophils 54.7 %      Lymphocytes Automated 13.6 %      Monocytes 20.6 %      Eosinophils Automated 0.3 %      Basophils Automated 0.7 %      Immature Granulocytes 10.1 %      Nucleated RBC 0.0 /100 WBC      Neutrophils Absolute 5.67 x10 3/uL      Lymphocytes Absolute Automated 1.41 x10 3/uL      Monocytes Absolute Automated 2.14 x10 3/uL      Eosinophils Absolute Automated 0.03 x10 3/uL      Basophils Absolute Automated 0.07 x10 3/uL      Immature Granulocytes Absolute 1.05 x10 3/uL      Absolute NRBC 0.00 x10 3/uL     Glucose Whole Blood - POCT [[id number]]  (Abnormal) Collected: 04/25/20 2052     Updated: 04/25/20 2055     Whole Blood Glucose POCT 211 mg/dL     Glucose Whole Blood - POCT [[id number]]  (Abnormal) Collected: 04/25/20 1634     Updated: 04/25/20 1639     Whole Blood Glucose POCT 239 mg/dL     Glucose Whole Blood - POCT [[id number]]  (Abnormal) Collected: 04/25/20 1201     Updated: 04/25/20 1204     Whole Blood Glucose POCT 205 mg/dL                Rads:   Radiological Procedure reviewed.  Radiology Results (24 Hour)     ** No results found for the last 24 hours. **              Assessment and Plan:     87yo F with prior traumatic brain bleed, NPH, HTN, DM, HLD, hypothyroidism, RA, and osteoporosis presented after syncopal event and was found to have aspiration pneumonia thought to be secondary to seizures    #Rehab  - continue  PT/OT/SLP  - 1/13 patient was discussed this morning in team conference, see medilinks notes for details  - 1/20 she was discussed this morning in team conference, see medilinks notes for details  - 1/24 patient returned from the ED yesterday after being diagnosed with viral gastroenteritis; appears she has had a bit of a functional decline and will likely try to extend patient's stay an additional 2-3  days to address this    #New onset seizures  - patient with syncopal episode in assisted living facility and then seizure activity while in ED  - spot EEG negative  - CT head and MRI brain negative for acute intracranial process  - seen by neurology, Dr. Marney Doctor while in acute hospital  - seizure precautions  - continue with keppra 500mg  BID    #Hx of traumatic brain bleed/NPH  - CT and MRI showing chronic changes only; without evidence of hydrocephalus  - does have some cognitive impairments that may be secondary to this prior injury  - SLP will be working with patient on cognition    #Viral Gastroenteritis  - patient was sent to the ED on 1/21 and returned 1/23; underwent extensive sepsis workup and was diagnosed with viral gastroenteritis. Conservative management    #Aspiration pneumonia  - thought to be secondary to seizure activity  - was on zosyn while in acute hospital and transitioned to augmentin to complete 5 day course on admission to rehab, stop date 1/16  - probiotic, incentive spirometer, duonebs PRN  - will need repeat CXR in 4-6 weeks for resolution  - 1/17 patient has completed abx, no current signs/symptoms of infection    #HTN  - amlodipine 10mg   - 1/26 BP at goal    #DM  - HbA1c 7.6  - per daughter the patient's blood sugar elevates when she is stressed and then it returns to normal, decision was made to hold off on starting an oral agent  - continue SSI  - 1/26 blood sugar remains elevated but daughter prefers to not have patient on standing medications    #HLD  - atorvastatin  40mg     #Hypothyroidism  - levothyroxine    #Anemia  - likely secondary to chronic disease  - Hb 10.5 on admission, continue to monitor with intermittent cbc  - 1/20 Hb 9.6  - 1/23 Hb 9.0  - 1/26 Hb 9.9    #Hypokalemia  - 1/26 K 3.4, gave KCl    #T7 compression fracture  - age indeterminate with no acute findings on exam, continue to monitor    #Depression  - zoloft 50mg     #Rheumatoid arthritis  - on methotrexate qweekly, can restart after d/c from rehab    #Osteoporosis  - citracal-vitamin D 2 tab daily    #DVT ppx  - ASA 325    #Dispo  EDD 1/28     Patient Active Problem List   Diagnosis    Fall, initial encounter    Seizure after head injury    Debility    Sepsis       Continue comprehensive and intensive inpatient rehab program, including:   Physical therapy 60-120 min daily, 5-6 times per week, Occupational therapy 60-120 min daily, 5-6 times per week, speech therapy 60-120 min daily, 5-6 times per week, Case management and Rehabilitation nursing    Signed by: Charlestine Massed, MD MD    [facility]    If there are questions or concerns about the content of this note or information contained within the body of this dictation they should be addressed directly with the author for clarification.

## 2020-04-27 LAB — GLUCOSE WHOLE BLOOD - POCT
Whole Blood Glucose POCT: 149 mg/dL — ABNORMAL HIGH (ref 70–100)
Whole Blood Glucose POCT: 200 mg/dL — ABNORMAL HIGH (ref 70–100)
Whole Blood Glucose POCT: 188 mg/dL — ABNORMAL HIGH (ref 70–100)
Whole Blood Glucose POCT: 197 mg/dL — ABNORMAL HIGH (ref 70–100)

## 2020-04-27 MED ORDER — LEVETIRACETAM 500 MG PO TABS
500.0000 mg | ORAL_TABLET | Freq: Two times a day (BID) | ORAL | 0 refills | Status: AC
Start: 2020-04-27 — End: ?

## 2020-04-27 MED ORDER — SERTRALINE HCL 50 MG PO TABS
50.0000 mg | ORAL_TABLET | Freq: Every day | ORAL | 0 refills | Status: DC
Start: 2020-04-27 — End: 2020-04-27

## 2020-04-27 MED ORDER — LEVOTHYROXINE SODIUM 75 MCG PO TABS
75.0000 ug | ORAL_TABLET | Freq: Every day | ORAL | 0 refills | Status: DC
Start: 2020-04-28 — End: 2021-02-15

## 2020-04-27 MED ORDER — ASPIRIN 81 MG PO CHEW
81.0000 mg | CHEWABLE_TABLET | Freq: Every day | ORAL | 0 refills | Status: DC
Start: 2020-04-27 — End: 2021-02-15

## 2020-04-27 MED ORDER — SERTRALINE HCL 50 MG PO TABS
50.0000 mg | ORAL_TABLET | Freq: Every day | ORAL | 0 refills | Status: AC
Start: 2020-04-27 — End: ?

## 2020-04-27 MED ORDER — ONDANSETRON 4 MG PO TBDP
4.0000 mg | ORAL_TABLET | Freq: Three times a day (TID) | ORAL | 0 refills | Status: AC | PRN
Start: 2020-04-27 — End: ?

## 2020-04-27 MED ORDER — ATORVASTATIN CALCIUM 40 MG PO TABS
40.0000 mg | ORAL_TABLET | Freq: Every evening | ORAL | 0 refills | Status: AC
Start: 2020-04-27 — End: ?

## 2020-04-27 MED ORDER — LEVOTHYROXINE SODIUM 75 MCG PO TABS
75.0000 ug | ORAL_TABLET | Freq: Every day | ORAL | 0 refills | Status: DC
Start: 2020-04-28 — End: 2020-04-27

## 2020-04-27 MED ORDER — ASPIRIN 81 MG PO CHEW
81.0000 mg | CHEWABLE_TABLET | Freq: Every day | ORAL | 0 refills | Status: DC
Start: 2020-04-27 — End: 2020-04-27

## 2020-04-27 MED ORDER — ONDANSETRON 4 MG PO TBDP
4.0000 mg | ORAL_TABLET | Freq: Three times a day (TID) | ORAL | 0 refills | Status: DC | PRN
Start: 2020-04-27 — End: 2020-04-27

## 2020-04-27 MED ORDER — ATORVASTATIN CALCIUM 40 MG PO TABS
40.0000 mg | ORAL_TABLET | Freq: Every evening | ORAL | 0 refills | Status: DC
Start: 2020-04-27 — End: 2020-04-27

## 2020-04-27 MED ORDER — AMLODIPINE BESYLATE 10 MG PO TABS
10.0000 mg | ORAL_TABLET | Freq: Every day | ORAL | 0 refills | Status: DC
Start: 2020-04-28 — End: 2020-04-27

## 2020-04-27 MED ORDER — AMLODIPINE BESYLATE 10 MG PO TABS
10.0000 mg | ORAL_TABLET | Freq: Every day | ORAL | 0 refills | Status: AC
Start: 2020-04-28 — End: ?

## 2020-04-27 MED ORDER — ACETAMINOPHEN 325 MG PO TABS
650.0000 mg | ORAL_TABLET | Freq: Four times a day (QID) | ORAL | Status: DC | PRN
Start: 2020-04-27 — End: 2020-04-27

## 2020-04-27 MED ORDER — LEVETIRACETAM 500 MG PO TABS
500.0000 mg | ORAL_TABLET | Freq: Two times a day (BID) | ORAL | 0 refills | Status: DC
Start: 2020-04-27 — End: 2020-04-27

## 2020-04-27 MED ORDER — ACETAMINOPHEN 325 MG PO TABS
650.0000 mg | ORAL_TABLET | Freq: Four times a day (QID) | ORAL | 0 refills | Status: DC | PRN
Start: 2020-04-27 — End: 2021-02-15

## 2020-04-27 NOTE — Progress Notes (Signed)
PHYSICAL MEDICINE AND REHABILITATION  PROGRESS NOTE -- FACE-TO-FACE ENCOUNTER    Date Time: 04/27/20 1:42 PM  Patient Name: Yolanda Cox, Yolanda Cox    Admission date:  04/11/2020    Subjective:     No acute events overnight. Patient feels well today. Says she is looking forward to leaving tomorrow. Discussed discharge medications. No current complaints.      Functional Status:     PT:  Pt demonstrates a 71% increase in self-selected gait speed today compared to  yesterday (0.21 m/s to 0.36 m/s), indicative of household mobility, but limited  community ambulation at this time. Pt continues to benefit from SUPV due to L  visual inattention and intermittent shuffling gait pattern with forward flexed  trunk. Pt is able to correct, however, demonstrate decreased insight to deficits  resulting in limited capacity for self correction. Daughter present and  participatory in PT treatment session to include functional transfer and gait  training. Daughter verbalized and demonstrated safe pt handling skills during  session and demonstrated capacity to assist pt back home to senior living  facility with hired CG assistance. Plan for additional family training tomorrow  (04/26/2020) for car transfer training and community surface gait training in  preparation for discharge this week.    OT:  Pt's daughter demonstrated good understanding of the need for 24/7 supervision.  She responded well to feedback and confirmed pt has all recommended DME at pt's  apartment. Pt will benefit from ADL review to assess progress and make further  recommendations in preparation for her upcoming [place].      SLP:  Summary of Progress and Current Status: Pt is a 87 yo F who was admitted to  [facility] s/p fall and seizure. Initial ST evaluation revealed moderate cognitive  deficits in the areas of processing, L-attention, and memory. During the pt's  2-week stay in [place], she has made gradual progress towards ST goals. Rate and  degree of progress was impacted by  severity of memory deficits (suspect present  at baseline) and complicated medical course (was admitted to ED last week for  fever and vomiting). Mild-moderate deficits remain in cognition, specifically  relating to memory and problem solving/sequencing. Due to remaining impairments,  pt will require 24/7 supervision upon d/c home to maintain safety during  mobility and ADLs. Additionally, would benefit from ongoing ST in the home  setting.    Medications:   Medication reviewed by me:     Scheduled Meds: PRN Meds:   amLODIPine, 10 mg, Oral, Daily  aspirin, 325 mg, Oral, Daily  atorvastatin, 40 mg, Oral, QHS  calcium citrate-vitamin D, 2 tablet, Oral, Daily  folic acid, 1 mg, Oral, Daily  insulin lispro, 1-3 Units, Subcutaneous, QHS  insulin lispro, 1-5 Units, Subcutaneous, TID AC  lactobacillus/streptococcus, 1 capsule, Oral, Daily  levETIRAcetam, 500 mg, Oral, Q12H [facility]  levothyroxine, 75 mcg, Oral, Daily at 0600  multivitamin, 1 tablet, Oral, Daily  sertraline, 50 mg, Oral, Daily  vitamin D, 25 mcg, Oral, Daily        Continuous Infusions:   acetaminophen, 650 mg, Q6H PRN  albuterol-ipratropium, 3 mL, Q6H PRN  butalbital-acetaminophen-caffeine, 1 tablet, Q4H PRN  dextrose, 15 g of glucose, PRN   And  dextrose, 12.5 g, PRN   And  glucagon (rDNA), 1 mg, PRN  magnesium hydroxide, 30 mL, Daily PRN  melatonin, 3 mg, QHS PRN  ondansetron, 4 mg, Q8H PRN  senna-docusate, 2 tablet, QHS PRN  Medication Review  1. A complete drug regimen review was completed: Yes  2. Were any drug issues found during review?: No      Review of Systems:   A comprehensive review of systems was: No fevers, chills, nausea, vomiting, chest pain, shortness of breath, cough, headache, double vision.  All others negative.    Physical Exam:     Vitals:    04/26/20 0845 04/26/20 1652 04/27/20 0432 04/27/20 0814   BP: 143/68 118/68 138/73 127/70   Pulse:  (!) 57 62 (!) 57   Resp:  12 16 15    Temp:  98.6 F (37 C) 98.2 F (36.8 C) 97.5 F (36.4  C)   TempSrc:  Oral Oral Oral   SpO2:  95% 99% 98%   Weight:       Height:           Intake and Output Summary (Last 24 hours) at Date Time    Intake/Output Summary (Last 24 hours) at 04/27/2020 1342  Last data filed at 04/27/2020 0810  Gross per 24 hour   Intake 240 ml   Output 0 ml   Net 240 ml     P.O.: 120 mL (04/27/20 0810)     Urine: 0 mL (04/27/20 0810)           General: WN/WD, in NAD, seen lying comfortably in bed  HEENT: NC/AT, moist mucosal membranes, anicteric sclera  Cardiac: regular rate, extremities warm and well perfused  Resp: non-labored breathing, acyanotic  Abdomen: soft, non-tender, non-distended  Neuro: Awake and alert, oriented to person and place, knows the month and year but not date, speech fluent and spontaneous without dysarthria but is slowed, mild proximal weakness but moves extremities with AG strength throughout    Labs:   No results for input(s): GLUCOSEWHOLE in the last 24 hours.    Recent Labs   Lab 04/26/20  0510 04/23/20  0357 04/22/20  0350 04/21/20  1528   WBC 10.37* 9.48 18.81* 27.12*   Hgb 9.9* 9.0* 8.8* 10.9*   Hematocrit 30.2* 28.3* 28.0* 33.6*   Platelets 235 198 189 233        Recent Labs   Lab 04/26/20  0510 04/23/20  0357 04/22/20  0350 04/21/20  1528   Sodium 142 139 140 138   Potassium 3.4* 3.8 3.5 3.5   Chloride 108 109 110 104   CO2 24 22 22  20*   BUN 15.0 15.0 12.0 17.0   Creatinine 0.7 0.7 0.7 0.8   Calcium 9.4 8.8 8.6 9.8   Albumin  --   --   --  4.2   Protein, Total  --   --   --  7.7   Bilirubin, Total  --   --   --  0.9   Alkaline Phosphatase  --   --   --  72   ALT  --   --   --  15   AST (SGOT)  --   --   --  23   Glucose 137* 165* 128* 172*             Results     Procedure Component Value Units Date/Time    Glucose Whole Blood - POCT [[id number]]  (Abnormal) Collected: 04/27/20 1143     Updated: 04/27/20 1151     Whole Blood Glucose POCT 200 mg/dL     Glucose Whole Blood - POCT [[id number]]  (Abnormal) Collected: 04/27/20 0750     Updated: 04/27/20  1610      Whole Blood Glucose POCT 149 mg/dL     Glucose Whole Blood - POCT [[id number]]  (Abnormal) Collected: 04/26/20 2057     Updated: 04/26/20 2109     Whole Blood Glucose POCT 189 mg/dL     Glucose Whole Blood - POCT [[id number]]  (Abnormal) Collected: 04/26/20 1651     Updated: 04/26/20 1656     Whole Blood Glucose POCT 115 mg/dL                Rads:   Radiological Procedure reviewed.  Radiology Results (24 Hour)     ** No results found for the last 24 hours. **              Assessment and Plan:     87yo F with prior traumatic brain bleed, NPH, HTN, DM, HLD, hypothyroidism, RA, and osteoporosis presented after syncopal event and was found to have aspiration pneumonia thought to be secondary to seizures    #Rehab  - continue PT/OT/SLP  - 1/13 patient was discussed this morning in team conference, see medilinks notes for details  - 1/20 she was discussed this morning in team conference, see medilinks notes for details  - 1/24 patient returned from the ED yesterday after being diagnosed with viral gastroenteritis; appears she has had a bit of a functional decline and will likely try to extend patient's stay an additional 2-3 days to address this  - 1/27 patient was discussed this morning in team conference, see medilinks notes for details    #New onset seizures  - patient with syncopal episode in assisted living facility and then seizure activity while in ED  - spot EEG negative  - CT head and MRI brain negative for acute intracranial process  - seen by neurology, Dr. Marney Doctor while in acute hospital  - seizure precautions  - continue with keppra 500mg  BID  - will give one month supply of keppra in discharge and she will follow up with neurology    #Hx of traumatic brain bleed/NPH  - CT and MRI showing chronic changes only; without evidence of hydrocephalus  - does have some cognitive impairments that may be secondary to this prior injury  - SLP will be working with patient on cognition    #Viral Gastroenteritis  - patient  was sent to the ED on 1/21 and returned 1/23; underwent extensive sepsis workup and was diagnosed with viral gastroenteritis. Conservative management    #Aspiration pneumonia  - thought to be secondary to seizure activity  - was on zosyn while in acute hospital and transitioned to augmentin to complete 5 day course on admission to rehab, stop date 1/16  - probiotic, incentive spirometer, duonebs PRN  - will need repeat CXR in 4-6 weeks for resolution  - 1/17 patient has completed abx, no current signs/symptoms of infection    #HTN  - amlodipine 10mg   - 1/27 BP at goal    #DM  - HbA1c 7.6  - per daughter the patient's blood sugar elevates when she is stressed and then it returns to normal, decision was made to hold off on starting an oral agent  - continue SSI  - 1/27 blood sugar remains elevated but daughter prefers to not have patient on standing medications    #HLD  - atorvastatin 40mg     #Hypothyroidism  - levothyroxine    #Anemia  - likely secondary to chronic disease  - Hb 10.5 on admission, continue to monitor with  intermittent cbc  - 1/20 Hb 9.6  - 1/23 Hb 9.0  - 1/26 Hb 9.9    #Hypokalemia  - 1/26 K 3.4, gave KCl    #T7 compression fracture  - age indeterminate with no acute findings on exam, continue to monitor    #Depression  - zoloft 50mg     #Rheumatoid arthritis  - on methotrexate qweekly, can restart after d/c from rehab    #Osteoporosis  - citracal-vitamin D 2 tab daily    #DVT ppx  - ASA 325; will change back to 81mg  on discharge    #Dispo  EDD 1/28     Patient Active Problem List   Diagnosis    Fall, initial encounter    Seizure after head injury    Debility    Sepsis       Continue comprehensive and intensive inpatient rehab program, including:   Physical therapy 60-120 min daily, 5-6 times per week, Occupational therapy 60-120 min daily, 5-6 times per week, speech therapy 60-120 min daily, 5-6 times per week, Case management and Rehabilitation nursing    Signed by: Charlestine Massed, MD MD    [facility]    If there are questions or concerns about the content of this note or information contained within the body of this dictation they should be addressed directly with the author for clarification.

## 2020-04-27 NOTE — Rehab Progress Note (Medilinks) (Signed)
NAMEFERRIN Cox  MRN: [id number]  Account: [id number]  Session Start: 04/27/2020 1:00:00 PM  Session Stop: 04/27/2020 2:00:00 PM    Total Treatment Minutes:  Minutes    Therapeutic Recreation  Inpatient Rehabilitation Progress Note    Rehab Diagnosis: Closed head injury after a fall, Mild anterior wedge  compression fracture of T7  Demographics:            Age: 23Y            Gender: Female  Rehabilitation Precautions/Restrictions:   Fall precaution, skin precaution, seizure precaution, HOH, impaired vision,  delayed mental processing    SUBJECTIVE  Patient Reports: "Why is it doing that?":  Pain: Patient currently without complaints of pain.    OBJECTIVE    Interventions: The following treatment was provided:  Avocational Activity-Cognitive:  Therapist and pt reviewed ipad leisure skills  from previous sessions. Pt with difficulty remembering purpose and navigation of  ads hindered performance.  Pain Reassessment: Pain was not reassessed as no pain was reported.    Education Provided:  No education provided this session.    ASSESSMENT  Pt demo'ing slightly decreased performance in use of ipad, youtube navigation,  and game of solitaire. Due to poor eyesight, technology literacy, and memory pt  unlikely to reach leisure independence with ipad.    PLAN  Therapeutic Recreation services are not recommended at this time due to: No need  for skilled therapy intervention at this time. Pt has met all TR goals and is  preparing for d/c.    Signed by: Sindy Messing, TR 04/27/2020 2:00:00 PM

## 2020-04-27 NOTE — Progress Notes (Incomplete)
MEDICINE PROGRESS NOTE  Easton Sivertson R. Geoffery Spruce MD    Date Time: 04/27/20 9:24 AM  Patient Name: Yolanda Cox  Attending Physician: Arizona Constable, MD, MD    CC: ***    Interval History/24 hour events: ***    Yolanda Cox a 87 y.o.femalehistory of posttraumatic hemorrhage 1 year ago, anemia, HLD, hypothyroidism, osteoporosis, rheumatoid arthritis and TIA. She lives at [facility] community in [place]. The day of admission on 04/09/2020 she felt nauseated and she tried to walk to the dining area to seek help but passed that before she got there. She was taken to [facility] emergency department and while there she had a tonic-clonic seizure.A CT scan of the head showed sequelae of microvascular disease but no acute abnormalities. CT scan of the spine showed no acute lesions. She was started on Keppra,and was treated with Zosyn for possible aspiration pneumonia. She was discharged to acute rehab center on 04/12/2019   Review of Systems:   Review of Systems - {ros master:[id number]}      Physical Exam:   Patient Vitals for the past 24 hrs:   BP Temp Temp src Pulse Resp SpO2   04/27/20 0814 127/70 97.5 F (36.4 C) Oral (!) 57 15 98 %   04/27/20 0432 138/73 98.2 F (36.8 C) Oral 62 16 99 %   04/26/20 1652 118/68 98.6 F (37 C) Oral (!) 57 12 95 %       Intake and Output Summary (Last 24 hours) at Date Time    Intake/Output Summary (Last 24 hours) at 04/27/2020 0924  Last data filed at 04/26/2020 2200  Gross per 24 hour   Intake 120 ml   Output 0 ml   Net 120 ml       General: awake, alert, oriented x 3; no acute distress.  Cardiovascular: regular rate and rhythm, no murmurs, rubs or gallops  Lungs: clear to auscultation bilaterally, without wheezing, rhonchi, or rales  Abdomen: soft, non-tender, non-distended; no palpable masses, no hepatosplenomegaly, normoactive bowel sounds, no rebound or guarding  Extremities: no clubbing, cyanosis, or edema  Other: ***    Meds:      Medications were reviewed:    Labs:     Results     Procedure Component Value Units Date/Time    Glucose Whole Blood - POCT [[id number]]  (Abnormal) Collected: 04/27/20 0750     Updated: 04/27/20 0812     Whole Blood Glucose POCT 149 mg/dL     Glucose Whole Blood - POCT [[id number]]  (Abnormal) Collected: 04/26/20 2057     Updated: 04/26/20 2109     Whole Blood Glucose POCT 189 mg/dL     Glucose Whole Blood - POCT [[id number]]  (Abnormal) Collected: 04/26/20 1651     Updated: 04/26/20 1656     Whole Blood Glucose POCT 115 mg/dL     Glucose Whole Blood - POCT [[id number]]  (Abnormal) Collected: 04/26/20 1146     Updated: 04/26/20 1149     Whole Blood Glucose POCT 119 mg/dL             Imaging personally reviewed, including: ***      Assessment:     Patient Active Problem List   Diagnosis    Fall, initial encounter    Seizure after head injury    Debility    Sepsis       ***    Plan:   ***      Disposition:   Today's date:  04/27/2020  Anticipated medical stability for discharge:  Service status:  Reason for ongoing hospitalization: ***  Anticipated discharge needs: ***      Signed by: Arizona Constable, MD, MD

## 2020-04-27 NOTE — Progress Notes (Signed)
Chaplain attempted follow-up with patient.  She was asleep.    Rev. Jackquline Berlin, M.Div., [facility]  Lead Chaplain for [facility] and [employer]  [id number]  [phone]  [name]  In emergencies page [phone]    Chaplain Service      Assessment:  Spiritual Assessment: Coping with illness/hospitalization    Background:  Visit Type: Follow-up was made by Chaplain with patient, Yolanda Cox, based on Source: Chaplain Initiated.  Present at Visit: only the patient was present.  Spiritual Care Provided to: patient only.  Length of Visit: 0-15 minutes .    Summary:  Reason for Request: Spiritual Assessment,Emotional Support,Spiritual Support   Spiritual Care Interventions: Provided comfort, encouragement, affirmation,Provided reflective and compassionate listening,Provided prayer,Made a positive connection with patient,Provided Chaplaincy education   Spiritual Care Outcomes: Unavailable

## 2020-04-27 NOTE — Rehab Progress Note (Medilinks) (Signed)
Yolanda Cox  MRN: [id number]  Account: [id number]  Session Start: 04/26/2020 3:00:00 PM  Session Stop: 04/26/2020 4:00:00 PM    Total Treatment Minutes: 60.00 Minutes    Physical Therapy  Inpatient Rehabilitation Progress Note    Rehab Diagnosis: Closed head injury after a fall, Mild anterior wedge  compression fracture of T7  Demographics:            Age: 80Y            Gender: Female  Rehabilitation Precautions/Restrictions:   Fall precaution, skin precaution, seizure precaution, HOH, impaired vision,  delayed mental processing    SUBJECTIVE  Patient Report: Pt received sitting upright in WC, chair alarm activated, via  direct handoff with OT (Nis), daughter present. Pt and daughter agreeable to  family training.  Patient/Caregiver Goals:  "to get better"  Pain: Patient currently without complaints of pain.    OBJECTIVE  Interventions:       Therapeutic Activities:  PT treatment session focus on family training w/  pt's adult daughter [name] present. Bed mobility: flat bed, rolling L<>R w/ SUPV,  min VCing for scooting to HOB. Transfer training: 4x intermittent sit<>stands  using RW w/ SUPV and min VCing to check RW brakes prior to initiating transfer.  Car transfer w/ SUPV using elevated car simulator and VCing to slow sequencing  to increase safety awareness.  Gait training: >200 ft w/ RW and SUPV, w/ /R/10  ft on uneven surface to simulate ambulating community distances and varying  terrain w/in a distracting environment. Fall prevention and recovery training:  SPT Demonstrated 2 fall recovery techniques for safe return to sitting. SPT  provided verbal and written instruction w/ handout of HEP. Daughter engaged and  fully participatory throughout session.  Patient was returned to or left in bed.   w/ daughter, [name], present  Handoff to nurse completed.  Bed alarm in place and activated.  Oriented to call bell and placed within reach.  Personal items within reach.  Assistive devices positioned out of  reach.  Bed placed in lowest position.    Pain Reassessment: Pain was not reassessed as no pain was reported.    Lower Extremity Orthosis: Patient does not present with foot drop or ankle  instability and does not need an orthotic.  Equipment Provided/Ordered/Recommended: The patient has been given voluntary  selection of a medical equipment provider, as per federal law. Arrangements have  been made with a company of the patient?s choosing. The patient has been  educated that choosing a company outside of [employer] company?s preferred  provider may affect their insurance coverage.  The patient has been provided  with information on care of equipment as appropriate.   PT recommending consistent use of FWW or RW; daughter confirmed pt owns RW at  home prior to hospital admission.    Education Provided:    Education Provided: Plan of care. Rehab techniques and procedures. Pt and  daughter oriented to PT family training session and rationale for interventions.  Safety issues and interventions. Bed mobility. Functional transfers. Fall  prevention/balance training. Car transfers. Equipment. Gait. Home  exercise/activity plan. Stair/curb/environmental barrier negotiation.       Audience: Patient and Caregiver       Mode: Explanation.  Demonstration.  Teacher, English as a foreign language provided.       Response: Applied knowledge.  Verbalized understanding.  Demonstrated skill.  Needs reinforcement.    ASSESSMENT  Pt's daughter confirmed pt has recommended DME and demonstrated good  understanding of  the need for SUPV for all pt mobility. Pt benefits from VCing  for assessing brakes on RW prior to initiating curb negotiation, however,  correctly demonstrates proper sequencing during transfer. Pt able to ambulate  community distances w/ RW, however, presents w/ shuffling gait pattern once  fatigued, and benefits from VCing for initiating initial contact w/ heel for  efficient gait mechanics.   PT recommending written instructions for  brake  reminder to be attached to RW for increased safety awareness.    CARE Tool  Mobility Functional Assessment  Roll Left and Right: 04 - Supervision: Helper provides supervision for safety or  verbal cues ONLY  Assistive Device(s):   None    Sit to Lying: 04 - Supervision: Helper provides supervision for safety or verbal  cues ONLY  Assistive Device(s):   None    Lying to Sitting on Side of Bed: 04 - Supervision: Helper provides supervision  for safety or verbal cues ONLY  Assistive Device(s):   None    Sit to Stand: 04 - Supervision: Helper provides supervision for safety or verbal  cues ONLY  Assistive Device(s):   Rolling walker   W/ SUPV only    Chair/Bed-to-Chair Transfer: 04 - Supervision: Helper provides supervision for  safety or verbal cues ONLY  Assistive Device(s):   Rolling walker   W/ SUPV only    Car Transfer: 04 - Supervision: Helper provides supervision for safety or verbal  cues ONLY   Using RW, pt benefits from VC's from daughter to slow sequencing for increased  safety and environmental awareness.    Walk 10 Feet: 04 - Supervision: Helper provides supervision for safety or verbal  cues ONLY   Using RW, pt maintains COM near RW for short household distances    Walk 50 Feet with Two Turns: 04 - Supervision: Helper provides supervision for  safety or verbal cues ONLY   Using RW, pt demonstrates decreased cadence and step length w/in distracting  environments and benefits from VCing for initiating steps w/ heel vs toe to  increase gait speed and step length.    Walk 150 Feet:  04 - Supervision: Helper provides supervision for safety or  verbal cues ONLY   Using RW, pt demonstrates decreased cadence and step length w/in distracting  environments and benefits from VCing for initiating steps w/ heel vs toe to  increase gait speed and step length.    Walking 10 Feet on Uneven Surface: 04 - Supervision: Helper provides supervision  for safety or verbal cues ONLY   Using RW and decreased step length for  increased safety while navigating  uneven terrain    1 Step (curb):  04 - Touching Assistance: Helper provides  touching/steadying/contact guard   W/ RW and VCing for engaging RW brakes prior to initiating transfer. Pt  demonstrates appropriate sequencing.    4 Steps:  09 - Not applicable.   Pt reports not using stairs prior to admission to rehab.    12 Steps: 09 - Not applicable.   Pt reports not using stairs prior to admission.    Wheelchair: Patient does not use a wheelchair or scooter.    Long Term Goals: Pt will perform 8 sit<>stand transfer from bed <>WC using LRAD  and touch assist, without LOB or rest breaks to demonstrate increased  cardiovascular and BLE strength and endurance.  Pt will ambulate >250 feet w/ FWW and SUPV to   demonstrate an increase in endurance and functional independence to return to  PLOF  Pt will reach outside of BOS 10 consecutive times in standing w/ Min Assist to  demonstrate weight shift in all planes to demonstrate increased dynamic sitting  balance and increased safety.  Pt will ambulate >50 feet w/ FF, while navigating moving obstacles and touch  assist, to demonstrate increased focus, safety, and functional mobility in a  distracting environment.  Time frame to achieve short term goal(s): 2 weeks from IE on 04/12/20  Short Term Goals: Pt will perform sit<>stand transfer from bed <>WC using LRAD  and SUPV to increase safety and independence.  Pt will ambulate >150 feet w/ FWW and SUPV to  demonstrate an increase in endurance and functional independence.  Pt will reach outside BOS 5 consecutive times in sitting w/ touch assist to  demonstrate weight shift in all planes to increase safety and dynamic sitting  balance.  Pt will navigate 6" threshold using LRAD and Min Assist to safely simulate curb  negotiation for increased dynamic standing balance and reduced caregiver burden.  2 weeks from IE on 04/12/20    Progress Towards Goals: SHORT TERM GOAL REVIEW:       1. Pt will perform  sit<>stand transfer from bed <>WC using LRAD and SUPV to  increase safety and independence. - Met       2. Pt will ambulate >150 feet w/ FWW and SUPV to  demonstrate an increase in endurance and functional independence. - Met       3. Pt will reach outside BOS 5 consecutive times in sitting w/ touch assist  to demonstrate weight shift in all planes to increase safety and dynamic sitting  balance. - Met       4. Pt will navigate 6" threshold using LRAD and Min Assist to safely  simulate curb negotiation for increased dynamic standing balance and reduced  caregiver burden. - Met  Time frame to achieve short term goal(s):  2 weeks from IE on 04/12/20   LONG TERM GOAL REVIEW:       1. Pt will perform 8 sit<>stand transfer from bed <>WC using LRAD and touch  assist, without LOB or rest breaks to demonstrate increased cardiovascular and  BLE strength and endurance. - Met       2. Pt will ambulate >250 feet w/ FWW and SUPV to   demonstrate an increase in endurance and functional independence to return to  PLOF - Met       3. Pt will reach outside of BOS 10 consecutive times in standing w/ Min  Assist to demonstrate weight shift in all planes to demonstrate increased  dynamic sitting balance and increased safety. - Met       4. Pt will ambulate >50 feet w/ FF, while navigating moving obstacles and  touch assist, to demonstrate increased focus, safety, and functional mobility in  a distracting environment. - Met  Time frame to achieve long term goal(s):  Time frame to achieve LTG's: By D/C  date 04/28/20 or 04/29/20    PLAN  Continued Physical Therapy is recommended.  Recommended Frequency/Duration/Intensity: 60-120 minutes, 5-7 days a week for  10-14 days from IE on 04/12/2020.  D/C set for 04/25/2020. PT recommending additional 2-3 days of IP Rehab prior to  D/C 2/2 to recent admission to ER for gastric sepsis.  Activities Contributing Toward Care Plan: TherActivities, TherEx, Transfer  training, gait training, static and  dynamic balance training in sitting and  standing, cardiovascular endurance training, B LE strength training. Pt will  benefit from skilled care to improve safety, decrease fall risk, and reduce  caregiver burden, [place] planning as part of the interdisciplinary team.  Patient is appropriate for treatment in mobility group. Patient will benefit  from group therapy to increase aerobic endurance capacity for ambulating longer  distances, to improve safety with use of appropriate AD while dividing  attention, improve negotiation of environmental barriers in a distracting  environment to simulate interaction with others in the community, provide  interaction with peers with similar diagnoses in order to improve coping and  therapeutic participation, enhance ability to direct care with caregivers,  practice and carry over skills learned in individual sessions with new partners,  clinicians and contexts, practice applying strategies to manage communication or  cognitive challenges in a distracting environment    Care Plan  Identified problems from team documentation:  Problem: Impaired Cognition  Cognition: Primary Team Goal: Pt will answer orientation and biographical  questions with >90% accuracy given min cuing in order to improve memory./    Problem: Impaired Leisure Skills  Leisure Skills: Primary Team Goal: Pt and caregiver will describe opportunities  and resources for social engagement in the community./    Problem: Impaired Mobility  Mobility: Primary Team Goal: Pt will correctly demonstrate safe sit<>stand  transfers and ambulation using FWW under direct SUPV or touch assist via  clinical team./Active    Problem: Impaired Self-care Mgmt/ADL/IADL  Self Care: Primary Team Goal: Pt will complete toilet transfers and all  associated tasks with supervision using AD as indicated to ensure safety./    Problem: Safety Risk and Restraint  Safety: Primary Team Goal: Pt. will recognize limitations and call for  assistance  before attempting mobility 100% of the time in order to prevent fall  while in rehab/Active    Add/Update Problems from this Treatment:  Update of existing problem:   Mobility: Pt grossly SUPV for bed mobility, SUPV for transfers using FWW or RW,  SUPV for ambulation using FWW, and CGA-Min A for curb negotiation.    Discipline:  Physical Therapy    Please review Integrated Patient View Care Plan Flowsheet for Team identified  Problems, Interventions, and Goals.    3 Hour Rule Minutes: 60 minutes of PT treatment this session count towards  intensity and duration of therapy requirement. Patient was seen for the full  scheduled time of PT treatment this session.  Therapy Mode Minutes: Individual: 60 minutes.    Assessment and treatment was provided under he 1:1 direct supervision of Juluis Pitch, PT, DPT, NCS    Signed by: Everlene Other, SPT 04/26/2020 4:00:00 PM    Direct 1:1 supervision of Everlene Other, student of Physical Therapy, was  provided throughout this session. - CoSigned By: Juluis Pitch, PT 04/27/2020  8:41:51 AM

## 2020-04-27 NOTE — Rehab Discharge Instruction ([facility]ilinks) (Signed)
Yolanda Cox  MRN: [id number]  Account: [id number]  Session Start: 04/27/2020 12:00:00 AM  Session Stop: 04/27/2020 12:00:00 AM    Total Treatment Minutes:  Minutes    Case Management  Inpatient Rehabilitation Discharge Instructions    Discharge Date: Friday April 28, 2020.  Transportation: Family will transport the patient home via car.  Discharge Plan: Home with home health services RN, PT, and OT.                                                     Home Health Referral    Referral from [facility] (Case Manager) for home health care upon  discharge.    By [employer], the patient has the right to freely choose a home care provider.  Arrangements have been made with:     A company of the patients choosing. We have supplied the patient with a listing  of providers in your area who asked to be included and participate in Medicare.     The preferred provider of your insurance company. Choosing a home care provider  other than your insurance company?s preferred provider may affect your insurance  coverage.                                 Home Health Discharge Information    Your doctor has ordered Skilled Nursing, Physical Therapy, Occupational Therapy  and Speech and Language Therapy in-home service(s) for you while you recuperate  at home, to assist you in the transition from hospital to home.      The agency that you or your representative chose to provide the service:  Name of Home Health Agency Placement: Other (comment box) [facility]) [phone]      Home health services were set up by: Dustin Flock, RN  (Post Acute Care  Coordinator for home health)   Phone [phone]    IF YOU HAVE NOT HEARD FROM YOUR HOME YOUR HOME HEALTH AGENCY WITHIN 24-48 HOURS  AFTER DISCHARGE PLEASE CALL YOUR AGENCY TO ARRANGE A TIME FOR YOUR FIRST VISIT.  FOR ANY SCHEDULING CONCERNS OR QUESTIONS RELATED TO HOME HEALTH, SUCH AS TIME OR  DATE PLEASE CONTACT YOUR HOME HEALTH AGENCY AT THE NUMBER  LISTED ABOVE.                                  Recommended Follow-up Appointment(s):    Neurology Follow Up  Follow [place] MD  [address]  [place] [address]  [phone]      Primary Care Follow Up  Follow up with Dr. Loa Socks  [address] [place] [address]  [phone]    Renotification of Medicare Important Message: The Renotification of Medicare  Important Message letter was issued.    Community Resources: CM will provide the patient with follow up provider  information.    Additional Information: If you have questions about your rehab stay please  contact your Rehab Case manager [name] at [phone].    Signed by: Dallie Piles, MSW 04/27/2020 4:26:00 PM

## 2020-04-27 NOTE — Rehab Discharge Summary (Medilinks) (Signed)
[name]  MRN: [id number]  Account: [id number]  Session Start: 04/27/2020 12:00:00 AM  Session Stop: 04/27/2020 12:00:00 AM    Total Treatment Minutes:  Minutes    Case Management  Inpatient Rehabilitation Discharge Summary    Rehab Diagnosis: Closed head injury after a fall, Mild anterior wedge  compression fracture of T7  Demographics:            Age: 87Y            Gender: Female    Past Medical History: Anemia  Fibromyalgia  Hyperlipidemia  hypothyroid  Osteoporosis  Rheumatoid  TIA (transient ischemic attack)    PSH  CHOLECYSTECTOMY  History of Present Illness: "Yolanda Cox is a 87 y.o. female with a  medical history of anemia fibromyalgia, HLD, hypothyroid, osteoporosis,  rheumatoid, arthritis, TIA, and cerebral hemorrhage who was brought in by EMS to  [facility] status post fall and head injury at the [employer] retirement community.  Per EMR, patient was walking at the retirement community that she is a resident  of when she fell forward striking her forehead on the floor, patient had been  reportedly nauseated prior to the fall. Per EMR, patient had a change in her  mental status while in route to the hospital. At this time patient has a flat  affect and very delayed responses, patient is alert to self and place. Patient  reported a headache and points to the back of her head and reports of a  backache. While in the ER she had a episode of tonic-clonic seizure as described  by her daughter.  Patient has history of traumatic brain bleed in [place]  about a year ago.  She also has history of normal pressure hydrocephalus. She  lives alone in [place] independent living in a 1 level home with no steps to  enter or inside. Prior to her recent decline she was independent for all  mobility and ADLs. Currently she has min a to mod a for bed mobility and  transfers, She waslked 20 feet mod a with a rw. She is min a for grooming. She  has mild-moderately decreased cognitive/linguitci skills  characterized by  decreased attention/concentration, short term memory, and executive functioning.  The patient has been working well with therapy services to include PT, OT, and  SLP to address functional mobility, self-care deficit, cognition, and  swallowing.  The patient would benefit from an intensive level of rehab along  with close medical management of multiple medical conditions and rehab MD to  drive the rehab process.  The patient is medically stable for IRF level of  care."   Date of Onset:  04/09/2020   Date of Admission: 04/11/2020 5:40:00 PM    Discharge Date: Friday April 28, 2020.  Transportation: Family will transport the patient home via car.  Discharge Plan: Home with home health services RN, PT, and OT.                                                     Home Health Referral    Referral from [facility] (Case Manager) for home health care upon  discharge.    By [employer], the patient has the right to freely choose a home care provider.  Arrangements have been made with:  A company of the patients choosing. We have supplied the patient with a listing  of providers in your area who asked to be included and participate in Medicare.     The preferred provider of your insurance company. Choosing a home care provider  other than your insurance company?s preferred provider may affect your insurance  coverage.                                 Home Health Discharge Information    Your doctor has ordered Skilled Nursing, Physical Therapy, Occupational Therapy  and Speech and Language Therapy in-home service(s) for you while you recuperate  at home, to assist you in the transition from hospital to home.      The agency that you or your representative chose to provide the service:  Name of Home Health Agency Placement: Other (comment box) [facility]) [phone]      Home health services were set up by: Dustin Flock, RN  (Post Acute Care  Coordinator for home health)   Phone  [phone]    IF YOU HAVE NOT HEARD FROM YOUR HOME YOUR HOME HEALTH AGENCY WITHIN 24-48 HOURS  AFTER DISCHARGE PLEASE CALL YOUR AGENCY TO ARRANGE A TIME FOR YOUR FIRST VISIT.  FOR ANY SCHEDULING CONCERNS OR QUESTIONS RELATED TO HOME HEALTH, SUCH AS TIME OR  DATE PLEASE CONTACT YOUR HOME HEALTH AGENCY AT THE NUMBER LISTED ABOVE.  Community Resources: CM will provide the patient with follow up provider  information.    Follow Up Appointment(s):  Neurology Follow Up  [place]  [address]  [place] [address]  [phone]      Primary Care Follow Up  Follow up with Dr. Loa Socks  [address] [place] [address]  [phone]    Education Provided:    Education Provided: Plan of care.       Audience: Patient.       Mode: Explanation.       Response: Verbalized understanding.    Renotification of Medicare Important Message: The Renotification of Medicare  Important Message letter was issued.    Care Plan  Identified problems from team documentation:  Problem: Impaired Cognition  Cognition: Primary Team Goal: Pt will answer orientation and biographical  questions with >90% accuracy given min cuing in order to improve memory./Met    Problem: Impaired Leisure Skills  Leisure Skills: Primary Team Goal: Pt and caregiver will describe opportunities  and resources for social engagement in the community./    Problem: Impaired Mobility  Mobility: Primary Team Goal: Pt will correctly demonstrate safe sit<>stand  transfers and ambulation using FWW under direct SUPV or touch assist via  clinical team./Active    Problem: Impaired Pain Management  Pain Mgmt: Primary Team Goal: Maintain comfort level /Active    Problem: Impaired Self-care Mgmt/ADL/IADL  Self Care: Primary Team Goal: Pt will complete toilet transfers and all  associated tasks with supervision using AD as indicated to ensure safety./Met    Problem: Safety Risk and Restraint  Safety: Primary Team Goal: Pt. will recognize  limitations and call for  assistance before attempting mobility 100% of the time in order to prevent fall  while in rehab/Active    Status update for discharge:      Please review Integrated Patient View Care Plan Flowsheet for Team identified  Problems, Interventions, and Goals.  Signed by: Dallie Piles, MSW 04/27/2020 4:27:00 PM

## 2020-04-27 NOTE — Rehab Discharge Summary ([facility]ilinks) (Signed)
NAMEKATIEANN HUNGATE  MRN: [id number]  Account: [id number]  Session Start: 04/27/2020 9:00:00 AM  Session Stop: 04/27/2020 10:00:00 AM    Total Treatment Minutes: 60.00 Minutes    Occupational Therapy  Inpatient Rehabilitation Discharge Summary and Note    Rehab Diagnosis: Closed head injury after a fall, Mild anterior wedge  compression fracture of T7  Demographics:            Age: 87Y            Gender: Female    Past Medical History: Anemia  Fibromyalgia  Hyperlipidemia  hypothyroid  Osteoporosis  Rheumatoid  TIA (transient ischemic attack)    PSH  CHOLECYSTECTOMY  History of Present Illness: "Jonai Weyland is a 87 y.o. female with a  medical history of anemia fibromyalgia, HLD, hypothyroid, osteoporosis,  rheumatoid, arthritis, TIA, and cerebral hemorrhage who was brought in by EMS to  [facility] status post fall and head injury at the [employer] retirement community.  Per EMR, patient was walking at the retirement community that she is a resident  of when she fell forward striking her forehead on the floor, patient had been  reportedly nauseated prior to the fall. Per EMR, patient had a change in her  mental status while in route to the hospital. At this time patient has a flat  affect and very delayed responses, patient is alert to self and place. Patient  reported a headache and points to the back of her head and reports of a  backache. While in the ER she had a episode of tonic-clonic seizure as described  by her daughter.  Patient has history of traumatic brain bleed in [place]  about a year ago.  She also has history of normal pressure hydrocephalus. She  lives alone in [place] independent living in a 1 level home with no steps to  enter or inside. Prior to her recent decline she was independent for all  mobility and ADLs. Currently she has min a to mod a for bed mobility and  transfers, She waslked 20 feet mod a with a rw. She is min a for grooming. She  has mild-moderately decreased  cognitive/linguitci skills characterized by  decreased attention/concentration, short term memory, and executive functioning.  The patient has been working well with therapy services to include PT, OT, and  SLP to address functional mobility, self-care deficit, cognition, and  swallowing.  The patient would benefit from an intensive level of rehab along  with close medical management of multiple medical conditions and rehab MD to  drive the rehab process.  The patient is medically stable for IRF level of  care."   Date of Onset: 04/09/2020   Date of Admission: 04/11/2020 5:40:00 PM  Rehabilitation Precautions/Restrictions:   Fall precaution, skin precaution, seizure precaution, HOH, impaired vision,  delayed mental processing    SUBJECTIVE  Patient Report: "I slept very hard"  Pain: Patient currently without complaints of pain.    OBJECTIVE  General Observation: Pt was observed lying down in bed, appeared alert, dressed  with her night clothes, agreeable to participate.  Vitals:                       Before Activity              After Activity  Vitals  Position/Activity    upright in her chair         -  BP Systolic  135                          -  BP Diastolic         55                           -  Pulse                68                           -  O2 Saturation        97%                          -    Cognition: decreased STM, attention, alertness, problem solving, and safety  Perception: No overt perceptual deficits were noted during OT sessions    CARE Tool Discharge Performance  Self-Care Functional Assessment  Eating: 06 - Independent without an assistive device    Oral Hygiene: 06 - Independent with an assistive device  Assistive Device(s):   None  Context for oral hygiene:   Standing    Toileting Hygiene: 04 - Supervision: Helper provides supervision for safety or  verbal cues ONLY  Toileting Equipment:   Toilet  Assistive Device(s):   Adaptive device to maintain balance, Grab bar    Shower/Bathe  Self: 04 - Supervision: Helper provides supervision for safety or  verbal cues ONLY  Location:  Shower.  Assistive Device(s):   Grab bar/arm rest to maintain balance, Hand held shower,  shower seat    Upper Body Dressing:   04 - Supervision: Helper provides supervision for safety or verbal cues ONLY  Assistive Device(s):   Assistive device for item retrieval    Lower Body Dressing: 04 - Supervision: Helper provides supervision for safety or  verbal cues ONLY  Assistive Device(s):   Assistive device for item retrieval/balance    Putting On/Taking Off Footwear: 05 - Setup or Clean Up Assistance: Helper sets  up or cleans up prior to or following an activity  Assistive Device(s):   None    Mobility Functional Assessment  Toilet Transfer: 04 - Supervision: Helper provides supervision for safety or  verbal cues ONLY  Assistive Device(s):   Grab bars, Rolling walker    Picking Up Objects: 04 - Touching Assistance: Helper provides  touching/steadying/contact guard  Assistive Device(s:)   Assistive device to maintain balance    Today's Treatment Interventions:       Self Care/Home Management:  Pt participated in BADL retraining including  dressing, grooming, and toileting tasks, using a FWW when performed short  functional ambulation in her room. Please see care tool section of this note for  more information on pt?s performance in session today. Pt required SPV for all  standing and other mobility tasks mainly for safety. Pt was able to stand for  approx. 10 seconds at sink side during grooming task before requested to sit and  rest. Vitals were monitored throughout the session and bed side nurse was made  aware. Reviewed DME recommendations, and advised pt to obtain a chair to be  placed in her bathroom and rest when feeling tired. Pt reported no concerns of  returning home tomorrow as scheduled, understanding continuum of care. She  reported her daughter-Jill hired a caregiver to be with her M-F  12-5 (confirmed  with  daughter) to provide support and supervision as recommended.  Patient was left seated in chair in his/her room. Handoff to nurse completed.  Chair alarm in place and activated.  Oriented to call bell and placed within reach.  Personal items within reach.  Assistive devices positioned out of reach.    Home Exercise Program: The patient was provided with an individualized home  exercise program.  Equipment Provided/Ordered/Recommended: The patient has been given voluntary  selection of a medical equipment provider, as per federal law. Arrangements have  been made with a company of the patient?s choosing. The patient has been  educated that choosing a company outside of [employer] company?s preferred  provider may affect their insurance coverage.The patient has been provided with  information on care of equipment as appropriate.   OT recommendations are for a shower chair, grab bar, hand held shower, grab  bars around the toilet    Education Provided:    Education Provided: Activities of daily living. Functional transfers. Safety.  Safety issues and interventions. Supervision requirements.       Audience: Patient.       Mode: Explanation.  Demonstration.       Response: Applied knowledge.  Verbalized understanding.  Demonstrated skill.  Needs practice.    ASSESSMENT  Summary of Progress and Current Status: Miss Osier Adaleigh is an 87 year old  female with above-mentioned past medical and surgical history admitted to [facility] IR  on 04/11/2020 s/p a fall resulted in head injury. Pt presented to OT eval with  decreased standing and seated balance lateral lean, with significant fear of  falling, decreased strength and endurance, decreased motor planning, cognitive  deficits, HOH, and visual deficits all negatively affecting her ability to  complete her self care tasks safely and independently. Therefore, she required  Min A for many of her self care and mobility tasks. Throughout her stay, she  received extensive training  in all mobility related ADLs, and ADL retraining.  Her daughter participated in formal family training session that included a  thorough review of techniques to maintain safety and prevent falls while at  home. At this time, pt is safe to return home with 24/7 supervision provided by  a care giver that will be with her M-F 12-5 and will be supplemented in the  begining by a family member. Additionally, she will benefit from [facility], and a f/u  appointment with ophthalmologist to monitor visual deficits.    Long Term Goals (status prior to discharge): Pt will complete LB dress with SPV  using AD as indicated  Pt will complete oral care/grooming tasks standing at sink side for approx 7  minutes with SPV using AD as indicated.  Pt will complete toilet transfer with SPV using appropriate DME  Pt will complete toilet hygiene with SPV using appropriate DME as indicated.  Pt's family/caregiver will attend family training and demo ability to safely and  independently provide recommended level of assist needed for ADLs and functional  mobility at discharge  In 10-14 days  Short Term Goals (status prior to discharge): Pt will complete LB dress with CGA  using AD as indiciated  Pt will complete oral care/grooming tasks standing at sink side for approx 3  minutes with CGA using AD as indicated.  Pt will complete toilet transfer with CGA using appropriate DME  Pt will complete toilet hygiene with CGA using appropriate DME as indicated.  in 6-8 days    Progress Towards Goals (final  status): SHORT TERM GOAL REVIEW:       1. Pt will complete LB dress with CGA using AD as indicated - Met        2. Pt will complete oral care/grooming tasks standing at sink side for  approx 3 minutes with CGA using AD as indicated. - Met       3. Pt will complete toilet transfer with CGA using appropriate DME - Met       4. Pt will complete toilet hygiene with CGA using appropriate DME as  indicated. - Met   LONG TERM GOAL REVIEW:       1. Pt will  complete LB dress with SPV using AD as indicated - Met       2. Pt will complete oral care/grooming tasks standing at sink side for  approx 7 minutes with SPV using AD as indicated. - Met        3. Pt will complete toilet transfer with SPV using appropriate DME - Met       4. Pt will complete toilet hygiene with SPV using appropriate DME as  indicated. - Met       5. Pt's family/caregiver will attend family training and demo ability to  safely and independently provide recommended level of assist needed for ADLs and  functional mobility at discharge - Met    PLAN  Occupational Therapy Plan: Patient is recommended for home health therapy  services.    Team Care Plan  Please review Integrated Patient View Care Plan Flowsheet for Team identified  Problems, Interventions, and Goals.    Identified problems from team documentation:  Problem: Impaired Cognition  Cognition: Primary Team Goal: Pt will answer orientation and biographical  questions with >90% accuracy given min cuing in order to improve memory./    Problem: Impaired Leisure Skills  Leisure Skills: Primary Team Goal: Pt and caregiver will describe opportunities  and resources for social engagement in the community./    Problem: Impaired Mobility  Mobility: Primary Team Goal: Pt will correctly demonstrate safe sit<>stand  transfers and ambulation using FWW under direct SUPV or touch assist via  clinical team./Active    Problem: Impaired Pain Management  Pain Mgmt: Primary Team Goal: Maintain comfort level /Active    Problem: Impaired Self-care Mgmt/ADL/IADL  Self Care: Primary Team Goal: Pt will complete toilet transfers and all  associated tasks with supervision using AD as indicated to ensure safety./    Problem: Safety Risk and Restraint  Safety: Primary Team Goal: Pt. will recognize limitations and call for  assistance before attempting mobility 100% of the time in order to prevent fall  while in rehab/Active    Status update for discharge:     Self Care  Management:   Primary Goal: Met  Discharge Status Comment:   Pt requires supervision with toilet transfers using  a FWW and a grab bar to ensure safety    3 Hour Rule Minutes: 60 minutes of OT treatment this session count towards  intensity and duration of therapy requirement. Patient was seen for the full  scheduled time of OT treatment this session.  Therapy Mode Minutes: Individual: 60 minutes.    Signed by: Lorie Apley, OT 04/27/2020 10:00:00 AM

## 2020-04-27 NOTE — Rehab Discharge Summary ([facility]) (Signed)
NAMECILICIA BORDEN  MRN: [id number]  Account: [id number]  Session Start: 04/27/2020 2:00:00 PM  Session Stop: 04/27/2020 3:00:00 PM    Total Treatment Minutes: 60.00 Minutes    Physical Therapy  Inpatient Rehabilitation Discharge Summary and Note    Rehab Diagnosis: Closed head injury after a fall, Mild anterior wedge  compression fracture of T7  Demographics:            Age: 16Y            Gender: Female  Primary Language: English  Past Medical History: Anemia  Fibromyalgia  Hyperlipidemia  hypothyroid  Osteoporosis  Rheumatoid  TIA (transient ischemic attack)    PSH  CHOLECYSTECTOMY  History of Present Illness: per EMR, "Yolanda Cox is a 87 y.o. female  with a medical history of anemia fibromyalgia, HLD, hypothyroid, osteoporosis,  rheumatoid, arthritis, TIA, and cerebral hemorrhage who was brought in by EMS to  [facility] status post fall and head injury at the [employer] retirement community.  Per EMR, patient was walking at the retirement community that she is a resident  of when she fell forward striking her forehead on the floor, patient had been  reportedly nauseated prior to the fall. Per EMR, patient had a change in her  mental status while in route to the hospital. At this time patient has a flat  affect and very delayed responses, patient is alert to self and place. Patient  reported a headache and points to the back of her head and reports of a  backache. While in the ER she had a episode of tonic-clonic seizure as described  by her daughter.  Patient has history of traumatic brain bleed in [place]  about a year ago.  She also has history of normal pressure hydrocephalus. She  lives alone in [place] independent living in a 1 level home with no steps to  enter or inside. Prior to her recent decline she was independent for all  mobility and ADLs. Currently she has min a to mod a for bed mobility and  transfers, She waslked 20 feet mod a with a rw. She is min a for grooming. She  has  mild-moderately decreased cognitive/linguitci skills characterized by  decreased attention/concentration, short term memory, and executive functioning.  The patient has been working well with therapy services to include PT, OT, and  SLP to address functional mobility, self-care deficit, cognition, and  swallowing.  The patient would benefit from an intensive level of rehab along  with close medical management of multiple medical conditions and rehab MD to  drive the rehab process.  The patient is medically stable for IRF level of  care."   Date of Onset: 04/09/2020   Date of Admission: 04/11/2020 5:40:00 PM  Rehabilitation Precautions/Restrictions:   Fall precaution, skin precaution, seizure precaution, HOH, impaired vision,  delayed mental processing    SUBJECTIVE  Patient Report: Pt received upright in WC, w/ tray table in position, all  personal items w/in reach, call bell w/in reach, and chair alarm activated. Pt  reports that she is "happy" to be going home soon.  Pain: Patient currently without complaints of pain.    OBJECTIVE  Vital Signs:                       Before Activity              After Activity  Vitals  Time  14:00                        -  Position/Activity    Sitting upright in WC        -  BP Systolic          130                          -  BP Diastolic         60                           -  Pulse                64                           -  O2 Saturation        98%                          -  Bed Mobility: In supine, pt requires SUPV for scooting L<>R and scooting HOB <>  FOB w/no modifications. Pt benefits from VC's for centering herself in bed.    CARE Tool Discharge Performance  Mobility Functional Assessment  Roll Left and Right: 04 - Supervision: Helper provides supervision for safety or  verbal cues ONLY  Assistive Device(s):   None    Sit to Lying: 04 - Supervision: Helper provides supervision for safety or verbal  cues ONLY  Assistive Device(s):   None    Lying to Sitting  on Side of Bed: 04 - Supervision: Helper provides supervision  for safety or verbal cues ONLY  Assistive Device(s):   None    Sit to Stand: 04 - Supervision: Helper provides supervision for safety or verbal  cues ONLY  Assistive Device(s):   Rolling walker   SUPV only    Chair/Bed-to-Chair Transfer: 04 - Supervision: Helper provides supervision for  safety or verbal cues ONLY  Assistive Device(s):   Rolling walker   w/ SUPV only    Car Transfer: 04 - Supervision: Helper provides supervision for safety or verbal  cues ONLY   Using RW, pt benefits from VC's from daughter to slow sequencing for increased  safety and environmental awareness.    Walk 10 Feet: 04 - Supervision: Helper provides supervision for safety or verbal  cues ONLY   Using RW, pt maintains COM near RW for short household distances    Walk 50 Feet with Two Turns: 04 - Supervision: Helper provides supervision for  safety or verbal cues ONLY   Using RW, pt demonstrates decreased cadence and step length w/in distracting  environments and benefits from VCing for initiating steps w/ heel vs toe to  increase gait speed and step length.    Walk 150 Feet:  04 - Supervision: Helper provides supervision for safety or  verbal cues ONLY   Using RW, pt demonstrates decreased cadence and step length w/in distracting  environments and benefits from VCing for initiating steps w/ heel vs toe to  increase gait speed and step length.    Walking 10 Feet on Uneven Surface: 04 - Supervision: Helper provides supervision  for safety or verbal cues ONLY   Using RW and decreased step length for increased safety while navigating  uneven terrain  1 Step (curb):  04 - Touching Assistance: Helper provides  touching/steadying/contact guard   w/ RW and VCing for engaging RW brakes prior to initiating transfer. Pt  demonstrates appropriate sequencing.    4 Steps:  09 - Not applicable.   Pt reports not using stairs prior to admission to acute rehab    12 Steps: 09 - Not  applicable.   Pt reports not using stairs prior to admission to rehab    Wheelchair: Patient does not use a wheelchair or scooter.    Today's Treatment Interventions:       Therapeutic Activities:  PT treatment focus on increasing dynamic standing  balance strategies, increasing trunk control, and gait training w/ dual  cognitive task performance. In standing w/ 2 helpers (1 helper posterior to pt,  CGA at hips, 1 helper /R/3 ft anterior to pt) static ball toss performed, L<>R  side-stepping for 10 ft, and forward walking for 10 ft, retro walking for 10 ft  while tossing beach ball for increasing dual UE coordinaiton and  dynamic  standing balance for increased motor planning. Gait training: Ambulated /R/20 ft  w/in agility ladder w/ Min A from helper for part task practice of increasing  step height to decrease shuffling gait pattern. Pt ambulated 2 x 150 feet, 2 x  50 feet with 4WW at SUPV level with VCs to stand tall, remain close to front of  walker, and to pick up feet to prevent shuffling. NMRE via standing balance: In  mod plantigrade, pt rolled beach ball to helper across a mat while 2nd helper  applied gentle A-P, P-A, and L<>R perturbations to challenge dynamic balance  while pt performed dual cog of task of listing items w/in various categories to  increase pt's safety and environmental awareness w/in a busy environment.  Patient was returned to or left in bed. Handoff to nurse completed.  Bed alarm in place and activated.  Oriented to call bell and placed within reach.  Personal items within reach.  Assistive devices positioned out of reach.  Bed placed in lowest position.    Education Provided:    Education Provided: Plan of care. Safety issues and interventions. Supervision  requirements. Use of adaptive devices. Functional transfers. Gait.       Audience: Patient.       Mode: Explanation.  Demonstration.         Response: Applied knowledge.  Verbalized understanding.  Demonstrated skill.  Needs  reinforcement.    Lower Extremity Orthosis: Patient does not present with foot drop or ankle  instability and does not need an orthotic.  Home Exercise Program: The patient was provided with an individualized home  exercise program.  Equipment Provided/Ordered/Recommended: The patient has been given voluntary  selection of a medical equipment provider, as per federal law. Arrangements have  been made with a company of the patient?s choosing. The patient has been  educated that choosing a company outside of [employer] company?s preferred  provider may affect their insurance coverage.  The patient has been provided  with information on care of equipment as appropriate.   PT recommending consistent use of FWW or RW; daughter confirmed pt owns RW at  home prior to hospital admission.    ASSESSMENT  Summary of Progress and Current Status: Yolanda Cox is an 87 y/o F admitted  to Acute IP Rehab on 04/11/20, s/p closed head injury after a syncopal episode  resulting in mild anterior wedge compression fx of T7. Upon IE, pt  presented w/  increased time and effort in performing bed mobility,  transfers, and functional  mobility tasks with s/s of delayed motor planning, visual impairments and L  sided inattention, and mild cognitive deficits and confusion. During pt's  admission in acute rehab, she has progressed from grossly Min A to SUPV for  functional transfers and gait using RW. Pt's daughter, [name], fully  participated  in formal family training session on 04/26/20 that included  review of safe  mobilization techniques, car transfers, fall prevention strategies and stroke  awareness education to maintain safety awareness upon D/C home.  PT recommending  pt D/C to home health w/ 24 hour SUPV provided by a care giver. Pt will continue  to benefit from  PT and OT to further increase dynamic balance strategies in 3  planes of movement and to progress to PLOF for decreased caregiver burden.    Progress Toward Goals (final  status):   LONG TERM GOAL REVIEW:       1. Pt will perform 8 sit<>stand transfer from bed <>WC using LRAD and touch  assist, without LOB or rest breaks to demonstrate increased cardiovascular and  BLE strength and endurance. - Met       2. Pt will ambulate >250 feet w/ FWW and SUPV to   demonstrate an increase in endurance and functional independence to return to  PLOF - Met       3. Pt will reach outside of BOS 10 consecutive times in standing w/ Min  Assist to demonstrate weight shift in all planes to demonstrate increased  dynamic sitting balance and increased safety. - Met       4. Pt will ambulate >50 feet w/ FFW, while navigating moving obstacles and  touch assist, to demonstrate increased focus, safety, and functional mobility in  a distracting environment. - Met    PLAN  Physical Therapy Plan: Patient is recommended for home health therapy services.    Team Care Plan  Please review Integrated Patient View Care Plan Flowsheet for Team identified  Problems, Interventions, and Goals.    Identified problems from team documentation:  Problem: Impaired Cognition  Cognition: Primary Team Goal: Pt will answer orientation and biographical  questions with >90% accuracy given min cuing in order to improve memory./Met    Problem: Impaired Leisure Skills  Leisure Skills: Primary Team Goal: Pt and caregiver will describe opportunities  and resources for social engagement in the community./    Problem: Impaired Mobility  Mobility: Primary Team Goal: Pt will correctly demonstrate safe sit<>stand  transfers and ambulation using FWW under direct SUPV or touch assist via  clinical team./Active    Problem: Impaired Pain Management  Pain Mgmt: Primary Team Goal: Maintain comfort level /Active    Problem: Impaired Self-care Mgmt/ADL/IADL  Self Care: Primary Team Goal: Pt will complete toilet transfers and all  associated tasks with supervision using AD as indicated to ensure safety./    Problem: Safety Risk and Restraint  Safety:  Primary Team Goal: Pt. will recognize limitations and call for  assistance before attempting mobility 100% of the time in order to prevent fall  while in rehab/Active    Status update for discharge:     Mobility:   Primary Goal: Met  Discharge Status Comment:   Pt requires direct SUPV using RW or FWW for all  functional transfers, and ambulation.    3 Hour Rule Minutes: 60 minutes of PT treatment this session count towards  intensity and duration of  therapy requirement. Patient was seen for the full  scheduled time of PT treatment this session.  Therapy Mode Minutes: Individual: 60 minutes.      Assessment and treatment was provided under the 1:1 direct supervision of Juluis Pitch, PT, DPT, NCS    Signed by: Everlene Other, SPT 04/27/2020 3:00:00 PM    Direct 1:1 supervision of Everlene Other, student of Physical Therapy, was  provided throughout this session - CoSigned By: Juluis Pitch, PT 04/27/2020  8:41:38 PM

## 2020-04-27 NOTE — Rehab Progress Note ([facility]) (Signed)
[name]  MRN: [id number]  Account: [id number]  Session Start: 04/27/2020 12:00:00 AM  Session Stop: 04/27/2020 12:00:00 AM    Total Treatment Minutes:  Minutes    Rehabilitation Nursing  Inpatient Rehabilitation Shift Assessment    Rehab Diagnosis: Closed head injury after a fall, Mild anterior wedge  compression fracture of T7  Demographics:            Age: 33Y            Gender: Female  Primary Language: English    Date of Onset:  04/09/2020  Date of Admission: 04/11/2020 5:40:00 PM    Rehabilitation Precautions Restrictions:   Fall precaution, skin precaution, seizure precaution, HOH, impaired vision,  delayed mental processing    Patient Report: "am okay"  Patient/Caregiver Goals:  "to get better"    Wounds/Incisions: See Epic    Medication Review: No clinically significant medication issues identified this  shift.    Bowel and Bladder Output:                       Bladder (# only)             Bowel (# only)  Number of Episodes  Continent            1                            0  Incontinent          0                            0    Additional Education Provided:    Education Provided: Pain management. Medication options. Safety issues and  interventions. Fall protocol. Supervision requirements. Skin/wound care.  Prevention of skin breakdown.       Audience: Patient.       Mode: Explanation.       Response: Verbalized understanding.    TEAM CARE PLAN  Identified problems from team documentation:  Problem: Impaired Cognition  Cognition: Primary Team Goal: Pt will answer orientation and biographical  questions with >90% accuracy given min cuing in order to improve memory./    Problem: Impaired Leisure Skills  Leisure Skills: Primary Team Goal: Pt and caregiver will describe opportunities  and resources for social engagement in the community./    Problem: Impaired Mobility  Mobility: Primary Team Goal: Pt will correctly demonstrate safe sit<>stand  transfers and ambulation using FWW under direct SUPV or  touch assist via  clinical team./Active    Problem: Impaired Self-care Mgmt/ADL/IADL  Self Care: Primary Team Goal: Pt will complete toilet transfers and all  associated tasks with supervision using AD as indicated to ensure safety./    Problem: Safety Risk and Restraint  Safety: Primary Team Goal: Pt. will recognize limitations and call for  assistance before attempting mobility 100% of the time in order to prevent fall  while in rehab/Active    Please review Integrated Patient View Care Plan Flowsheet for Team identified  Problems, Interventions, and Goals.    Signed by: Yolanda Shanks, RN 04/27/2020 12:10:00 AM

## 2020-04-27 NOTE — Discharge Summary (Signed)
REHABILITATION MEDICINE  DISCHARGE SUMMARY AND DISCHARGE NOTE    Patient Identification  Yolanda Cox is a 87 y.o. female.  DOB:  1929/09/12  Admit Date:  04/11/2020  Discharge date:    Attending Provider: Charlestine Massed, MD                                     Admission Diagnoses: Brain injury [S06.9X9A]  Debility [R53.81]    Disposition:       Stable, discharge to home.    Patient Instructions:       Please make an appointment to follow up with your primary care physician within the next 2-3 weeks.    Please make an appointment to follow up with neurology within the next 1-2 weeks:  Rana, Brantley Stage, MD. Schedule an appointment as soon as possible for a visit.    Specialty: Neurology  Contact information:  [address]  [address]  [place] [address]  [phone]        No driving until cleared by a physician or occupational therapist.       Discharge Medication List      Taking    acetaminophen 325 MG tablet  Dose: 650 mg  Commonly known as: TYLENOL  Take 2 tablets (650 mg total) by mouth every 6 (six) hours as needed for Pain     amLODIPine 10 MG tablet  Dose: 10 mg  Commonly known as: NORVASC  Take 1 tablet (10 mg total) by mouth daily     aspirin 81 MG chewable tablet  Dose: 81 mg  Chew 1 tablet (81 mg total) by mouth daily  Replaces: aspirin 325 MG tablet     atorvastatin 40 MG tablet  Dose: 40 mg  What changed: when to take this  Commonly known as: LIPITOR  Take 1 tablet (40 mg total) by mouth nightly     calcium carbonate 600 MG Tabs tablet  Dose: 600 mg  Commonly known as: OS-CAL  Take 600 mg by mouth daily     folic acid 1 MG tablet  Dose: 1 mg  Commonly known as: FOLVITE  Take 1 mg by mouth daily     levETIRAcetam 500 MG tablet  Dose: 500 mg  Commonly known as: KEPPRA  Take 1 tablet (500 mg total) by mouth every 12 (twelve) hours     levothyroxine 75 MCG tablet  Dose: 75 mcg  Commonly known as: SYNTHROID  Take 1 tablet (75 mcg total) by mouth Once a day at 6:00am     methotrexate 2.5 MG  tablet  For: Rheumatoid Arthritis  Take by mouth once a week     multivitamin capsule  Dose: 1 capsule  Take 1 capsule by mouth daily     OMEGA-3 FISH OIL PO  Dose: 1,000 mg  Take 1,000 mg by mouth daily     ondansetron 4 MG disintegrating tablet  Dose: 4 mg  Commonly known as: ZOFRAN-ODT  Take 1 tablet (4 mg total) by mouth every 8 (eight) hours as needed for Nausea     sertraline 50 MG tablet  Dose: 50 mg  Commonly known as: ZOLOFT  Take 1 tablet (50 mg total) by mouth daily     vitamin D 25 MCG (1000 UT) tablet  Dose: 25 mcg  Commonly known as: cholecalciferol  Take 25 mcg by mouth daily  STOP taking these medications    albuterol-ipratropium 2.5-0.5(3) mg/3 mL nebulizer  Commonly known as: DUO-NEB     amoxicillin-clavulanate 875-125 MG per tablet  Commonly known as: AUGMENTIN     aspirin 325 MG tablet  Replaced by: aspirin 81 MG chewable tablet     butalbital-acetaminophen-caffeine 50-325-40 MG per tablet  Commonly known as: FIORICET     guaiFENesin 100 MG/5ML Soln  Commonly known as: ROBITUSSIN     insulin lispro 100 UNIT/ML injection  Commonly known as: HumaLOG     lactobacillus/streptococcus Caps     silver sulfADIAZINE 1 % cream  Commonly known as: SILVADENE            Current Facility-Administered Medications:     acetaminophen (TYLENOL) tablet 650 mg, 650 mg, Oral, Q6H PRN, Charlestine Massed, MD, 650 mg at 04/13/20 2238    albuterol-ipratropium (DUO-NEB) 2.5-0.5(3) mg/3 mL nebulizer 3 mL, 3 mL, Nebulization, Q6H PRN, Charlestine Massed, MD    amLODIPine (NORVASC) tablet 10 mg, 10 mg, Oral, Daily, Charlestine Massed, MD, 10 mg at 04/28/20 1610    aspirin tablet 325 mg, 325 mg, Oral, Daily, Charlestine Massed, MD, 325 mg at 04/28/20 0859    atorvastatin (LIPITOR) tablet 40 mg, 40 mg, Oral, QHS, Charlestine Massed, MD, 40 mg at 04/27/20 2045    butalbital-acetaminophen-caffeine (FIORICET) 50-325-40 MG per tablet 1 tablet, 1 tablet, Oral, Q4H PRN, Charlestine Massed, MD, 1 tablet at 04/15/20 0947    calcium  citrate-vitamin D (CITRACAL+D) 315-250 MG-UNIT per tablet 2 tablet, 2 tablet, Oral, Daily, Charlestine Massed, MD, 2 tablet at 04/28/20 [phone]    Nursing communication: Adult Hypoglycemia Treatment Algorithm, , , Until Discontinued **AND** dextrose (GLUCOSE) 40 % oral gel 15 g of glucose, 15 g of glucose, Oral, PRN **AND** dextrose 50 % bolus 12.5 g, 12.5 g, Intravenous, PRN **AND** glucagon (rDNA) (GLUCAGEN) injection 1 mg, 1 mg, Intramuscular, PRN, Charlestine Massed, MD    folic acid (FOLVITE) tablet 1 mg, 1 mg, Oral, Daily, Harrie Jeans, Grantham Hippert A, MD, 1 mg at 04/28/20 0859    insulin lispro (HumaLOG) injection 1-3 Units, 1-3 Units, Subcutaneous, QHS, 1 Units at 04/25/20 2127 **AND** NSG Communication: Glucose POCT order, , , At bedtime, Charlestine Massed, MD    insulin lispro (HumaLOG) injection 1-5 Units, 1-5 Units, Subcutaneous, TID AC, 1 Units at 04/27/20 1753 **AND** NSG Communication: Glucose POCT order, , , AC, Charlestine Massed, MD    lactobacillus/streptococcus (RISAQUAD) capsule 1 capsule, 1 capsule, Oral, Daily, Charlestine Massed, MD, 1 capsule at 04/28/20 0859    levETIRAcetam (KEPPRA) tablet 500 mg, 500 mg, Oral, Q12H SCH, Charlestine Massed, MD, 500 mg at 04/28/20 5409    levothyroxine (SYNTHROID) tablet 75 mcg, 75 mcg, Oral, Daily at 0600, Charlestine Massed, MD, 75 mcg at 04/28/20 0617    magnesium hydroxide (MILK OF MAGNESIA) 400 MG/5ML suspension 30 mL, 30 mL, Oral, Daily PRN, Charlestine Massed, MD    melatonin tablet 3 mg, 3 mg, Oral, QHS PRN, Charlestine Massed, MD    multivitamin tablet 1 tablet, 1 tablet, Oral, Daily, Charlestine Massed, MD, 1 tablet at 04/28/20 0859    ondansetron (ZOFRAN-ODT) disintegrating tablet 4 mg, 4 mg, Oral, Q8H PRN, Charlestine Massed, MD    senna-docusate (PERICOLACE) 8.6-50 MG per tablet 2 tablet, 2 tablet, Oral, QHS PRN, Charlestine Massed, MD, 2 tablet at 04/15/20 2205    sertraline (ZOLOFT) tablet 50 mg, 50 mg, Oral, Daily,  Charlestine Massed, MD, 50 mg at 04/28/20 1610     vitamin D (cholecalciferol) tablet 25 mcg, 25 mcg, Oral, Daily, Charlestine Massed, MD, 25 mcg at 04/28/20 0859     Scheduled Meds: PRN Meds:    amLODIPine, 10 mg, Oral, Daily  aspirin, 325 mg, Oral, Daily  atorvastatin, 40 mg, Oral, QHS  calcium citrate-vitamin D, 2 tablet, Oral, Daily  folic acid, 1 mg, Oral, Daily  insulin lispro, 1-3 Units, Subcutaneous, QHS  insulin lispro, 1-5 Units, Subcutaneous, TID AC  lactobacillus/streptococcus, 1 capsule, Oral, Daily  levETIRAcetam, 500 mg, Oral, Q12H [facility]  levothyroxine, 75 mcg, Oral, Daily at 0600  multivitamin, 1 tablet, Oral, Daily  sertraline, 50 mg, Oral, Daily  vitamin D, 25 mcg, Oral, Daily        Continuous Infusions:   acetaminophen, 650 mg, Q6H PRN  albuterol-ipratropium, 3 mL, Q6H PRN  butalbital-acetaminophen-caffeine, 1 tablet, Q4H PRN  dextrose, 15 g of glucose, PRN   And  dextrose, 12.5 g, PRN   And  glucagon (rDNA), 1 mg, PRN  magnesium hydroxide, 30 mL, Daily PRN  melatonin, 3 mg, QHS PRN  ondansetron, 4 mg, Q8H PRN  senna-docusate, 2 tablet, QHS PRN              Medication Review  A complete drug regimen review was completed: Yes   No drug issues were found within the last 24 hours    Discharge Diagnoses:  And Hospital Course:       #New onset seizures  - patient with syncopal episode in assisted living facility and then seizure activity while in ED  - spot EEG negative  - CT head and MRI brain negative for acute intracranial process  - seen by neurology, Dr. Marney Doctor while in acute hospital who she will need to follow up with after discharge  - seizure precautions  - continue with keppra 500mg  2x/day  - will give one month supply of keppra in discharge and she will follow up with neurology    #Hx of traumatic brain bleed/NPH  - CT and MRI showing chronic changes only; without evidence of hydrocephalus  - does have some cognitive impairments that may be secondary to this prior injury  - SLP has been working with patient on cognition    #Viral  Gastroenteritis  - patient was sent to the ED on 1/21 and returned 1/23; underwent extensive sepsis workup and was diagnosed with viral gastroenteritis. She improved with conservative management    #Aspiration pneumonia  - thought to be secondary to seizure activity  - was on zosyn while in acute hospital and transitioned to augmentin to complete 5 day course on admission to rehab, stop date 1/16  -  patient has completed antibiotics, no current signs/symptoms of infection    #HTN  - amlodipine 10mg     #DM  - HbA1c 7.6  - per daughter the patient's blood sugar elevates when she is stressed and then it returns to normal, decision was made to hold off on starting an oral agent    #HLD  - atorvastatin 40mg     #Hypothyroidism  - levothyroxine    #Anemia  - likely secondary to chronic disease  - Hb 10.5 on admission, and was 9.9 most recently on 1/26    #T7 compression fracture  - age indeterminate with no acute findings on exam, continue to monitor    #Depression  - zoloft 50mg     #Rheumatoid arthritis  - on methotrexate weekly, can  restart after discharge from rehab    #Osteoporosis  - continue vitamin D and calcium supplements      Discharge Functional Status:        CARE Tool Discharge Performance  Mobility Functional Assessment  Roll Left and Right: 04 - Supervision: Helper provides supervision for safety or  verbal cues ONLY  Assistive Device(s):   None    Sit to Lying: 04 - Supervision: Helper provides supervision for safety or verbal  cues ONLY  Assistive Device(s):   None    Lying to Sitting on Side of Bed: 04 - Supervision: Helper provides supervision  for safety or verbal cues ONLY  Assistive Device(s):   None    Sit to Stand: 04 - Supervision: Helper provides supervision for safety or verbal  cues ONLY  Assistive Device(s):   Rolling walker   SUPV only    Chair/Bed-to-Chair Transfer: 04 - Supervision: Helper provides supervision for  safety or verbal cues ONLY  Assistive Device(s):   Rolling  walker   w/ SUPV only    Car Transfer: 04 - Supervision: Helper provides supervision for safety or verbal  cues ONLY   Using RW, pt benefits from VC's from daughter to slow sequencing for increased  safety and environmental awareness.    Walk 10 Feet: 04 - Supervision: Helper provides supervision for safety or verbal  cues ONLY   Using RW, pt maintains COM near RW for short household distances    Walk 50 Feet with Two Turns: 04 - Supervision: Helper provides supervision for  safety or verbal cues ONLY   Using RW, pt demonstrates decreased cadence and step length w/in distracting  environments and benefits from VCing for initiating steps w/ heel vs toe to  increase gait speed and step length.    Walk 150 Feet:  04 - Supervision: Helper provides supervision for safety or  verbal cues ONLY   Using RW, pt demonstrates decreased cadence and step length w/in distracting  environments and benefits from VCing for initiating steps w/ heel vs toe to  increase gait speed and step length.    Walking 10 Feet on Uneven Surface: 04 - Supervision: Helper provides supervision  for safety or verbal cues ONLY   Using RW and decreased step length for increased safety while navigating  uneven terrain    1 Step (curb):  04 - Touching Assistance: Helper provides  touching/steadying/contact guard   w/ RW and VCing for engaging RW brakes prior to initiating transfer. Pt  demonstrates appropriate sequencing.    4 Steps:  09 - Not applicable.   Pt reports not using stairs prior to admission to acute rehab    12 Steps: 09 - Not applicable.   Pt reports not using stairs prior to admission to rehab    Wheelchair: Patient does not use a wheelchair or scooter.    History:        Patient is an 87 y.o. right hand-dominant female with prior traumatic brain bleed, NPH, HTN, DM, HLD, hypothyroidism, RA, and osteoporosis who presented to the ED from [facility] assisted living after having a syncopal episode and hitting her head. Reportedly  patient was altered while in the ambulance and had seizure activity while in the ED. CT head was done and showed no evidence of acute intracranial injury. EEG was done and was reported as normal. MRI brain showed moderate generalized intraparenchymal volume loss with moderately advanced chronic microvascular changes but no acute changes. Neurology was consulted and patient was  started on keppra. She was found to have pneumonia, which was thought likely to be secondary to aspiration from seizure activity. She was started initially on IV abx and then transitioned to oral by the time of discharge. Per documentation the remainder of her hospitalization was unremarkable. She was evaluated by PT/OT/SLP and deemed appropriate for acute inpatient rehab and was transferred to the [facility] inpatient rehab unit on 04/11/2020.    Prior to this admission she lived in an assisted living facility where she is able to get help for many of her daily tasks.    Discharge Exam:  Vitals:    04/27/20 0814 04/27/20 1816 04/28/20 0604 04/28/20 0859   BP: 127/70 136/68 134/72 130/70   Pulse: (!) 57 64 (!) 59    Resp: 15 17 16     Temp: 97.5 F (36.4 C) 98.8 F (37.1 C) 98.2 F (36.8 C)    TempSrc: Oral Oral Oral    SpO2: 98% 96% 97%    Weight:       Height:           Patient is getting ready for discharge.  No new issues.    General: WN/WD, in NAD  HEENT: NC/AT, moist mucosal membranes, anicteric sclera  Cardiac: regular rate, extremities warm and well perfused  Resp: non-labored breathing, acyanotic  Abdomen: soft, non-tender, non-distended  Neuro:Awake and alert, oriented to person and place, knows the month and year but not date, speech fluent and spontaneous without dysarthriabut is slowed, mild proximal weakness but moves extremities with AG strength throughout      No results for input(s): GLUCOSEWHOLE in the last 24 hours.    Recent Labs   Lab 04/26/20  0510 04/23/20  0357 04/22/20  0350 04/21/20  1528   WBC 10.37* 9.48  18.81* 27.12*   Hgb 9.9* 9.0* 8.8* 10.9*   Hematocrit 30.2* 28.3* 28.0* 33.6*   Platelets 235 198 189 233        Recent Labs   Lab 04/26/20  0510 04/23/20  0357 04/22/20  0350 04/21/20  1528   Sodium 142 139 140 138   Potassium 3.4* 3.8 3.5 3.5   Chloride 108 109 110 104   CO2 24 22 22  20*   BUN 15.0 15.0 12.0 17.0   Creatinine 0.7 0.7 0.7 0.8   Calcium 9.4 8.8 8.6 9.8   Albumin  --   --   --  4.2   Protein, Total  --   --   --  7.7   Bilirubin, Total  --   --   --  0.9   Alkaline Phosphatase  --   --   --  72   ALT  --   --   --  15   AST (SGOT)  --   --   --  23   Glucose 137* 165* 128* 172*             Results     Procedure Component Value Units Date/Time    Glucose Whole Blood - POCT [[id number]]  (Abnormal) Collected: 04/28/20 0736     Updated: 04/28/20 0754     Whole Blood Glucose POCT 126 mg/dL     Glucose Whole Blood - POCT [[id number]]  (Abnormal) Collected: 04/27/20 2103     Updated: 04/27/20 2118     Whole Blood Glucose POCT 197 mg/dL     Glucose Whole Blood - POCT [[id number]]  (Abnormal) Collected: 04/27/20 1617     Updated: 04/27/20  1652     Whole Blood Glucose POCT 188 mg/dL     Glucose Whole Blood - POCT [[id number]]  (Abnormal) Collected: 04/27/20 1143     Updated: 04/27/20 1151     Whole Blood Glucose POCT 200 mg/dL           Radiology: all results from this admission  CT Reconstruction T-spine    Result Date: 04/08/2020   Mild anterior wedge compression fracture at T7 is age indeterminate, but has imaging features of chronicity. No definitive acute thoracic vertebral fracture, compression deformity or traumatic malalignment. Diffuse thoracic spondylosis. Waynard Edwards, MD  04/08/2020 6:00 PM    CT Head WO Contrast    Result Date: 04/21/2020   No acute intracranial abnormality. Diffuse cerebral volume loss and chronic small vessel ischemic changes. Georgann Housekeeper, MD  04/21/2020 4:26 PM    CT Head without  Contrast    Result Date: 04/08/2020   1. No CT evidence of acute intracranial abnormality. 2. Cerebral  volume loss, intracranial atherosclerosis and moderate sequela of chronic small vessel ischemic disease. Waynard Edwards, MD  04/08/2020 5:03 PM    CT Angiogram Chest    Result Date: 04/08/2020  COMBINED   No definite acute pathology noted within the chest abdomen or pelvis. Groundglass opacities within the right lower lobe could represent infection. Trace left pleural effusion. Jimmye Norman, MD  04/08/2020 6:21 PM    CT Cervical Spine WO Contrast    Result Date: 04/08/2020   No acute cervical spine fracture or traumatic malalignment. Cervical spondylosis and facet arthrosis with degenerative spondylolisthesis as described. Waynard Edwards, MD  04/08/2020 5:48 PM    MRI Brain W WO Contrast    Result Date: 04/09/2020   1. No MR evidence of acute intracranial abnormality, infarct, mass, hemorrhage or hydrocephalus. 2. Moderate generalized intraparenchymal volume loss with moderately advanced chronic microvascular sequela. Waynard Edwards, MD  04/09/2020 10:15 PM    CT Abdomen Pelvis W IV/ WO PO Cont    Result Date: 04/08/2020  COMBINED   No definite acute pathology noted within the chest abdomen or pelvis. Groundglass opacities within the right lower lobe could represent infection. Trace left pleural effusion. Jimmye Norman, MD  04/08/2020 6:21 PM    XR Chest  AP Portable    Result Date: 04/21/2020  Stable appearances of the chest. Atelectasis or patchy consolidation at the left lung bases remain stable. Colonel Bald, MD  04/21/2020 3:59 PM    XR Chest AP Portable    Result Date: 04/21/2020  Bibasilar atelectasis and/or patchy consolidation left greater than right. Colonel Bald, MD  04/21/2020 2:36 PM     Chest AP Portable    Result Date: 04/08/2020  . Marland Kitchen Mild left basilar atelectasis Marty Heck, MD  04/08/2020 4:44 PM    CT Reconstruction L-spine    Result Date: 04/08/2020   No acute lumbar vertebral fracture, compression deformity or traumatic malalignment. Degenerative spondylosis and facet arthrosis  superimposed upon S-shaped rotatory scoliosis, multilevel lateral listhesis and L5-S1 anterolisthesis. Waynard Edwards, MD  04/08/2020 5:52 PM      Discharge planning, preparation, and coordination took more than 35 minutes.    Gillis Santa, MD  [facility]      If there are questions or concerns about the content of this note or information contained within the body of this dictation they should be addressed directly with the author for clarification.

## 2020-04-27 NOTE — Rehab Progress Note ([employer]il[employer]) (Signed)
Yolanda Cox  MRN: [id number]  Account: [id number]  Session Start: 04/27/2020 12:00:00 AM  Session Stop: 04/27/2020 12:00:00 AM    Total Treatment Minutes:  Minutes    Rehabilitation Nursing  Inpatient Rehabilitation Shift Assessment    Rehab Diagnosis: Closed head injury after a fall, Mild anterior wedge  compression fracture of T7  Demographics:            Age: 61Y            Gender: Female  Primary Language: English    Date of Onset:  04/09/2020  Date of Admission: 04/11/2020 5:40:00 PM    Rehabilitation Precautions Restrictions:   Fall precaution, skin precaution, seizure precaution, HOH, impaired vision,  delayed mental processing    Patient Report: "I'm doing good"  Patient/Caregiver Goals:  "to get better"    Wounds/Incisions: See Epic    Medication Review: No clinically significant medication issues identified this  shift.    Bowel and Bladder Output:                       Bladder (# only)             Bowel (# only)  Number of Episodes  Continent            3                            0  Incontinent          0                            0    Additional Education Provided:    Education Provided: Safety issues and interventions. Fall protocol. Supervision  requirements. Skin/wound care. Prevention of skin breakdown. Safety. Fall  prevention/balance training.       Audience: Patient.       Mode: Explanation.       Response: Verbalized understanding.    TEAM CARE PLAN  Identified problems from team documentation:  Problem: Impaired Cognition  Cognition: Primary Team Goal: Pt will answer orientation and biographical  questions with >90% accuracy given min cuing in order to improve memory./Met    Problem: Impaired Leisure Skills  Leisure Skills: Primary Team Goal: Pt and caregiver will describe opportunities  and resources for social engagement in the community./    Problem: Impaired Mobility  Mobility: Primary Team Goal: Pt will correctly demonstrate safe sit<>stand  transfers and ambulation using FWW under  direct SUPV or touch assist via  clinical team./Met    Problem: Impaired Pain Management  Pain Mgmt: Primary Team Goal: Maintain comfort level /Active    Problem: Impaired Self-care Mgmt/ADL/IADL  Self Care: Primary Team Goal: Pt will complete toilet transfers and all  associated tasks with supervision using AD as indicated to ensure safety./Met    Problem: Safety Risk and Restraint  Safety: Primary Team Goal: Pt. will recognize limitations and call for  assistance before attempting mobility 100% of the time in order to prevent fall  while in rehab/Active    Please review Integrated Patient View Care Plan Flowsheet for Team identified  Problems, Interventions, and Goals.    Signed by: Harlin Rain, RN 04/27/2020 7:32:00 PM

## 2020-04-27 NOTE — Progress Notes (Signed)
Discharge Planning Note:    CM placed a phone call to the patient's daughter this afternoon to follow up with her in regard to discharge for tomorrow. The patient will transition back to ILF at [facility], her daughter has completed family training.     Her daughter has hired caregiver support for the 24/7 caregiver support per therapy.     CM will continue to follow for discharge plans and coordination.     Ms. Dallie Piles, MSW   Rehabilitation Case Manager  [facility] Inpatient Acute Rehab   [address]   [place], [place] [address]  [place] [phone]

## 2020-04-27 NOTE — Rehab Progress Note ([facility]) (Signed)
Yolanda Cox  MRN: [id number]  Account: [id number]  Session Start: 04/26/2020 1:00:00 PM  Session Stop: 04/26/2020 2:00:00 PM    Total Treatment Minutes: 60.00 Minutes    Speech Language Pathology  Inpatient Rehabilitation Progress Note - Brief    Rehab Diagnosis: Closed head injury after a fall, Mild anterior wedge  compression fracture of T7  Demographics:            Age: 30Y            Gender: Female  Rehabilitation Precautions/Restrictions:   Fall precaution, skin precaution, seizure precaution, HOH, impaired vision,  delayed mental processing    SUBJECTIVE  Patient Report: "Can I change my clothes?"  Pain: Patient currently without complaints of pain.    OBJECTIVE    Interventions:       Speech Treatment: Pt greeted bedside requesting to be "cleaned up."  Therefore session targeting memory and sequencing through completion of ADLs  (dressing/grooming). Pt able to sequence the steps for both dressing and  grooming activities given occasional cuing. Of note, when ambulating to the  closet and bathroom, pt did require min cuing for safe walker management.  Additionally, cuing needed for sit-stand sequence, as she often attempts to  stand with both hands on the walker. Remainder of session targeting memory  through "scavenger hunt" in the gift shop. Pt provided with 3-items to locate,  for which she was able to retain x10 minutes given min cuing for repetition.    Patient was left seated in chair in his/her room.  Patient was left seated in chair with family/friends present. Handoff to nurse  completed.  Pain Reassessment: Pain was not reassessed as no pain was reported.    Education Provided:  No education provided this session.    ASSESSMENT  Pt demonstrating improved sequencing skills, only requiring stand by assistance  for ADLs. However, continues to demonstrate safety/problem solving breakdowns  during functional mobility tasks. This will impact her safety upon return home  and further supports need for  24/7 supervision upon d/c to reduce risk for  falling.    PLAN  Continued Speech Language Pathology is recommended to address:  Recommended Frequency/Duration/Intensity: 60-120 minutes 1-2x day, 5-6x week,  for 10-14 days  Continued Activities Contributing Toward Care Plan: dynamic assessment,  neuro-cognitive retraining, memory strategies, finance/medication management  exercises, external aids, brain injury education, family training, and d/c  planning    3 Hour Rule Minutes: 60 minutes of SLP treatment this session count towards  intensity and duration of therapy requirement. Patient was seen for the full  scheduled time of SLP treatment this session.  Therapy Mode Minutes: Individual: 60 minutes.    Signed by: Mikael Spray, CCC/SLP 04/26/2020 2:00:00 PM

## 2020-04-27 NOTE — Rehab Discharge Summary ([facility]) (Signed)
NAMEANGELIE KRAM  MRN: [id number]  Account: [id number]  Session Start: 04/27/2020 10:00:00 AM  Session Stop: 04/27/2020 11:00:00 AM    Total Treatment Minutes: 60.00 Minutes    Speech Language Pathology  Inpatient Rehabilitation  Language Cognitive and Dysphagia Discharge Summary and Note    Rehab Diagnosis: Closed head injury after a fall, Mild anterior wedge  compression fracture of T7  Demographics:            Age: 23Y            Gender: Female  Primary Language: English    Past Medical History: Anemia  Fibromyalgia  Hyperlipidemia  hypothyroid  Osteoporosis  Rheumatoid  TIA (transient ischemic attack)    PSH  CHOLECYSTECTOMY  History of Present Illness: "Kimbria Camposano is a 87 y.o. female with a  medical history of anemia fibromyalgia, HLD, hypothyroid, osteoporosis,  rheumatoid, arthritis, TIA, and cerebral hemorrhage who was brought in by EMS to  [facility] status post fall and head injury at the [employer] retirement community.  Per EMR, patient was walking at the retirement community that she is a resident  of when she fell forward striking her forehead on the floor, patient had been  reportedly nauseated prior to the fall. Per EMR, patient had a change in her  mental status while in route to the hospital. At this time patient has a flat  affect and very delayed responses, patient is alert to self and place. Patient  reported a headache and points to the back of her head and reports of a  backache. While in the ER she had a episode of tonic-clonic seizure as described  by her daughter.  Patient has history of traumatic brain bleed in [place]  about a year ago.  She also has history of normal pressure hydrocephalus. She  lives alone in [place] independent living in a 1 level home with no steps to  enter or inside. Prior to her recent decline she was independent for all  mobility and ADLs. Currently she has min a to mod a for bed mobility and  transfers, She waslked 20 feet mod a with a rw. She  is min a for grooming. She  has mild-moderately decreased cognitive/linguitci skills characterized by  decreased attention/concentration, short term memory, and executive functioning.  The patient has been working well with therapy services to include PT, OT, and  SLP to address functional mobility, self-care deficit, cognition, and  swallowing.  The patient would benefit from an intensive level of rehab along  with close medical management of multiple medical conditions and rehab MD to  drive the rehab process.  The patient is medically stable for IRF level of  care."   Date of Onset: 04/09/2020   Date of Admission: 04/11/2020 5:40:00 PM  Premorbid Functional Level: was living alone within retirement community [facility]). Was independent in all ADLs. Will follow up with daughter regarding  iADLs/driving status.  Social/Educational History: Pt reports living alone in an apartment/retirement  home in [place], [place]. 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 Pt has an adult daughter named [name] that lives and works in  [place], that can help her mother on her days off. Pt reports not  having any hobbies since her vision has deteriorated /R/2 years ago. Pt may not  be reliable historian 2/2 cognitive deficits. PT to confirm pt's subjective  reporting with pt's daughter, [name].  Home  Environment: Pt reports living on the 3rd level of an apartment/assisted  living facility w/ no STE.  Pt reports not being a stair user; instead she  relies on elevator w/in building to access her apartment. Pt reports apartment  is single level, w/ a walk in shower.   DME: 1OX, shower chair, grab bars in shower, grab bars surrounding toilet,  elevated toilet.    Rehabilitation Precautions/Restrictions:   Fall precaution, skin precaution, seizure precaution, HOH, impaired vision,  delayed mental processing    SUBJECTIVE  Patient Report: "I'm sorry I didn't remember much today."  Pain: Patient  currently without complaints of pain.      OBJECTIVE    Current Diet: regular / thin    Today's Treatment Interventions:       Speech Treatment: SLP facilitated discussion relating to d/c planning and  supervision requirements to assess insight and safety awareness. Pt initially  not able to acknowledge need for caregiver; however following education she was  agreeable to the recommendation of 24/7 supervision. SLP assisted pt in  generating list of activities she will need assistance with upon d/c in order to  improve insight into current deficits/functional status. Pt initially requiring  max cuing to identify level of assistance needed, fading to min towards end of  activity. Memory notebook utilized to review orientation concepts as well as  daily schedule/therapist names. Given min cuing to navigate book, pt able to  locate all of these details. Of note, pt able to independently recall details of  her morning OT session without need for external aid.  Patient was left seated in chair in his/her room. Handoff to nurse completed.  Chair alarm in place and activated.  Oriented to call bell and placed within reach.  Personal items within reach.    Home Exercise Program: The patient was provided with an individualized home  exercise program.  Education Provided:    Education Provided: Plan of care. Cognitive functioning. Compensatory cognitive  strategies/aids Safety issues and interventions. Supervision requirements.       Audience: Patient.       Mode: Explanation.  Demonstration.  Teacher, English as a foreign language provided.       Response: Verbalized understanding.  Demonstrated skill.  Needs practice.    ASSESSMENT  Summary of Progress and Current Status: Pt is a 87 yo F who was admitted to  [facility] s/p fall and seizure. Initial ST evaluation revealed moderate cognitive  deficits in the areas of processing, L-attention, and memory. During the pt's  2-week stay in [place], she has made gradual progress towards ST goals. Rate and  degree  of progress was impacted by severity of memory deficits (suspect present  at baseline) and complicated medical course (was admitted to ED last week for  fever and vomiting). Mild-moderate deficits remain in cognition, specifically  relating to memory and problem solving/sequencing. Due to remaining impairments,  pt will require 24/7 supervision upon d/c home to maintain safety during  mobility and ADLs. Additionally, would benefit from ongoing ST in the home  setting.    Progress Toward Goals (final status):   SHORT TERM GOAL REVIEW:       1. Pt will answer orientation and biographical questions with >90% accuracy  given min cuing in order to improve memory. - Met       2. Pt will demonstrate improved sequencing by completing 3-4 step  functional tasks with >80% accuracy given min-mod cuing. - Met       3. Pt will solve  simple problems related to daily living with >80% accuracy  given min-mod cuing in order to improve safety awareness and problem solving. -  Met   LONG TERM GOAL REVIEW:       1. Pt will recall 3-4 newly presented details with 80% accuracy given  min-mod cuing in order to improve memory skills. - Met       2. Pt will demonstrate improved sequencing by completing 3-4 step  functional tasks with >90% accuracy given min cuing. - Met       3. Pt will solve simple problems related to daily living with >90% accuracy  given min cuing in order to improve safety awareness and problem solving. - Met    PLAN  Speech Pathology Plan: Patient is recommended for home health therapy services.    Team Care Plan  Please review Integrated Patient View Care Plan Flowsheet for Team identified  Problems, Interventions, and Goals.    Identified problems from team documentation:  Problem: Impaired Cognition  Cognition: Primary Team Goal: Pt will answer orientation and biographical  questions with >90% accuracy given min cuing in order to improve memory./    Problem: Impaired Leisure Skills  Leisure Skills: Primary Team  Goal: Pt and caregiver will describe opportunities  and resources for social engagement in the community./    Problem: Impaired Mobility  Mobility: Primary Team Goal: Pt will correctly demonstrate safe sit<>stand  transfers and ambulation using FWW under direct SUPV or touch assist via  clinical team./Active    Problem: Impaired Pain Management  Pain Mgmt: Primary Team Goal: Maintain comfort level /Active    Problem: Impaired Self-care Mgmt/ADL/IADL  Self Care: Primary Team Goal: Pt will complete toilet transfers and all  associated tasks with supervision using AD as indicated to ensure safety./    Problem: Safety Risk and Restraint  Safety: Primary Team Goal: Pt. will recognize limitations and call for  assistance before attempting mobility 100% of the time in order to prevent fall  while in rehab/Active    Status update for discharge:     Cognition:   Primary Goal: Met  Discharge Status Comment:   Pt able to answer orientation/biographical questions  with >90% accuracy given min cuing.    3 Hour Rule Minutes: 60 minutes of SLP treatment this session count towards  intensity and duration of therapy requirement. Patient was seen for the full  scheduled time of SLP treatment this session.  Therapy Mode Minutes: Individual: 60 minutes.    Signed by: Mikael Spray, CCC/SLP 04/27/2020 11:00:00 AM

## 2020-04-28 LAB — GLUCOSE WHOLE BLOOD - POCT
Whole Blood Glucose POCT: 126 mg/dL — ABNORMAL HIGH (ref 70–100)
Whole Blood Glucose POCT: 200 mg/dL — ABNORMAL HIGH (ref 70–100)

## 2020-04-28 NOTE — Rehab Discharge Summary ([facility]) (Signed)
NAMESHEELAH Cox  MRN: [id number]  Account: [id number]  Session Start: 04/28/2020 12:00:00 AM  Session Stop: 04/28/2020 12:00:00 AM    Total Treatment Minutes:  Minutes    Inpatient Rehabilitation  Rehabilitation Nursing Discharge Summary    Rehab Diagnosis: Closed head injury after a fall, Mild anterior wedge  compression fracture of T7  Demographics:            Age: 77Y            Gender: Female  Primary Language: English    Date of Onset:  04/09/2020  Date of Admission: 04/11/2020 5:40:00 PM    Rehabilitation Precautions Restrictions:   Fall precaution, skin precaution, seizure precaution, HOH, impaired vision,  delayed mental processing    Discharge:  Patient discharged to:   Home  At discharge, the patient was discharged to live (with):  Family / Relatives.  Follow up providers include:    WEIGHT  Current Weight: 73.8 kilograms. Patient weighed using bed scale.    Patient Report: "am ok"  Patient/Caregiver Goals:  "to get better"    Wounds/Incisions: See Epic    Medication Review: No clinically significant medication issues identified this  shift.    Additional Education Provided:    Education Provided: Plan of care. Safety issues and interventions. Fall  protocol. Supervision requirements. Medication. Name and dosage. Administration.  Purpose. Side Effects. Interaction.       Audience: Patient.       Mode: Explanation.       Response: Verbalized understanding.    PLAN  Recommendations for Follow-Up Care:   Patient did not receive valuables because Will receive upon d/c  Bladder Program: Continent - needs assistance to walk to the bathroom  Bowel Program:  Last Bowel Movement- 04/24/20   Patient is continent  Skin: Keep intact skin clean and dry  Current Diet: CHO diet with thin liquids  Pain Management: Position changes. May take pain medication as ordered and as  needed n/a    Care Plan  Identified problems from team documentation:  Problem: Impaired Cognition  Cognition: Primary Team Goal: Pt will answer  orientation and biographical  questions with >90% accuracy given min cuing in order to improve memory./Met    Problem: Impaired Leisure Skills  Leisure Skills: Primary Team Goal: Pt and caregiver will describe opportunities  and resources for social engagement in the community./    Problem: Impaired Mobility  Mobility: Primary Team Goal: Pt will correctly demonstrate safe sit<>stand  transfers and ambulation using FWW under direct SUPV or touch assist via  clinical team./Met    Problem: Impaired Pain Management  Pain Mgmt: Primary Team Goal: Maintain comfort level /Active    Problem: Impaired Self-care Mgmt/ADL/IADL  Self Care: Primary Team Goal: Pt will complete toilet transfers and all  associated tasks with supervision using AD as indicated to ensure safety./Met    Problem: Safety Risk and Restraint  Safety: Primary Team Goal: Pt. will recognize limitations and call for  assistance before attempting mobility 100% of the time in order to prevent fall  while in rehab/Active    Status update for discharge:     Pain Management:   Primary Goal: Met  Discharge Status Comment:   Denied pain     Safety Risk:   Primary Goal: Met  Discharge Status Comment:   Patient calls for assistance     Self Care Management:   Primary Goal: Met  Discharge Status Comment:   Needs assistance to walk to the bathroom  to ensure  safety    Please review Integrated Patient View Care Plan Flowsheet for Team identified  Problems, Interventions, and Goals.    Signed by: Flo Shanks, RN 04/28/2020 5:20:00 AM

## 2020-04-28 NOTE — Progress Notes (Signed)
Called and spoke with Ren at [facility] to inform of discharge to [facility] today.    Larita Fife Caedin Mogan, RN, Administrator, Civil Service, Post Acute Care Coordinator  [facility]  [phone]

## 2020-04-28 NOTE — Progress Notes (Signed)
Discharge  Note:    The patient has discharge to ILF , her daughter [name] has transported her back to the facility.   CM put discharge information to the patients rehab binder.     Her daughter has hired caregiver support for the 24/7 caregiver support. [facility] nurse is on contact the [facility] for home health coordination.     No case management barriers to discharge.     Ms. Dallie Piles, MSW   Rehabilitation Case Manager  [facility] Inpatient Acute Rehab   [address]   [place], [place] [address]  [place] [phone]

## 2020-04-28 NOTE — Progress Notes (Signed)
Pt. discharged to ILF, transported by the daughter. MD assessed patient on the day of discharge. No complain of pain at the time of discharge. Discharge papers explained and given to daughter. Both patient and daughter verbalized understanding. All questions answered. Patient escorted the unit at around 1400 on wheelchair by staff.

## 2020-05-01 NOTE — Rehab Progress Note ([facility]) (Signed)
Yolanda Cox  MRN: [id number]  Account: [id number]  Session Start: 04/27/2020 12:00:00 AM  Session Stop: 04/27/2020 12:00:00 AM    Total Treatment Minutes:  Minutes    Case Management  Inpatient Rehabilitation Team Conference    Conference Date/Time: 04/27/2020 11:10:00 AM    Demographics            Age: 70Y            Gender: Female    Admission Date: 04/11/2020 5:40:00 PM  Diagnosis: Closed head injury after a fall, Mild anterior wedge compression  fracture of T7  Comorbidities:    VITAL SIGNS  Blood Pressure: 127/70 mmHg  Temperature:  degrees  Pulse:  beats per minute  Respirations:  breaths per minute  Pain: 4/10    WEIGHT and NUTRITION  Admission Weight:  pounds; Current Weight: pounds  Weight Change since Admit:  Food Consistency:  Liquid Consistency:    Plan Of Care  Anticipated Discharge Date/Estimated Length of Stay: 04/28/2020  Anticipated Discharge Destination: Community discharge with assistance  Discharge Plan : Home  Fall Risk Level: Yellow (Medium)  Medical Necessity Expected Level Rationale: medical management, vital signs, lab  monitoring  Intensity and Duration: an average of 3 hours/5 days per week  Medical Supervision and 24 Hour Rehab Nursing: x  Physical Therapy: x  PT Intensity/Duration: 60-120 min daily, 5-6 times per week  PT Number of days: 10  Occupational Therapy: x  OT Intensity/Duration: 60-120 min daily, 5-6 times per week  OT Number of days: 10  Speech and Language Therapy: x  SLP Intensity/Duration: 60-120 min daily, 5-6 times per week  SLP Number of days: 10  Therapeutic Recreation: x  Psychology: x  Registered Dietician: x  Case Management: Oretha Milch, CM  Nurse: Wyman Songster, RN  Occupational Therapy: Lorie Apley, OT  Physical Therapy: Juluis Pitch, PT  Physician: Gillis Santa, MD  Speech Language Pathology: Mikael Spray, SLP  Occupational Therapy Other: Margretta Sidle, OT reporting for [facility], OT  after thorough chart review    The following is a list of  patient problems that have been identified by the  interdisciplinary team:    Problem: Impaired Cognition  Cognition Status Update: Pt able to answer orientation/biographical questions  with >90% accuracy given min cuing.  Team Identified Barrier to Discharge: No  Interventions:  Decrease environmental stimuli: Active  Safety awareness training: Active  Compensatory strategies: Active  Cognition: Primary Team Goal/Status: Pt will answer orientation and biographical  questions with >90% accuracy given min cuing in order to improve memory. / Met    Problem: Impaired Leisure Skills  Leisure Skills: Primary Team Goal/Status: Pt and caregiver will describe  opportunities and resources for social engagement in the community. /    Problem: Impaired Mobility  Mobility Status Update: Pt grossly SUPV for bed mobility, SUPV for trasnfers  using FWW or RW, SUPV for ambulaiton using FWW, and CGA-Min A for curb  negotiation.  Interventions:  Transfer training: Active  Gait training: Active  Wheelchair propulsion and management: Active  Mobility: Primary Team Goal/Status: Pt will correctly demonstrate safe  sit<>stand transfers and ambulation using FWW under direct SUPV or touch assist  via clinical team. / Active    Problem: Impaired Pain Management  Pain Management Status Update: Effective pain management  Team Identified Barrier to Discharge: No  Interventions:  Medications as ordered: Active  Distraction and/or relaxation: Active  Positioning: Active  Pain Mgmt: Primary Team Goal/Status: Maintain comfort level  /  Active    Problem: Impaired Self-care Mgmt/ADL/IADL  Team Identified Barrier to Discharge: Yes  Interventions:  Adaptive equipment training: Active  Compensatory strategies: Active  Encourage patient to participate in activities of daily living: Active  Self Care: Primary Team Goal/Status: Pt will complete toilet transfers and all  associated tasks with supervision using AD as indicated to ensure safety. /    Problem:  Safety Risk and Restraint  Safety Risk Status Update: Patient is on Blue fall code - calls for help as  needed  Team Identified Barrier to Discharge: No  Interventions:  Identify patient at risk for falls: Active  Call light within reach: Active  Orient patient/family/caregiver to room and equipment: Active  Stay with patient in bathroom: Active  Bed alarm/wheelchair alarm: Active  Anticipate needs, including personal items within reach: Active  Other Safety Intervention 1 - Text: Patient reeducated about safety issues with  teach back.  Other Safety Intervention 1 - Status: Active  Safety: Primary Team Goal/Status: Pt. will recognize limitations and call for  assistance before attempting mobility 100% of the time in order to prevent fall  while in rehab / Active    Self Care Functional Status  Eating:  5 - Setup or clean-up assistance  Oral Hygiene:  3 - Partial/moderate assistance  Toileting:  4 - Supervision or touching assistance  Shower Bathe:  3 - Partial/moderate assistance  Upper Body Dressing:  4 - Supervision or touching assistance  Lower Body Dressing:  3 - Partial/moderate assistance  Putting On/Taking Off Footwear:  3 - Partial/moderate assistance    Mobility Functional Status  Roll Left and Right:  4 - Supervision or touching assistance  Sit to Lying:  4 - Supervision or touching assistance  Lying to Sitting on Side of Bed:  4 - Supervision or touching assistance  Sit to Stand:  4 - Supervision or touching assistance  Chair/Bed-to-Chair Transfer:  4 - Supervision or touching assistance  Toilet Transfer:  4 - Supervision or touching assistance  Car Transfer:  4 - Supervision or touching assistance  Walk 10 Feet:  4 - Supervision or touching assistance  Walk 50 Feet with Two Turns:  4 - Supervision or touching assistance  Walking 150 Feet:  4 - Supervision or touching assistance  Walking 10 Feet on Uneven Surface:  4 - Supervision or touching assistance  1 Step - Curb:  4 - Supervision or touching  assistance  4 Steps:  9 - Not applicable  12 Steps:  9 - Not applicable  Picking Up an Object:  4 - Supervision or touching assistance      Comments: None.    Signed by: Dallie Piles, MSW 04/27/2020 3:12:00 PM    Physician CoSigned By: Gillis Santa 05/01/2020 15:08:05

## 2020-05-02 NOTE — Rehab PPS CMG ([facility]) (Addendum)
Corrected 05/02/2020 11:50:18 AM    NAME: Yolanda Cox  MRN: [id number]  Account: [id number]  Session Start: 04/28/2020 12:00:00 AM  Session Stop: 04/28/2020 12:00:00 AM    Total Treatment Minutes:  Minutes    PPS CMG Coordinator  Inpatient Rehabilitation Discharge    Discharge Against Medical Advice:  No.  Discharge Information: Patient Discharged Alive:  Yes  Discharge Destination/Living Setting: Home with Home Health Services    Impairment Group: Brain Dysfunction: 02.1 Non-traumatic    Comorbidities:  Rank Code      Description    1    J69.0     Pneumonitis due to inhalation of food and vomit  2    I10.      Essential (primary) hypertension  3    E03.9     Hypothyroidism, unspecified  4    E78.5     Hyperlipidemia, unspecified  5    E11.65    Type 2 diabetes mellitus with hyperglycemia  6    M06.9     Rheumatoid arthritis, unspecified  7    M81.0     Age-related osteoporosis without current                 pathological fracture  8    S06.9X0S  Unspecified intracranial injury without loss of                 consciousness, sequela  9    D63.8     Anemia in other chronic diseases classified                 elsewhere  10   F32.A     Depression, unspecified  11   R47.1     Dysarthria and anarthria  12   G31.84    Mild cognitive impairment, so stated  13   M79.7     Fibromyalgia  14   S00.01XD  Abrasion of scalp, subsequent encounter  15   H91.90    Unspecified hearing loss, unspecified ear  16   E87.6     Hypokalemia  17   Z79.899   Other long term (current) drug therapy  18   Z86.73    Personal history of transient ischemic attack                 (TIA), and cerebral infarction without residual                 deficits  19   Z90.49    Acquired absence of other specified parts of                 digestive tract  20   Z87.891   Personal history of nicotine dependence  21   Z79.4     Long term (current) use of insulin  22   Z79.51    Long term (current) use of inhaled steroids  23   Z79.890   Hormone replacement  therapy  24   Z79.82    Long term (current) use of aspirin  25   N39.498   Other specified urinary incontinence    ********************************  Complications:  Rank Code      Description    1    E87.5     Hyperkalemia  2    N39.498   Other specified urinary incontinence    ********************************    MEDICAL NEEDS  Swallowing Status: Swallowing Status: Regular Food: solids and liquids swallowed  safely without supervision or modified food  consistencies.    QUALITY INDICATORS  Section GG: Functional Abilities  Self Care Discharge Performance                       Performance  GG0130. Self Care  Eating               6 - Independent  Oral hygiene         6 - Independent  Toileting hygiene    4 - Supervision or touching assistance  Shower/bathe self    4 - Supervision or touching assistance  Upper body dressing  4 - Supervision or touching assistance  Lower body dressing  4 - Supervision or touching assistance  Footwear on/off      5 - Setup or clean-up assistance    Mobility Discharge Performance                       Performance  GG0170. Mobility  Roll left/right      4 - Supervision or touching assistance  Sit to lying         4 - Supervision or touching assistance  Lying to sitting bed 4 - Supervision or touching assistance  Sit to stand         4 - Supervision or touching assistance  Chair/bed transfer   4 - Supervision or touching assistance  Toilet transfer      4 - Supervision or touching assistance  Car transfer         4 - Supervision or touching assistance  Walk 10 feet         4 - Supervision or touching assistance  Walk 50 ft 2 turns   4 - Supervision or touching assistance  Walk 150 feet        4 - Supervision or touching assistance  10 ft uneven surface 4 - Supervision or touching assistance  1 step (curb)        4 - Supervision or touching assistance  4 steps              9 - Not applicable  12 steps             9 - Not applicable  Picking up object    4 - Supervision or touching  assistance  Use wheelchair?      No    Section M. Skin Conditions Discharge  Unhealed Pressure Ulcer(s)/Injurie(s) at Stage 1 or Higher:  No  Current Number of Unhealed Pressure Ulcers    Number of Unhealed Stage 1 Pressure Injuries: 0  Number of Unhealed Stage 2: 0  Number of Unhealed Stage 3: 0  Number of Unhealed Stage 4: 0  Number of Unhealed Unstageable Due to Non-removable Dressing/Device: 0  Number of Unhealed Unstageable Due to Slough/Eschar: 0  Number of Unhealed Unstageable Injuries Presenting as Deep Tissue Injury: 0  Worsening in Pressure Ulcer Status Since Admission    Number of Worsening Stage 2: 0  Number of Worsening Stage 3: 0  Number of Worsening Stage 4: 0  Number of Worsening Unstageable Due to Non-removable Dressing: 0  Number of Worsening Unstageable Due to Slough/Eschar: 0  Number of Worsening Unstageable Due to Suspected Deep Tissue Injury: 0  Healed Pressure Ulcer(s)    Number of Healed Stage 1: 0  Number of Healed Stage 2: 0  Number of Healed Stage 3: 0  Number of Healed Stage 4: 0    Health Conditions:  Fall(s) Since Admission:  No    Section N. Medication    Medication Intervention: N/A - There were no potential clinically significant  medication issues identified since admission or patient is not taking any  medications.    Signed by: Kendra Opitz, PPS Coordinator 04/28/2020 5:00:00 PM

## 2021-02-11 ENCOUNTER — Inpatient Hospital Stay
Admission: EM | Admit: 2021-02-11 | Discharge: 2021-02-15 | DRG: 083 | Disposition: A | Discharge status: Skilled Nursing Facility | Payer: Medicare Other | Attending: Student in an Organized Health Care Education/Training Program | Admitting: Student in an Organized Health Care Education/Training Program

## 2021-02-11 DIAGNOSIS — R0902 Hypoxemia: Secondary | ICD-10-CM | POA: Diagnosis present

## 2021-02-11 DIAGNOSIS — Z79890 Hormone replacement therapy (postmenopausal): Secondary | ICD-10-CM

## 2021-02-11 DIAGNOSIS — F419 Anxiety disorder, unspecified: Secondary | ICD-10-CM

## 2021-02-11 DIAGNOSIS — R0689 Other abnormalities of breathing: Secondary | ICD-10-CM

## 2021-02-11 DIAGNOSIS — S0101XA Laceration without foreign body of scalp, initial encounter: Secondary | ICD-10-CM | POA: Diagnosis present

## 2021-02-11 DIAGNOSIS — Z20822 Contact with and (suspected) exposure to covid-19: Secondary | ICD-10-CM | POA: Diagnosis present

## 2021-02-11 DIAGNOSIS — Z87891 Personal history of nicotine dependence: Secondary | ICD-10-CM

## 2021-02-11 DIAGNOSIS — D649 Anemia, unspecified: Secondary | ICD-10-CM | POA: Diagnosis present

## 2021-02-11 DIAGNOSIS — E119 Type 2 diabetes mellitus without complications: Secondary | ICD-10-CM | POA: Diagnosis present

## 2021-02-11 DIAGNOSIS — S0003XA Contusion of scalp, initial encounter: Secondary | ICD-10-CM

## 2021-02-11 DIAGNOSIS — S060XAA Concussion with loss of consciousness status unknown, initial encounter: Secondary | ICD-10-CM

## 2021-02-11 DIAGNOSIS — M810 Age-related osteoporosis without current pathological fracture: Secondary | ICD-10-CM | POA: Diagnosis present

## 2021-02-11 DIAGNOSIS — E039 Hypothyroidism, unspecified: Secondary | ICD-10-CM | POA: Diagnosis present

## 2021-02-11 DIAGNOSIS — M069 Rheumatoid arthritis, unspecified: Secondary | ICD-10-CM | POA: Diagnosis present

## 2021-02-11 DIAGNOSIS — D72829 Elevated white blood cell count, unspecified: Secondary | ICD-10-CM

## 2021-02-11 DIAGNOSIS — S066X9A Traumatic subarachnoid hemorrhage with loss of consciousness of unspecified duration, initial encounter: Principal | ICD-10-CM | POA: Diagnosis present

## 2021-02-11 DIAGNOSIS — E7849 Other hyperlipidemia: Secondary | ICD-10-CM

## 2021-02-11 DIAGNOSIS — Z881 Allergy status to other antibiotic agents status: Secondary | ICD-10-CM

## 2021-02-11 DIAGNOSIS — Z8673 Personal history of transient ischemic attack (TIA), and cerebral infarction without residual deficits: Secondary | ICD-10-CM

## 2021-02-11 DIAGNOSIS — I4891 Unspecified atrial fibrillation: Secondary | ICD-10-CM | POA: Diagnosis present

## 2021-02-11 DIAGNOSIS — Z79899 Other long term (current) drug therapy: Secondary | ICD-10-CM

## 2021-02-11 DIAGNOSIS — E785 Hyperlipidemia, unspecified: Secondary | ICD-10-CM | POA: Diagnosis present

## 2021-02-11 DIAGNOSIS — I609 Nontraumatic subarachnoid hemorrhage, unspecified: Secondary | ICD-10-CM | POA: Diagnosis present

## 2021-02-11 DIAGNOSIS — R402243 Coma scale, best verbal response, confused conversation, at hospital admission: Secondary | ICD-10-CM | POA: Diagnosis present

## 2021-02-11 DIAGNOSIS — R402363 Coma scale, best motor response, obeys commands, at hospital admission: Secondary | ICD-10-CM | POA: Diagnosis present

## 2021-02-11 DIAGNOSIS — F0394 Unspecified dementia, unspecified severity, with anxiety: Secondary | ICD-10-CM | POA: Diagnosis present

## 2021-02-11 DIAGNOSIS — B961 Klebsiella pneumoniae [K. pneumoniae] as the cause of diseases classified elsewhere: Secondary | ICD-10-CM | POA: Diagnosis present

## 2021-02-11 DIAGNOSIS — J9811 Atelectasis: Secondary | ICD-10-CM | POA: Diagnosis present

## 2021-02-11 DIAGNOSIS — Z7982 Long term (current) use of aspirin: Secondary | ICD-10-CM

## 2021-02-11 DIAGNOSIS — I10 Essential (primary) hypertension: Secondary | ICD-10-CM | POA: Diagnosis present

## 2021-02-11 DIAGNOSIS — B9689 Other specified bacterial agents as the cause of diseases classified elsewhere: Secondary | ICD-10-CM

## 2021-02-11 DIAGNOSIS — R402143 Coma scale, eyes open, spontaneous, at hospital admission: Secondary | ICD-10-CM | POA: Diagnosis present

## 2021-02-11 DIAGNOSIS — N390 Urinary tract infection, site not specified: Secondary | ICD-10-CM | POA: Diagnosis present

## 2021-02-11 DIAGNOSIS — W19XXXA Unspecified fall, initial encounter: Secondary | ICD-10-CM | POA: Diagnosis present

## 2021-02-11 DIAGNOSIS — E079 Disorder of thyroid, unspecified: Secondary | ICD-10-CM | POA: Diagnosis present

## 2021-02-11 DIAGNOSIS — Z66 Do not resuscitate: Secondary | ICD-10-CM | POA: Diagnosis present

## 2021-02-11 DIAGNOSIS — Z9049 Acquired absence of other specified parts of digestive tract: Secondary | ICD-10-CM

## 2021-02-11 DIAGNOSIS — M797 Fibromyalgia: Secondary | ICD-10-CM | POA: Diagnosis present

## 2021-02-11 DIAGNOSIS — D464 Refractory anemia, unspecified: Secondary | ICD-10-CM

## 2021-02-12 ENCOUNTER — Inpatient Hospital Stay: Payer: Medicare Other

## 2021-02-12 ENCOUNTER — Emergency Department: Payer: Self-pay

## 2021-02-12 ENCOUNTER — Emergency Department: Payer: Medicare Other

## 2021-02-12 DIAGNOSIS — R079 Chest pain, unspecified: Secondary | ICD-10-CM

## 2021-02-12 DIAGNOSIS — I609 Nontraumatic subarachnoid hemorrhage, unspecified: Secondary | ICD-10-CM | POA: Diagnosis present

## 2021-02-12 DIAGNOSIS — R569 Unspecified convulsions: Secondary | ICD-10-CM

## 2021-02-12 DIAGNOSIS — E079 Disorder of thyroid, unspecified: Secondary | ICD-10-CM | POA: Diagnosis present

## 2021-02-12 DIAGNOSIS — S0101XA Laceration without foreign body of scalp, initial encounter: Secondary | ICD-10-CM

## 2021-02-12 DIAGNOSIS — W19XXXA Unspecified fall, initial encounter: Secondary | ICD-10-CM

## 2021-02-12 LAB — PT/INR
PT INR: 1.2 — ABNORMAL HIGH (ref 0.9–1.1)
PT: 14.2 s — ABNORMAL HIGH (ref 10.1–12.9)

## 2021-02-12 LAB — CBC AND DIFFERENTIAL
Absolute NRBC: 0.03 10*3/uL — ABNORMAL HIGH (ref 0.00–0.00)
Absolute NRBC: 0 10*3/uL (ref 0.00–0.00)
Hematocrit: 34.1 % — ABNORMAL LOW (ref 34.7–43.7)
Hematocrit: 26.7 % — ABNORMAL LOW (ref 34.7–43.7)
Hgb: 10.8 g/dL — ABNORMAL LOW (ref 11.4–14.8)
Hgb: 8.6 g/dL — ABNORMAL LOW (ref 11.4–14.8)
MCH: 31.5 pg (ref 25.1–33.5)
MCH: 32.2 pg (ref 25.1–33.5)
MCHC: 31.7 g/dL (ref 31.5–35.8)
MCHC: 32.2 g/dL (ref 31.5–35.8)
MCV: 99.4 fL — ABNORMAL HIGH (ref 78.0–96.0)
MCV: 100 fL — ABNORMAL HIGH (ref 78.0–96.0)
MPV: 11.6 fL (ref 8.9–12.5)
MPV: 11.3 fL (ref 8.9–12.5)
Nucleated RBC: 0.2 /100 WBC — ABNORMAL HIGH (ref 0.0–0.0)
Nucleated RBC: 0 /100 WBC (ref 0.0–0.0)
Platelets: 191 10*3/uL (ref 142–346)
Platelets: 174 10*3/uL (ref 142–346)
RBC: 3.43 10*6/uL — ABNORMAL LOW (ref 3.90–5.10)
RBC: 2.67 10*6/uL — ABNORMAL LOW (ref 3.90–5.10)
RDW: 17 % — ABNORMAL HIGH (ref 11–15)
RDW: 17 % — ABNORMAL HIGH (ref 11–15)
WBC: 19.31 10*3/uL — ABNORMAL HIGH (ref 3.10–9.50)
WBC: 48.59 10*3/uL — ABNORMAL HIGH (ref 3.10–9.50)

## 2021-02-12 LAB — MAN DIFF ONLY
Band Neutrophils Absolute: 0.19 10*3/uL (ref 0.00–1.00)
Band Neutrophils Absolute: 1.46 10*3/uL — ABNORMAL HIGH (ref 0.00–1.00)
Band Neutrophils: 1 %
Band Neutrophils: 3 %
Basophils Absolute Manual: 0 10*3/uL (ref 0.00–0.08)
Basophils Absolute Manual: 0 10*3/uL (ref 0.00–0.08)
Basophils Manual: 0 %
Basophils Manual: 0 %
Eosinophils Absolute Manual: 0 10*3/uL (ref 0.00–0.44)
Eosinophils Absolute Manual: 0 10*3/uL (ref 0.00–0.44)
Eosinophils Manual: 0 %
Eosinophils Manual: 0 %
Lymphocytes Absolute Manual: 0.77 10*3/uL (ref 0.42–3.22)
Lymphocytes Absolute Manual: 0.97 10*3/uL (ref 0.42–3.22)
Lymphocytes Manual: 4 %
Lymphocytes Manual: 2 %
Metamyelocytes Absolute: 0.39 10*3/uL — ABNORMAL HIGH
Metamyelocytes Absolute: 0.49 10*3/uL — ABNORMAL HIGH
Metamyelocytes: 2 %
Metamyelocytes: 1 %
Monocytes Absolute: 2.32 10*3/uL — ABNORMAL HIGH (ref 0.21–0.85)
Monocytes Absolute: 4.86 10*3/uL — ABNORMAL HIGH (ref 0.21–0.85)
Monocytes Manual: 12 %
Monocytes Manual: 10 %
Myelocytes Absolute: 0.49 10*3/uL — ABNORMAL HIGH
Myelocytes: 1 %
Neutrophils Absolute Manual: 15.64 10*3/uL — ABNORMAL HIGH (ref 1.10–6.33)
Neutrophils Absolute Manual: 40.33 10*3/uL — ABNORMAL HIGH (ref 1.10–6.33)
Segmented Neutrophils: 81 %
Segmented Neutrophils: 83 %

## 2021-02-12 LAB — MAGNESIUM
Magnesium: 1.8 mg/dL (ref 1.6–2.6)
Magnesium: 2 mg/dL (ref 1.6–2.6)
Magnesium: 2.2 mg/dL (ref 1.6–2.6)

## 2021-02-12 LAB — ECG 12-LEAD
Atrial Rate: 83 {beats}/min
Q-T Interval: 352 ms
QRS Duration: 96 ms
QTC Calculation (Bezet): 491 ms
R Axis: 125 degrees
T Axis: -66 degrees
Ventricular Rate: 117 {beats}/min

## 2021-02-12 LAB — COMPREHENSIVE METABOLIC PANEL
ALT: 32 U/L (ref 0–55)
AST (SGOT): 42 U/L — ABNORMAL HIGH (ref 5–41)
Albumin/Globulin Ratio: 1.3 (ref 0.9–2.2)
Albumin: 4.5 g/dL (ref 3.5–5.0)
Alkaline Phosphatase: 72 U/L (ref 37–117)
Anion Gap: 13 (ref 5.0–15.0)
BUN: 15 mg/dL (ref 7.0–21.0)
Bilirubin, Total: 2.7 mg/dL — ABNORMAL HIGH (ref 0.2–1.2)
CO2: 21 mEq/L (ref 17–29)
Calcium: 10 mg/dL (ref 7.9–10.2)
Chloride: 101 mEq/L (ref 99–111)
Creatinine: 0.9 mg/dL (ref 0.4–1.0)
Globulin: 3.5 g/dL (ref 2.0–3.6)
Glucose: 374 mg/dL — ABNORMAL HIGH (ref 70–100)
Potassium: 4 mEq/L (ref 3.5–5.3)
Protein, Total: 8 g/dL (ref 6.0–8.3)
Sodium: 135 mEq/L (ref 135–145)

## 2021-02-12 LAB — COVID-19 (SARS-COV-2) & INFLUENZA  A/B, NAA (ROCHE LIAT)
Influenza A: NOT DETECTED
Influenza B: NOT DETECTED
SARS CoV 2 Overall Result: NOT DETECTED

## 2021-02-12 LAB — CELL MORPHOLOGY
Cell Morphology: NORMAL
Cell Morphology: ABNORMAL — AB
Platelet Estimate: NORMAL
Platelet Estimate: NORMAL

## 2021-02-12 LAB — GLUCOSE WHOLE BLOOD - POCT
Whole Blood Glucose POCT: 316 mg/dL — ABNORMAL HIGH (ref 70–100)
Whole Blood Glucose POCT: 255 mg/dL — ABNORMAL HIGH (ref 70–100)
Whole Blood Glucose POCT: 258 mg/dL — ABNORMAL HIGH (ref 70–100)
Whole Blood Glucose POCT: 189 mg/dL — ABNORMAL HIGH (ref 70–100)

## 2021-02-12 LAB — CBC
Absolute NRBC: 0.05 10*3/uL — ABNORMAL HIGH (ref 0.00–0.00)
Hematocrit: 31.1 % — ABNORMAL LOW (ref 34.7–43.7)
Hgb: 9.8 g/dL — ABNORMAL LOW (ref 11.4–14.8)
MCH: 31.2 pg (ref 25.1–33.5)
MCHC: 31.5 g/dL (ref 31.5–35.8)
MCV: 99 fL — ABNORMAL HIGH (ref 78.0–96.0)
MPV: 10.8 fL (ref 8.9–12.5)
Nucleated RBC: 0.1 /100 WBC — ABNORMAL HIGH (ref 0.0–0.0)
Platelets: 177 10*3/uL (ref 142–346)
RBC: 3.14 10*6/uL — ABNORMAL LOW (ref 3.90–5.10)
RDW: 17 % — ABNORMAL HIGH (ref 11–15)
WBC: 52.33 10*3/uL — ABNORMAL HIGH (ref 3.10–9.50)

## 2021-02-12 LAB — BASIC METABOLIC PANEL
Anion Gap: 14 (ref 5.0–15.0)
Anion Gap: 7 (ref 5.0–15.0)
BUN: 16 mg/dL (ref 7.0–21.0)
BUN: 17 mg/dL (ref 7.0–21.0)
CO2: 19 mEq/L (ref 17–29)
CO2: 23 mEq/L (ref 17–29)
Calcium: 9.4 mg/dL (ref 7.9–10.2)
Calcium: 9 mg/dL (ref 7.9–10.2)
Chloride: 103 mEq/L (ref 99–111)
Chloride: 103 mEq/L (ref 99–111)
Creatinine: 0.8 mg/dL (ref 0.4–1.0)
Creatinine: 0.8 mg/dL (ref 0.4–1.0)
Glucose: 355 mg/dL — ABNORMAL HIGH (ref 70–100)
Glucose: 262 mg/dL — ABNORMAL HIGH (ref 70–100)
Potassium: 3.8 mEq/L (ref 3.5–5.3)
Potassium: 4 mEq/L (ref 3.5–5.3)
Sodium: 136 mEq/L (ref 135–145)
Sodium: 133 mEq/L — ABNORMAL LOW (ref 135–145)

## 2021-02-12 LAB — URINALYSIS REFLEX TO MICROSCOPIC EXAM - REFLEX TO CULTURE
Glucose, UA: >=1000 — AB
Ketones UA: 40 — AB
Nitrite, UA: NEGATIVE
Protein, UR: 100 — AB
Specific Gravity UA: 1.025 (ref 1.001–1.035)
Urine pH: 6 (ref 5.0–8.0)
Urobilinogen, UA: 1 mg/dL (ref 0.2–2.0)

## 2021-02-12 LAB — PHOSPHORUS: Phosphorus: 3.2 mg/dL (ref 2.3–4.7)

## 2021-02-12 LAB — GFR
EGFR: 58.9
EGFR: >60
EGFR: >60

## 2021-02-12 LAB — HEMOGLOBIN A1C
Average Estimated Glucose: 171.4 mg/dL
Hemoglobin A1C: 7.6 % — ABNORMAL HIGH (ref 4.6–5.9)

## 2021-02-12 LAB — LACTIC ACID, PLASMA
Lactic Acid: 2 mmol/L (ref 0.2–2.0)
Lactic Acid: 1.8 mmol/L (ref 0.2–2.0)

## 2021-02-12 LAB — CK: Creatine Kinase (CK): 126 U/L (ref 29–233)

## 2021-02-12 MED ORDER — DEXTROSE 10 % IV BOLUS
25.0000 g | INTRAVENOUS | Status: DC | PRN
Start: 2021-02-12 — End: 2021-02-15

## 2021-02-12 MED ORDER — LEVOTHYROXINE SODIUM 100 MCG/5ML IV SOLN
37.5000 ug | Freq: Every day | INTRAVENOUS | Status: DC
Start: 2021-02-12 — End: 2021-02-12
  Administered 2021-02-12: 37.5 ug via INTRAVENOUS
  Filled 2021-02-12 (×3): qty 5

## 2021-02-12 MED ORDER — INSULIN LISPRO 100 UNIT/ML SOLN (WRAP)
1.0000 [IU] | Status: DC
Start: 2021-02-12 — End: 2021-02-12
  Administered 2021-02-12: 4 [IU] via SUBCUTANEOUS
  Filled 2021-02-12: qty 12

## 2021-02-12 MED ORDER — SODIUM CHLORIDE 0.9 % IV SOLN
INTRAVENOUS | Status: DC
Start: 2021-02-12 — End: 2021-02-13

## 2021-02-12 MED ORDER — RISAQUAD PO CAPS
1.0000 | ORAL_CAPSULE | Freq: Every day | ORAL | Status: DC
Start: 2021-02-12 — End: 2021-02-15
  Administered 2021-02-12 – 2021-02-15 (×4): 1 via ORAL
  Filled 2021-02-12 (×4): qty 1

## 2021-02-12 MED ORDER — SODIUM CHLORIDE 0.9 % IV SOLN
500.0000 mg | Freq: Two times a day (BID) | INTRAVENOUS | Status: DC
Start: 2021-02-12 — End: 2021-02-12
  Administered 2021-02-12: 500 mg via INTRAVENOUS
  Filled 2021-02-12 (×2): qty 5

## 2021-02-12 MED ORDER — SODIUM PHOSPHATES 3 MMOLE/ML IV SOLN (WRAP)
25.0000 mmol | INTRAVENOUS | Status: DC | PRN
Start: 2021-02-12 — End: 2021-02-13
  Administered 2021-02-13: 25 mmol via INTRAVENOUS
  Filled 2021-02-12: qty 8.33

## 2021-02-12 MED ORDER — LIDOCAINE 5 % EX PTCH
1.0000 | MEDICATED_PATCH | CUTANEOUS | Status: DC
Start: 2021-02-12 — End: 2021-02-15
  Administered 2021-02-12 – 2021-02-13 (×2): 1 via TRANSDERMAL
  Filled 2021-02-12 (×3): qty 1

## 2021-02-12 MED ORDER — METHOCARBAMOL 500 MG PO TABS
500.0000 mg | ORAL_TABLET | Freq: Four times a day (QID) | ORAL | Status: DC
Start: 2021-02-12 — End: 2021-02-15
  Administered 2021-02-12 – 2021-02-15 (×12): 500 mg via ORAL
  Filled 2021-02-12 (×13): qty 1

## 2021-02-12 MED ORDER — MAGNESIUM OXIDE 400 MG TABS (WRAP)
400.0000 mg | ORAL_TABLET | ORAL | Status: DC | PRN
Start: 2021-02-12 — End: 2021-02-13
  Administered 2021-02-13: 400 mg via ORAL
  Filled 2021-02-12: qty 1

## 2021-02-12 MED ORDER — DEXTROSE 50 % IV SOLN
25.0000 g | INTRAVENOUS | Status: DC | PRN
Start: 2021-02-12 — End: 2021-02-15

## 2021-02-12 MED ORDER — SENNOSIDES-DOCUSATE SODIUM 8.6-50 MG PO TABS
1.0000 | ORAL_TABLET | Freq: Two times a day (BID) | ORAL | Status: DC
Start: 2021-02-12 — End: 2021-02-15
  Administered 2021-02-12 – 2021-02-15 (×6): 1 via ORAL
  Filled 2021-02-12 (×6): qty 1

## 2021-02-12 MED ORDER — ONDANSETRON HCL 4 MG/2ML IJ SOLN
4.0000 mg | Freq: Once | INTRAMUSCULAR | Status: AC
Start: 2021-02-12 — End: 2021-02-12
  Administered 2021-02-12: 4 mg via INTRAVENOUS
  Filled 2021-02-12: qty 2

## 2021-02-12 MED ORDER — ACETAMINOPHEN 325 MG PO TABS
650.0000 mg | ORAL_TABLET | Freq: Four times a day (QID) | ORAL | Status: DC | PRN
Start: 2021-02-12 — End: 2021-02-13

## 2021-02-12 MED ORDER — MAGNESIUM SULFATE IN D5W 1-5 GM/100ML-% IV SOLN
1.0000 g | INTRAVENOUS | Status: DC | PRN
Start: 2021-02-12 — End: 2021-02-13
  Administered 2021-02-12 (×2): 1 g via INTRAVENOUS
  Filled 2021-02-12 (×2): qty 100

## 2021-02-12 MED ORDER — SODIUM PHOSPHATES 3 MMOLE/ML IV SOLN (WRAP)
35.0000 mmol | INTRAVENOUS | Status: DC | PRN
Start: 2021-02-12 — End: 2021-02-13

## 2021-02-12 MED ORDER — MORPHINE SULFATE 2 MG/ML IJ/IV SOLN (WRAP)
2.0000 mg | Status: DC | PRN
Start: 2021-02-12 — End: 2021-02-12

## 2021-02-12 MED ORDER — ATORVASTATIN CALCIUM 20 MG PO TABS
40.0000 mg | ORAL_TABLET | Freq: Every evening | ORAL | Status: DC
Start: 2021-02-12 — End: 2021-02-15
  Administered 2021-02-12 – 2021-02-14 (×3): 40 mg via ORAL
  Filled 2021-02-12 (×3): qty 2

## 2021-02-12 MED ORDER — INSULIN LISPRO 100 UNIT/ML SOLN (WRAP)
1.0000 [IU] | Freq: Three times a day (TID) | Status: DC
Start: 2021-02-12 — End: 2021-02-15
  Administered 2021-02-12 (×2): 5 [IU] via SUBCUTANEOUS
  Administered 2021-02-13: 1 [IU] via SUBCUTANEOUS
  Administered 2021-02-13: 5 [IU] via SUBCUTANEOUS
  Administered 2021-02-13: 3 [IU] via SUBCUTANEOUS
  Administered 2021-02-14: 5 [IU] via SUBCUTANEOUS
  Administered 2021-02-15: 3 [IU] via SUBCUTANEOUS
  Filled 2021-02-12: qty 3
  Filled 2021-02-12 (×3): qty 15
  Filled 2021-02-12 (×2): qty 9
  Filled 2021-02-12: qty 15

## 2021-02-12 MED ORDER — HEPARIN SODIUM (PORCINE) 5000 UNIT/ML IJ SOLN
5000.0000 [IU] | Freq: Three times a day (TID) | INTRAMUSCULAR | Status: DC
Start: 2021-02-13 — End: 2021-02-13
  Administered 2021-02-13: 5000 [IU] via SUBCUTANEOUS
  Filled 2021-02-12: qty 1

## 2021-02-12 MED ORDER — SODIUM CHLORIDE 0.9 % IV MBP
1.0000 g | INTRAVENOUS | Status: DC
Start: 2021-02-12 — End: 2021-02-14
  Administered 2021-02-12 – 2021-02-14 (×3): 1 g via INTRAVENOUS
  Filled 2021-02-12 (×3): qty 1000

## 2021-02-12 MED ORDER — LEVETIRACETAM 500 MG PO TABS
500.0000 mg | ORAL_TABLET | Freq: Two times a day (BID) | ORAL | Status: DC
Start: 2021-02-12 — End: 2021-02-15
  Administered 2021-02-12 – 2021-02-15 (×6): 500 mg via ORAL
  Filled 2021-02-12 (×6): qty 1

## 2021-02-12 MED ORDER — DEXTROSE 5% IV BOLUS
250.0000 mL | INTRAVENOUS | Status: DC | PRN
Start: 2021-02-12 — End: 2021-02-15

## 2021-02-12 MED ORDER — CALCIUM CITRATE-VITAMIN D 315-6.25 MG-MCG PO TABS
1.0000 | ORAL_TABLET | Freq: Every day | ORAL | Status: DC
Start: 2021-02-12 — End: 2021-02-15
  Administered 2021-02-12 – 2021-02-15 (×4): 1 via ORAL
  Filled 2021-02-12 (×4): qty 1

## 2021-02-12 MED ORDER — POTASSIUM CHLORIDE CRYS ER 20 MEQ PO TBCR
0.0000 meq | EXTENDED_RELEASE_TABLET | ORAL | Status: DC | PRN
Start: 2021-02-12 — End: 2021-02-13
  Administered 2021-02-13: 40 meq via ORAL
  Filled 2021-02-12: qty 2

## 2021-02-12 MED ORDER — SODIUM CHLORIDE 0.9 % IV SOLN
500.0000 mg | Freq: Once | INTRAVENOUS | Status: AC
Start: 2021-02-12 — End: 2021-02-12
  Administered 2021-02-12: 500 mg via INTRAVENOUS
  Filled 2021-02-12: qty 5

## 2021-02-12 MED ORDER — LEVOTHYROXINE SODIUM 75 MCG PO TABS
75.0000 ug | ORAL_TABLET | Freq: Every day | ORAL | Status: DC
Start: 2021-02-12 — End: 2021-02-15
  Administered 2021-02-12 – 2021-02-15 (×4): 75 ug via ORAL
  Filled 2021-02-12 (×4): qty 1

## 2021-02-12 MED ORDER — POTASSIUM CHLORIDE 10 MEQ/100ML IV SOLN (WRAP)
10.0000 meq | INTRAVENOUS | Status: DC | PRN
Start: 2021-02-12 — End: 2021-02-13
  Administered 2021-02-12 (×2): 10 meq via INTRAVENOUS
  Filled 2021-02-12 (×2): qty 100

## 2021-02-12 MED ORDER — INSULIN LISPRO 100 UNIT/ML SOLN (WRAP)
1.0000 [IU] | Freq: Every evening | Status: DC
Start: 2021-02-12 — End: 2021-02-15
  Administered 2021-02-12: 22:00:00 1 [IU] via SUBCUTANEOUS
  Administered 2021-02-13 – 2021-02-14 (×2): 2 [IU] via SUBCUTANEOUS
  Filled 2021-02-12 (×2): qty 6
  Filled 2021-02-12: qty 3

## 2021-02-12 MED ORDER — SODIUM PHOSPHATES 3 MMOLE/ML IV SOLN (WRAP)
15.0000 mmol | INTRAVENOUS | Status: DC | PRN
Start: 2021-02-12 — End: 2021-02-13

## 2021-02-12 MED ORDER — POTASSIUM CHLORIDE 20 MEQ PO PACK
0.0000 meq | PACK | ORAL | Status: DC | PRN
Start: 2021-02-12 — End: 2021-02-13

## 2021-02-12 MED ORDER — SERTRALINE HCL 50 MG PO TABS
50.0000 mg | ORAL_TABLET | Freq: Every day | ORAL | Status: DC
Start: 2021-02-12 — End: 2021-02-15
  Administered 2021-02-12 – 2021-02-15 (×4): 50 mg via ORAL
  Filled 2021-02-12 (×4): qty 1

## 2021-02-12 MED ORDER — GLUCAGON 1 MG IJ SOLR (WRAP)
1.0000 mg | INTRAMUSCULAR | Status: DC | PRN
Start: 2021-02-12 — End: 2021-02-15

## 2021-02-12 MED ORDER — SODIUM CHLORIDE 0.9 % IV SOLN
INTRAVENOUS | Status: DC
Start: 2021-02-12 — End: 2021-02-12

## 2021-02-12 NOTE — PT Eval Note (Signed)
Physical Therapy Evaluation  Patient: Yolanda Cox    ST03/ST03.A  Discharge Recommendations:   Based on today's session: Discharge Recommendation: SNF     If Discharge Recommendation: SNF is not available, then the patient will need home health services, 24/7 supervision, assistance with mobility, assistance with ADL's, and assistance with IADL's  If pt returns home, DME Recommended for Discharge: No additional equipment/DME recommended at this time      Assessment:   Yolanda Cox is a 87 y.o. female admitted 02/11/2021.  Pt's functional mobility is impacted by:  decreased activity tolerance, decreased balance, decreased bed mobility, level of consciousness, gait impairment, decreased safety awareness, decreased strength, and transfers .  There are a few comorbidities or other factors that affect plan of care and require modification of task including: assistive device needed for mobility, frequent falls, and lives alone.  Standardized tests and exams incorporated into evaluation include AMPAC mobility, ROM , and Strength.  Pt demonstrates a evolving clinical presentation due to continued hospital level care required.   Pt would continue to benefit from PT to address these deficits and increase functional independence.     Complexity Level Hx and Co  morbidites Examination Clinical Decision Making Clinical Presentation   Low no impact 1-2 elements Limited options Stable   Moderate   1-2 factors 3 or more   Several options Evolving, plan may alter   High 3 or more 4 or more Multiple options Unstable, unpredictable       Impairments: Assessment: Decreased UE strength;Decreased LE strength;Decreased safety/judgement during functional mobility;Decreased cognition;Decreased endurance/activity tolerance;Decreased functional mobility;Decreased balance;Gait impairment.     Therapy Diagnosis: generalized weakness, decreased functional mobility , decreased independence with ADL's, increased gait  dysfunction, and decreased endurance/ activity engagement. Without therapy interventions, patient is at risk for falls, dependence on caregivers for mobility, dependence on caregivers for ADL's, decreased independence, and failure to return to PLOF.    Rehabilitation Potential: Prognosis: Good;With continued PT status post acute discharge      Plan:    Treatment/Interventions: Exercise;Gait training;Neuromuscular re-education;Functional transfer training;LE strengthening/ROM;Endurance training;Bed mobility PT Frequency: 3-4x/wk    Risks/Benefits/POC Discussed with Pt/Family: With patient/family          Goals:   Goals  Goal Formulation: With patient/family  Time for Goal Acheivement: By time of discharge  Goals: Select goal  Pt Will Go Supine To Sit: with supervision;to maximize functional mobility and independence;7 visits;Not met  Pt Will Perform Sit to Stand: with contact guard assist;to maximize functional mobility and independence;7 visits;Not met  Pt Will Transfer Bed/Chair: with rolling walker;with contact guard assist;to maximize functional mobility and independence;7 visits;Not met  Pt Will Ambulate: 31-50 feet;with rolling walker;with minimal assist;to maximize functional mobility and independence;7 visits;Not met      [facility] SURGICAL TRAUMA INTENSIVE CARE ST03/ST03.A    Time of treatment: Time Calculation  PT Received On: 02/12/21  Start Time: 1035  Stop Time: 1100  Time Calculation (min): 25 min      PT Visit Number: 1    Consult received for Yolanda Cox for PT Evaluation and Treatment.  Patients medical condition is appropriate for Physical therapy intervention at this time.    Precautions and Contraindications:   Precautions  Weight Bearing Status: no restrictions  Fall Risks: High;Impaired balance/gait;Impaired mobility    Medical Diagnosis: SAH (subarachnoid hemorrhage) [I60.9]    History of Present Illness: Yolanda Cox is a 87 y.o. female admitted on 02/11/2021  s/p mechanical fall resulting  in a right frontal SAH    Patient Active Problem List   Diagnosis    Fall, initial encounter    Seizure after head injury    SAH (subarachnoid hemorrhage)    Disorder of thyroid        Past Medical/Surgical History:  Past Medical History:   Diagnosis Date    Anemia     Fibromyalgia     Hyperlipidemia     hypothyroid     Osteoporosis     Rheumatoid     TIA (transient ischemic attack)       Past Surgical History:   Procedure Laterality Date    CHOLECYSTECTOMY           X-Rays/Tests/Labs:  CT Head WO Contrast    Result Date: 02/12/2021   1.  Small amount of traumatic type subarachnoid hemorrhage is noted in a sulcus overlying the right frontal convexity. 2.  There is no hydrocephalus, herniation, midline shift or other significant mass effect. 3.   A left parietal scalp injury is noted without underlying fracture. 4.   Critical findings were discussed with, and read back by, Dr. Randa Lynn at the following time: 02/12/2021 12:10 AM. Miguel Dibble, MD  02/12/2021 12:15 AM    CT Cervical Spine without Contrast    Result Date: 02/12/2021   No acute fracture. Theador Hawthorne, MD  02/12/2021 12:13 AM    XR Chest  AP Portable    Result Date: 02/12/2021  No acute abnormality. Theador Hawthorne, MD  02/12/2021 12:30 AM    Pelvis Portable    Result Date: 02/12/2021  1.  No definite acute abnormality. No fracture or dislocation is seen. 2.   If the patient is unable to bear weight, CT is recommended for further evaluation. 3.   Degenerative changes are noted in the spine and hips. Miguel Dibble, MD  02/12/2021 12:31 AM       Social History:  Prior Level of Function  Prior level of function: Independent with ADLs, Ambulates with assistive device  Assistive Device: Four wheel walker  Baseline Activity Level: Community ambulation  Cooking: light meal prep  DME Currently at Home: Environmental consultant, [employer]  Home Living Arrangements  Living Arrangements: Alone (has a caregiver 5-6 days per week for 3-4 hours, her daughter will stop by on  days when she does not have assistance to make her breakfast, pt goes to one of the on campus restaurants for dinner daily)  Type of Home: Apartment [employer] [employer])  Home Layout: One level, Occupational psychologist Shower/Tub: Pension scheme manager: Raised  Bathroom Equipment: Grab bars in shower, Built-in shower seat, Grab bars around toilet, Hand-held shower  Bathroom Accessibility: Accessible via walker  DME Currently at Home: Environmental consultant, [employer]  Home Living - Notes / Comments: Pt receives supervision for showering.      Subjective:    Patient is agreeable to participation in the therapy session. Nursing clears patient for therapy. "No pain."   Patient Goal  Patient Goal: to rest    Pain Assessment  Pain Assessment: No/denies pain              Objective:   Observation of Patient/Vital Signs:  Patient is in bed with telemetry, SCD's, peripheral IV, O2  via nasal cannula, and female external catheter in place.         Cognition/Neuro Status  Arousal/Alertness: Delayed responses to stimuli  Attention Span: Attends to task with redirection  Orientation Level: Oriented X4  Following Commands: Follows one step commands with increased time;Follows one step commands with repetition (due to lethargy and HOH)  Safety Awareness: minimal verbal instruction  Comments: very lethargic, pts daughter reports that she typically sleeps 12 hours and pt was found on the ground overnight and has not yet had any restful sleep  Behavior: calm;cooperative (lethargic)      Hearing: impaired and wears hearing aide  Vision: WNL  Sensation: WNL    Gross ROM  Right Upper Extremity ROM: within functional limits  Left Upper Extremity ROM: within functional limits  Right Lower Extremity ROM: within functional limits  Left Lower Extremity ROM: within functional limits  Gross Strength  Right Upper Extremity Strength: 4-/5  Left Upper Extremity Strength: 4-/5  Right Lower Extremity Strength: 4-/5  Left Lower Extremity Strength: 4-/5        Functional Mobility  Supine to Sit: Maximal Assist;Increased Time;Increased Effort;HOB raised  Scooting to EOB: Moderate Assist  Sit to Stand: Moderate Assist;with instruction for hand placement to increase safety  Stand to Sit: Minimal Assist  Transfers  Bed to Chair: Moderate Assist  Device Used for Functional Transfer: front-wheeled walker  Locomotion  Ambulation: Minimal Assist;with front-wheeled walker  Pattern: Step through;decreased step length;decreased cadence;L foot decreased clearance;R foot decreased clearance  Distance Walked (ft) (Step 6,7): 2 Feet  PMP Activity: Step 5 - Chair     Balance  Sitting - Static: Fair  Sitting - Dynamic: Fair  Standing - Static: Fair  Standing - Dynamic: Poor    Participation and Endurance  Participation Effort: fair  Endurance: Tolerates 10 - 20 min exercise with multiple rests         AM-PAC Inpatient Short Forms  Inpatient AM-PAC Performed? (PT): Basic Mobility Inpatient Short Form  AM-PAC "6 Clicks" Basic Mobility Inpatient Short Form  Turning Over in Bed: A little  Sitting Down On/Standing From Armchair: A lot  Lying on Back to Sitting on Side of Bed: A lot  Assist Moving to/from Bed to Chair: A lot  Assist to Walk in Hospital Room: A lot  Assist to Climb 3-5 Steps with Railing: Total  PT Basic Mobility Raw Score: 12  CMS 0-100% Score: 68.66%      Educated the patient to role of physical therapy, plan of care, goals of therapy and safety with mobility and ADLs.      Therapist PPE during session procedural mask and gloves     Richrd Humbles, PT, DPT

## 2021-02-12 NOTE — ED Notes (Signed)
Dr Randa Lynn speaking with daughter at bedside. Pt no intubation, no CPR. Pt's medical alarm given to daughter.

## 2021-02-12 NOTE — ED Provider Notes (Incomplete)
Nursing (triage) note reviewed for the following pertinent information:  fall, head injury, no blood thinners      History     Chief Complaint   Patient presents with    Fall    Head Injury     HPI     87 yo F with h/o HLD, RA, TIA, cognitive deficits, NPH? Seizures?     Past Medical History:   Diagnosis Date    Anemia     Fibromyalgia     Hyperlipidemia     hypothyroid     Osteoporosis     Rheumatoid     TIA (transient ischemic attack)        Past Surgical History:   Procedure Laterality Date    CHOLECYSTECTOMY         No family history on file.    Social  Social History     Tobacco Use    Smoking status: Former   Substance Use Topics    Alcohol use: Not Currently    Drug use: Never       .     Allergies   Allergen Reactions    Biaxin [Clarithromycin] Rash    Zithromax [Azithromycin] Rash       Home Medications               acetaminophen (TYLENOL) 325 MG tablet     Take 2 tablets (650 mg total) by mouth every 6 (six) hours as needed for Pain     amLODIPine (NORVASC) 10 MG tablet     Take 1 tablet (10 mg total) by mouth daily     aspirin 81 MG chewable tablet     Chew 1 tablet (81 mg total) by mouth daily     atorvastatin (LIPITOR) 40 MG tablet     Take 1 tablet (40 mg total) by mouth nightly     calcium carbonate (OS-CAL) 600 MG Tab tablet     Take 600 mg by mouth daily     folic acid (FOLVITE) 1 MG tablet     Take 1 mg by mouth daily     levETIRAcetam (KEPPRA) 500 MG tablet     Take 1 tablet (500 mg total) by mouth every 12 (twelve) hours     levothyroxine (SYNTHROID) 75 MCG tablet     Take 1 tablet (75 mcg total) by mouth Once a day at 6:00am     methotrexate 2.5 MG tablet     Take by mouth once a week     Multiple Vitamin (multivitamin) capsule     Take 1 capsule by mouth daily     Omega-3 Fatty Acids (OMEGA-3 FISH OIL PO)     Take 1,000 mg by mouth daily     ondansetron (ZOFRAN-ODT) 4 MG disintegrating tablet     Take 1 tablet (4 mg total) by mouth every 8 (eight) hours as needed for Nausea      sertraline (ZOLOFT) 50 MG tablet     Take 1 tablet (50 mg total) by mouth daily     vitamin D (cholecalciferol) 25 MCG (1000 UT) tablet     Take 25 mcg by mouth daily             Review of Systems    Physical Exam    BP: 144/67, Heart Rate: (!) 108, Temp: (!) 96.8 F (36 C), Resp Rate: 16, SpO2: 97 %, Weight: 65.2 kg    Physical Exam      MDM  and ED Course     ED Medication Orders (From admission, onward)      Start Ordered     Status Ordering Provider    02/12/21 0020 02/12/21 0019  levETIRAcetam (KEPPRA) 500 mg in sodium chloride 0.9 % 100 mL IVPB  Once        Route: Intravenous  Ordered Dose: 500 mg     Ordered Ambur [place] A    02/12/21 0016 02/12/21 0015  ondansetron (ZOFRAN) injection 4 mg  Once        Route: Intravenous  Ordered Dose: 4 mg     Acknowledged Francis Gaines A               MDM        ED Course as of 02/12/21 0432   Mon Feb 12, 2021   0022 Spoke with radiology pt has small SAH and hematoma she is alert following commands GCS 4 nauseated protecting her airway, will speak with trauma and NSGY is not anticoagulated, found down in her [employer] apartment in the kitchen. She can not provide history, is following commands moving all extremities [BL]   0029 Spoke with NSGY over night q1 neuro checks keepra 500mg  bid,hob 30 degrees, if not expanding no surgery  [BL]      ED Course User Index  [BL] Nicanor Alcon, MD             Procedures    Clinical Impression & Disposition     Clinical Impression  Final diagnoses:   None        ED Disposition       None             New Prescriptions    No medications on file

## 2021-02-12 NOTE — ED Notes (Signed)
Pt log rolled to remove soiled clothing, vomiting when turned. Ok to remove c-collar Per Dr Randa Lynn at bedside. Second PIV placed. Pt tolerated well.

## 2021-02-12 NOTE — Plan of Care (Signed)
WBC 52.33 today  CXR clear  UA + for UTI. Ucx pending. Rocephin started  Bcx ordered

## 2021-02-12 NOTE — Plan of Care (Signed)
Neurosurgery    Called regarding CT head findings. Demonstrates small R frontal traumatic SAH. Patient had a fall today and was found on the ground. Reportedly GCS 14 and moving all extremities. Takes a baby ASA. Discussed with ER physician Dr. Randa Lynn for patient to be admitted to ICU overnight with q1h neuro checks. Keppra 500mg  BID. HOB > 30. SBP < 150.     Patient was not physically seen by me. Will have formal consult in am.     Call with questions or neuro decline.    Linna Caprice, PA-C  02/12/21 12:50 AM

## 2021-02-12 NOTE — Consults (Signed)
NEUROSURGERY CONSULTATION    Date Time: 02/12/21 6:58 AM  Patient Name: Yolanda Cox  Requesting Physician: Frutoso Schatz, MD  Consulting Physician: Dr. Ardelle Park   Covered By:  [facility] PA: Pager [phone] Spect[employer] [phone]      Reason for Consultation:   tSAH    Assessment:   87 y.o. female w/ Hx anemia, fibromyalgia, HLD, hypothyroid, RA and TIA p/w AMS and head laceration after unwitnessed fall and found down. Head CT revealed small right frontal SAH without significant mass effect. On exam she is GCS 14 mildly confused but non focal neurologically. Stable overnight.       Plan:     Medical management per trauma  Ok to downgrade to Q 4 hour neuro checks  Daughter requested no heroic measures including surgical intervention as patient is DNR/DNI  No indication for neurosurgical intervention   Keppra   Ok for chemical DVT ppx 11/15 am   STAT HCT for neuro exam decline  Outpatient follow up as needed   Please call with questions      History:     Chief Complaint   Patient presents with    Fall    Head Injury       Yolanda Cox is a 87 y.o. right handed female who presents to the hospital on 02/11/2021 w/ Hx anemia, fibromyalgia, HLD, hypothyroid, RA and TIA p/w AMS and head laceration after unwitnessed fall and found down. Patient admits to falling but is unable to proved any further history.       Past Medical History:     Past Medical History:   Diagnosis Date    Anemia     Fibromyalgia     Hyperlipidemia     hypothyroid     Osteoporosis     Rheumatoid     TIA (transient ischemic attack)        Past Surgical History:     Past Surgical History:   Procedure Laterality Date    CHOLECYSTECTOMY         Family History:     I reviewed the past family history myself. Family history is non-contributory and there is no family history pertinent to this encounter.    Social History:     Social History     Socioeconomic History    Marital status: Widowed     Spouse name: Not on file    Number of children:  Not on file    Years of education: Not on file    Highest education level: Not on file   Occupational History    Not on file   Tobacco Use    Smoking status: Former    Smokeless tobacco: Never   Vaping Use    Vaping Use: Never used   Substance and Sexual Activity    Alcohol use: Not Currently    Drug use: Never    Sexual activity: Not on file   Other Topics Concern    Not on file   Social History Narrative    Not on file     Social Determinants of Health     Financial Resource Strain: Not on file   Food Insecurity: Not on file   Transportation Needs: Not on file   Physical Activity: Not on file   Stress: Not on file   Social Connections: Not on file   Intimate Partner Violence: Not on file   Housing Stability: Not on file       Allergies:  Allergies   Allergen Reactions    Biaxin [Clarithromycin] Rash    Zithromax [Azithromycin] Rash       Medications:     Current Facility-Administered Medications   Medication Dose Route Frequency    levETIRAcetam  500 mg Intravenous Q12H [facility]    levothyroxine  37.5 mcg Intravenous Daily at 0600    senna-docusate  1 tablet Oral Q12H [facility]       Reviewed in chart.    Review of Systems:     UTA due to confusion     Physical Exam:     Vitals:    02/12/21 0600   BP: 120/52   Pulse: 75   Resp: 19   Temp:    SpO2: 95%       General: No acute distress, cooperative with examination  Psychologic: UTA due to confusion   Skin: Warm, dry, no obvious lesions or scars  Eyes: Sclerae anicteric, no conjunctival injection  ENT: No otorrhea, no rhinorrhea, trachea midline  Head: Normocephalic  Neck: No palpable masses  Musculoskeletal: Full ROM, normal muscle tone, no atrophy  Pulmonary: Normal respiratory effort, no audible wheezing  Cardiovascular: No pedal edema, pulses 2+ in bilat lower extremities  Abdominal: Non-tender to palpation, non-distended, no organomegaly, no palpable masses    Mental Status: The patient is awake, alert and oriented to person and place but not time.  Flat  affect.  Recent and remote memory are impaired  Attention span and concentration appear short  Language function is normal. There is no evidence of aphasia in conversational speech.    Cranial Nerves:  -CN II: Visual fields full to bedside confrontation  -CN III, IV, VI: Pupils equal, round, and reactive to light 3 mm; extraocular movements intact; no ptosis                                         -CN V: Facial sensation intact in V1 through V3 distributions  -CN VII: Face symmetric  -CN VIII: Hearing impaired in B ears (chronic)  -CN IX, X: Palate elevates symmetrically; normal phonation  -CN XI: Symmetric full strength of sternocleidomastoid and trapezius muscles  -CN XII: Tongue protrudes midline     Motor: Muscle tone normal without spasticity or flaccidity. No atrophy.    Pronator drift UTA due to patient's inability to follow                     UE's:       Deltoid Bicep Tricep Grip IO    Right 5 5 5 5 5     Left 5 5 5 5 5         LE's:       HF KE DF PF EHL    Right 5 5 5 5 5     Left 5 5 5 5 5           Sensory:  Light touch intact throughout     Reflexes:       Biceps Triceps BR Patellar Achilles    Right 2+ 2+ 2+ 2+ 2+    Left 2+ 2+ 2+ 2+ 2+       No Hoffmann's sign bilaterally  No Clonus bilaterally     Coordination: No tremors.     Gait: Not tested secondary to patient condition.     GCS 14     Labs Reviewed:  Lab Results   Component Value Date    WBC 52.33 (H) 02/12/2021    HGB 9.8 (L) 02/12/2021    HCT 31.1 (L) 02/12/2021    MCV 99.0 (H) 02/12/2021    PLT 177 02/12/2021     Lab Results   Component Value Date    NA 136 02/12/2021    K 3.8 02/12/2021    CL 103 02/12/2021    CO2 19 02/12/2021     Lab Results   Component Value Date    INR 1.2 (H) 02/12/2021    INR 1.1 04/08/2020    PT 14.2 (H) 02/12/2021    PT 13.4 (H) 04/08/2020       Rads:   All neuro imaging personally reviewed by myself.    Radiology Results (24 Hour)       Procedure Component Value Units Date/Time    Pelvis Portable [[id number]]  Collected: 02/12/21 0030    Order Status: Completed Updated: 02/12/21 0033    Narrative:      HISTORY: fall elderly. Injury of the pelvis.    COMPARISON: None available.    FINDINGS:      The soft tissues appear unremarkable.    No fracture or dislocation is identified. Degenerative changes are noted  in the spine and hips.      Impression:          1.  No definite acute abnormality. No fracture or dislocation is seen.   2.   If the patient is unable to bear weight, CT is recommended for  further evaluation.   3.   Degenerative changes are noted in the spine and hips.         Miguel Dibble, MD   02/12/2021 12:31 AM    XR Chest  AP Portable [[id number]] Collected: 02/12/21 0028    Order Status: Completed Updated: 02/12/21 0032    Narrative:      HISTORY: Fall    COMPARISON: Chest x-ray from 04/21/2020.    FINDINGS:     LUNGS: No consolidation or edema.    PLEURA: No pleural effusions or pneumothorax.    HEART AND MEDIASTINUM:  No cardiac enlargement. There is atherosclerotic  calcification of the thoracic aorta. A cardiac monitoring device  projects to the left of the cardiac silhouette.    BONES:  Suboptimally evaluated. There is a compression fracture at T8  which appears similar as compared to exam from 04/21/2020.      Impression:          No acute abnormality.    Theador Hawthorne, MD   02/12/2021 12:30 AM    CT Head WO Contrast [[id number]] Collected: 02/12/21 0008    Order Status: Completed Updated: 02/12/21 0017    Narrative:      HISTORY:  Injury with pain. head injury.    COMPARISON: CT brain from: 04/21/2020.    TECHNIQUE: CT of the head performed without intravenous contrast. The  following dose reduction techniques were utilized: automated exposure  control and/or adjustment of the mA and/or KV according to patient size,  and the use of an iterative reconstruction technique.    FINDINGS: A left parietal scalp injury is noted without underlying  fracture. The scalp and calvarium are otherwise unremarkable. No soft  tissue  gas or foreign body is seen. No drainable extra-axial fluid  collections are identified. A small amount of subarachnoid hemorrhage is  seen in a sulcus overlying the paramedian right frontal convexity. No  subdural hematoma is identified. There  is no midline shift or other mass  effect. There is no hydrocephalus. No territorial infarct is seen. The  skull base, middle ear structures, and mastoid air cells are  unremarkable.    The visualized facial structures show no significant findings. There is  no sinus air-fluid level.       Impression:           1.  Small amount of traumatic type subarachnoid hemorrhage is noted in a  sulcus overlying the right frontal convexity.  2.  There is no hydrocephalus, herniation, midline shift or other  significant mass effect.   3.   A left parietal scalp injury is noted without underlying fracture.  4.   Critical findings were discussed with, and read back by, Dr. Randa Lynn  at the following time: 02/12/2021 12:10 AM.        Miguel Dibble, MD   02/12/2021 12:15 AM    CT Cervical Spine without Contrast [[id number]] Collected: 02/12/21 0008    Order Status: Completed Updated: 02/12/21 0015    Narrative:      HISTORY: Injury of the cervical spine.    COMPARISON: Cervical spine CT from 04/08/2020    TECHNIQUE: CT of the cervical spine performed without intravenous  contrast. The following dose reduction techniques were utilized:  automated exposure control and/or adjustment of the mA and/or KV  according to patient size, and the use of an iterative reconstruction  technique.    FINDINGS:     There is no acute fracture or traumatic listhesis. Vertebral body  heights are maintained. There is generalized osteopenia. There is  moderate to severe degenerative disease most prevalent at C5-C6 and  C6-C7 with intervertebral disc space narrowing, endplate degeneration,  disc calcification, and endplate osteophyte formation. There is moderate  to severe left facet arthropathy most pronounced at C3 C4,  C4-C5, C5 C6  and moderate facet arthropathy on the right most pronounced at C4-C5.    No prevertebral soft tissue swelling. There is biapical  pleural-parenchymal scarring.    No significant stenosis of the spinal canal or neural foramina.      Impression:           No acute fracture.    Theador Hawthorne, MD   02/12/2021 12:13 AM            Signed by: Latoyia Tecson M Bhavin Monjaraz, PA-C  Date/Time: 02/12/21 6:58 AM

## 2021-02-12 NOTE — H&P (Signed)
SURGICAL CRITICAL CARE HISTORY AND PHYSICAL     Date Time: 02/12/21 2:10 AM  Patient Name: Yolanda Cox  Attending Physician: Frutoso Schatz, MD  Primary Care Physician: Shelbie Proctor, MD  Type of Admission: Inpatient  DATE OF ADMISSION:   02/11/2021 11:56 PM  CHIEF COMPLAINT:     Chief Complaint   Patient presents with    Fall    Head Injury      CONSULTS:   Neurosurgery-Dr Ardelle Park Time: ED  ASSESSMENT / PLAN   The patient has the following active problems:  Patient Active Problem List   Diagnosis    Fall, initial encounter    Seizure after head injury    SAH (subarachnoid hemorrhage)    Disorder of thyroid       #Neuro:  GCS  Altered Mental Status  Seizues (specify type, cause, chronicity)  Intracranial Hemorrhage  Small amount of traumatic type subarachnoid hemorrhage is noted in a sulcus overlying the right frontal convexity.  SAH  Every hour neurochecks  Keppra loading dose; and continue Keppra twice daily  Multimodal pain management    #Resp:  RA  IS  Supplemental oxygen to maintain SPO2 greater than 96%    #Cardio:  Monitor    #GI:  Antiemetics  PPI    #Infectious Disease (ID):  No issues at present           #Hem/Onc:  No issues at present    #Renal/Fluid, Electrolytes :  MIVF  Trending sodiums  A.m. labs treat and follow accordingly    #Endo:    DM II  Hypothyroid  Levothyroxine converted to IV while n.p.o.  SSI    #Vascular:  No acute issues at present    #Musculoskeletal:  No acute issues at present    #Integumentary:  A left parietal scalp injury is noted without underlying fracture.    #Nutrition:  No orders of the defined types were placed in this encounter.    NPO    #Lines:   PIVs    #Prophylaxis:   DVT prophylaxis: SCDs and contraindication due to active bleeding or high risk of bleeding   GI prophylaxis: proton pump inhibitor per orders      HPI:   Yolanda Cox is a 87 y.o. female who  has a past medical history of Anemia, Fibromyalgia, Hyperlipidemia,  hypothyroid, Osteoporosis, Rheumatoid, and TIA (transient ischemic attack)., presents to the ER for evaluation.  Patient was reportedly found down at her semiindependent living at [facility] [place] [facility].  It is unknown for how long she was incapacitated.  EMS reports approximately 300 mils of blood on the floor close to where the patient was lying when they arrived.  Patient was slow to answer questions however has been able to follow commands even though she is somewhat lethargic.  Patient arrived in a c-collar, with a pressure bandage applied to the scalp wound and hemostasis had been achieved.  Imaging and labs were obtained at this time.  Neurosurgery was contacted with results.  They requested patient be admitted to STICU for every hour neurochecks, Keppra, and repeat CT imaging if deterioration in condition.  ALLERGIES:     Allergies   Allergen Reactions    Biaxin [Clarithromycin] Rash    Zithromax [Azithromycin] Rash     MEDICATIONS:   (Not in a hospital admission)    PAST MEDICAL HISTORY:     Past Medical History:   Diagnosis Date    Anemia     Fibromyalgia  Hyperlipidemia     hypothyroid     Osteoporosis     Rheumatoid     TIA (transient ischemic attack)      PAST SURGICAL HISTORY:     Past Surgical History:   Procedure Laterality Date    CHOLECYSTECTOMY       FAMILY HISTORY:   History reviewed. No pertinent family history.  SOCIAL HISTORY:     Social History     Socioeconomic History    Marital status: Widowed     Spouse name: Not on file    Number of children: Not on file    Years of education: Not on file    Highest education level: Not on file   Occupational History    Not on file   Tobacco Use    Smoking status: Former    Smokeless tobacco: Never   Vaping Use    Vaping Use: Never used   Substance and Sexual Activity    Alcohol use: Not Currently    Drug use: Never    Sexual activity: Not on file   Other Topics Concern    Not on file   Social History Narrative    Not on file     Social Determinants of Health      Financial Resource Strain: Not on file   Food Insecurity: Not on file   Transportation Needs: Not on file   Physical Activity: Not on file   Stress: Not on file   Social Connections: Not on file   Intimate Partner Violence: Not on file   Housing Stability: Not on file     REVIEW OF SYSTEMS:   Pertinent positives as per HPI above  All other systems reviewed and are negative    PHYSICAL EXAM:     Vitals:    02/12/21 0002 02/12/21 0100 02/12/21 0110 02/12/21 0115   BP: 144/67  122/51 122/53   Pulse: (!) 108 80 82 83   Resp: 16 15 19 16    Temp: (!) 96.8 F (36 C) 97.9 F (36.6 C)     TempSrc: Temporal Temporal     SpO2: 97% 93% 91% 92%   Weight: 65.2 kg (143 lb 11.8 oz)      Height: 1.626 m (5\' 4" )        Vent Settings:     Intake and Output Summary (Last 24 hours) at Date Time    Intake/Output Summary (Last 24 hours) at 02/12/2021 0210  Last data filed at 02/12/2021 0100  Gross per 24 hour   Intake 100 ml   Output --   Net 100 ml       Physical Exam  Vitals and nursing note reviewed.   Constitutional:       Appearance: She is normal weight. She is ill-appearing.   HENT:      Head: Normocephalic.      Comments: Scalp lac     Mouth/Throat:      Mouth: Mucous membranes are moist.      Pharynx: Oropharynx is clear.   Eyes:      Extraocular Movements: Extraocular movements intact.      Pupils: Pupils are equal, round, and reactive to light.   Cardiovascular:      Rate and Rhythm: Normal rate and regular rhythm.      Pulses: Normal pulses.   Pulmonary:      Effort: Pulmonary effort is normal.      Breath sounds: Normal breath sounds.   Abdominal:  General: Bowel sounds are normal.      Palpations: Abdomen is soft.   Musculoskeletal:         General: Normal range of motion.      Cervical back: Normal range of motion and neck supple.   Skin:     General: Skin is warm and dry.      Capillary Refill: Capillary refill takes less than 2 seconds.   Neurological:      Comments: dementia       Patient Lines/Drains/Airways  Status       Active PICC Line / CVC Line / PIV Line / Drain / Airway / Intraosseous Line / Epidural Line / ART Line / Line / Wound / Pressure Ulcer / NG/OG Tube       Name Placement date Placement time Site Days    Peripheral IV 02/12/21 18 G Standard Right Antecubital 02/12/21  0022  Antecubital  less than 1    Peripheral IV 02/12/21 20 G Diffusion Left Antecubital 02/12/21  0038  Antecubital  less than 1                    LABS:   ETOH: not available  Results       Procedure Component Value Units Date/Time    CBC and differential [[id number]]  (Abnormal) Collected: 02/12/21 0021    Specimen: Blood Updated: 02/12/21 0118     WBC 19.31 x10 3/uL      Hgb 10.8 g/dL      Hematocrit 54.0 %      Platelets 191 x10 3/uL      RBC 3.43 x10 6/uL      MCV 99.4 fL      MCH 31.5 pg      MCHC 31.7 g/dL      RDW 17 %      MPV 11.6 fL      Nucleated RBC 0.2 /100 WBC      Absolute NRBC 0.03 x10 3/uL     Manual Differential [[id number]]  (Abnormal) Collected: 02/12/21 0021     Updated: 02/12/21 0118     Segmented Neutrophils 81 %      Band Neutrophils 1 %      Lymphocytes Manual 4 %      Monocytes Manual 12 %      Eosinophils Manual 0 %      Basophils Manual 0 %      Metamyelocytes 2 %      Neutrophils Absolute Manual 15.64 x10 3/uL      Band Neutrophils Absolute 0.19 x10 3/uL      Lymphocytes Absolute Manual 0.77 x10 3/uL      Monocytes Absolute 2.32 x10 3/uL      Eosinophils Absolute Manual 0.00 x10 3/uL      Basophils Absolute Manual 0.00 x10 3/uL      Metamyelocytes Absolute 0.39 x10 3/uL     Cell MorpHology [[id number]] Collected: 02/12/21 0021     Updated: 02/12/21 0118     Cell Morphology Normal     Platelet Estimate Normal    COVID-19 (SARS-CoV-2) and Influenza A/B, NAA (Liat Rapid)- Age 48 and above [[id number]] Collected: 02/12/21 0021    Specimen: Culturette from Nasopharyngeal Updated: 02/12/21 0057     Purpose of COVID testing Diagnostic -[facility]I     SARS-CoV-2 Specimen Source Nasal Swab     SARS CoV 2 Overall Result Not Detected      Influenza A Not Detected  Influenza B Not Detected    Narrative:      o Collect and clearly label specimen type:  o PREFERRED-Upper respiratory specimen: One Nasal Swab in  Transport Media.  o Hand deliver to laboratory ASAP  Diagnostic -[facility]I    Prothrombin time/INR [[id number]]  (Abnormal) Collected: 02/12/21 0021    Specimen: Blood Updated: 02/12/21 0054     PT 14.2 sec      PT INR 1.2    Comprehensive metabolic panel [[id number]]  (Abnormal) Collected: 02/12/21 0021    Specimen: Blood Updated: 02/12/21 0048     Glucose 374 mg/dL      BUN 41.3 mg/dL      Creatinine 0.9 mg/dL      Sodium 244 mEq/L      Potassium 4.0 mEq/L      Chloride 101 mEq/L      CO2 21 mEq/L      Calcium 10.0 mg/dL      Protein, Total 8.0 g/dL      Albumin 4.5 g/dL      AST (SGOT) 42 U/L      ALT 32 U/L      Alkaline Phosphatase 72 U/L      Bilirubin, Total 2.7 mg/dL      Globulin 3.5 g/dL      Albumin/Globulin Ratio 1.3     Anion Gap 13.0    Magnesium [[id number]] Collected: 02/12/21 0021    Specimen: Blood Updated: 02/12/21 0048     Magnesium 2.0 mg/dL     GFR [[id number]] Collected: 02/12/21 0021     Updated: 02/12/21 0048     EGFR 58.9             RADS:   Radiological Procedure personally  reviewed by this surgeon    CT Head WO Contrast    Result Date: 02/12/2021   1.  Small amount of traumatic type subarachnoid hemorrhage is noted in a sulcus overlying the right frontal convexity. 2.  There is no hydrocephalus, herniation, midline shift or other significant mass effect. 3.   A left parietal scalp injury is noted without underlying fracture. 4.   Critical findings were discussed with, and read back by, Dr. Randa Lynn at the following time: 02/12/2021 12:10 AM. Miguel Dibble, MD  02/12/2021 12:15 AM    CT Cervical Spine without Contrast    Result Date: 02/12/2021   No acute fracture. Theador Hawthorne, MD  02/12/2021 12:13 AM    XR Chest  AP Portable    Result Date: 02/12/2021  No acute abnormality. Theador Hawthorne, MD  02/12/2021 12:30 AM    Pelvis Portable    Result  Date: 02/12/2021  1.  No definite acute abnormality. No fracture or dislocation is seen. 2.   If the patient is unable to bear weight, CT is recommended for further evaluation. 3.   Degenerative changes are noted in the spine and hips. Miguel Dibble, MD  02/12/2021 12:31 AM     EKG Results       Procedure Component Value Units Date/Time    ECG 12 lead [[id number]] Collected: 02/12/21 0008     Updated: 02/12/21 0014     Ventricular Rate 117 BPM      Atrial Rate 83 BPM      P-R Interval -- ms      QRS Duration 96 ms      Q-T Interval 352 ms      QTC Calculation (Bezet) 491 ms      P Axis -- degrees  R Axis 125 degrees      T Axis -66 degrees      IHS MUSE NARRATIVE AND IMPRESSION --     ATRIAL FIBRILLATION WITH RAPID VENTRICULAR RESPONSE  LATERAL MYOCARDIAL INFARCTION , AGE UNDETERMINED  ST & T WAVE ABNORMALITY, CONSIDER INFERIOR ISCHEMIA  ABNORMAL ECG  NO PREVIOUS ECGS AVAILABLE      Narrative:      ATRIAL FIBRILLATION WITH RAPID VENTRICULAR RESPONSE  LATERAL MYOCARDIAL INFARCTION , AGE UNDETERMINED  ST & T WAVE ABNORMALITY, CONSIDER INFERIOR ISCHEMIA  ABNORMAL ECG  NO PREVIOUS ECGS AVAILABLE            I have personally reviewed the patients history and 24 hour interval events, along with vitals, labs, radiology images and nursing notes, as well as the medical record from the previous hospitalizations, and outside records available. So far today I have spent 60 minutes providing care for this patient excluding teaching and billable procedures, and not overlapping with any other providers with greater than 50% of the time in counseling or coordination of care. .    See above number of minutes of critical care time, excluding procedures, with high imminent risk for threat to life or limb with the following systems at risk:  Subarachnoid hemorrhage Diabetes Mellitus   Activities of critical care included:  Consults, Discussion with patient/family, Review of prior records, Review of results in the ED, Bedside evaluation  and direction of care, High risk medication administration or other resuscitation, and Close observation and re-evaluations    Critical care time was exclusive of separately billable procedures and treating other patients and teaching time.   Critical care was time spent personally by me on one or more of the following activities: discussions with consultants, development of treatment plan with patient or surrogate, evaluation of patient's response to treatment, examination of patient, obtaining history from patient or surrogate, ordering and performing treatments and interventions, ordering and review of laboratory studies, ordering and review of radiographic studies, pulse oximetry, re-evaluation of patient's condition and review of old charts.   At least one organ system is acutely impaired.   There is a high probability of imminent, life-threatening deterioration.   I intervened to try to prevent further deterioration of the patient's condition.   SIGNED BY:     Sharene Skeans, ACNP-BC  Nurse Practitioner  Trauma/Critical Care  IL[facility]  [phone]      *This note was generated by the Epic EMR system/ Dragon speech recognition and may contain inherent errors or omissions not intended by the user. Grammatical errors, random word insertions, deletions, pronoun errors and incomplete sentences are occasional consequences of this technology due to software limitations. Not all errors are caught or corrected. If there are questions or concerns about the content of this note or information contained within the body of this dictation they should be addressed directly with the author for clarification    *Due to pandemic of coronavirus and based on hospital policies and procedures ; this document may contain, but not limited to, information based on direct patient contact, observation , telephone conversation, use of A/V devices , medical records, conversation with other treatment team members, information and physical exam by  other treatment team members.  Not all errors are caught or corrected. If there are questions or concerns about the content of this note or information contained within the body of this document they should be addressed directly with the author for clarification.

## 2021-02-12 NOTE — Progress Notes (Signed)
Initial Case Management Assessment and Discharge Planning  [facility]   Patient Name: ZADAYA, CUADRA   Date of Birth 20-Jun-1929   Attending Physician: Frutoso Schatz, MD   Primary Care Physician: Shelbie Proctor, MD   Length of Stay 0   Reason for Consult / Chief Complaint 87 yo F admitted s/p fall. Resident of [facility]        Situation   Admission DX:   1. SAH (subarachnoid hemorrhage)        A/O Status: Partially    Patient admitted from: ER  Admission Status: inpatient    Health Care Agent: Self  Name / phone number:      Background     Advanced directive:   <no information>    Code Status:   NO CPR  -  ALLOW NATURAL DEATH     Residence: Condo    PCP: Shelbie Proctor, MD  Patient Contact:   [phone] (home)     There is no such number on file (mobile).   Emergency contact:   Extended Emergency Contact Information  Primary Emergency Contact: sullender,jill  Mobile Phone: [phone]  Relation: Daughter   ADL/IADL's: Independent and Assistive Device  Previous Level of function: 6 Modified Independent     DME: Rollator (4 wheeled walker)  Shower stool   Grab bars    Pharmacy:     [facility] [phone] [place], [place] - [address] [facility]  [address] [place] [address]  Phone: [phone] Fax: [phone]      Prescription Coverage: Yes    Home Health: The patient is not currently receiving home health services.    Previous SNF/AR: [facility]    COVID Vaccine Status: x3    Transport for discharge? Mode of transportation: Sales executive - Family/Friend to drive patient   Assessment   Met with pts daughter @ bedside for CM routine screening, assessment and care coordination for discharge planning, mask & goggles worn while in room w/ pt. Pt lives alone @ [facility], she has a private pay caregiver 5-6 days a week/3-4 hours a day. PTA pt was fully independent, caregiver is with pt for supervision for bathing & assists w/ IADLs as needed. Dr. Loa Socks is pts PCP. PT OT pending. Pt has been to  Acute Rehab @ [facility] in the past. All questions answered prior to leaving pts room.   BARRIERS TO DISCHARGE: none at this time   Recommendation   [place] to be determined as pt progresses through hospital stay. CM will continue to follow.      Steele Berg, BSN RN  Case Management  [facility]  [address], [address]  [place], [place] [address]  T [phone]   F [phone]

## 2021-02-12 NOTE — OT Eval Note (Signed)
Occupational Therapy Evaluation  Patient: Yolanda Cox    ST03/ST03.A  Discharge Recommendations:   Based on today's session: SNF     If SNF is not available, then the patient will need home health services, increase supervision , assistance with mobility, assistance with ADL's, and assistance with IADL's  If pt returns home, DME Recommended for Discharge: Front wheel walker          Unit: [facility] SURGICAL TRAUMA INTENSIVE CARE  Bed: ST03/ST03.A      Assessment:   Yolanda Cox is a 87 y.o. female admitted 02/11/2021.   Brief chart review completed including review of labs, review of vitals, calling pt's family/caregiver for thorough history, review of H&P and physician progress notes, and review of consulting physician notes .  Pt's ability to complete ADLs and functional transfers is impaired due to the following deficits:  decreased activity tolerance, decreased balance, decreased bed mobility, pain, and decreased strength.  Pt demonstrates performance deficits with grooming, bathing, dressing, toileting, and functional mobility. There are many comorbidities or other factors that affect plan of care and require modification of task including: assistive device needed for mobility, frequent falls, and lives alone.  Pt would continue to benefit from OT to address these deficits and increase functional independence.    Assessment: decreased strength;balance deficits;decreased independence with ADLs;decreased safety awareness;decreased cognition;decreased independence with IADLs;decreased endurance/activity tolerance     Complexity Chart Review Performance Deficits Clinical Decision Making Hx/Comorbidities Assistance needed   Low Brief 1-3 Limited options None None (or at baseline)   Moderate Expanded 3-5 Several Options 1-2 Min/Mod assist (not at baseline)   High Extensive 5 or more Multiple options 3 or more Max/dependent (not at baseline     Therapy Diagnosis: generalized weakness,  decreased functional mobility , decreased independence with ADL's, and decreased endurance/ activity engagement due to s/p fall. Without therapy interventions, patient is at risk for falls, dependence on caregivers for mobility, dependence on caregivers for ADL's, and failure to return to PLOF.    Rehabilitation Potential: Prognosis: Good;With continued OT s/p acute discharge;With family      Plan:   OT Frequency Recommended: 2-3x/wk   Treatment Interventions: ADL retraining;Functional transfer training;UE strengthening/ROM;Endurance training;Patient/Family training;Equipment eval/education;Neuro muscular reeducation     Patient Goal  Patient Goal: No goal stated    Risks/Benefits/POC Discussed with Pt/Family: With patient/family    Goals:   Goal Formulation: Patient;Family (daughter present)  Time For Goal Achievement: 5 visits  ADL Goals  Patient will groom self: Stand by Assist;at sinkside;3 visits  Patient will dress upper body: Stand by Assist;3 visits  Patient will dress lower body: Minimal Assist;3 visits  Patient will toilet: Minimal Assist;3 visits  Mobility and Transfer Goals  Pt will transfer bed to toilet: Minimal Assist;with rolling walker;3 visits  Neuro Re-Ed Goals  Pt will perform dynamic standing balance: Minimal Assist;for 10 minutes;to complete standing ADLs safely;3 visits                          ST03/ST03.A    Time of treatment: Time Calculation  OT Received On: 02/12/21  Start Time: 1038  Stop Time: 1101  Time Calculation (min): 23 min  OT Visit Number: 1    Consult received for Yolanda Cox for OT Evaluation and Treatment.  Patients medical condition is appropriate for Occupational therapy intervention at this time.    Precautions and Contraindications:   Precautions  Weight Bearing Status: no restrictions  Fall Risks: High;Impaired balance/gait;Impaired mobility      Medical Diagnosis: SAH (subarachnoid hemorrhage) [I60.9]    History of Present Illness: Yolanda Cox is a  87 y.o. female admitted on 02/11/2021 with s/p fall.   (Per H&P) : Yolanda Cox is a 87 y.o. female who  has a past medical history of Anemia, Fibromyalgia, Hyperlipidemia, hypothyroid, Osteoporosis, Rheumatoid, and TIA (transient ischemic attack)., presents to the ER for evaluation.  Patient was reportedly found down at her semiindependent living at [facility].  It is unknown for how long she was incapacitated.  Patient Active Problem List   Diagnosis    Fall, initial encounter    Seizure after head injury    SAH (subarachnoid hemorrhage)    Disorder of thyroid        Past Medical/Surgical History:  Past Medical History:   Diagnosis Date    Anemia     Fibromyalgia     Hyperlipidemia     hypothyroid     Osteoporosis     Rheumatoid     TIA (transient ischemic attack)       Past Surgical History:   Procedure Laterality Date    CHOLECYSTECTOMY           X-Rays/Tests/Labs:      Social History:  Prior Level of Function  Prior level of function: Independent with ADLs, Ambulates with assistive device  Assistive Device: Four wheel walker  Baseline Activity Level: Community ambulation  Cooking: light meal prep  DME Currently at Home: Environmental consultant, Four Wheel  Home Living Arrangements  Living Arrangements: Alone (has a caregiver 5-6 days per week for 3-4 hours, her daughter will stop by on days when she does not have assistance to make her breakfast, pt goes to one of the on campus restaurants for dinner daily)  Type of Home: Apartment [employer] [employer])  Home Layout: One level, Occupational psychologist Shower/Tub: Pension scheme manager: Raised  Bathroom Equipment: Grab bars in shower, Built-in shower seat, Grab bars around toilet, Hand-held shower  Bathroom Accessibility: Accessible via walker  DME Currently at Home: Environmental consultant, Four Wheel  Home Living - Notes / Comments: Pt receives supervision for showering.      Subjective:   Patient is agreeable to participation in the therapy session. Nursing clears patient for  therapy.     Pain Assessment  Pain Assessment: Wong-Baker FACES  Wong-Baker FACES Pain Rating: Hurts even more  POSS Score: Awake and Alert  Pain Location: Back  Pain Orientation: Lower;Mid  Pain Descriptors: Aching  Pain Frequency: Intermittent;Increases with movement  Effect of Pain on Daily Activities: moderate  Pain Intervention(s): Repositioned;Ambulation/increased activity.        Objective:   Observation of Patient/Vital Signs:  Patient is in bed with telemetry, SCD's, peripheral IV, and female external catheter in place.         Cognition/Neuro Status  Arousal/Alertness: Delayed responses to stimuli  Attention Span: Attends to task with redirection  Orientation Level: Oriented to person;Oriented to place  Memory: Decreased short term memory;Decreased recall of recent events  Following Commands: minimal verbal instruction  Safety Awareness: maximal verbal instruction  Insights: Decreased awareness of deficits;Educated in safety awareness  Problem Solving: Assistance required to identify errors made;Assistance required to generate solutions;Assistance required to implement solutions;moderate assistance (for safety)  Behavior: calm;cooperative;flat affect  Motor Planning: decreased processing speed;decreased initiation  Coordination: intact  Hand Dominance: right handed    Gross ROM  Right Upper Extremity ROM: within functional  limits  Left Upper Extremity ROM: within functional limits  Right Lower Extremity ROM: within functional limits  Left Lower Extremity ROM: within functional limits  Gross Strength  Right Upper Extremity Strength: 3+/5  Left Upper Extremity Strength: 3+/5  Right Lower Extremity Strength: 3+/5  Left Lower Extremity Strength: 3+/5          Sensory  Auditory: impaired left;impaired right;hearing aid left;hearing aid right  Tactile - Light Touch: intact  Visual Acuity: intact       Self-care and Home Management  Grooming: Maximal Assist;in bed;wash/dry hands;wash/dry face  LB Dressing:  Dependent;in bed;Don/doff R sock;Don/doff L sock  Toileting: Dependent  Functional Transfers: Moderate Assist    Mobility and Transfers  Rolling: Minimal Assist;Moderate Assist;to Left  Scooting to EOB: Moderate Assist;Increased Effort;using bedrail  Supine to Sit: Maximal Assist;Moderate Assist;Increased Effort  Sit to Stand: Moderate Assist;Maximal Assist;with instruction for hand placement to increase safety  Bed to Chair: Moderate Assist;Maximal Assist  Functional Mobility/Ambulation: Maximal Assist (pt ambulated 2 side steps form bed to chair)   Patient instructed on proper body mechanics for safe sit <> stands including pushing up with BUEs from surface rather then pulling up on FWW, feeling for surface on back of legs prior to sitting and reaching back with one UE at a time before sitting.  Patient instructed in safe use and navigation with RW including proper hand placement, body mechanics, and positioning while completing functional ambulation. Patient educated and instructed on scanning the environment for potential hazards to reduce risk of falls    Balance  Static Sitting Balance: good  Dyanamic Sitting Balance: good  Static Standing Balance: fair (hand held)  Dynamic Standing Balance: fair (hand held)    Participation and Endurance  Participation Effort: excellent  Endurance: Tolerates < 10 min exercise, no significant change in vital signs    AM-PAC "6 Clicks" Daily Activity Inpatient Short Form  Inpatient AM-PAC Performed?: yes  Put On/Take Off Lower Body Clothing: Total  Assist with Bathing: Total  Assist with Toileting: A lot  Put On/Take Off Upper Body Clothing: A lot  Assist with Grooming: A lot  Assist with Eating: A little  OT Daily Activity Raw Score: 11  CMS 0-100% Score: 70.42%    PMP - Progressive Mobility Protocol   PMP Activity: Step 5 - Chair  Distance Walked (ft) (Step 6,7): 2 Feet    Treatment Activities: Orders received, chart reviewed and evaluation and initial tx completed as  indicated above. Educated the patient to role of occupational therapy, plan of care, goals of therapy, HEP, safety with mobility and ADLs, and energy conservation techniques. Educated pt and family on importance of OOB mobility for overall strength and endurance, respiratory health, circulation and skin integrity. Pt agreeable to sitting in chair. Instructed in proper use of spirometer(10 times per hour or 2-3 times every 6-10 minutes)  to help maximize pulmonary hygiene and advised patient to sit at edge of bed or out of bed frequently through the day to ensure the usage of the lower lobes of the lungs. Verbally instructed pt to perform the following UB therex intermittently t/o the day in order to increase pt's strength and endurance for ADL performance: 10 reps of B/L shoulder flex/ext, elbow flex/ext, and digit flex/ext of B hands. Pt was very sleepy and requested to rest in chair. Pt left resting in chair with call bell placed within reach. Alarm activated. RN notified of the outcome.      [place] ,OTR/L  [facility]  Physical Medicine and Rehab Department  Pager # [phone]               Therapist PPE during session procedural mask and gloves .

## 2021-02-12 NOTE — ED Triage Notes (Signed)
Pt to ED via EMS 635, fall at her semi-independent living at [facility]. Unknown down time, approx 300 ml blood in the floor per EMS. Pt c-collared by EMS prior to arrival. Pt with head jury. Bleeding controlled by EMS bandage. GCS 14 on arrival. Pt also slow to answer questions, follows commands well.

## 2021-02-12 NOTE — Plan of Care (Signed)
STICU DAILY CHECKLIST           Analgesia/sedation:         Pain well controlled:   [x]  Yes    []  No  Continuous Sedation: []  Yes    [x]  No    Sedation vacation:      []  Yes    []  No   [x]  N/A   Last CPOT:    Last RASS: RASS Score: Drowsy    Restraint(s):  [x]  N/A    []  Discontinue    []  Start/Continue to prevent self-harm   Delirium:  Positive or Negative for Delirium?:Positive or Negative for Delirium: Negative  CAM-ICU:     [x]  N/A    []  Discontinue    []  Start/Continue     Respiratory:              []  Mechanical Ventilation, Days __  [x]  N/A   Readiness to wean & extubate (SAT/SBT):    []  Yes   []  No  [x]  N/A   Needs Trach:    []  Yes    []  No   [x]  N/A   HOB 30-45 degrees  [x]  Yes    []  No, reason:     Gasterointestinal:    Bowel Regimen:    [x]  Yes    []  No  Nutrition at goal?   [x]  Yes    []  No   []  N/A   Ulcer Prophylaxis: [x]  Not indicated  []  Discontinue    []  Start/Continue  Rectal tube:           [x]  N/A    []  Discontinue    []  Insert/Continue, Days   Glucose controlled []  Yes    [x]  No     Genitourinary:   Electrolyte protocol ordered:  []  Yes  [x]  N/A   Fluids:   []  Start/Continue  []  Discontinue   [x]  N/A     Hospital-Acquired Infections:  Foley:    [x]  None   []  Discontinue    []  Insert/Continue, Days__  Lines/Tubes:  [x]  NA  []  CVC []  Aline []  PICC/midline []  Surgical drain []  Chest tube []  Other  []  Discontinue   []  Insert/Continue, Days__     Antibiotics:  []  N/A   [x]  Stop date ordered    [x]  Appropriate for current cultures  Fever protocol ordered:    [x] Yes  [] N/A     MSK:  Cervical spine clearance reviewed: [x]  Yes  []  N/A   PT/OT appropriate:  [x] Start/Continue  [] No   [] N/A   Activity (PT/OT):    [] Bedrest    [x] OOB/Ambulate  Current Mobility Level:   PMP Activity: Step 3 - Bed Mobility (02/12/2021  8:00 AM)    VTE Prevention:     [x]  SCDs    []  Chemical prophylaxis    []  Therapeutic AC      [x]  On hold: ICH    All labs/images reviewed: [x]  Yes  []  N/A   Daily labs/images ordered:  [x]   Yes  []  N/A   Home medications reviewed and started if appropriate: [x]  Yes   []  No  Family updated:    [x]  Yes  []  N/A     Germaine Pomfret, PA

## 2021-02-12 NOTE — ED Provider Notes (Signed)
Nursing (triage) note reviewed for the following pertinent information:         History     Chief Complaint   Patient presents with    Fall    Head Injury     The history is provided by the patient.      87 yo F with h/o HLD, RA, TIA, cognitive deficits, NPH, Seizures not currently on Keppra not anticoagulated living at [facility] reportedly independent ambulatory at baseline otherwise unsure of patient's baseline here via EMS due to fall.  Unsure who called EMS or how long patient was down EMS reports that she was in her kitchen and had obvious trauma to her head.  She seemed confused although they do not know what her baseline is.  They state that she has been conversant with them and following commands.  Patient denies headache reports that she is nauseous.  Denies change in vision she denies neck pain chest pain abdominal pain back pain or pain in her extremities.  She cannot tell me what happened tonight.  Unclear if there is syncope or loss of consciousness unclear what provoked fall.    Past Medical History:   Diagnosis Date    Anemia     Fibromyalgia     Hyperlipidemia     hypothyroid     Osteoporosis     Rheumatoid     TIA (transient ischemic attack)        Past Surgical History:   Procedure Laterality Date    CHOLECYSTECTOMY         History reviewed. No pertinent family history.    Social  Social History     Tobacco Use    Smoking status: Former    Smokeless tobacco: Never   Haematologist Use: Never used   Substance Use Topics    Alcohol use: Not Currently    Drug use: Never       .     Allergies   Allergen Reactions    Biaxin [Clarithromycin] Rash    Zithromax [Azithromycin] Rash       Home Medications       Med List Status: Complete Set By: Kathleen Lime, RN at 02/12/2021  7:39 AM              acetaminophen (TYLENOL) 325 MG tablet     Take 2 tablets (650 mg total) by mouth every 6 (six) hours as needed for Pain     amLODIPine (NORVASC) 10 MG tablet     Take 1 tablet (10 mg total) by mouth daily      aspirin 81 MG chewable tablet     Chew 1 tablet (81 mg total) by mouth daily     atorvastatin (LIPITOR) 40 MG tablet     Take 1 tablet (40 mg total) by mouth nightly     calcium carbonate (OS-CAL) 600 MG Tab tablet     Take 1 tablet (600 mg) by mouth daily     folic acid (FOLVITE) 1 MG tablet     Take 1 tablet (1 mg) by mouth daily     levETIRAcetam (KEPPRA) 500 MG tablet     Take 1 tablet (500 mg total) by mouth every 12 (twelve) hours     levothyroxine (SYNTHROID) 75 MCG tablet     Take 1 tablet (75 mcg total) by mouth Once a day at 6:00am     methotrexate 2.5 MG tablet     Take  by mouth once a week     Multiple Vitamin (multivitamin) capsule     Take 1 capsule by mouth daily     Omega-3 Fatty Acids (OMEGA-3 FISH OIL PO)     Take 1,000 mg by mouth daily     ondansetron (ZOFRAN-ODT) 4 MG disintegrating tablet     Take 1 tablet (4 mg total) by mouth every 8 (eight) hours as needed for Nausea     sertraline (ZOLOFT) 50 MG tablet     Take 1 tablet (50 mg total) by mouth daily     vitamin D (cholecalciferol) 25 MCG (1000 UT) tablet     Take 1 tablet (25 mcg) by mouth daily             Review of Systems   Constitutional:  Negative for chills and fever.   Respiratory:  Negative for shortness of breath.    Cardiovascular:  Negative for chest pain.   Gastrointestinal:  Negative for abdominal pain, nausea and vomiting.   Musculoskeletal:  Negative for back pain and neck pain.   Skin:  Positive for wound. Negative for rash.   Neurological:  Positive for headaches. Negative for weakness.   All other systems reviewed and are negative.    Physical Exam    BP: 144/67, Heart Rate: (!) 107, Temp: (!) 96.8 F (36 C), Resp Rate: 19, SpO2: 100 %, Weight: 65.2 kg    Physical Exam  Vitals and nursing note reviewed.   Constitutional:       General: She is not in acute distress.     Appearance: Normal appearance. She is well-developed. She is not ill-appearing or toxic-appearing.      Comments: Elderly-appearing drowsy but arousable  and responds to verbal stimuli and she is following commands obvious hematoma to the left parietal scalp with abrasion no laceration   HENT:      Head: Normocephalic.      Nose: Nose normal.   Eyes:      Extraocular Movements: Extraocular movements intact.      Conjunctiva/sclera: Conjunctivae normal.      Pupils: Pupils are equal, round, and reactive to light.      Comments: Pupils are equal and reactive 2 mm   Neck:      Comments: In cervical collar  Cardiovascular:      Rate and Rhythm: Normal rate and regular rhythm.      Pulses: Normal pulses.      Heart sounds: Normal heart sounds.   Pulmonary:      Effort: Pulmonary effort is normal.      Breath sounds: Normal breath sounds.   Chest:      Comments: No chest wall tenderness no ecchymosis or erythema  Abdominal:      General: There is no distension.      Palpations: Abdomen is soft.      Tenderness: There is no abdominal tenderness.   Musculoskeletal:      Comments: No obvious trauma to the back no midline bony tenderness   Skin:     General: Skin is warm and dry.   Neurological:      Mental Status: She is alert.      Comments: Oriented to person, no facial asymmetry equal grip strength 4-5 strength in bilateral lower extremities         MDM and ED Course     ED Medication Orders (From admission, onward)      Start Ordered     Status  Ordering Provider    02/12/21 0020 02/12/21 0019  levETIRAcetam (KEPPRA) 500 mg in sodium chloride 0.9 % 100 mL IVPB  Once        Route: Intravenous  Ordered Dose: 500 mg     Last MAR action: Stopped Marquize Seib A    02/12/21 0016 02/12/21 0015  ondansetron (ZOFRAN) injection 4 mg  Once        Route: Intravenous  Ordered Dose: 4 mg     Last MAR action: Given Zeyna Mkrtchyan A               MDM    87 year old female presents to the emergency department after an unwitnessed fall down for an unknown amount of time found in her kitchen not anticoagulated obvious head trauma    Patient is hemodynamically stable  I saw the patient  immediately upon arrival she was sent for CT of her head and C-spine  Clinically have a low suspicion for intrathoracic or intra-abdominal trauma given this was a ground-level fall unclear if the patient had a syncopal episode unclear if there is a medical issue that provoked this fall      On chart review patient does have history of seizures unclear if she could have had a seizure that provoked this  She is not able to give much history I do not know what her baseline mental status is  At this time she is protecting her airway  I will reach out to her family as a see her previous CODE STATUS is DO NOT RESUSCITATE allow natural death to make sure that in the setting of head injury with any neurologic deterioration whether or not she would be okay with intubation as she is currently not able to make her own decisions    EKG Interpretation  EKG interpreted independently by Dr. Francis Gaines    Rate: Tachycardic  Rhythm: atrial fibrillation  Axis: Normal  ST-T Segments: no st elevation rate related ST depression no ST elevation  Conduction: No blocks  Impression: Dysrhythmia-Atrial    No previous EKG on record.        ED Course as of 02/13/21 0114   Mon Feb 12, 2021   0022 Spoke with radiology pt has small SAH and hematoma she is alert following commands GCS 14 nauseated, protecting her airway, will speak with trauma and NSGY is not anticoagulated, found down in her [employer] apartment in the kitchen. She can not provide history, is following commands moving all extremities [BL]   0029 Spoke with NSGY recommend over night q1 neuro checks keepra 500mg  bid,hob 30 degrees, if not expanding no surgery spoke with Dr. Kaylyn Lim admitting to STICU [BL]   0100 Patient's daughter at bedside confirms that she would not want intubation in any situation including due to a head injury patient has remained able to protect her airway she seems severely concussed. Would consider hospice care if patient does not improve clinically    Labs significant for leukocytosis,CXR clear, possible inflammatory response due to seizure vs infection, no evidence of severe infection/sepsis on physical exam, UA pending [BL]      ED Course User Index  [BL] Nicanor Alcon, MD             Critical Care  Performed by: Nicanor Alcon, MD  Authorized by: Nicanor Alcon, MD     Critical care provider statement:     Critical care time (minutes):  35    Critical care was  necessary to treat or prevent imminent or life-threatening deterioration of the following conditions:  CNS failure or compromise    Critical care was time spent personally by me on the following activities:  Review of old charts, re-evaluation of patient's condition, ordering and review of radiographic studies, ordering and review of laboratory studies, ordering and performing treatments and interventions, obtaining history from patient or surrogate, examination of patient, discussions with consultants and development of treatment plan with patient or surrogate    I assumed direction of critical care for this patient from another provider in my specialty: no      Care discussed with: admitting provider      Clinical Impression & Disposition     Clinical Impression  Final diagnoses:   SAH (subarachnoid hemorrhage)   Fall, initial encounter   Hematoma of scalp, initial encounter   Concussion with unknown loss of consciousness status, initial encounter   Leukocytosis, unspecified type        ED Disposition       ED Disposition   Admit    Condition   --    Date/Time   Mon Feb 12, 2021  2:09 AM    Comment   Admitting Physician: Frutoso Schatz [[id number]]   Service:: Surgery [124]   Estimated Length of Stay: > or = to 2 midnights   Tentative Discharge Plan?: Home or Self Care [1]   Special Needs:: Fall Risk                  Current Discharge Medication List                      Nicanor Alcon, MD  02/13/21 [phone]

## 2021-02-12 NOTE — Plan of Care (Addendum)
Report received from [facility], [place] in ED. Patient A&Ox2-3, confused, GCS 14, obeys commands, MAEs, speech is delayed, Q1 neuro checks.     Diastolic  BP in the 40s and Blood glucose in the 300s, Catha Gosselin, NP notified, no new orders or interventions. Pt had 2 BM upon arrival, K-3.8  and Mag 1.8 replaced. Patient resting in bed.             Problem: Moderate/High Fall Risk Score >5  Goal: Patient will remain free of falls  Outcome: Progressing     Problem: Safety  Goal: Patient will be free from injury during hospitalization  Outcome: Progressing  Goal: Patient will be free from infection during hospitalization  Outcome: Progressing     Problem: Side Effects from Pain Analgesia  Goal: Patient will experience minimal side effects of analgesic therapy  Outcome: Progressing     Problem: Discharge Barriers  Goal: Patient will be discharged home or other facility with appropriate resources  Outcome: Progressing     Problem: Psychosocial and Spiritual Needs  Goal: Demonstrates ability to cope with hospitalization/illness  Outcome: Progressing

## 2021-02-13 LAB — BASIC METABOLIC PANEL
Anion Gap: 7 (ref 5.0–15.0)
BUN: 14 mg/dL (ref 7.0–21.0)
CO2: 22 mEq/L (ref 17–29)
Calcium: 8.5 mg/dL (ref 7.9–10.2)
Chloride: 107 mEq/L (ref 99–111)
Creatinine: 0.7 mg/dL (ref 0.4–1.0)
Glucose: 171 mg/dL — ABNORMAL HIGH (ref 70–100)
Potassium: 3.7 mEq/L (ref 3.5–5.3)
Sodium: 136 mEq/L (ref 135–145)

## 2021-02-13 LAB — GLUCOSE WHOLE BLOOD - POCT
Whole Blood Glucose POCT: 221 mg/dL — ABNORMAL HIGH (ref 70–100)
Whole Blood Glucose POCT: 261 mg/dL — ABNORMAL HIGH (ref 70–100)
Whole Blood Glucose POCT: 194 mg/dL — ABNORMAL HIGH (ref 70–100)
Whole Blood Glucose POCT: 214 mg/dL — ABNORMAL HIGH (ref 70–100)

## 2021-02-13 LAB — CBC
Absolute NRBC: 0.02 10*3/uL — ABNORMAL HIGH (ref 0.00–0.00)
Hematocrit: 24.3 % — ABNORMAL LOW (ref 34.7–43.7)
Hgb: 7.8 g/dL — ABNORMAL LOW (ref 11.4–14.8)
MCH: 32.2 pg (ref 25.1–33.5)
MCHC: 32.1 g/dL (ref 31.5–35.8)
MCV: 100.4 fL — ABNORMAL HIGH (ref 78.0–96.0)
MPV: 11.1 fL (ref 8.9–12.5)
Nucleated RBC: 0.1 /100 WBC — ABNORMAL HIGH (ref 0.0–0.0)
Platelets: 166 10*3/uL (ref 142–346)
RBC: 2.42 10*6/uL — ABNORMAL LOW (ref 3.90–5.10)
RDW: 17 % — ABNORMAL HIGH (ref 11–15)
WBC: 34.03 10*3/uL — ABNORMAL HIGH (ref 3.10–9.50)

## 2021-02-13 LAB — MAGNESIUM: Magnesium: 1.9 mg/dL (ref 1.6–2.6)

## 2021-02-13 LAB — GFR: EGFR: >60

## 2021-02-13 LAB — PHOSPHORUS: Phosphorus: 1.6 mg/dL — ABNORMAL LOW (ref 2.3–4.7)

## 2021-02-13 MED ORDER — ACETAMINOPHEN 325 MG PO TABS
650.0000 mg | ORAL_TABLET | Freq: Four times a day (QID) | ORAL | Status: DC
Start: 2021-02-13 — End: 2021-02-15
  Administered 2021-02-13 – 2021-02-15 (×8): 650 mg via ORAL
  Filled 2021-02-13 (×8): qty 2

## 2021-02-13 MED ORDER — DONEPEZIL HCL 5 MG PO TABS
5.0000 mg | ORAL_TABLET | Freq: Every evening | ORAL | Status: DC
Start: 2021-02-13 — End: 2021-02-15
  Administered 2021-02-14: 5 mg via ORAL
  Filled 2021-02-13 (×2): qty 1

## 2021-02-13 MED ORDER — ENOXAPARIN SODIUM 40 MG/0.4ML IJ SOSY
40.0000 mg | PREFILLED_SYRINGE | INTRAMUSCULAR | Status: DC
Start: 2021-02-13 — End: 2021-02-15
  Administered 2021-02-13 – 2021-02-15 (×3): 40 mg via SUBCUTANEOUS
  Filled 2021-02-13 (×3): qty 0.4

## 2021-02-13 MED ORDER — METHOTREXATE SODIUM 2.5 MG PO TABS
7.5000 mg | ORAL_TABLET | ORAL | Status: DC
Start: 2021-02-16 — End: 2021-02-15

## 2021-02-13 NOTE — Plan of Care (Signed)
STICU DAILY CHECKLIST           Analgesia/sedation:         Pain well controlled:   [x]  Yes    []  No  Continuous Sedation: []  Yes    [x]  No    Sedation vacation:      []  Yes    []  No   [x]  N/A   Last CPOT:    Last RASS: RASS Score: Drowsy    Restraint(s):  [x]  N/A    []  Discontinue    []  Start/Continue to prevent self-harm   Delirium:  Positive or Negative for Delirium?:Positive or Negative for Delirium: Negative  CAM-ICU:     []  N/A    []  Discontinue    [x]  Start/Continue     Respiratory:              []  Mechanical Ventilation, Days __  [x]  N/A   Readiness to wean & extubate (SAT/SBT):    []  Yes   []  No  [x]  N/A   Needs Trach:    []  Yes    [x]  No   []  N/A   HOB 30-45 degrees  [x]  Yes    []  No, reason:     Gasterointestinal:    Bowel Regimen:    [x]  Yes    []  No  Nutrition at goal?   [x]  Yes    []  No   []  N/A   Ulcer Prophylaxis: [x]  Not indicated  []  Discontinue    []  Start/Continue  Rectal tube:           [x]  N/A    []  Discontinue    []  Insert/Continue, Days   Glucose controlled [x]  Yes    []  No     Genitourinary:   Electrolyte protocol ordered:  []  Yes  [x]  N/A   Fluids:   [x]  Start/Continue  []  Discontinue   []  N/A     Hospital-Acquired Infections:  Foley:    [x]  None   []  Discontinue    []  Insert/Continue, Days__  Lines/Tubes:  [x]  NA  []  CVC []  Aline []  PICC/midline []  Surgical drain []  Chest tube []  Other  []  Discontinue   []  Insert/Continue, Days__     Antibiotics:  []  N/A   []  Stop date ordered    [x]  Appropriate for current cultures  Fever protocol ordered:    [] Yes  [x] N/A     MSK:  Cervical spine clearance reviewed: [x]  Yes  []  N/A   PT/OT appropriate:  [x] Start/Continue  [] No   [] N/A   Activity (PT/OT):    [] Bedrest    [x] OOB/Ambulate  Current Mobility Level:   PMP Activity: Step 3 - Bed Mobility (02/13/2021  8:00 AM)    VTE Prevention:     [x]  SCDs    [x]  Chemical prophylaxis    []  Therapeutic AC      []  On hold:     All labs/images reviewed: [x]  Yes  []  N/A   Daily labs/images ordered:  [x]  Yes   []  N/A   Home medications reviewed and started if appropriate: [x]  Yes   []  No  Family updated:    [x]  Yes  []  N/A     Dyane Dustman, NP

## 2021-02-13 NOTE — Plan of Care (Signed)
Problem: Moderate/High Fall Risk Score >5  Goal: Patient will remain free of falls  Outcome: Progressing

## 2021-02-13 NOTE — Plan of Care (Signed)
Problem: Moderate/High Fall Risk Score >5  Goal: Patient will remain free of falls  Outcome: Progressing     Problem: Safety  Goal: Patient will be free from injury during hospitalization  Outcome: Progressing  Goal: Patient will be free from infection during hospitalization  Outcome: Progressing     Problem: Pain  Goal: Pain at adequate level as identified by patient  Outcome: Progressing     Problem: Side Effects from Pain Analgesia  Goal: Patient will experience minimal side effects of analgesic therapy  Outcome: Progressing     Problem: Discharge Barriers  Goal: Patient will be discharged home or other facility with appropriate resources  Outcome: Progressing     Problem: Compromised Tissue integrity  Goal: Damaged tissue is healing and protected  Outcome: Progressing  Goal: Nutritional status is improving  Outcome: Progressing

## 2021-02-13 NOTE — Progress Notes (Signed)
TRAUMA and ACUTE CARE SURGERY PROGRESS NOTE          Date Time: 02/13/21 9:57 AM  Patient Name: Yolanda Cox  Attending Physician: Frutoso Schatz, MD  Type of Admission: Inpatient       ASSESSMENT/PLAN:   Fall at long-term nursing facility.  SAH and Neurosurgery following, but family wishing not to pursue surgery, DNR/DNI.    Neuro:  - SAH, Nsx signed off since nonoperative  - GCS15, oriented x 2 (not time)  - q4h neurochecks  - no further imaging required  - history of seizure x1 on home dose keppra  - cont sertraline, aricept  - hold home trazodone    CV:  - SBP goal < 150 per Nsx  - resume home amlodipine    Resp:  - on 3LNC, none at baseline  - pulm toilet    GI:  - diet: CC  - having BMs    GU:  - fluid balance: goal neutral  - retaining some urine  - Ucx pending  - stop IVFs    Heme:  - ppx: start lovenox today    ID:  - wbc 50s, concern over UTI CPOA, now down to 34  - abx: ceftriaxone day 2/5  - Ucx and Bcx pending    Endo:  - goal glc < 180  - on ISS, reported history of borderline diabetes  - home levothyroxine    MSK:  - weekly methotrexate for RA (on Fridays)    Dispo:  - PTOT: recommending SNF  - transfer to floor today    SUBJECTIVE:   Did well overnight without acute issues.  Family wishing not to pursue surgical intervention.  Due to no intent for surgery: no further imaging necessary per Neurosurgery.  She is a little sleepy this morning but offers no complaints.  MEDICATIONS:     Current Facility-Administered Medications   Medication Dose Route Frequency    atorvastatin  40 mg Oral QHS    calcium citrate-vitamin D  1 tablet Oral Daily    cefTRIAXone  1 g Intravenous Q24H    heparin (porcine)  5,000 Units Subcutaneous Q8H SCH    insulin lispro  1-4 Units Subcutaneous QHS    insulin lispro  1-8 Units Subcutaneous TID AC    lactobacillus/streptococcus  1 capsule Oral Daily    levETIRAcetam  500 mg Oral Q12H [facility]    levothyroxine  75 mcg Oral Daily at 0600    lidocaine  1  patch Transdermal Q24H    methocarbamol  500 mg Oral QID    senna-docusate  1 tablet Oral Q12H SCH    sertraline  50 mg Oral Daily     PHYSICAL EXAM:     Vitals:    02/13/21 0500 02/13/21 0520 02/13/21 0600 02/13/21 0800   BP: 135/52 130/47 125/48    Pulse: 76 75 74    Resp: 21 20 16     Temp:    98.3 F (36.8 C)   TempSrc:    Temporal   SpO2: 93% 93% 92%    Weight:       Height:           Physical Exam  Vitals and nursing note reviewed.   Constitutional:       General: She is not in acute distress.     Comments: Drowsy but responsive   HENT:      Head:      Comments: L temporal lac c/d/i  Mouth/Throat:      Mouth: Mucous membranes are moist.      Pharynx: Oropharynx is clear.   Eyes:      Extraocular Movements: Extraocular movements intact.      Pupils: Pupils are equal, round, and reactive to light.   Cardiovascular:      Rate and Rhythm: Normal rate.      Pulses: Normal pulses.   Pulmonary:      Effort: Pulmonary effort is normal.   Abdominal:      General: There is no distension.      Palpations: Abdomen is soft.      Tenderness: There is no abdominal tenderness.   Musculoskeletal:         General: Normal range of motion.      Cervical back: Normal range of motion.   Skin:     General: Skin is warm and dry.   Neurological:      General: No focal deficit present.      Comments: Oriented to self and place but not time       Patient Lines/Drains/Airways Status       Active PICC Line / CVC Line / PIV Line / Drain / Airway / Intraosseous Line / Epidural Line / ART Line / Line / Wound / Pressure Ulcer / NG/OG Tube       Name Placement date Placement time Site Days    Peripheral IV 02/12/21 18 G Standard Right Antecubital 02/12/21  0022  Antecubital  1    Peripheral IV 02/12/21 20 G Diffusion Left Antecubital 02/12/21  0038  Antecubital  1    External Urinary Catheter 02/12/21  0330  --  1    Wound 02/12/21 Moisture Associated Skin Damage (MASD) Perineum Other (Comment) blanchable redness 02/12/21  0330  Perineum  1                   Intake and Output Summary (Last 24 hours) at Date Time    Intake/Output Summary (Last 24 hours) at 02/13/2021 0957  Last data filed at 02/13/2021 0930  Gross per 24 hour   Intake 841.67 ml   Output 900 ml   Net -58.33 ml     LABS:     Results       Procedure Component Value Units Date/Time    Urine culture [[id number]] Collected: 02/12/21 0413    Specimen: Bladder Updated: 02/13/21 0819    Narrative:      ORDER#: [id number]                                    ORDERED BY: Francis Gaines  SOURCE: Urine                                        COLLECTED:  02/12/21 04:13  ANTIBIOTICS AT COLL.:                                RECEIVED :  02/12/21 07:36  Culture Urine                              PRELIM      02/13/21 08:19   +  02/13/21   Culture  requires further incubation, results to follow      Glucose Whole Blood - POCT [[id number]]  (Abnormal) Collected: 02/13/21 0801     Updated: 02/13/21 0808     Whole Blood Glucose POCT 221 mg/dL     Basic Metabolic Panel [[id number]]  (Abnormal) Collected: 02/13/21 0330    Specimen: Blood Updated: 02/13/21 0408     Glucose 171 mg/dL      BUN 78.2 mg/dL      Creatinine 0.7 mg/dL      Calcium 8.5 mg/dL      Sodium 956 mEq/L      Potassium 3.7 mEq/L      Chloride 107 mEq/L      CO2 22 mEq/L      Anion Gap 7.0    Narrative:      Trauma    Magnesium [[id number]] Collected: 02/13/21 0330    Specimen: Blood Updated: 02/13/21 0408     Magnesium 1.9 mg/dL     Narrative:      Trauma    GFR [[id number]] Collected: 02/13/21 0330     Updated: 02/13/21 0408     EGFR >60.0       Narrative:      Trauma    Phosphorus [[id number]]  (Abnormal) Collected: 02/13/21 0330     Updated: 02/13/21 0408     Phosphorus 1.6 mg/dL     Narrative:      Trauma    CBC without differential [[id number]]  (Abnormal) Collected: 02/13/21 0330    Specimen: Blood Updated: 02/13/21 0349     WBC 34.03 x10 3/uL      Hgb 7.8 g/dL      Hematocrit 03.4 %      Platelets 166 x10 3/uL      RBC 2.42 x10 6/uL      MCV 100.4 fL       MCH 32.2 pg      MCHC 32.1 g/dL      RDW 17 %      MPV 11.1 fL      Nucleated RBC 0.1 /100 WBC      Absolute NRBC 0.02 x10 3/uL     Narrative:      Trauma    Glucose Whole Blood - POCT [[id number]]  (Abnormal) Collected: 02/12/21 2107     Updated: 02/12/21 2111     Whole Blood Glucose POCT 189 mg/dL     Manual Differential [[id number]]  (Abnormal) Collected: 02/12/21 1723     Updated: 02/12/21 1924     Segmented Neutrophils 83 %      Band Neutrophils 3 %      Lymphocytes Manual 2 %      Monocytes Manual 10 %      Eosinophils Manual 0 %      Basophils Manual 0 %      Metamyelocytes 1 %      Myelocytes 1 %      Neutrophils Absolute Manual 40.33 x10 3/uL      Band Neutrophils Absolute 1.46 x10 3/uL      Lymphocytes Absolute Manual 0.97 x10 3/uL      Monocytes Absolute 4.86 x10 3/uL      Eosinophils Absolute Manual 0.00 x10 3/uL      Basophils Absolute Manual 0.00 x10 3/uL      Metamyelocytes Absolute 0.49 x10 3/uL      Myelocytes Absolute 0.49 x10 3/uL     Cell MorpHology [[id number]]  (Abnormal) Collected: 02/12/21 1723     Updated: 02/12/21  1924     Cell Morphology Abnormal     Platelet Estimate Normal     Polychromasia Present     Basophilic Stippling Present     Dual RBC Population Present    CBC and differential [[id number]]  (Abnormal) Collected: 02/12/21 1723    Specimen: Blood Updated: 02/12/21 1924     WBC 48.59 x10 3/uL      Hgb 8.6 g/dL      Hematocrit 40.9 %      Platelets 174 x10 3/uL      RBC 2.67 x10 6/uL      MCV 100.0 fL      MCH 32.2 pg      MCHC 32.2 g/dL      RDW 17 %      MPV 11.3 fL      Nucleated RBC 0.0 /100 WBC      Absolute NRBC 0.00 x10 3/uL     Basic Metabolic Panel [[id number]]  (Abnormal) Collected: 02/12/21 1723    Specimen: Blood Updated: 02/12/21 1817     Glucose 262 mg/dL      BUN 95.6 mg/dL      Creatinine 0.8 mg/dL      Calcium 9.0 mg/dL      Sodium 213 mEq/L      Potassium 4.0 mEq/L      Chloride 103 mEq/L      CO2 23 mEq/L      Anion Gap 7.0    Magnesium [[id number]] Collected:  02/12/21 1723    Specimen: Blood Updated: 02/12/21 1817     Magnesium 2.2 mg/dL     GFR [[id number]] Collected: 02/12/21 1723     Updated: 02/12/21 1817     EGFR >60.0       Lactic Acid [[id number]] Collected: 02/12/21 1723    Specimen: Blood Updated: 02/12/21 1753     Lactic Acid 1.8 mmol/L     Hemoglobin A1C [[id number]]  (Abnormal) Collected: 02/12/21 1305    Specimen: Blood Updated: 02/12/21 1702     Hemoglobin A1C 7.6 %      Average Estimated Glucose 171.4 mg/dL     Culture Blood Aerobic and Anaerobic [[id number]] Collected: 02/12/21 1305    Specimen: Blood, Venipuncture Updated: 02/12/21 1639    Narrative:      The order will result in two separate 8-21ml bottles  Please do NOT order repeat blood cultures if one has been  drawn within the last 48 hours  UNLESS concerned for  endocarditis  AVOID BLOOD CULTURE DRAWS FROM CENTRAL LINE IF POSSIBLE  Indications:->Bacteremia  1 BLUE+1 PURPLE    Culture Blood Aerobic and Anaerobic [[id number]] Collected: 02/12/21 1305    Specimen: Blood, Venipuncture Updated: 02/12/21 1639    Narrative:      The order will result in two separate 8-58ml bottles  Please do NOT order repeat blood cultures if one has been  drawn within the last 48 hours  UNLESS concerned for  endocarditis  AVOID BLOOD CULTURE DRAWS FROM CENTRAL LINE IF POSSIBLE  Indications:->Bacteremia  1 BLUE+1 PURPLE    Glucose Whole Blood - POCT [[id number]]  (Abnormal) Collected: 02/12/21 1616     Updated: 02/12/21 1622     Whole Blood Glucose POCT 258 mg/dL     Lactic Acid [[id number]] Collected: 02/12/21 1305    Specimen: Blood Updated: 02/12/21 1336     Lactic Acid 2.0 mmol/L     Narrative:      Cancel second specimen if the initial lactate level is less  than 2.0 mEq/L.    Glucose Whole Blood - POCT [[id number]]  (Abnormal) Collected: 02/12/21 1205     Updated: 02/12/21 1208     Whole Blood Glucose POCT 255 mg/dL           RADS:   Radiological procedure personally reviewed:     XR Humerus Left AP Lateral    Result Date:  02/12/2021  Left elbow and left humerus: 1. No definite fracture of the left elbow. Small elbow joint effusion. If there is persistent clinical concern for an occult/nondisplaced fracture, consider correlation with MRI or CT, as clinically warranted. 2. Superficial soft tissue gas overlying the ulna, likely corresponding to site of recent soft tissue injury. 3. No fracture of the left humerus. Fredrich Birks, MD  02/12/2021 9:41 PM    XR Elbow Left AP And Lateral    Result Date: 02/12/2021  Left elbow and left humerus: 1. No definite fracture of the left elbow. Small elbow joint effusion. If there is persistent clinical concern for an occult/nondisplaced fracture, consider correlation with MRI or CT, as clinically warranted. 2. Superficial soft tissue gas overlying the ulna, likely corresponding to site of recent soft tissue injury. 3. No fracture of the left humerus. Fredrich Birks, MD  02/12/2021 9:41 PM   EKG Results       Procedure Component Value Units Date/Time    ECG 12 lead [[id number]] Collected: 02/12/21 0008     Updated: 02/12/21 1614     Ventricular Rate 117 BPM      Atrial Rate 83 BPM      P-R Interval -- ms      QRS Duration 96 ms      Q-T Interval 352 ms      QTC Calculation (Bezet) 491 ms      P Axis -- degrees      R Axis 125 degrees      T Axis -66 degrees      IHS MUSE NARRATIVE AND IMPRESSION --     ATRIAL FIBRILLATION WITH RAPID VENTRICULAR RESPONSE  LATERAL MYOCARDIAL INFARCTION , AGE UNDETERMINED  ST & T WAVE ABNORMALITY, CONSIDER INFERIOR ISCHEMIA  ABNORMAL ECG  NO PREVIOUS ECGS AVAILABLE  Confirmed by Talmadge Chad MD, SARA ([id number]) on 02/12/2021 4:14:43 PM      Narrative:      ATRIAL FIBRILLATION WITH RAPID VENTRICULAR RESPONSE  LATERAL MYOCARDIAL INFARCTION , AGE UNDETERMINED  ST & T WAVE ABNORMALITY, CONSIDER INFERIOR ISCHEMIA  ABNORMAL ECG  NO PREVIOUS ECGS AVAILABLE  Confirmed by Talmadge Chad MD, SARA ([id number]) on 02/12/2021 4:14:43 PM          Critical care provider statement:     Critical care time  (minutes):  40    I have personally reviewed the patient's history and 24 hour interval events, along with vitals, labs, radiology images and nursing.     See above number of minutes of critical care time, excluding procedures, with high imminent risk for threat to life or limb with the following systems at risk:  Neurologic  Activities of critical care included:  Discussion with patient/family, Review of prior records, Review of results in the ED, Bedside evaluation and direction of care, and Close observation and re-evaluations    Critical care time was exclusive of separately billable procedures and treating other patients and teaching time.   Critical care was time spent personally by me on one or more of the following activities: discussions with consultants, development of treatment plan with patient or surrogate, evaluation of  patient's response to treatment, examination of patient, obtaining history from patient or surrogate, ordering and performing treatments and interventions, ordering and review of laboratory studies, ordering and review of radiographic studies, pulse oximetry, re-evaluation of patient's condition and review of old charts.   At least one organ system is acutely impaired.   There is a high probability of imminent, life-threatening deterioration.   I intervened to try to prevent further deterioration of the patient's condition.      SIGNED BY:     Tawni Pummel, MD    *This note was generated by the Epic EMR system/ Dragon speech recognition and may contain inherent errors or omissions not intended by the user. Grammatical errors, random word insertions, deletions, pronoun errors and incomplete sentences are occasional consequences of this technology due to software limitations. Not all errors are caught or corrected. If there are questions or concerns about the content of this note or information contained within the body of this dictation they should be addressed directly with the  author for clarification    *Due to pandemic of coronavirus and based on hospital policies and procedures ; this document may contain, but not limited to, information based on direct patient contact, observation , telephone conversation, use of A/V devices , medical records, conversation with other treatment team members, information and physical exam by other treatment team members.

## 2021-02-13 NOTE — Plan of Care (Signed)
Problem: Moderate/High Fall Risk Score >5  Goal: Patient will remain free of falls  Outcome: Progressing     Problem: Safety  Goal: Patient will be free from injury during hospitalization  Outcome: Progressing  Goal: Patient will be free from infection during hospitalization  Outcome: Progressing     Problem: Pain  Goal: Pain at adequate level as identified by patient  Outcome: Progressing     Problem: Side Effects from Pain Analgesia  Goal: Patient will experience minimal side effects of analgesic therapy  Outcome: Progressing     Problem: Discharge Barriers  Goal: Patient will be discharged home or other facility with appropriate resources  Outcome: Progressing     Problem: Psychosocial and Spiritual Needs  Goal: Demonstrates ability to cope with hospitalization/illness  Outcome: Progressing     Problem: Compromised Tissue integrity  Goal: Damaged tissue is healing and protected  Outcome: Progressing  Goal: Nutritional status is improving  Outcome: Progressing

## 2021-02-13 NOTE — PT Progress Note (Signed)
Physical Therapy Treatment  Patient: Yolanda Cox    ST03/ST03.A  Discharge Recommendations:   Based on today's session: Discharge Recommendation: SNF     If Discharge Recommendation: SNF is not available, then the patient will need LTC  If pt returns home, DME Recommended for Discharge: Front wheel walker;Wheelchair-manual;Hospital bed    Time of Treatment: Start Time: 1008 Stop Time: 1042 Time Calculation (min): 34 min      PT Visit Number: 2    Patient's medical condition is appropriate for Physical Therapy intervention at this time.    Precautions and Contraindications:   Precautions  Fall Risks: High;Impaired balance/gait;Impaired mobility    Assessment: Pt demonstrated limited progress towards PT goals during this session; no goals met on this date. Pt presents with weakness, decreased attention, decreased activity tolerance and impaired balance. Pt with increased right lateral trunk lean today when compared to presentation yesterday. Pt required max A for bed mobility, seated scooting, sit to stand transfers and to ambulate 2' to the bedside chair using RW. Pts caregiver, Joni Reining, was present throughout session and reports that this is a significant change from pts baseline functional mobility and cognitive status. Pt will benefit from continued skilled PT services to address these deficits.   Assessment: Decreased UE strength;Decreased LE strength;Decreased safety/judgement during functional mobility;Decreased cognition;Decreased endurance/activity tolerance;Decreased functional mobility;Decreased balance;Gait impairment Prognosis: Fair;With continued PT status post acute discharge   Progress: Slow progress, decreased activity tolerance     Patient Goal: none stated    Goals  Pt Will Go Supine To Sit: with supervision;to maximize functional mobility and independence;7 visits;Not met  Pt Will Perform Sit to Stand: with contact guard assist;to maximize functional mobility and independence;7 visits;Not  met  Pt Will Transfer Bed/Chair: with rolling walker;with contact guard assist;to maximize functional mobility and independence;7 visits;Not met  Pt Will Ambulate: 31-50 feet;with rolling walker;with minimal assist;to maximize functional mobility and independence;7 visits;Not met         Plan:   Continue with Physical Therapy services to address the above stated deficits. Focus next session on bed mobility, sitting balance, transfer training, gait training.  Treatment/Interventions: Exercise;Gait training;Neuromuscular re-education;Functional transfer training;LE strengthening/ROM;Endurance training;Bed mobility   PT Frequency: 3-4x/wk       Subjective: Patient is agreeable to participation in the therapy session. Nursing clears patient for therapy. "Joni Reining." Re when asked who was at the bedside   Pain Assessment  Pain Assessment: Numeric Scale (0-10)  Pain Score: 3-mild pain  Pain Location: Elbow  Pain Orientation: Left           Objective:  Observation of Patient/Vital Signs:  Patient is in bed with dressings, telemetry, heel protectors, peripheral IV, O2 at 2 liters/minute via nasal cannula, and female external catheter in place.    Vitals:  SpO2 on RA: 92-94%    Cognition/Neuro Status  Arousal/Alertness: Delayed responses to stimuli  Attention Span: Attends to task with redirection  Orientation Level: Oriented X4 (grossly A&Ox4, able to state it was November but unable to state current year, and aware that she had a fall)  Behavior: inattentive;calm;cooperative (lethargic, eyes half closed throughout majority of session)    Treatment Activities:   2 TA:  Functional Mobility  Supine to Sit: Maximal Assist;Increased Time;Increased Effort;HOB raised  Scooting to EOB: Maximal Assist;Increased Time;Increased Effort  Sit to Stand: Maximal Assist;Increased Time;Increased Effort;with instruction for hand placement to increase safety  Stand to Sit: Moderate Assist    Transfers  Bed to Chair: Maximal Assist  Device  Used  for Functional Transfer: front-wheeled walker    Locomotion  Ambulation: Maximal Assist;with front-wheeled walker  Pattern: R foot decreased clearance;L foot decreased clearance (pts caregiver, Joni Reining, at the bedside reports that she has difficulty clearing BLE while ambulating at baseline and tends to shuffle, non-slip socks limiting pts ability to shuffle and persistent max cueing required for pt to attempt to progress B LE)    Distance Walked (ft) (Step 6,7): 2 Feet    Gross Strength  Neck/Trunk Strength:  (poor trunk control while sitting EOB, listing to the right with right head rotation, fatigues quickly and only able to support self in upright posture for <15 seconds with max cueing and support)  Right Upper Extremity Strength: 3-/5 (grip strength slightly weaker compared to left but difficult to discern due to decreased participation)  Left Upper Extremity Strength: 3-/5  Right Lower Extremity Strength: 3/5 (knee extension MMT bilaterally was 4-/5, grossly 3/5)  Left Lower Extremity Strength: 3/5    Gross ROM  Right Upper Extremity ROM: needs focused assessment (grossly limited by weakness, unable to flex shoulder past 20 degrees)  Left Upper Extremity ROM: needs focused assessment (grossly limited by weakness, unable to flex shoulder past 20 degrees)  Right Lower Extremity ROM: within functional limits  Left Lower Extremity ROM: within functional limits                            Educated the patient to role of physical therapy, plan of care, goals of therapy and safety with mobility and ADLs.    Patient left without needs and call bell within reach. Chair Alarm set.  RN notified of session outcome.      Therapist PPE during session procedural mask and gloves     Richrd Humbles, PT, DPT

## 2021-02-13 NOTE — UM Notes (Signed)
[facility] utilization review   Please call Layah Skousen at [phone] (confidential voicemail only) main UR # [phone] or e-mail at [email or web address] with any questions.  Fax final authorization and request for additional information to [phone].    IP admission 02/12/21 for [facility]    Scheduled Meds:  Current Facility-Administered Medications   Medication Dose Route Frequency    atorvastatin  40 mg Oral QHS    calcium citrate-vitamin D  1 tablet Oral Daily    cefTRIAXone  1 g Intravenous Q24H    heparin (porcine)  5,000 Units Subcutaneous Q8H SCH    insulin lispro  1-4 Units Subcutaneous QHS    insulin lispro  1-8 Units Subcutaneous TID AC    lactobacillus/streptococcus  1 capsule Oral Daily    levETIRAcetam  500 mg Oral Q12H [facility]    levothyroxine  75 mcg Oral Daily at 0600    lidocaine  1 patch Transdermal Q24H    methocarbamol  500 mg Oral QID    senna-docusate  1 tablet Oral Q12H SCH    sertraline  50 mg Oral Daily     Continuous Infusions:   sodium chloride 50 mL/hr at 02/12/21 1746     PRN Meds:.acetaminophen, Nursing communication: Adult Hypoglycemia Treatment Algorithm **AND** glucagon (rDNA) **AND** dextrose **AND** dextrose **AND** dextrose, magnesium oxide **OR** magnesium sulfate, potassium chloride **OR** potassium chloride **OR** potassium chloride, sodium phosphate IVPB, sodium phosphate IVPB, sodium phosphate IVPB    Neurosurgery consult  Reason for Consultation:   tSAH     Assessment:   87 y.o. female w/ Hx anemia, fibromyalgia, HLD, hypothyroid, RA and TIA p/w AMS and head laceration after unwitnessed fall and found down. Head CT revealed small right frontal SAH without significant mass effect. On exam she is GCS 14 mildly confused but non focal neurologically. Stable overnight.         Plan:      Medical management per trauma  Ok to downgrade to Q 4 hour neuro checks  Daughter requested no heroic measures including surgical intervention as patient is DNR/DNI  No  indication for neurosurgical intervention   Keppra   Ok for chemical DVT ppx 11/15 am   STAT HCT for neuro exam decline  Outpatient follow up as needed

## 2021-02-14 DIAGNOSIS — F419 Anxiety disorder, unspecified: Secondary | ICD-10-CM

## 2021-02-14 DIAGNOSIS — S0101XA Laceration without foreign body of scalp, initial encounter: Secondary | ICD-10-CM

## 2021-02-14 DIAGNOSIS — E7849 Other hyperlipidemia: Secondary | ICD-10-CM

## 2021-02-14 DIAGNOSIS — R0689 Other abnormalities of breathing: Secondary | ICD-10-CM

## 2021-02-14 DIAGNOSIS — N390 Urinary tract infection, site not specified: Secondary | ICD-10-CM

## 2021-02-14 DIAGNOSIS — D464 Refractory anemia, unspecified: Secondary | ICD-10-CM

## 2021-02-14 DIAGNOSIS — I10 Essential (primary) hypertension: Secondary | ICD-10-CM

## 2021-02-14 DIAGNOSIS — B9689 Other specified bacterial agents as the cause of diseases classified elsewhere: Secondary | ICD-10-CM

## 2021-02-14 DIAGNOSIS — E119 Type 2 diabetes mellitus without complications: Secondary | ICD-10-CM

## 2021-02-14 LAB — GFR: EGFR: >60

## 2021-02-14 LAB — GLUCOSE WHOLE BLOOD - POCT
Whole Blood Glucose POCT: 177 mg/dL — ABNORMAL HIGH (ref 70–100)
Whole Blood Glucose POCT: 275 mg/dL — ABNORMAL HIGH (ref 70–100)
Whole Blood Glucose POCT: 166 mg/dL — ABNORMAL HIGH (ref 70–100)
Whole Blood Glucose POCT: 230 mg/dL — ABNORMAL HIGH (ref 70–100)

## 2021-02-14 LAB — BASIC METABOLIC PANEL
Anion Gap: 7 (ref 5.0–15.0)
BUN: 8 mg/dL (ref 7.0–21.0)
CO2: 22 mEq/L (ref 17–29)
Calcium: 8.4 mg/dL (ref 7.9–10.2)
Chloride: 107 mEq/L (ref 99–111)
Creatinine: 0.6 mg/dL (ref 0.4–1.0)
Glucose: 197 mg/dL — ABNORMAL HIGH (ref 70–100)
Potassium: 3.9 mEq/L (ref 3.5–5.3)
Sodium: 136 mEq/L (ref 135–145)

## 2021-02-14 LAB — MAGNESIUM: Magnesium: 1.9 mg/dL (ref 1.6–2.6)

## 2021-02-14 LAB — CBC
Absolute NRBC: 0.02 10*3/uL — ABNORMAL HIGH (ref 0.00–0.00)
Hematocrit: 24.8 % — ABNORMAL LOW (ref 34.7–43.7)
Hgb: 8.1 g/dL — ABNORMAL LOW (ref 11.4–14.8)
MCH: 31.8 pg (ref 25.1–33.5)
MCHC: 32.7 g/dL (ref 31.5–35.8)
MCV: 97.3 fL — ABNORMAL HIGH (ref 78.0–96.0)
MPV: 11.2 fL (ref 8.9–12.5)
Nucleated RBC: 0.1 /100 WBC — ABNORMAL HIGH (ref 0.0–0.0)
Platelets: 174 10*3/uL (ref 142–346)
RBC: 2.55 10*6/uL — ABNORMAL LOW (ref 3.90–5.10)
RDW: 17 % — ABNORMAL HIGH (ref 11–15)
WBC: 17.8 10*3/uL — ABNORMAL HIGH (ref 3.10–9.50)

## 2021-02-14 MED ORDER — CEFUROXIME AXETIL 250 MG PO TABS
250.0000 mg | ORAL_TABLET | Freq: Two times a day (BID) | ORAL | Status: DC
Start: 2021-02-14 — End: 2021-02-15
  Administered 2021-02-14 – 2021-02-15 (×2): 250 mg via ORAL
  Filled 2021-02-14 (×3): qty 1

## 2021-02-14 NOTE — Progress Notes (Signed)
TRAUMA and ACUTE CARE SURGERY PROGRESS NOTE          Date Time: 02/14/21 4:01 PM  Patient Name: Yolanda Cox  Attending Physician: Frutoso Schatz, MD  Type of Admission: Inpatient       ASSESSMENT/PLAN:   S/p fall with:    Right SAH  - No focal deficits. Moving all extremities w/o weakness.  - seen in consult by NSG team, no intervention recommended  - cont Q4 neuro checks  - home Keppra resumed  - Multimodal pain management w/ cautious use of narcotics     Left scalp laceration  - No active bleeding, scabbing present. No staples or sutures required  - Local wound care    Klebsiella UTI  - continued on Ceftriaxone. Cx shows pan-sensitive organism.   - Will change to Cefuroxime to complete 5 day course  - Leukocytosis continues to downtrend  - Pt is afebrile, non-toxic    Acute hypoxic respiratory insufficiency  - Likely r/t deconditioning and atelectasis  - On 3L NC  - Encourage pulmonary hygiene, IS, OOB    Chronic anemia  - H/H has remained stable on serial checks, above transfusion threshold  - Pt is asymptomatic and HD stable  - Trend H/H    DM  - SSI, medium dose  - No home meds reported    HTN  - Resume home amlodipine    HLD  - Resume home Lipitor    Hypothyroidism  - Resume home Synthroid    Rheumatoid arthritis  - Resume home weekly MTX (on Fridays)    Anxiety  - resume home sertraline    Dispo: Rec for SNF to go 11/17  SUBJECTIVE:   Pt caregiver at bedside. Reports no new or worsening pain. Denies HA, dizziness, CP, SOB, numbness or weakness.   MEDICATIONS:     Current Facility-Administered Medications   Medication Dose Route Frequency    acetaminophen  650 mg Oral 4 times per day    atorvastatin  40 mg Oral QHS    calcium citrate-vitamin D  1 tablet Oral Daily    cefTRIAXone  1 g Intravenous Q24H    donepezil  5 mg Oral QHS    enoxaparin  40 mg Subcutaneous Q24H SCH    insulin lispro  1-4 Units Subcutaneous QHS    insulin lispro  1-8 Units Subcutaneous TID AC     lactobacillus/streptococcus  1 capsule Oral Daily    levETIRAcetam  500 mg Oral Q12H [facility]    levothyroxine  75 mcg Oral Daily at 0600    lidocaine  1 patch Transdermal Q24H    methocarbamol  500 mg Oral QID    [START ON 02/16/2021] methotrexate  7.5 mg Oral Weekly    senna-docusate  1 tablet Oral Q12H SCH    sertraline  50 mg Oral Daily     PHYSICAL EXAM:     Vitals:    02/14/21 0006 02/14/21 0527 02/14/21 0843 02/14/21 1207   BP: 132/66 130/65 139/69 118/62   Pulse: 77 71 66 70   Resp: 16 16 15 17    Temp: 98.1 F (36.7 C) 98.1 F (36.7 C) 98.1 F (36.7 C) 98.2 F (36.8 C)   TempSrc: Oral Oral Oral Oral   SpO2: 98% 94% 97% 95%   Weight:       Height:           Physical Exam  Vitals reviewed.   Constitutional:       General: She is  not in acute distress.     Appearance: She is not toxic-appearing.   HENT:      Head: Normocephalic. Laceration (Scabbed over, no active bleeding) present.      Right Ear: External ear normal.      Left Ear: External ear normal.      Nose: Nose normal.   Eyes:      Extraocular Movements: Extraocular movements intact.      Conjunctiva/sclera: Conjunctivae normal.      Pupils: Pupils are equal, round, and reactive to light.   Cardiovascular:      Pulses: Normal pulses.   Pulmonary:      Effort: Pulmonary effort is normal. No respiratory distress.      Breath sounds: No wheezing.      Comments: No accessory muscle use. Symmetric expansion on inspiration.  Chest:      Chest wall: No tenderness.   Abdominal:      General: Abdomen is flat. There is no distension.      Palpations: Abdomen is soft.      Tenderness: There is no abdominal tenderness.   Musculoskeletal:      Comments: Moving all four extremities. Peripheral pulses palpable and 2+.    Skin:     General: Skin is warm.      Findings: No erythema or rash.   Neurological:      General: No focal deficit present.      Mental Status: She is alert.      Sensory: No sensory deficit.      Motor: No weakness.   Psychiatric:         Mood and  Affect: Mood normal.         Behavior: Behavior normal.       Patient Lines/Drains/Airways Status       Active PICC Line / CVC Line / PIV Line / Drain / Airway / Intraosseous Line / Epidural Line / ART Line / Line / Wound / Pressure Ulcer / NG/OG Tube       Name Placement date Placement time Site Days    Peripheral IV 02/12/21 18 G Standard Right Antecubital 02/12/21  0022  Antecubital  2    Peripheral IV 02/12/21 20 G Diffusion Left Antecubital 02/12/21  0038  Antecubital  2    External Urinary Catheter 02/12/21  0330  --  2    Wound 02/12/21 Moisture Associated Skin Damage (MASD) Perineum Other (Comment) blanchable redness 02/12/21  0330  Perineum  2                  Intake and Output Summary (Last 24 hours) at Date Time    Intake/Output Summary (Last 24 hours) at 02/14/2021 1601  Last data filed at 02/14/2021 0900  Gross per 24 hour   Intake 0 ml   Output 800 ml   Net -800 ml     LABS:     Results       Procedure Component Value Units Date/Time    Urine culture [[id number]] Collected: 02/12/21 0413    Specimen: Bladder Updated: 02/14/21 1431    Narrative:      ORDER#: [id number]                                    ORDERED BY: Francis Gaines  SOURCE: Urine  COLLECTED:  02/12/21 04:13  ANTIBIOTICS AT COLL.:                                RECEIVED :  02/12/21 07:36  Culture Urine                              PRELIM      02/14/21 14:31   +  02/13/21   Culture requires further incubation, results to follow             10,000 - 30,000 CFU/ML of normal urogenital or skin microbiota, no             further work  02/14/21   >100,000 CFU/ML Klebsiella pneumoniae               Further workup to follow including susceptibility testing    02/14/21   >100,000 CFU/ML Klebsiella pneumoniae               Further workup to follow including susceptibility testing             Second morphotype    _____________________________________________________________________________                                   K.pneumoniae    ANTIBIOTICS                     MIC  INTRP      _____________________________________________________________________________  Amoxicillin/CA                 <=4/2   S        Ampicillin                      >16    R  D1    Ampicillin/sulbactam            8/4    S        Aztreonam                       <=2    S        Cefazolin                        2     S  D2    Cefepime                        <=1    S        Cefoxitin                       <=4    S        Ceftazidime                     <=2    S        Ceftriaxone                     <=1    S  D3    Cefuroxime                      <=4    S  Ciprofloxacin                 <=0.25   S        Ertapenem                     <=0.25   S        Gentamicin                      <=2    S        Levofloxacin                   <=0.5   S        Meropenem                      <=0.5   S        Nitrofurantoin                  32     S  D4    Piperacillin/Tazobactam         4/4    S        Tetracycline                    >8     R        Trimethoprim/Sulfamethoxazole  >2/38   R          -----DRUG COMMENTS----------    D1:  If oral therapy is desired AND ampicillin is susceptible,         consider amoxicillin.         [place] Antimicrobial Subcommittee Nov. 2020    D2:  Cefazolin interpretation is for uncomplicated UTI only. If oral         therapy is desired for uncomplicated UTI AND K. pneumoniae is         susceptible to cefazolin, consider cephalexin, cefdinir,         cefpodoxime, or cefprozil.         [place] Antimicrobial Subcommittee Nov. 2020    D3:  Ceftriaxone does not predict cefdinir susceptibility.         [place] [facility] Subcommittee Nov. 2020    D4:  Nitrofurantoin should only be used for the treatment of         uncomplicated cystitis.         [place] [facility] Antimicrobial Subcommittee June 2015  _____________________________________________________________________________            S=SUSCEPTIBLE     I=INTERMEDIATE     R=RESISTANT                             N/S=NON-SUSCEPTIBLE  _____________________________________________________________________________      Glucose Whole Blood - POCT [[id number]]  (Abnormal) Collected: 02/14/21 1313     Updated: 02/14/21 1315     Whole Blood Glucose POCT 275 mg/dL     Glucose Whole Blood - POCT [[id number]]  (Abnormal) Collected: 02/14/21 0921     Updated: 02/14/21 0925     Whole Blood Glucose POCT 177 mg/dL     Basic Metabolic Panel [[id number]]  (Abnormal) Collected: 02/14/21 0328    Specimen: Blood Updated: 02/14/21 0402     Glucose 197 mg/dL      BUN 8.0 mg/dL      Creatinine 0.6 mg/dL      Calcium 8.4 mg/dL  Sodium 136 mEq/L      Potassium 3.9 mEq/L      Chloride 107 mEq/L      CO2 22 mEq/L      Anion Gap 7.0    Narrative:      Trauma    Magnesium [[id number]] Collected: 02/14/21 0328    Specimen: Blood Updated: 02/14/21 0402     Magnesium 1.9 mg/dL     Narrative:      Trauma    GFR [[id number]] Collected: 02/14/21 0328     Updated: 02/14/21 0402     EGFR >60.0       Narrative:      Trauma    CBC without differential [[id number]]  (Abnormal) Collected: 02/14/21 0328    Specimen: Blood Updated: 02/14/21 0344     WBC 17.80 x10 3/uL      Hgb 8.1 g/dL      Hematocrit 08.6 %      Platelets 174 x10 3/uL      RBC 2.55 x10 6/uL      MCV 97.3 fL      MCH 31.8 pg      MCHC 32.7 g/dL      RDW 17 %      MPV 11.2 fL      Nucleated RBC 0.1 /100 WBC      Absolute NRBC 0.02 x10 3/uL     Narrative:      Trauma    Glucose Whole Blood - POCT [[id number]]  (Abnormal) Collected: 02/13/21 2145     Updated: 02/13/21 2147     Whole Blood Glucose POCT 214 mg/dL     Culture Blood Aerobic and Anaerobic [[id number]] Collected: 02/12/21 1305    Specimen: Blood, Venipuncture Updated: 02/13/21 1721    Narrative:      The order will result in two separate 8-29ml bottles  Please do NOT order repeat blood cultures if one has been  drawn within the last 48 hours  UNLESS concerned for  endocarditis  AVOID BLOOD CULTURE DRAWS FROM CENTRAL LINE IF  POSSIBLE  Indications:->Bacteremia  ORDER#: [id number]                                    ORDERED BY: MEZAACHE, HINDA  SOURCE: Blood, Venipuncture left ac                  COLLECTED:  02/12/21 13:05  ANTIBIOTICS AT COLL.:                                RECEIVED :  02/12/21 16:39  Culture Blood Aerobic and Anaerobic        PRELIM      02/13/21 17:21  02/13/21   No Growth after 1 day/s of incubation.      Culture Blood Aerobic and Anaerobic [[id number]] Collected: 02/12/21 1305    Specimen: Blood, Venipuncture Updated: 02/13/21 1721    Narrative:      The order will result in two separate 8-35ml bottles  Please do NOT order repeat blood cultures if one has been  drawn within the last 48 hours  UNLESS concerned for  endocarditis  AVOID BLOOD CULTURE DRAWS FROM CENTRAL LINE IF POSSIBLE  Indications:->Bacteremia  ORDER#: [id number]  ORDERED BY: MEZAACHE, HINDA  SOURCE: Blood, Venipuncture LAC                      COLLECTED:  02/12/21 13:05  ANTIBIOTICS AT COLL.:                                RECEIVED :  02/12/21 16:39  Culture Blood Aerobic and Anaerobic        PRELIM      02/13/21 17:21  02/13/21   No Growth after 1 day/s of incubation.      Glucose Whole Blood - POCT [[id number]]  (Abnormal) Collected: 02/13/21 1611     Updated: 02/13/21 1621     Whole Blood Glucose POCT 194 mg/dL           RADS:   Radiological procedure personally reviewed:     No results found.  EKG Results       Procedure Component Value Units Date/Time    ECG 12 lead [[id number]] Collected: 02/12/21 0008     Updated: 02/12/21 1614     Ventricular Rate 117 BPM      Atrial Rate 83 BPM      P-R Interval -- ms      QRS Duration 96 ms      Q-T Interval 352 ms      QTC Calculation (Bezet) 491 ms      P Axis -- degrees      R Axis 125 degrees      T Axis -66 degrees      IHS MUSE NARRATIVE AND IMPRESSION --     ATRIAL FIBRILLATION WITH RAPID VENTRICULAR RESPONSE  LATERAL MYOCARDIAL INFARCTION , AGE UNDETERMINED  ST & T WAVE  ABNORMALITY, CONSIDER INFERIOR ISCHEMIA  ABNORMAL ECG  NO PREVIOUS ECGS AVAILABLE  Confirmed by Talmadge Chad MD, SARA ([id number]) on 02/12/2021 4:14:43 PM      Narrative:      ATRIAL FIBRILLATION WITH RAPID VENTRICULAR RESPONSE  LATERAL MYOCARDIAL INFARCTION , AGE UNDETERMINED  ST & T WAVE ABNORMALITY, CONSIDER INFERIOR ISCHEMIA  ABNORMAL ECG  NO PREVIOUS ECGS AVAILABLE  Confirmed by Talmadge Chad MD, SARA ([id number]) on 02/12/2021 4:14:43 PM            I have personally reviewed the patients history and 24 hour interval events, along with vitals, labs, radiology images and nursing. So far today I have spent 35 minutes providing care for this patient excluding teaching and billable procedures, and not overlapping with any other providers with greater than 50% of the time in counseling or coordination of care.     SIGNED BY:     Thana Ates, PA  [facility]  [phone]    All clinical findings, labs, imaging studies and management of Jaydence Vanyo. have been discussed in detail with attending Baltazar Apo, MD.    *This note was generated by the Epic EMR system/ Dragon speech recognition and may contain inherent errors or omissions not intended by the user. Grammatical errors, random word insertions, deletions, pronoun errors and incomplete sentences are occasional consequences of this technology due to software limitations. Not all errors are caught or corrected. If there are questions or concerns about the content of this note or information contained within the body of this dictation they should be addressed directly with the author for clarification    *Due to pandemic of coronavirus and based on hospital policies  and procedures ; this document may contain, but not limited to, information based on direct patient contact, observation , telephone conversation, use of A/V devices , medical records, conversation with other treatment team members, information and physical exam by other treatment  team members.

## 2021-02-14 NOTE — SLP Plan of Care Note (Signed)
Speech Language Pathology Cancellation Note    Patient: Yolanda Cox  ZOX:[id number]    Unit: [id number]/J4782.A    Patient not seen for speech language pathology therapy secondary to Pt without adequate alertness to participate in session tasks. SLP to complete evaluation when Pt can maintain adequate alertness.   Denton Meek, MS CCC-SLP

## 2021-02-14 NOTE — OT Progress Note (Signed)
Occupational Therapy Treatment  Patient: Yolanda Cox    [id number]  Discharge Recommendations:   Based on today's session: SNF     If SNF is not available, then the patient will need home health services, 24/7 supervision, assistance with mobility, and assistance with ADL's  If pt returns home, DME Recommended for Discharge: Wheelchair-manual;Hospital bed;BSC    Unit: [facility] [address] [facility]     MRN#:  [id number]    Time of treatment: Start Time: 1216 Stop Time: 1247   Time Calculation (min): 31 min    OT Visit Number: 2    Precautions and Contraindications:  Falls Risk             Assessment: Patient continues to present with the following deficits: decreased strength;balance deficits;decreased independence with ADLs;decreased safety awareness;decreased independence with IADLs;decreased endurance/activity tolerance;decreased cognition. Patient with fatigued appearance during the session requiring increase time and effort to perform all functional activities and transfers.  Patient would benefit from continued skilled OT treatment to maximize independence and safety with ADL's, functional transfers and mobility.      Prognosis: Fair;With continued OT s/p acute discharge  Progress: Slow progress, medical status limitations                                       Plan: Continue with Occupational therapy services in acute care to address increasing independence and safety with ADL's. Focus next therapy session on ADL retraining; functional transfer training; UB there. ex.         Treatment Interventions: ADL retraining;Functional transfer training;UE strengthening/ROM;Endurance training;Patient/Family training;Neuro muscular reeducation;Fine motor coordination activities  Risks/Benefits/POC Discussed with Pt/Family: With patient  OT Frequency Recommended: 2-3x/wk      Subjective: Patient's medical condition is appropriate for Occupational Therapy intervention at this time.  Patient  is agreeable to participation in the therapy session. Nursing clears patient for therapy. Patient's private caregiver present upon arrival reporting Patient is HOH'ing and does better with reading lips.  Pain Assessment  Pain Assessment: No/denies pain    Objective:  Observation of Patient/Vital Signs:  Patient is in bed with restraints, peripheral IV, O2  via nasal cannula, and female external catheter in place.    Cognition/Neuro Status  Arousal/Alertness: Delayed responses to stimuli  Attention Span: Attends to task with redirection  Orientation Level: Oriented to person;Oriented to place  Behavior: calm;cooperative;flat affect (fatigued appearance)    Functional Mobility  Rolling: Dependent;to left;to right  -Patient required verbal and tactile instructions to bend knee and to reach UE across chest to facilitate rolling to R and L sides  Scooting Transfers: Dependent to scoot hips forward towards EOB  Supine to Sit Transfers: Dependent  --Needed assistance for trunk and B/L LE mgmt to facilitate upright sitting at EOB; step by step instructions for sequencing  Sit to Stand Transfers: Maximal Assist (2 person assist for safety; from bed)  Stand to Sit Transfers: Maximal Assist (for eccentric control)  Bed to Chair Transfers: Maximal Assist (2 person assist for safety); Initiated weight-shifting from side to side in effort to facilitate stepping; Patient needed increased time and repetition of verbal/tactile instructions to weight shift towards the Left to safely complete transfer to arm-chair    Self-care and Home Management  Grooming: Supervision;supervision/safety;wash/dry face;in chair;setup  Toileting: Dependent;perineal hygiene;clothing management down;clothing management up (at bed level with rolling from R<-->L sides; Patient incontinent of bowel and  bladder and appeared unaware of same.)    Therapeutic Exercises:Patient guided thru and performed the following UB AROM exercises to faciltate increased  endurance and strength for ADL's, positioned bed with HOB elevated; 1 x 3 reps of each performed. Patient provided with verbal/visual instructions for proper pacing and execution of mvmts.     Shoulder AROM: Flexion;Extension;Sitting  Elbow AROM: Flexion;Extension  Hand ROM:  (gross digit F/E AROM)                             Treatment Activities: Patient sat at EOB for approx. 2-3 minutes with Min A to maintain EOB sitting in preparation for standing. Delayed thought processing and execution of mvmt noted during the session requiring constant repetition of instructions.Patient initially c/o feeling dizzy upon sitting at EOB; instructed Patient to initiate ankle pumps and within approx. 1 minute Patient reported dizziness symptoms were subsiding.  Patient educated in importance of OOB sitting for all meals to increase endurance/strength for activities and for pulmonary hygiene. Pt. Instructed to perform B/L UE AROM exercises for shoulder, elbow and digit F/E intermittently throughout the day to increase endurance and strength for ADL's with visual demonstration provided to increase clarity. Patient seated in arm-chair at end of session with all needs within reach. Patient instructed to ring for nursing for all needs. Chair Alarm activated for Patient's safety and RN notified of session outcome.   Educated the patient to role of occupational therapy, plan of care, goals of therapy and safety with mobility and ADLs.    Patient left without needs and call bell within reach. Chair Alarm set.  RN notified of session outcome.            Therapist PPE during session procedural mask, goggles , and gloves     Tennis Ship. Trixie Deis, MS,OTR/L  Pager # [phone]  [phone]

## 2021-02-14 NOTE — Plan of Care (Addendum)
Patient frequently drowsy but responds to voice. At times more alert. Sitting in chair today for lunch. Delayed in responses.     Problem: Moderate/High Fall Risk Score >5  Goal: Patient will remain free of falls  Flowsheets (Taken 02/14/2021 0900)  High (Greater than 13): HIGH-Bed alarm on at all times while patient in bed     Problem: Safety  Goal: Patient will be free from injury during hospitalization  Flowsheets (Taken 02/14/2021 1747)  Patient will be free from injury during hospitalization:   Assess patient's risk for falls and implement fall prevention plan of care per policy   Provide and maintain safe environment   Use appropriate transfer methods   Ensure appropriate safety devices are available at the bedside     Problem: Pain  Goal: Pain at adequate level as identified by patient  Flowsheets (Taken 02/14/2021 1747)  Pain at adequate level as identified by patient: Identify patient comfort function goal     Problem: Neurological Deficit  Goal: Neurological status is stable or improving  Flowsheets (Taken 02/14/2021 1747)  Neurological status is stable or improving: Monitor/assess/document neurological assessment (Stroke: every 4 hours)     Problem: Potential for Aspiration  Goal: Risk of aspiration will be minimized  Flowsheets (Taken 02/14/2021 1747)  Risk of aspiration will be minimized:   Assess and monitor ability to swallow   Place swallow precaution signage above bed   Dysphagia screen: Keep patient NPO if patient fails screening     Problem: Peripheral Neurovascular Impairment  Goal: Extremity color, movement, sensation are maintained or improved  Flowsheets (Taken 02/14/2021 1747)  Extremity color, movement, sensation are maintained or improved:   Increase mobility as tolerated/progressive mobility   Assess and monitor application of corrective devices (cast, brace, splint), check skin integrity     Problem: Inadequate Gas Exchange  Goal: Adequate oxygenation and improved ventilation  Flowsheets  (Taken 02/14/2021 1747)  Adequate oxygenation and improved ventilation:   Assess lung sounds   Monitor SpO2 and treat as needed   Provide mechanical and oxygen support to facilitate gas exchange

## 2021-02-14 NOTE — PT Progress Note (Signed)
Physical Therapy Treatment  Patient: Yolanda Cox    [id number]  Discharge Recommendations:   Based on today's session: SNF     If SNF is not available, then the patient will need home health services, increase supervision, assistance with mobility, and assistance with ADL's  If pt returns home,  RW, [facility]    Unit: [facility]     Time of Treatment: Start Time: 1534 Stop Time: 1558 Time Calculation: 24 min  PT Visit Number: 3    Patients medical condition is appropriate for Physical Therapy intervention at this time.    Precautions:  Fall Risks - High;Impaired mobility;Impaired balance/gait;Muscle weakness      Assessment: Appears very tired in spite of being asleep majority of the day, per daughter this is typical for pt due to having so many changes in setting.  Unsteady gait/ poor ambulation balance, with history of falls and general weakness that compromises patient and caregiver/family safety. Pt will benefit from continued in patient rehab to address the following deficits: Decreased safety/judgement during functional mobility;Decreased endurance/activity tolerance;Decreased functional mobility;Decreased balance;Decreased LE strength;Decreased UE strength;Gait impairment.  Prognosis: Fair;With continued PT status post acute discharge   Progress: Slow progress, decreased activity tolerance   Patient Goal: none stated    Goals  Pt Will Go Supine To Sit: with supervision;to maximize functional mobility and independence;7 visits;Not met  Pt Will Perform Sit to Stand: with contact guard assist;to maximize functional mobility and independence;7 visits;Not met  Pt Will Transfer Bed/Chair: with rolling walker;with contact guard assist;to maximize functional mobility and independence;7 visits;Not met  Pt Will Ambulate: 31-50 feet;with rolling walker;with minimal assist;to maximize functional mobility and independence;7 visits;Not met       Plan: Continue with  Physical Therapy services to address functional mobility, endurance, and strength deficits.   Treatment/Interventions: Bed mobility;Functional transfer training;LE strengthening/ROM;Exercise;Patient/family training;Equipment eval/education;Endurance training;Gait training   PT Frequency: 3-4x/wk       Subjective: Patient is agreeable to participation in the therapy session, complained of pain with movement due to skin breakdown on sacral area and perianal area. Daughter Yolanda Cox present and agreeable to patient's participation in the therapy session, informed therapist patient has complained to her of her bottom hurting due to 'diaper rash" as pt does not change her briefs at home as often as she should, in spite of CG and daughter encouraging her to do so. Nursing clears patient for therapy.   Pain Assessment: Numeric Scale (0-10)       Objective: Observation of Patient/Vital Signs: Reviewed patients chart and vital signs being monitored by nursing staff, appear to be within acceptable ranges and therapeutic parameters for Physical Therapy to proceed. Patient is seated in a bedside chair with O2 via nasal cannula and female external catheter in place. Patient seen for functional activities and exercises as noted:      Cognition/Neuro Status  Arousal/Alertness: Delayed responses to stimuli  Attention Span: Attends to task with redirection  Orientation Level: Oriented to person  Following Commands: Follows one step commands with increased time and repetition;50%  Comments: very lethargic, pts daughter Yolanda Cox reports that she typically sleeps 12 hours and any change in her routine is "a lot for pt to handle  Behavior: cooperative;calm;flat affect    Functional Mobility  Rolling: Moderate Assist;to Left;to Right  Scooting to HOB: Dependent  Sit to Supine: Dependent  Sit to Stand: Moderate Assist;with instruction for hand placement to increase safety;Increased Time;Increased Effort  Stand  to Sit: Moderate Assist (poor  eccentric control, not reaching back with UEs due to sitting before instructions fully given)     Locomotion  Ambulation: Moderate Assist;with front-wheeled walker  Pattern: decreased cadence;decreased step length;R foot decreased clearance;L foot decreased clearance, constant instructions for step sequence and step length, and assistance to maneuver the RW.  Distance Walked (Step 6,7): 10 Feet     Neuro Re-Ed  Sitting Balance: contact guard assist front edge of chair and sitting EOB  Standing Balance: moderate assist;with support;with instruction     Treatment Activities: Functional mobility and transfers, balance assessment, LE ROM and strength ex's, safety awareness and cognition assessed, and activity tolerance noted.  Patient was instructed in frequent ankle pumps and LE movement for circulation and to preserve strength      Educated the patient to role of physical therapy, plan of care, goals of therapy.  Patient left reclined in bed, without needs and call bell within reach. Bed alarm set, daughter present.  RN notified of session outcome.        Therapist PPE during session procedural mask and gloves

## 2021-02-14 NOTE — Progress Notes (Signed)
CM Progress note:     D/C Disposition: [facility]  Anticipated D/C Date: 02/15/2021    Patient to discharge to [facility]. Spoke with admissions liaison at [facility] who agrees to accept patient and has a bed available for patient once cleared for discharge.      Lauro Franklin RN Case Manager  [facility]  [phone]

## 2021-02-15 DIAGNOSIS — D72829 Elevated white blood cell count, unspecified: Secondary | ICD-10-CM

## 2021-02-15 LAB — CBC
Absolute NRBC: 0.02 10*3/uL — ABNORMAL HIGH (ref 0.00–0.00)
Hematocrit: 26 % — ABNORMAL LOW (ref 34.7–43.7)
Hgb: 8.3 g/dL — ABNORMAL LOW (ref 11.4–14.8)
MCH: 31.6 pg (ref 25.1–33.5)
MCHC: 31.9 g/dL (ref 31.5–35.8)
MCV: 98.9 fL — ABNORMAL HIGH (ref 78.0–96.0)
MPV: 10.9 fL (ref 8.9–12.5)
Nucleated RBC: 0.2 /100 WBC — ABNORMAL HIGH (ref 0.0–0.0)
Platelets: 191 10*3/uL (ref 142–346)
RBC: 2.63 10*6/uL — ABNORMAL LOW (ref 3.90–5.10)
RDW: 17 % — ABNORMAL HIGH (ref 11–15)
WBC: 11.64 10*3/uL — ABNORMAL HIGH (ref 3.10–9.50)

## 2021-02-15 LAB — COVID-19 (SARS-COV-2): SARS CoV 2 Overall Result: NOT DETECTED

## 2021-02-15 LAB — GFR: EGFR: >60

## 2021-02-15 LAB — BASIC METABOLIC PANEL
Anion Gap: 8 (ref 5.0–15.0)
BUN: 9 mg/dL (ref 7.0–21.0)
CO2: 23 mEq/L (ref 17–29)
Calcium: 8.6 mg/dL (ref 7.9–10.2)
Chloride: 107 mEq/L (ref 99–111)
Creatinine: 0.7 mg/dL (ref 0.4–1.0)
Glucose: 197 mg/dL — ABNORMAL HIGH (ref 70–100)
Potassium: 4 mEq/L (ref 3.5–5.3)
Sodium: 138 mEq/L (ref 135–145)

## 2021-02-15 LAB — GLUCOSE WHOLE BLOOD - POCT
Whole Blood Glucose POCT: 171 mg/dL — ABNORMAL HIGH (ref 70–100)
Whole Blood Glucose POCT: 247 mg/dL — ABNORMAL HIGH (ref 70–100)

## 2021-02-15 LAB — MAGNESIUM: Magnesium: 1.8 mg/dL (ref 1.6–2.6)

## 2021-02-15 MED ORDER — DONEPEZIL HCL 5 MG PO TABS
5.0000 mg | ORAL_TABLET | Freq: Every evening | ORAL | Status: AC
Start: 2021-02-15 — End: ?

## 2021-02-15 MED ORDER — CALCIUM CITRATE-VITAMIN D 315-6.25 MG-MCG PO TABS
1.0000 | ORAL_TABLET | Freq: Every day | ORAL | Status: AC
Start: 2021-02-16 — End: ?

## 2021-02-15 MED ORDER — RISAQUAD PO CAPS
1.0000 | ORAL_CAPSULE | Freq: Every day | ORAL | Status: AC
Start: 2021-02-16 — End: ?

## 2021-02-15 MED ORDER — METHOTREXATE SODIUM 7.5 MG PO TABS
7.5000 mg | ORAL_TABLET | ORAL | Status: AC
Start: 2021-02-16 — End: ?

## 2021-02-15 MED ORDER — ACETAMINOPHEN 325 MG PO TABS
650.0000 mg | ORAL_TABLET | Freq: Four times a day (QID) | ORAL | Status: AC
Start: 2021-02-15 — End: ?

## 2021-02-15 MED ORDER — LEVOTHYROXINE SODIUM 75 MCG PO TABS
75.0000 ug | ORAL_TABLET | Freq: Every day | ORAL | Status: AC
Start: 2021-02-16 — End: ?

## 2021-02-15 MED ORDER — CEFUROXIME AXETIL 250 MG PO TABS
250.0000 mg | ORAL_TABLET | Freq: Two times a day (BID) | ORAL | 0 refills | Status: AC
Start: 2021-02-15 — End: 2021-02-16

## 2021-02-15 MED ORDER — METHOCARBAMOL 500 MG PO TABS
500.0000 mg | ORAL_TABLET | Freq: Four times a day (QID) | ORAL | Status: AC | PRN
Start: 2021-02-15 — End: ?

## 2021-02-15 MED ORDER — SENNOSIDES-DOCUSATE SODIUM 8.6-50 MG PO TABS
1.0000 | ORAL_TABLET | Freq: Two times a day (BID) | ORAL | Status: AC
Start: 2021-02-15 — End: ?

## 2021-02-15 NOTE — Plan of Care (Signed)
Problem: Moderate/High Fall Risk Score >5  Goal: Patient will remain free of falls  Outcome: Progressing  Flowsheets  Taken 02/15/2021 0427  VH High Risk (Greater than 13):   ALL REQUIRED LOW INTERVENTIONS   ALL REQUIRED MODERATE INTERVENTIONS   RED "HIGH FALL RISK" SIGNAGE   BED ALARM WILL BE ACTIVATED WHEN THE PATEINT IS IN BED WITH SIGNAGE "RESET BED ALARM"   PATIENT IS TO BE SUPERVISED FOR ALL TOILETING ACTIVITIES  Taken 02/14/2021 2000  High (Greater than 13):   HIGH-Visual cue at entrance to patient's room   HIGH-Bed alarm on at all times while patient in bed   HIGH-Apply yellow "Fall Risk" arm band   HIGH-Consider use of low bed     Problem: Safety  Goal: Patient will be free from injury during hospitalization  Outcome: Progressing  Flowsheets (Taken 02/15/2021 0427)  Patient will be free from injury during hospitalization:   Assess patient's risk for falls and implement fall prevention plan of care per policy   Use appropriate transfer methods   Provide and maintain safe environment   Ensure appropriate safety devices are available at the bedside   Hourly rounding   Include patient/ family/ care giver in decisions related to safety  Goal: Patient will be free from infection during hospitalization  Outcome: Progressing     Problem: Pain  Goal: Pain at adequate level as identified by patient  Outcome: Progressing     Problem: Side Effects from Pain Analgesia  Goal: Patient will experience minimal side effects of analgesic therapy  Outcome: Progressing  Flowsheets (Taken 02/15/2021 0427)  Patient will experience minimal side effects of analgesic therapy:   Monitor/assess patient's respiratory status (RR depth, effort, breath sounds)   Assess for changes in cognitive function   Prevent/manage side effects per LIP orders (i.e. nausea, vomiting, pruritus, constipation, urinary retention, etc.)     Problem: Discharge Barriers  Goal: Patient will be discharged home or other facility with appropriate  resources  Outcome: Progressing     Problem: Psychosocial and Spiritual Needs  Goal: Demonstrates ability to cope with hospitalization/illness  Outcome: Progressing     Problem: Compromised Tissue integrity  Goal: Damaged tissue is healing and protected  Outcome: Progressing  Goal: Nutritional status is improving  Outcome: Progressing  Flowsheets (Taken 02/15/2021 0427)  Nutritional status is improving:   Assist patient with eating   Allow adequate time for meals   Encourage patient to take dietary supplement(s) as ordered     Problem: Neurological Deficit  Goal: Neurological status is stable or improving  Outcome: Progressing     Problem: Potential for Aspiration  Goal: Risk of aspiration will be minimized  Outcome: Progressing  Flowsheets (Taken 02/15/2021 0427)  Risk of aspiration will be minimized:   Dysphagia screen: Keep patient NPO if patient fails screening   Place swallow precaution signage above bed   Monitor/assess for signs of aspiration (tachypnea, cough, wheezing, clearing throat, hoarseness after eating, decrease in SaO2)     Problem: Peripheral Neurovascular Impairment  Goal: Extremity color, movement, sensation are maintained or improved  Outcome: Progressing     Problem: Compromised Hemodynamic Status  Goal: Vital signs and fluid balance maintained/improved  Outcome: Progressing  Flowsheets (Taken 02/15/2021 0427)  Vital signs and fluid balance are maintained/improved:   Position patient for maximum circulation/cardiac output   Monitor/assess vitals and hemodynamic parameters with position changes     Problem: Inadequate Gas Exchange  Goal: Adequate oxygenation and improved ventilation  Outcome: Progressing  Goal:  Patent Airway maintained  Outcome: Progressing  Flowsheets (Taken 02/15/2021 0427)  Patent airway maintained:   Position patient for maximum ventilatory efficiency   Provide adequate fluid intake to liquefy secretions

## 2021-02-15 NOTE — Discharge Summary (Cosign Needed)
Discharge Summary    Date:02/15/2021   Patient Name: Yolanda Cox  Attending Physician: Frutoso Schatz, MD    Date of Admission:   02/11/2021  Date of Discharge:   02/15/2021  Admitting Diagnosis:     1. Fall, initial encounter        2. SAH (subarachnoid hemorrhage)        3. Hematoma of scalp, initial encounter        4. Concussion with unknown loss of consciousness status, initial encounter        5. Leukocytosis, unspecified type           Discharge Dx:   Principal Diagnosis (Diagnosis after study, that is chiefly responsible for admission to inpatient status): SAH (subarachnoid hemorrhage)  Active Hospital Problems    Diagnosis POA    Principal Problem: SAH (subarachnoid hemorrhage) Yes    Scalp laceration Unknown    UTI due to Klebsiella species Unknown    Acute respiratory insufficiency Unknown    RA (refractory anemia) Unknown    Type 2 diabetes mellitus without complication, without long-term current use of insulin Unknown    Primary hypertension Unknown    Other hyperlipidemia Unknown    Anxiety Unknown    Disorder of thyroid Yes     Chronic    Fall, initial encounter Yes      Resolved Hospital Problems   No resolved problems to display.     Treatment Team:   Treatment Team:   Attending Provider: Frutoso Schatz, MD  Consulting Physician: Imagene Sheller, MD   Procedures performed:   Radiology: all results from this admission  XR Humerus Left AP Lateral    Result Date: 02/12/2021  Left elbow and left humerus: 1. No definite fracture of the left elbow. Small elbow joint effusion. If there is persistent clinical concern for an occult/nondisplaced fracture, consider correlation with MRI or CT, as clinically warranted. 2. Superficial soft tissue gas overlying the ulna, likely corresponding to site of recent soft tissue injury. 3. No fracture of the left humerus. Fredrich Birks, MD  02/12/2021 9:41 PM    XR Elbow Left AP And Lateral    Result Date: 02/12/2021  Left elbow and left humerus:  1. No definite fracture of the left elbow. Small elbow joint effusion. If there is persistent clinical concern for an occult/nondisplaced fracture, consider correlation with MRI or CT, as clinically warranted. 2. Superficial soft tissue gas overlying the ulna, likely corresponding to site of recent soft tissue injury. 3. No fracture of the left humerus. Fredrich Birks, MD  02/12/2021 9:41 PM    CT Head WO Contrast    Result Date: 02/12/2021   1.  Small amount of traumatic type subarachnoid hemorrhage is noted in a sulcus overlying the right frontal convexity. 2.  There is no hydrocephalus, herniation, midline shift or other significant mass effect. 3.   A left parietal scalp injury is noted without underlying fracture. 4.   Critical findings were discussed with, and read back by, Dr. Randa Lynn at the following time: 02/12/2021 12:10 AM. Miguel Dibble, MD  02/12/2021 12:15 AM    CT Cervical Spine without Contrast    Result Date: 02/12/2021   No acute fracture. Theador Hawthorne, MD  02/12/2021 12:13 AM    XR Chest  AP Portable    Result Date: 02/12/2021  No acute abnormality. Theador Hawthorne, MD  02/12/2021 12:30 AM    Pelvis Portable    Result Date: 02/12/2021  1.  No definite acute  abnormality. No fracture or dislocation is seen. 2.   If the patient is unable to bear weight, CT is recommended for further evaluation. 3.   Degenerative changes are noted in the spine and hips. Miguel Dibble, MD  02/12/2021 12:31 AM   Surgery: all results from this admission  * No surgery found *  Reason for Admission:     Chief Complaint   Patient presents with    [facility] Injury      Hospital Course:     The patient was admitted and treated for the following. Please see HPI from H and P for more details.    S/p fall with:     Right SAH  - No focal deficits. Moving all extremities w/o weakness.  - seen in consult by NSG team, no intervention recommended  - home Keppra resumed  - Multimodal pain management w/ cautious use of narcotics      Left scalp  laceration  - No active bleeding, scabbing present. No staples or sutures required  - Local wound care     Klebsiella UTI  - continued on Ceftriaxone. Cx shows pan-sensitive organism.   - Will change to Cefuroxime to end 11/18   - Leukocytosis continues to downtrend  - Pt is afebrile, non-toxic     Acute hypoxic respiratory insufficiency  - Likely r/t deconditioning and atelectasis  - On 2L NC  - Encourage pulmonary hygiene, IS, OOB     Chronic anemia  - H/H has remained stable on serial checks, above transfusion threshold  - Pt is asymptomatic and HD stable  - Trend H/H     DM  - SSI, medium dose  - No home meds reported     HTN  - Resume home amlodipine     HLD  - Resume home Lipitor     Hypothyroidism  - Resume home Synthroid     Rheumatoid arthritis  - Resume home weekly MTX (on Fridays)     Anxiety  - resume home sertraline    Condition at Discharge:   Stable  Today:     BP 138/61   Pulse 66   Temp 98.4 F (36.9 C) (Oral)   Resp 18   Ht 1.626 m (5\' 4" )   Wt 63.7 kg (140 lb 6.9 oz)   SpO2 95%   BMI 24.11 kg/m   Ranges for the last 24 hours:  Temp:  [97.7 F (36.5 C)-99.9 F (37.7 C)] 98.4 F (36.9 C)  Heart Rate:  [61-75] 66  Resp Rate:  [16-18] 18  BP: (138-144)/(60-73) 138/61  Physical Exam   Nursing note and vitals reviewed.   Constitutional: Pt is oriented to person and place. Pt appears well-developed and well-nourished.   HENT:     Head: Normocephalic and atraumatic.     Right Ear: External ear normal.     Left Ear: External ear normal.     Mouth/Throat: Moist mucous membranes. Uvula is midline. Oropharynx is clear. No oropharyngeal exudate.  Eyes: Conjunctivae normal. Pupils are equal, round, and reactive to light.   Neck: Normal range of motion.   Cardiovascular: Regular rate and rhythm. No murmur/rub/gallop. No pedal edema  Pulmonary/Chest: Clear, diminished breath sounds throughout. No respiratory distress. O2 sat 95% on 2L NC.   Abdominal: Bowel sounds are normal. NT/ND. No hepato or  splenomegaly  Pelvic/GU: Pelvis stable  MSK: MAEW. Palpable pulses in all 4 extremities. Capillary refill <2 seconds. PMS intact. No cyanosis or clubbing  of the digits.  Neurological: Pt is alert and oriented to person and place. GCS 14. Normal speech.  Skin: Skin is warm and dry. Normal skin turgor.   Psychiatric: Flat affect. Behavior is normal.     Last set of labs   Results       Procedure Component Value Units Date/Time    Glucose Whole Blood - POCT [[id number]]  (Abnormal) Collected: 02/15/21 1156     Updated: 02/15/21 1203     Whole Blood Glucose POCT 247 mg/dL     Glucose Whole Blood - POCT [[id number]]  (Abnormal) Collected: 02/15/21 0806     Updated: 02/15/21 1121     Whole Blood Glucose POCT 171 mg/dL     Basic Metabolic Panel [[id number]]  (Abnormal) Collected: 02/15/21 0331    Specimen: Blood Updated: 02/15/21 0405     Glucose 197 mg/dL      BUN 9.0 mg/dL      Creatinine 0.7 mg/dL      Calcium 8.6 mg/dL      Sodium 413 mEq/L      Potassium 4.0 mEq/L      Chloride 107 mEq/L      CO2 23 mEq/L      Anion Gap 8.0    Narrative:      Trauma    Magnesium [[id number]] Collected: 02/15/21 0331    Specimen: Blood Updated: 02/15/21 0405     Magnesium 1.8 mg/dL     Narrative:      Trauma    GFR [[id number]] Collected: 02/15/21 0331     Updated: 02/15/21 0405     EGFR >60.0       Narrative:      Trauma    CBC without differential [[id number]]  (Abnormal) Collected: 02/15/21 0331    Specimen: Blood Updated: 02/15/21 0342     WBC 11.64 x10 3/uL      Hgb 8.3 g/dL      Hematocrit 75.6 %      Platelets 191 x10 3/uL      RBC 2.63 x10 6/uL      MCV 98.9 fL      MCH 31.6 pg      MCHC 31.9 g/dL      RDW 17 %      MPV 10.9 fL      Nucleated RBC 0.2 /100 WBC      Absolute NRBC 0.02 x10 3/uL     Narrative:      Trauma    Glucose Whole Blood - POCT [[id number]]  (Abnormal) Collected: 02/14/21 2119     Updated: 02/14/21 2122     Whole Blood Glucose POCT 230 mg/dL     Glucose Whole Blood - POCT [[id number]]  (Abnormal) Collected: 02/14/21  1751     Updated: 02/14/21 1753     Whole Blood Glucose POCT 166 mg/dL     Culture Blood Aerobic and Anaerobic [[id number]] Collected: 02/12/21 1305    Specimen: Blood, Venipuncture Updated: 02/14/21 1721    Narrative:      The order will result in two separate 8-46ml bottles  Please do NOT order repeat blood cultures if one has been  drawn within the last 48 hours  UNLESS concerned for  endocarditis  AVOID BLOOD CULTURE DRAWS FROM CENTRAL LINE IF POSSIBLE  Indications:->Bacteremia  ORDER#: [id number]  ORDERED BY: MEZAACHE, HINDA  SOURCE: Blood, Venipuncture left ac                  COLLECTED:  02/12/21 13:05  ANTIBIOTICS AT COLL.:                                RECEIVED :  02/12/21 16:39  Culture Blood Aerobic and Anaerobic        PRELIM      02/14/21 17:21  02/13/21   No Growth after 1 day/s of incubation.  02/14/21   No Growth after 2 day/s of incubation.      Culture Blood Aerobic and Anaerobic [[id number]] Collected: 02/12/21 1305    Specimen: Blood, Venipuncture Updated: 02/14/21 1721    Narrative:      The order will result in two separate 8-69ml bottles  Please do NOT order repeat blood cultures if one has been  drawn within the last 48 hours  UNLESS concerned for  endocarditis  AVOID BLOOD CULTURE DRAWS FROM CENTRAL LINE IF POSSIBLE  Indications:->Bacteremia  ORDER#: [id number]                                    ORDERED BY: MEZAACHE, HINDA  SOURCE: Blood, Venipuncture LAC                      COLLECTED:  02/12/21 13:05  ANTIBIOTICS AT COLL.:                                RECEIVED :  02/12/21 16:39  Culture Blood Aerobic and Anaerobic        PRELIM      02/14/21 17:21  02/13/21   No Growth after 1 day/s of incubation.  02/14/21   No Growth after 2 day/s of incubation.      Urine culture [[id number]] Collected: 02/12/21 0413    Specimen: Bladder Updated: 02/14/21 1431    Narrative:      ORDER#: [id number]                                    ORDERED BY: Francis Gaines  SOURCE: Urine                                         COLLECTED:  02/12/21 04:13  ANTIBIOTICS AT COLL.:                                RECEIVED :  02/12/21 07:36  Culture Urine                              PRELIM      02/14/21 14:31   +  02/13/21   Culture requires further incubation, results to follow             10,000 - 30,000 CFU/ML of normal urogenital or skin microbiota, no             further work  02/14/21   >100,000 CFU/ML  Klebsiella pneumoniae               Further workup to follow including susceptibility testing    02/14/21   >100,000 CFU/ML Klebsiella pneumoniae               Further workup to follow including susceptibility testing             Second morphotype    _____________________________________________________________________________                                  K.pneumoniae    ANTIBIOTICS                     MIC  INTRP      _____________________________________________________________________________  Amoxicillin/CA                 <=4/2   S        Ampicillin                      >16    R  D1    Ampicillin/sulbactam            8/4    S        Aztreonam                       <=2    S        Cefazolin                        2     S  D2    Cefepime                        <=1    S        Cefoxitin                       <=4    S        Ceftazidime                     <=2    S        Ceftriaxone                     <=1    S  D3    Cefuroxime                      <=4    S        Ciprofloxacin                 <=0.25   S        Ertapenem                     <=0.25   S        Gentamicin                      <=2    S        Levofloxacin                   <=0.5   S        Meropenem                      <=  0.5   S        Nitrofurantoin                  32     S  D4    Piperacillin/Tazobactam         4/4    S        Tetracycline                    >8     R        Trimethoprim/Sulfamethoxazole  >2/38   R          -----DRUG COMMENTS----------    D1:  If oral therapy is desired AND ampicillin is susceptible,          consider amoxicillin.         [place] [facility]imicrobial Subcommittee Nov. 2020    D2:  Cefazolin interpretation is for uncomplicated UTI only. If oral         therapy is desired for uncomplicated UTI AND K. pneumoniae is         susceptible to cefazolin, consider cephalexin, cefdinir,         cefpodoxime, or cefprozil.         [place] Antimicrobial Subcommittee Nov. 2020    D3:  Ceftriaxone does not predict cefdinir susceptibility.         [place] Antimicrobial Subcommittee Nov. 2020    D4:  Nitrofurantoin should only be used for the treatment of         uncomplicated cystitis.         [facility] [facility] Antimicrobial Subcommittee June 2015  _____________________________________________________________________________            S=SUSCEPTIBLE     I=INTERMEDIATE     R=RESISTANT                            N/S=NON-SUSCEPTIBLE  _____________________________________________________________________________              Micro / Labs / Path pending:     Unresulted Labs       None          Stroke Measures:  Patient does not meet a need for further explanation of Reasons of treatment.      Discharge Instructions:      Contact information for follow-up providers       Ro, Saunders Glance, MD .    Specialty: Internal Medicine  Contact information:  [address] [place] [address]  [phone]                       Contact information for after-discharge care       Discharge Placement       [facility]) .    Service: Skilled Nursing  Contact information:  [address] [place] [address]  [phone]                                 Discharge Diet: Consistent Carbohydrates  Wound 02/12/21 Moisture Associated Skin Damage (MASD) Perineum Other (Comment) blanchable redness (Active)   Date First Assessed/Time First Assessed: 02/12/21 0330   Wound Type: Moisture Associated Skin Damage (MASD)  Location: Perineum  Wound Location Orientation: (c) Other (Comment)  Wound Description (Comments): blanchable  redness  Present on Admission: Yes  Assessments 02/12/2021  3:30 AM 02/14/2021  8:00 PM   Site Description Red Red   Peri-wound Description Red;Purple Red   Closure Open to air --   Drainage Amount -- None   Drainage Description Other (Comment) --   Treatments Cleansed --   Dressing Barrier Film Open to air       No Linked orders to display      Discharge References/Attachments    None       Disposition:  Home-Health Care Svc     Discharge Medication List        Taking      acetaminophen 325 MG tablet  Dose: 650 mg  What changed:   when to take this  reasons to take this  Commonly known as: TYLENOL  Take 2 tablets (650 mg) by mouth every 6 (six) hours     amLODIPine 10 MG tablet  Dose: 10 mg  Commonly known as: NORVASC  Take 1 tablet (10 mg total) by mouth daily     atorvastatin 40 MG tablet  Dose: 40 mg  Commonly known as: LIPITOR  Take 1 tablet (40 mg total) by mouth nightly     calcium citrate-vitamin D 315-6.25 MG-MCG Tabs  Dose: 1 tablet  Commonly known as: CITRACAL+D  Start taking on: February 16, 2021  Take 1 tablet by mouth daily  Replaces: calcium carbonate 600 MG Tabs tablet     cefuroxime 250 MG tablet  Dose: 250 mg  Commonly known as: CEFTIN  Take 1 tablet (250 mg) by mouth every 12 (twelve) hours for 1 day     donepezil 5 MG tablet  Dose: 5 mg  Commonly known as: ARICEPT  Take 1 tablet (5 mg) by mouth nightly     folic acid 1 MG tablet  Dose: 1 mg  Commonly known as: FOLVITE  Take 1 tablet (1 mg) by mouth daily     lactobacillus/streptococcus Caps  Dose: 1 capsule  Start taking on: February 16, 2021  Take 1 capsule by mouth daily     levETIRAcetam 500 MG tablet  Dose: 500 mg  Commonly known as: KEPPRA  Take 1 tablet (500 mg total) by mouth every 12 (twelve) hours     levothyroxine 75 MCG tablet  Dose: 75 mcg  Commonly known as: SYNTHROID  Start taking on: February 16, 2021  Take 1 tablet (75 mcg) by mouth Once a day at 6:00am     methocarbamol 500 MG tablet  Dose: 500 mg  Commonly known as:  ROBAXIN  Take 1 tablet (500 mg) by mouth 4 (four) times daily as needed (as needed for muscle spasm)     methotrexate 7.5 MG tablet  Dose: 7.5 mg  What changed:   medication strength  how much to take  For: Rheumatoid Arthritis  Start taking on: February 16, 2021  Take 1 tablet (7.5 mg) by mouth once a week     multivitamin capsule  Dose: 1 capsule  Take 1 capsule by mouth daily     OMEGA-3 FISH OIL PO  Dose: 1,000 mg  Take 1,000 mg by mouth daily     ondansetron 4 MG disintegrating tablet  Dose: 4 mg  Commonly known as: ZOFRAN-ODT  Take 1 tablet (4 mg total) by mouth every 8 (eight) hours as needed for Nausea     senna-docusate 8.6-50 MG per tablet  Dose: 1 tablet  Commonly known as: PERICOLACE  For: Hold for diarrhea  Take 1 tablet by mouth  every 12 (twelve) hours     sertraline 50 MG tablet  Dose: 50 mg  Commonly known as: ZOLOFT  Take 1 tablet (50 mg total) by mouth daily     vitamin D 25 MCG (1000 UT) tablet  Dose: 25 mcg  Commonly known as: cholecalciferol  Take 1 tablet (25 mcg) by mouth daily            STOP taking these medications      aspirin 81 MG chewable tablet     calcium carbonate 600 MG Tabs tablet  Commonly known as: OS-CAL  Replaced by: calcium citrate-vitamin D 315-6.25 MG-MCG Tabs            Minutes spent coordinating discharge and reviewing discharge plan: 36 minutes      Signed by:  Cheral Almas NP-C                      TACS NP                     Ext. [phone]     Attending Attestation:   The above clinical findings, labs, imaging studies and management of Yolanda Cox have been discussed in detail w attending physician Barrie Dunker, M.D.     Cc:Ro, Saunders Glance, MD

## 2021-02-15 NOTE — Progress Notes (Signed)
CM Progress note:     D/C Disposition: [facility]  Anticipated D/C Date: 02/15/2021    Patient to discharge today to [facility] room 123 with report to [phone]. MMT set up with a 2:15 pm pickup time. Spoke with patient's daughter who is in agreement with discharge plan.     Lauro Franklin RN Case Manager  [facility]  [phone]        02/15/21 1324   Discharge Disposition   Patient preference/choice provided? Yes   Physical Discharge Disposition SNF   Receiving facility, unit and room number: [facility] room 123   Nursing report phone number: [phone]   Mode of Transportation Ambulance   Pick up time MMT with a 2pm pickup time   Patient/Family/POA notified of transfer plan Yes   Patient agreeable to discharge plan/expected d/c date? Yes   Family/POA agreeable to discharge plan/expected d/c date? Yes   Bedside nurse notified of transport plan? Yes   Medicare Checklist   Is this a Medicare patient? Yes   Medicaid/DMAS   Medicaid Pre-Screening completed Yes   DMAS 95 MI/MR completed and faxed Yes   DMAS 95 MI/MR trigger Level 2 Screening? No

## 2021-02-15 NOTE — Progress Notes (Signed)
Discharge performed report given to Yolanda Cox RPN at [facility] and all questions were answered. Pt was stable alert and oriented. Patient aid was bed side during discharge.  Pericare provided before discharge. PIV was removed. All Pt's belongings taken by patient aid. Patient picked up by [facility].

## 2022-12-31 DEATH — deceased
